# Patient Record
Sex: Male | Born: 1985 | Race: White | Hispanic: No | Marital: Single | Smoking: Former smoker
Health system: Southern US, Community
[De-identification: ages and names within clinical notes are randomized; demographics above are authoritative.]

## PROBLEM LIST (undated history)

## (undated) DIAGNOSIS — I609 Nontraumatic subarachnoid hemorrhage, unspecified: Secondary | ICD-10-CM

## (undated) DIAGNOSIS — S069X9A Unspecified intracranial injury with loss of consciousness of unspecified duration, initial encounter: Secondary | ICD-10-CM

## (undated) DIAGNOSIS — F32A Depression, unspecified: Secondary | ICD-10-CM

## (undated) DIAGNOSIS — D649 Anemia, unspecified: Secondary | ICD-10-CM

## (undated) DIAGNOSIS — R569 Unspecified convulsions: Secondary | ICD-10-CM

## (undated) DIAGNOSIS — M419 Scoliosis, unspecified: Secondary | ICD-10-CM

## (undated) DIAGNOSIS — B1920 Unspecified viral hepatitis C without hepatic coma: Secondary | ICD-10-CM

## (undated) DIAGNOSIS — F1910 Other psychoactive substance abuse, uncomplicated: Secondary | ICD-10-CM

## (undated) DIAGNOSIS — F419 Anxiety disorder, unspecified: Secondary | ICD-10-CM

## (undated) DIAGNOSIS — F329 Major depressive disorder, single episode, unspecified: Secondary | ICD-10-CM

## (undated) DIAGNOSIS — I2699 Other pulmonary embolism without acute cor pulmonale: Secondary | ICD-10-CM

## (undated) DIAGNOSIS — F29 Unspecified psychosis not due to a substance or known physiological condition: Secondary | ICD-10-CM

## (undated) DIAGNOSIS — A0472 Enterocolitis due to Clostridium difficile, not specified as recurrent: Secondary | ICD-10-CM

## (undated) HISTORY — DX: Depression, unspecified: F32.A

## (undated) HISTORY — PX: ANKLE SURGERY: SHX546

## (undated) HISTORY — PX: PEG TUBE PLACEMENT: SUR1034

## (undated) HISTORY — PX: ILEOSTOMY: SHX1783

## (undated) HISTORY — PX: IVC FILTER PLACEMENT (ARMC HX): HXRAD1551

## (undated) HISTORY — DX: Major depressive disorder, single episode, unspecified: F32.9

## (undated) HISTORY — PX: BRAIN SURGERY: SHX531

## (undated) HISTORY — PX: OTHER SURGICAL HISTORY: SHX169

## (undated) HISTORY — DX: Nontraumatic subarachnoid hemorrhage, unspecified: I60.9

## (undated) HISTORY — PX: TRACHEOSTOMY: SUR1362

## (undated) HISTORY — PX: FRACTURE SURGERY: SHX138

## (undated) HISTORY — DX: Other pulmonary embolism without acute cor pulmonale: I26.99

## (undated) HISTORY — DX: Unspecified intracranial injury with loss of consciousness of unspecified duration, initial encounter: S06.9X9A

---
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
## 2010-08-19 ENCOUNTER — Encounter: Payer: Self-pay | Admitting: Family Medicine

## 2011-05-23 ENCOUNTER — Emergency Department (HOSPITAL_BASED_OUTPATIENT_CLINIC_OR_DEPARTMENT_OTHER)
Admission: EM | Admit: 2011-05-23 | Discharge: 2011-05-24 | Disposition: A | Discharge status: Home / Self Care | Payer: Commercial Managed Care - PPO | Attending: Emergency Medicine | Admitting: Emergency Medicine

## 2011-05-23 ENCOUNTER — Encounter: Payer: Self-pay | Admitting: *Deleted

## 2011-05-23 ENCOUNTER — Emergency Department (INDEPENDENT_AMBULATORY_CARE_PROVIDER_SITE_OTHER): Payer: Commercial Managed Care - PPO

## 2011-05-23 DIAGNOSIS — Z79899 Other long term (current) drug therapy: Secondary | ICD-10-CM | POA: Insufficient documentation

## 2011-05-23 DIAGNOSIS — F17200 Nicotine dependence, unspecified, uncomplicated: Secondary | ICD-10-CM | POA: Insufficient documentation

## 2011-05-23 DIAGNOSIS — X58XXXA Exposure to other specified factors, initial encounter: Secondary | ICD-10-CM

## 2011-05-23 DIAGNOSIS — IMO0002 Reserved for concepts with insufficient information to code with codable children: Secondary | ICD-10-CM | POA: Insufficient documentation

## 2011-05-23 DIAGNOSIS — M25549 Pain in joints of unspecified hand: Secondary | ICD-10-CM

## 2011-05-23 HISTORY — DX: Scoliosis, unspecified: M41.9

## 2011-05-23 HISTORY — DX: Unspecified viral hepatitis C without hepatic coma: B19.20

## 2011-05-23 MED ORDER — LIDOCAINE HCL 2 % IJ SOLN
20.0000 mL | Freq: Once | INTRAMUSCULAR | Status: AC
  Administered 2011-05-23: 400 mg
  Filled 2011-05-23: qty 1

## 2011-05-23 NOTE — ED Provider Notes (Signed)
History     CSN: [id number] Arrival date & time: 05/23/2011 10:46 PM   First MD Initiated Contact with Patient 05/23/11 2258      Chief Complaint  Patient presents with  . Hand Pain    (Consider location/radiation/quality/duration/timing/severity/associated sxs/prior treatment) HPI Comments: Patient sustained a laceration to his right thumb 2 weeks ago and did not have it evaluated until last night at urgent care. He was given a shot of antibiotics and put on Tylenol and doxycycline. He states the pain got worse last night and is unable to tolerate it today. He denies any fevers, vomiting. His range of motion is limited secondary to pain. He denies any new injury.  The history is provided by the patient.    Past Medical History  Diagnosis Date  . Hepatitis C   . Scoliosis     History reviewed. No pertinent past surgical history.  No family history on file.  History  Substance Use Topics  . Smoking status: Current Everyday Smoker  . Smokeless tobacco: Not on file  . Alcohol Use: No      Review of Systems  Constitutional: Negative for activity change and appetite change.  HENT: Negative for congestion and rhinorrhea.   Respiratory: Negative for cough, chest tightness and shortness of breath.   Cardiovascular: Negative for chest pain.  Gastrointestinal: Negative for nausea, vomiting and abdominal pain.  Genitourinary: Negative for dysuria and hematuria.  Musculoskeletal: Positive for arthralgias. Negative for back pain.  Neurological: Negative for weakness and headaches.    Allergies  Review of patient's allergies indicates no known allergies.  Home Medications   Current Outpatient Rx  Name Route Sig Dispense Refill  . DOXYCYCLINE HYCLATE 100 MG PO TABS Oral Take 100 mg by mouth 2 (two) times daily.      Marland Kitchen GABAPENTIN 300 MG PO CAPS Oral Take 300 mg by mouth daily.      . IBUPROFEN 200 MG PO TABS Oral Take 600-800 mg by mouth 3 (three) times daily as needed. For  pain     . INTERFERON BETA-1A 30 MCG/0.5ML IM KIT Intramuscular Inject 30 mcg into the muscle every 7 (seven) days.      Marland Kitchen RIBAVIRIN 200 MG PO TABS Oral Take 600 mg by mouth 2 (two) times daily.      . TRAMADOL HCL 50 MG PO TABS Oral Take 50-100 mg by mouth 3 (three) times daily as needed. For pain. Maximum dose= 8 tablets per day     . OXYCODONE HCL 5 MG PO TABS Oral Take 1 tablet (5 mg total) by mouth every 4 (four) hours as needed for pain. 15 tablet 0    BP 131/74  Pulse 99  Temp(Src) 98.2 F (36.8 C) (Oral)  Resp 20  Ht 6' (1.829 m)  Wt 168 lb (76.204 kg)  BMI 22.78 kg/m2  SpO2 99%  Physical Exam  Constitutional: He is oriented to person, place, and time. He appears well-developed and well-nourished. No distress.  HENT:  Head: Normocephalic and atraumatic.  Mouth/Throat: No oropharyngeal exudate.  Eyes: Conjunctivae are normal. Pupils are equal, round, and reactive to light.  Neck: Normal range of motion.  Cardiovascular: Normal rate, regular rhythm and normal heart sounds.   Pulmonary/Chest: Effort normal and breath sounds normal. No respiratory distress.  Abdominal: Soft. There is no tenderness. There is no rebound and no guarding.  Musculoskeletal: Normal range of motion. He exhibits tenderness.       Pad of right thumb erythematous and tender  to palpation, to flex IP and MCP joint with pain. No appreciable fluctuance.   Neurological: He is alert and oriented to person, place, and time. No cranial nerve deficit.  Skin: Skin is warm.    ED Course  INCISION AND DRAINAGE Date/Time: 05/24/2011 12:27 AM Performed by: Glynn Octave Authorized by: Glynn Octave Consent: Verbal consent obtained. Risks and benefits: risks, benefits and alternatives were discussed Consent given by: patient Patient understanding: patient states understanding of the procedure being performed Site marked: the operative site was marked Patient identity confirmed: verbally with patient Time  out: Immediately prior to procedure a "time out" was called to verify the correct patient, procedure, equipment, support staff and site/side marked as required. Type: abscess Body area: upper extremity Location details: right thumb Anesthesia: digital block and local infiltration Local anesthetic: lidocaine 1% without epinephrine Anesthetic total: 10 ml Patient sedated: no Scalpel size: 11 Needle gauge: 22 Incision type: single straight Complexity: simple Drainage: purulent Drainage amount: moderate Wound treatment: wound left open Packing material: 1/4 in iodoform gauze Patient tolerance: Patient tolerated the procedure well with no immediate complications.   (including critical care time)  Labs Reviewed - No data to display Dg Hand Complete Right  05/23/2011  *RADIOLOGY REPORT*  Clinical Data: Trauma with pain.  RIGHT HAND - COMPLETE 3+ VIEW  Comparison: None.  Findings: No acute fracture or dislocation.  No significant soft tissue swelling.  IMPRESSION: Normal right hand.  Original Report Authenticated By: Consuello Bossier, M.D.     1. Felon       MDM  Right thumb pain and erythema with remote trauma. Concern for possible felon  Purulence expressed on I +D of felon.  Packing placed.  Instructed to continue antibiotics given at [facility].  Follow up with hand surgery prn.      Glynn Octave, MD 05/24/11 [phone]

## 2011-05-23 NOTE — ED Notes (Signed)
Pt reports he cut his right thumb 2 weeks ago - waited to see doctor but went last night and got a "shot" and was put on doxycycline and tramadol- c/o severe pain tonight

## 2011-05-24 MED ORDER — OXYCODONE HCL 5 MG PO TABS: 5.0000 mg | ORAL_TABLET | ORAL | Status: AC | PRN

## 2011-06-19 ENCOUNTER — Emergency Department (HOSPITAL_BASED_OUTPATIENT_CLINIC_OR_DEPARTMENT_OTHER)
Admission: EM | Admit: 2011-06-19 | Discharge: 2011-06-20 | Disposition: A | Discharge status: Home / Self Care | Payer: Commercial Managed Care - PPO | Attending: Emergency Medicine | Admitting: Emergency Medicine

## 2011-06-19 ENCOUNTER — Encounter (HOSPITAL_BASED_OUTPATIENT_CLINIC_OR_DEPARTMENT_OTHER): Payer: Self-pay | Admitting: *Deleted

## 2011-06-19 DIAGNOSIS — K047 Periapical abscess without sinus: Secondary | ICD-10-CM

## 2011-06-19 DIAGNOSIS — K044 Acute apical periodontitis of pulpal origin: Secondary | ICD-10-CM | POA: Insufficient documentation

## 2011-06-19 DIAGNOSIS — Z79899 Other long term (current) drug therapy: Secondary | ICD-10-CM | POA: Insufficient documentation

## 2011-06-19 DIAGNOSIS — IMO0002 Reserved for concepts with insufficient information to code with codable children: Secondary | ICD-10-CM | POA: Insufficient documentation

## 2011-06-19 DIAGNOSIS — L0291 Cutaneous abscess, unspecified: Secondary | ICD-10-CM

## 2011-06-19 NOTE — ED Notes (Signed)
Pt has an abscess under his left arm x2 days and is c/o dental pain.

## 2011-06-20 MED ORDER — OXYCODONE HCL 5 MG PO TABA: 5.0000 mg | ORAL_TABLET | Freq: Three times a day (TID) | ORAL | Status: DC | PRN

## 2011-06-20 MED ORDER — HYDROMORPHONE HCL PF 1 MG/ML IJ SOLN
1.0000 mg | Freq: Once | INTRAMUSCULAR | Status: DC
  Administered 2011-06-20 (×2): 1 mg via INTRAVENOUS
  Filled 2011-06-20: qty 1

## 2011-06-20 MED ORDER — OXYCODONE-ACETAMINOPHEN 5-325 MG PO TABS: 1.0000 | ORAL_TABLET | Freq: Four times a day (QID) | ORAL | Status: DC | PRN

## 2011-06-20 MED ORDER — HYDROMORPHONE HCL PF 2 MG/ML IJ SOLN: 2.0000 mg | Freq: Once | INTRAMUSCULAR | Status: DC

## 2011-06-20 MED ORDER — LIDOCAINE HCL (PF) 1 % IJ SOLN
INTRAMUSCULAR | Status: AC
  Administered 2011-06-20: 5 mL
  Filled 2011-06-20: qty 5

## 2011-06-20 MED ORDER — LIDOCAINE HCL (PF) 1 % IJ SOLN
5.0000 mL | Freq: Once | INTRAMUSCULAR | Status: AC
  Administered 2011-06-20: 5 mL

## 2011-06-20 MED ORDER — HYDROMORPHONE HCL PF 1 MG/ML IJ SOLN
INTRAMUSCULAR | Status: AC
  Administered 2011-06-20: 1 mg via INTRAVENOUS
  Filled 2011-06-20: qty 1

## 2011-06-20 NOTE — ED Notes (Addendum)
Original order was for 1mg  Dilaudid IM. While administering that order, the order was changed to 2mg  Dilaudid IM. So a second dose of 1mg  Dilaudid was given IM.

## 2011-06-20 NOTE — ED Notes (Signed)
MD at bedside. 

## 2011-06-20 NOTE — ED Provider Notes (Signed)
History     CSN: [id number] Arrival date & time: 06/19/2011 10:38 PM   First MD Initiated Contact with Patient 06/19/11 2332      Chief Complaint  Patient presents with  . Abscess  . Dental Pain    (Consider location/radiation/quality/duration/timing/severity/associated sxs/prior treatment) Patient is a 25 y.o. male presenting with abscess and tooth pain. The history is provided by the patient.  Abscess  This is a new problem. The current episode started less than one week ago. The onset was gradual. The problem occurs continuously. The problem has been gradually worsening. Affected Location: left axilla. The problem is moderate. The abscess is characterized by redness, painfulness and swelling. It is unknown what he was exposed to. The abscess first occurred at home. Pertinent negatives include no fever and no sore throat. There were no sick contacts. Recently, medical care has been given by the PCP. Services received include medications given (started on doxy yesterday).  Dental PainThe primary symptoms include mouth pain. Primary symptoms do not include oral bleeding, fever, shortness of breath or sore throat. The symptoms began 3 to 5 days ago. The symptoms are worsening. The symptoms are new. The symptoms occur constantly.  Additional symptoms include: dental sensitivity to temperature, gum swelling, gum tenderness and taste disturbance. Associated symptoms comments: Had tooth pulled and persistent swelling and pain since.    Past Medical History  Diagnosis Date  . Hepatitis C   . Scoliosis     History reviewed. No pertinent past surgical history.  No family history on file.  History  Substance Use Topics  . Smoking status: Current Everyday Smoker  . Smokeless tobacco: Not on file  . Alcohol Use: No      Review of Systems  Constitutional: Negative for fever.  HENT: Negative for sore throat.   Respiratory: Negative for shortness of breath.   All other systems  reviewed and are negative.    Allergies  Review of patient's allergies indicates no known allergies.  Home Medications   Current Outpatient Rx  Name Route Sig Dispense Refill  . CYANOCOBALAMIN 100 MCG PO TABS Oral Take 100 mcg by mouth daily.      Marland Kitchen DOXYCYCLINE HYCLATE 100 MG PO TABS Oral Take 100 mg by mouth 2 (two) times daily.      Marland Kitchen GABAPENTIN 300 MG PO CAPS Oral Take 300 mg by mouth daily.      . IBUPROFEN 200 MG PO TABS Oral Take 600-800 mg by mouth 3 (three) times daily as needed. For pain     . INTERFERON BETA-1A 30 MCG/0.5ML IM KIT Intramuscular Inject 30 mcg into the muscle every 7 (seven) days.      . OXYCODONE HCL (ABUSE DETER) 5 MG PO TABS Oral Take 5 mg by mouth 3 (three) times daily as needed (for pain). 20 tablet 0  . RIBAVIRIN 200 MG PO TABS Oral Take 600 mg by mouth 2 (two) times daily.      . TRAMADOL HCL 50 MG PO TABS Oral Take 50-100 mg by mouth 3 (three) times daily as needed. For pain. Maximum dose= 8 tablets per day       BP 132/74  Pulse 98  Temp(Src) 97.5 F (36.4 C) (Oral)  Resp 18  SpO2 100%  Physical Exam  Nursing note and vitals reviewed. Constitutional: He is oriented to person, place, and time. He appears well-developed and well-nourished. No distress.  HENT:  Head: Normocephalic and atraumatic.  Mouth/Throat: Oropharynx is clear and moist. Abnormal dentition.  No dental abscesses or uvula swelling.       Swelling of the right lower gums and jaw but no fluctuance or induration noted  Eyes: Conjunctivae and EOM are normal. Pupils are equal, round, and reactive to light.  Neck: Normal range of motion. Neck supple.  Cardiovascular: Normal rate, regular rhythm and intact distal pulses.   No murmur heard. Pulmonary/Chest: Effort normal and breath sounds normal. No respiratory distress. He has no wheezes. He has no rales.  Abdominal: Soft. He exhibits no distension. There is no tenderness. There is no rebound and no guarding.  Musculoskeletal: Normal  range of motion. He exhibits tenderness. He exhibits no edema.       Arms: Neurological: He is alert and oriented to person, place, and time.  Skin: Skin is warm and dry. No rash noted. No erythema.  Psychiatric: He has a normal mood and affect. His behavior is normal.    ED Course  Procedures (including critical care time)  Labs Reviewed - No data to display No results found.  INCISION AND DRAINAGE Performed by: Gwyneth Sprout Consent: Verbal consent obtained. Risks and benefits: risks, benefits and alternatives were discussed Type: abscess  Body area: left axilla  Anesthesia: local infiltration  Local anesthetic: lidocaine 1% without epinephrine  Anesthetic total: 4 ml  Complexity: complex Blunt dissection to break up loculations  Drainage: purulent  Drainage amount: 3mL  Packing material: 1/4 in iodoform gauze  Patient tolerance: Patient tolerated the procedure well with no immediate complications.     1. Abscess   2. Dental infection       MDM  Pt with 2 issues.  First is abscess in his axilla with surrounding erythema and drained as above.  Pt already on doxy which he started yesterday. Secondly had dental extraction 4 days ago with persistent pain and swelling of the jaw and gums and pain.  No signs of abscess and no drainage from site.  Will have continue doxy and f/u with dentist.        Gwyneth Sprout, MD 06/20/11 [phone]

## 2011-07-30 DIAGNOSIS — S069X9A Unspecified intracranial injury with loss of consciousness of unspecified duration, initial encounter: Secondary | ICD-10-CM

## 2011-07-30 DIAGNOSIS — S069XAA Unspecified intracranial injury with loss of consciousness status unknown, initial encounter: Secondary | ICD-10-CM

## 2011-07-30 HISTORY — DX: Unspecified intracranial injury with loss of consciousness status unknown, initial encounter: S06.9XAA

## 2011-07-30 HISTORY — DX: Unspecified intracranial injury with loss of consciousness of unspecified duration, initial encounter: S06.9X9A

## 2011-12-25 ENCOUNTER — Ambulatory Visit: Payer: BC Managed Care – PPO | Admitting: Speech Pathology

## 2011-12-25 ENCOUNTER — Ambulatory Visit: Payer: BC Managed Care – PPO | Attending: Physical Medicine and Rehabilitation | Admitting: Occupational Therapy

## 2011-12-25 ENCOUNTER — Ambulatory Visit: Payer: BC Managed Care – PPO | Admitting: Physical Therapy

## 2011-12-25 DIAGNOSIS — R41841 Cognitive communication deficit: Secondary | ICD-10-CM | POA: Insufficient documentation

## 2011-12-25 DIAGNOSIS — M629 Disorder of muscle, unspecified: Secondary | ICD-10-CM | POA: Insufficient documentation

## 2011-12-25 DIAGNOSIS — R490 Dysphonia: Secondary | ICD-10-CM | POA: Insufficient documentation

## 2011-12-25 DIAGNOSIS — R1312 Dysphagia, oropharyngeal phase: Secondary | ICD-10-CM | POA: Insufficient documentation

## 2011-12-25 DIAGNOSIS — M2560 Stiffness of unspecified joint, not elsewhere classified: Secondary | ICD-10-CM | POA: Insufficient documentation

## 2011-12-25 DIAGNOSIS — M2420 Disorder of ligament, unspecified site: Secondary | ICD-10-CM | POA: Insufficient documentation

## 2011-12-25 DIAGNOSIS — M2550 Pain in unspecified joint: Secondary | ICD-10-CM | POA: Insufficient documentation

## 2011-12-25 DIAGNOSIS — Z5189 Encounter for other specified aftercare: Secondary | ICD-10-CM | POA: Insufficient documentation

## 2011-12-30 ENCOUNTER — Encounter: Payer: Self-pay | Admitting: Physical Medicine & Rehabilitation

## 2012-01-01 ENCOUNTER — Encounter: Payer: BC Managed Care – PPO | Admitting: Occupational Therapy

## 2012-01-01 ENCOUNTER — Encounter: Payer: BC Managed Care – PPO | Admitting: Speech Pathology

## 2012-01-01 ENCOUNTER — Ambulatory Visit: Payer: BC Managed Care – PPO | Admitting: Physical Therapy

## 2012-01-03 ENCOUNTER — Ambulatory Visit: Payer: BC Managed Care – PPO | Admitting: Physical Therapy

## 2012-01-03 ENCOUNTER — Encounter: Payer: BC Managed Care – PPO | Admitting: Occupational Therapy

## 2012-01-08 ENCOUNTER — Ambulatory Visit: Payer: BC Managed Care – PPO | Admitting: Occupational Therapy

## 2012-01-08 ENCOUNTER — Encounter: Payer: BC Managed Care – PPO | Admitting: Occupational Therapy

## 2012-01-08 ENCOUNTER — Encounter: Payer: BC Managed Care – PPO | Admitting: Speech Pathology

## 2012-01-08 ENCOUNTER — Ambulatory Visit: Payer: BC Managed Care – PPO | Admitting: Physical Therapy

## 2012-01-08 ENCOUNTER — Ambulatory Visit: Payer: BC Managed Care – PPO | Attending: Physical Medicine and Rehabilitation | Admitting: Speech Pathology

## 2012-01-08 DIAGNOSIS — R1312 Dysphagia, oropharyngeal phase: Secondary | ICD-10-CM | POA: Insufficient documentation

## 2012-01-08 DIAGNOSIS — M2420 Disorder of ligament, unspecified site: Secondary | ICD-10-CM | POA: Insufficient documentation

## 2012-01-08 DIAGNOSIS — R41841 Cognitive communication deficit: Secondary | ICD-10-CM | POA: Insufficient documentation

## 2012-01-08 DIAGNOSIS — M2560 Stiffness of unspecified joint, not elsewhere classified: Secondary | ICD-10-CM | POA: Insufficient documentation

## 2012-01-08 DIAGNOSIS — M629 Disorder of muscle, unspecified: Secondary | ICD-10-CM | POA: Insufficient documentation

## 2012-01-08 DIAGNOSIS — R490 Dysphonia: Secondary | ICD-10-CM | POA: Insufficient documentation

## 2012-01-08 DIAGNOSIS — Z5189 Encounter for other specified aftercare: Secondary | ICD-10-CM | POA: Insufficient documentation

## 2012-01-08 DIAGNOSIS — M2550 Pain in unspecified joint: Secondary | ICD-10-CM | POA: Insufficient documentation

## 2012-01-10 ENCOUNTER — Ambulatory Visit: Payer: BC Managed Care – PPO

## 2012-01-10 ENCOUNTER — Ambulatory Visit: Payer: BC Managed Care – PPO | Admitting: Rehabilitative and Restorative Service Providers"

## 2012-01-10 ENCOUNTER — Encounter: Payer: BC Managed Care – PPO | Admitting: Occupational Therapy

## 2012-01-14 ENCOUNTER — Ambulatory Visit: Payer: BC Managed Care – PPO | Admitting: Physical Therapy

## 2012-01-14 ENCOUNTER — Ambulatory Visit: Payer: BC Managed Care – PPO | Admitting: Occupational Therapy

## 2012-01-14 ENCOUNTER — Ambulatory Visit: Payer: BC Managed Care – PPO

## 2012-01-16 ENCOUNTER — Ambulatory Visit: Payer: BC Managed Care – PPO | Admitting: Physical Therapy

## 2012-01-16 ENCOUNTER — Ambulatory Visit: Payer: BC Managed Care – PPO | Admitting: Occupational Therapy

## 2012-01-21 ENCOUNTER — Ambulatory Visit: Payer: BC Managed Care – PPO | Admitting: Occupational Therapy

## 2012-01-21 ENCOUNTER — Ambulatory Visit: Payer: BC Managed Care – PPO | Admitting: Speech Pathology

## 2012-01-21 ENCOUNTER — Ambulatory Visit: Payer: BC Managed Care – PPO | Admitting: Physical Therapy

## 2012-01-23 ENCOUNTER — Ambulatory Visit: Payer: BC Managed Care – PPO | Admitting: Physical Therapy

## 2012-01-23 ENCOUNTER — Ambulatory Visit: Payer: BC Managed Care – PPO | Admitting: Occupational Therapy

## 2012-01-23 ENCOUNTER — Ambulatory Visit: Payer: BC Managed Care – PPO | Admitting: Speech Pathology

## 2012-01-27 ENCOUNTER — Ambulatory Visit: Payer: BC Managed Care – PPO | Admitting: Speech Pathology

## 2012-01-27 ENCOUNTER — Ambulatory Visit: Payer: BC Managed Care – PPO | Attending: Physical Medicine and Rehabilitation | Admitting: Occupational Therapy

## 2012-01-27 ENCOUNTER — Ambulatory Visit: Payer: BC Managed Care – PPO | Admitting: Physical Therapy

## 2012-01-27 DIAGNOSIS — Z5189 Encounter for other specified aftercare: Secondary | ICD-10-CM | POA: Insufficient documentation

## 2012-01-27 DIAGNOSIS — R41841 Cognitive communication deficit: Secondary | ICD-10-CM | POA: Insufficient documentation

## 2012-01-27 DIAGNOSIS — R490 Dysphonia: Secondary | ICD-10-CM | POA: Insufficient documentation

## 2012-01-27 DIAGNOSIS — M2420 Disorder of ligament, unspecified site: Secondary | ICD-10-CM | POA: Insufficient documentation

## 2012-01-27 DIAGNOSIS — M2550 Pain in unspecified joint: Secondary | ICD-10-CM | POA: Insufficient documentation

## 2012-01-27 DIAGNOSIS — M2560 Stiffness of unspecified joint, not elsewhere classified: Secondary | ICD-10-CM | POA: Insufficient documentation

## 2012-01-27 DIAGNOSIS — M629 Disorder of muscle, unspecified: Secondary | ICD-10-CM | POA: Insufficient documentation

## 2012-01-27 DIAGNOSIS — R1312 Dysphagia, oropharyngeal phase: Secondary | ICD-10-CM | POA: Insufficient documentation

## 2012-01-31 ENCOUNTER — Ambulatory Visit: Payer: BC Managed Care – PPO | Admitting: Physical Therapy

## 2012-01-31 ENCOUNTER — Ambulatory Visit: Payer: BC Managed Care – PPO | Admitting: Occupational Therapy

## 2012-01-31 ENCOUNTER — Ambulatory Visit: Payer: BC Managed Care – PPO

## 2012-02-03 ENCOUNTER — Encounter: Payer: Self-pay | Admitting: Physical Medicine and Rehabilitation

## 2012-02-04 ENCOUNTER — Encounter: Payer: Self-pay | Admitting: Physical Medicine & Rehabilitation

## 2012-02-04 ENCOUNTER — Encounter
Payer: BC Managed Care – PPO | Attending: Physical Medicine & Rehabilitation | Admitting: Physical Medicine & Rehabilitation

## 2012-02-04 VITALS — BP 131/68 | HR 86 | Resp 16 | Ht 76.0 in | Wt 165.0 lb

## 2012-02-04 DIAGNOSIS — S069X9A Unspecified intracranial injury with loss of consciousness of unspecified duration, initial encounter: Secondary | ICD-10-CM

## 2012-02-04 DIAGNOSIS — G8250 Quadriplegia, unspecified: Secondary | ICD-10-CM | POA: Insufficient documentation

## 2012-02-04 DIAGNOSIS — M948X9 Other specified disorders of cartilage, unspecified sites: Secondary | ICD-10-CM

## 2012-02-04 DIAGNOSIS — S069XAA Unspecified intracranial injury with loss of consciousness status unknown, initial encounter: Secondary | ICD-10-CM | POA: Insufficient documentation

## 2012-02-04 DIAGNOSIS — Q068 Other specified congenital malformations of spinal cord: Secondary | ICD-10-CM | POA: Insufficient documentation

## 2012-02-04 DIAGNOSIS — M898X9 Other specified disorders of bone, unspecified site: Secondary | ICD-10-CM

## 2012-02-04 DIAGNOSIS — S069X0A Unspecified intracranial injury without loss of consciousness, initial encounter: Secondary | ICD-10-CM | POA: Insufficient documentation

## 2012-02-04 DIAGNOSIS — K592 Neurogenic bowel, not elsewhere classified: Secondary | ICD-10-CM | POA: Insufficient documentation

## 2012-02-04 DIAGNOSIS — X58XXXA Exposure to other specified factors, initial encounter: Secondary | ICD-10-CM | POA: Insufficient documentation

## 2012-02-04 DIAGNOSIS — N319 Neuromuscular dysfunction of bladder, unspecified: Secondary | ICD-10-CM | POA: Insufficient documentation

## 2012-02-04 MED ORDER — PREGABALIN 75 MG PO CAPS: 75.0000 mg | ORAL_CAPSULE | Freq: Three times a day (TID) | ORAL | Status: DC

## 2012-02-04 MED ORDER — NORTRIPTYLINE HCL 50 MG PO CAPS: 100.0000 mg | ORAL_CAPSULE | Freq: Every day | ORAL | Status: DC

## 2012-02-04 NOTE — Patient Instructions (Signed)
Continue with therapies as directed
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
## 2012-02-27 ENCOUNTER — Encounter: Payer: Self-pay | Admitting: Physical Medicine and Rehabilitation

## 2012-03-04 ENCOUNTER — Encounter: Payer: BC Managed Care – PPO | Admitting: Physical Medicine & Rehabilitation

## 2012-04-01 ENCOUNTER — Encounter: Payer: BC Managed Care – PPO | Admitting: Physical Medicine & Rehabilitation

## 2012-11-16 ENCOUNTER — Emergency Department: Payer: Self-pay | Admitting: Emergency Medicine

## 2012-11-20 ENCOUNTER — Emergency Department: Payer: Self-pay | Admitting: Emergency Medicine

## 2012-11-20 LAB — BASIC METABOLIC PANEL
Anion Gap: 5 — ABNORMAL LOW (ref 7–16)
BUN: 20 mg/dL — ABNORMAL HIGH (ref 7–18)
Chloride: 103 mmol/L (ref 98–107)
EGFR (African American): >60
EGFR (Non-African Amer.): >60
Potassium: 4 mmol/L (ref 3.5–5.1)
Sodium: 139 mmol/L (ref 136–145)

## 2012-11-20 LAB — URINALYSIS, COMPLETE
Glucose,UR: NEGATIVE mg/dL (ref 0–75)
Ketone: NEGATIVE
Nitrite: NEGATIVE
Ph: 6 (ref 4.5–8.0)
RBC,UR: 62 /HPF (ref 0–5)
Squamous Epithelial: <1
WBC UR: 5 /HPF (ref 0–5)

## 2012-11-20 LAB — CBC
HCT: 42.2 % (ref 40.0–52.0)
HGB: 13.6 g/dL (ref 13.0–18.0)
MCH: 27.8 pg (ref 26.0–34.0)
MCV: 86 fL (ref 80–100)
Platelet: 208 10*3/uL (ref 150–440)

## 2012-11-22 ENCOUNTER — Emergency Department: Payer: Self-pay | Admitting: Emergency Medicine

## 2012-11-29 ENCOUNTER — Emergency Department: Payer: Self-pay | Admitting: Internal Medicine

## 2012-11-29 LAB — URINALYSIS, COMPLETE
Bacteria: NONE SEEN
Glucose,UR: NEGATIVE mg/dL (ref 0–75)
Nitrite: NEGATIVE
Ph: 6 (ref 4.5–8.0)
Protein: NEGATIVE
RBC,UR: 26 /HPF (ref 0–5)
Specific Gravity: 1.012 (ref 1.003–1.030)
Squamous Epithelial: NONE SEEN

## 2012-11-29 LAB — BASIC METABOLIC PANEL
Anion Gap: 5 — ABNORMAL LOW (ref 7–16)
BUN: 21 mg/dL — ABNORMAL HIGH (ref 7–18)
Chloride: 103 mmol/L (ref 98–107)
Co2: 29 mmol/L (ref 21–32)
Creatinine: 0.62 mg/dL (ref 0.60–1.30)
EGFR (African American): >60
Glucose: 83 mg/dL (ref 65–99)
Sodium: 137 mmol/L (ref 136–145)

## 2012-11-29 LAB — CBC
HCT: 41.6 % (ref 40.0–52.0)
MCHC: 34 g/dL (ref 32.0–36.0)
MCV: 83 fL (ref 80–100)
Platelet: 294 10*3/uL (ref 150–440)

## 2012-11-30 ENCOUNTER — Emergency Department: Payer: Self-pay | Admitting: Emergency Medicine

## 2012-11-30 LAB — CBC
HCT: 44.5 % (ref 40.0–52.0)
MCH: 27.9 pg (ref 26.0–34.0)
MCHC: 33.3 g/dL (ref 32.0–36.0)
Platelet: 264 10*3/uL (ref 150–440)
RBC: 5.31 10*6/uL (ref 4.40–5.90)
RDW: 14.7 % — ABNORMAL HIGH (ref 11.5–14.5)

## 2012-11-30 LAB — CK: CK, Total: 165 U/L (ref 35–232)

## 2012-11-30 LAB — URINALYSIS, COMPLETE
Bilirubin,UR: NEGATIVE
Nitrite: POSITIVE
Ph: 6 (ref 4.5–8.0)
Protein: NEGATIVE
Specific Gravity: 1.008 (ref 1.003–1.030)
Squamous Epithelial: NONE SEEN

## 2012-11-30 LAB — COMPREHENSIVE METABOLIC PANEL
Albumin: 4.2 g/dL (ref 3.4–5.0)
Anion Gap: 5 — ABNORMAL LOW (ref 7–16)
BUN: 21 mg/dL — ABNORMAL HIGH (ref 7–18)
Bilirubin,Total: 0.6 mg/dL (ref 0.2–1.0)
Calcium, Total: 9.4 mg/dL (ref 8.5–10.1)
Co2: 29 mmol/L (ref 21–32)
Creatinine: 0.61 mg/dL (ref 0.60–1.30)
EGFR (African American): >60
EGFR (Non-African Amer.): >60
Glucose: 91 mg/dL (ref 65–99)
Potassium: 4.5 mmol/L (ref 3.5–5.1)
SGPT (ALT): 43 U/L (ref 12–78)
Sodium: 137 mmol/L (ref 136–145)
Total Protein: 8.5 g/dL — ABNORMAL HIGH (ref 6.4–8.2)

## 2012-12-02 LAB — URINE CULTURE

## 2012-12-06 LAB — CULTURE, BLOOD (SINGLE)

## 2012-12-20 ENCOUNTER — Emergency Department: Payer: Self-pay | Admitting: Emergency Medicine

## 2013-01-07 ENCOUNTER — Emergency Department: Payer: Self-pay | Admitting: Emergency Medicine

## 2013-02-25 ENCOUNTER — Other Ambulatory Visit: Payer: Self-pay | Admitting: Physical Medicine & Rehabilitation

## 2013-03-27 ENCOUNTER — Emergency Department: Payer: Self-pay | Admitting: Emergency Medicine

## 2013-03-28 LAB — COMPREHENSIVE METABOLIC PANEL
Albumin: 3.7 g/dL (ref 3.4–5.0)
Chloride: 105 mmol/L (ref 98–107)
Co2: 32 mmol/L (ref 21–32)
Glucose: 101 mg/dL — ABNORMAL HIGH (ref 65–99)
Osmolality: 280 (ref 275–301)
SGOT(AST): 16 U/L (ref 15–37)
SGPT (ALT): 13 U/L (ref 12–78)
Sodium: 139 mmol/L (ref 136–145)
Total Protein: 8 g/dL (ref 6.4–8.2)

## 2013-03-28 LAB — URINALYSIS, COMPLETE
Bacteria: NONE SEEN
Bilirubin,UR: NEGATIVE
Glucose,UR: NEGATIVE mg/dL (ref 0–75)
RBC,UR: 10 /HPF (ref 0–5)
Specific Gravity: 1.026 (ref 1.003–1.030)
Squamous Epithelial: NONE SEEN
WBC UR: 10 /HPF (ref 0–5)

## 2013-03-28 LAB — MAGNESIUM: Magnesium: 1.7 mg/dL — ABNORMAL LOW

## 2013-03-28 LAB — PROTIME-INR: INR: 1

## 2013-03-28 LAB — TROPONIN I: Troponin-I: <0.02 ng/mL

## 2013-03-28 LAB — CBC WITH DIFFERENTIAL/PLATELET
Basophil %: 0.7 %
Eosinophil #: 0.1 10*3/uL (ref 0.0–0.7)
Eosinophil %: 1.4 %
Lymphocyte %: 13.4 %
MCHC: 33.6 g/dL (ref 32.0–36.0)
Monocyte %: 4.8 %
Neutrophil #: 8.1 10*3/uL — ABNORMAL HIGH (ref 1.4–6.5)
RBC: 4.64 10*6/uL (ref 4.40–5.90)
WBC: 10.1 10*3/uL (ref 3.8–10.6)

## 2013-03-28 LAB — PHOSPHORUS: Phosphorus: 3.5 mg/dL (ref 2.5–4.9)

## 2013-03-28 LAB — TSH: Thyroid Stimulating Horm: 1.84 u[IU]/mL

## 2013-03-29 ENCOUNTER — Observation Stay: Payer: Self-pay | Admitting: Internal Medicine

## 2013-03-29 ENCOUNTER — Ambulatory Visit: Payer: Self-pay | Admitting: Internal Medicine

## 2013-04-02 LAB — CULTURE, BLOOD (SINGLE)

## 2013-04-03 LAB — CBC WITH DIFFERENTIAL/PLATELET
Basophil #: 0.1 10*3/uL (ref 0.0–0.1)
Eosinophil %: 4.2 %
HCT: 44.3 % (ref 40.0–52.0)
HGB: 14.5 g/dL (ref 13.0–18.0)
Lymphocyte #: 2.3 10*3/uL (ref 1.0–3.6)
Lymphocyte %: 35.6 %
MCH: 28 pg (ref 26.0–34.0)
MCV: 85 fL (ref 80–100)
Neutrophil %: 51.1 %
RBC: 5.2 10*6/uL (ref 4.40–5.90)
RDW: 15.3 % — ABNORMAL HIGH (ref 11.5–14.5)
WBC: 6.5 10*3/uL (ref 3.8–10.6)

## 2013-04-03 LAB — BASIC METABOLIC PANEL
Anion Gap: 4 — ABNORMAL LOW (ref 7–16)
Chloride: 104 mmol/L (ref 98–107)
Co2: 31 mmol/L (ref 21–32)
EGFR (African American): >60
Glucose: 89 mg/dL (ref 65–99)
Potassium: 4.1 mmol/L (ref 3.5–5.1)
Sodium: 139 mmol/L (ref 136–145)

## 2013-04-05 LAB — PROTIME-INR
INR: 0.9
Prothrombin Time: 12.1 s

## 2013-04-06 LAB — BASIC METABOLIC PANEL
BUN: 17 mg/dL (ref 7–18)
Calcium, Total: 8.7 mg/dL (ref 8.5–10.1)
Co2: 31 mmol/L (ref 21–32)
EGFR (Non-African Amer.): >60
Osmolality: 278 (ref 275–301)

## 2013-04-07 LAB — COMPREHENSIVE METABOLIC PANEL
Albumin: 3.1 g/dL — ABNORMAL LOW (ref 3.4–5.0)
Alkaline Phosphatase: 88 U/L (ref 50–136)
BUN: 16 mg/dL (ref 7–18)
Bilirubin,Total: 0.4 mg/dL (ref 0.2–1.0)
Calcium, Total: 8.8 mg/dL (ref 8.5–10.1)
Chloride: 107 mmol/L (ref 98–107)
Co2: 26 mmol/L (ref 21–32)
EGFR (Non-African Amer.): >60
Glucose: 87 mg/dL (ref 65–99)
Osmolality: 276 (ref 275–301)
SGOT(AST): 32 U/L (ref 15–37)
SGPT (ALT): 13 U/L (ref 12–78)
Sodium: 138 mmol/L (ref 136–145)
Total Protein: 7.5 g/dL (ref 6.4–8.2)

## 2013-04-07 LAB — CBC WITH DIFFERENTIAL/PLATELET
Basophil #: 0.1 10*3/uL (ref 0.0–0.1)
Eosinophil #: 0.1 10*3/uL (ref 0.0–0.7)
Eosinophil %: 1.6 %
HCT: 40.4 % (ref 40.0–52.0)
Lymphocyte #: 2.4 10*3/uL (ref 1.0–3.6)
Lymphocyte %: 32.8 %
MCHC: 32.8 g/dL (ref 32.0–36.0)
MCV: 85 fL (ref 80–100)
Monocyte #: 0.9 x10 3/mm (ref 0.2–1.0)
Neutrophil #: 3.8 10*3/uL (ref 1.4–6.5)
Neutrophil %: 52.6 %
Platelet: 165 10*3/uL (ref 150–440)
WBC: 7.3 10*3/uL (ref 3.8–10.6)

## 2013-04-21 LAB — PROTIME-INR
INR: 1.1
Prothrombin Time: 14.3 secs (ref 11.5–14.7)

## 2013-04-21 LAB — COMPREHENSIVE METABOLIC PANEL
Anion Gap: 4 — ABNORMAL LOW (ref 7–16)
BUN: 17 mg/dL (ref 7–18)
Bilirubin,Total: 0.3 mg/dL (ref 0.2–1.0)
Creatinine: 0.64 mg/dL (ref 0.60–1.30)
EGFR (Non-African Amer.): >60
Glucose: 87 mg/dL (ref 65–99)
SGPT (ALT): 10 U/L — ABNORMAL LOW (ref 12–78)
Sodium: 139 mmol/L (ref 136–145)
Total Protein: 7.7 g/dL (ref 6.4–8.2)

## 2013-04-21 LAB — URINALYSIS, COMPLETE
Bilirubin,UR: NEGATIVE
Blood: NEGATIVE
Glucose,UR: NEGATIVE mg/dL (ref 0–75)
Leukocyte Esterase: NEGATIVE
Nitrite: NEGATIVE
RBC,UR: 11 /HPF (ref 0–5)
Squamous Epithelial: 1

## 2013-04-21 LAB — CBC WITH DIFFERENTIAL/PLATELET
Basophil #: 0.1 10*3/uL (ref 0.0–0.1)
Eosinophil #: 0.2 10*3/uL (ref 0.0–0.7)
HCT: 35.1 % — ABNORMAL LOW (ref 40.0–52.0)
HGB: 11.6 g/dL — ABNORMAL LOW (ref 13.0–18.0)
Lymphocyte #: 2 10*3/uL (ref 1.0–3.6)
Lymphocyte %: 23.5 %
MCH: 27.9 pg (ref 26.0–34.0)
MCHC: 33 g/dL (ref 32.0–36.0)
MCV: 85 fL (ref 80–100)
Neutrophil #: 5.4 10*3/uL (ref 1.4–6.5)
Neutrophil %: 62.9 %
Platelet: 305 10*3/uL (ref 150–440)
RDW: 15.8 % — ABNORMAL HIGH (ref 11.5–14.5)
WBC: 8.6 10*3/uL (ref 3.8–10.6)

## 2013-04-22 ENCOUNTER — Inpatient Hospital Stay: Payer: Self-pay | Admitting: Student

## 2013-04-24 LAB — URINALYSIS, COMPLETE
Bilirubin,UR: NEGATIVE
Blood: NEGATIVE
Ketone: NEGATIVE
Leukocyte Esterase: NEGATIVE
Nitrite: NEGATIVE
RBC,UR: NONE SEEN /HPF (ref 0–5)
Specific Gravity: 1.009 (ref 1.003–1.030)
Squamous Epithelial: NONE SEEN
WBC UR: NONE SEEN /HPF (ref 0–5)

## 2013-04-24 LAB — WBC: WBC: 8.5 10*3/uL (ref 3.8–10.6)

## 2013-04-28 ENCOUNTER — Ambulatory Visit: Payer: Self-pay | Admitting: Internal Medicine

## 2013-05-13 ENCOUNTER — Other Ambulatory Visit: Payer: Self-pay | Admitting: Family Medicine

## 2013-05-13 LAB — URINALYSIS, COMPLETE
Glucose,UR: NEGATIVE mg/dL (ref 0–75)
Hyaline Cast: 3
Nitrite: POSITIVE
Protein: 30
RBC,UR: 383 /HPF (ref 0–5)
Squamous Epithelial: 1
WBC UR: 60 /HPF (ref 0–5)

## 2013-05-13 LAB — CBC WITH DIFFERENTIAL/PLATELET
Basophil #: 0.1 10*3/uL (ref 0.0–0.1)
Eosinophil %: 3.3 %
HGB: 13.8 g/dL (ref 13.0–18.0)
Lymphocyte #: 2 10*3/uL (ref 1.0–3.6)
MCH: 27.7 pg (ref 26.0–34.0)
MCV: 84 fL (ref 80–100)
Monocyte #: 0.6 x10 3/mm (ref 0.2–1.0)
Neutrophil #: 3.9 10*3/uL (ref 1.4–6.5)
Neutrophil %: 57.3 %
RBC: 5 10*6/uL (ref 4.40–5.90)
WBC: 6.8 10*3/uL (ref 3.8–10.6)

## 2013-05-14 LAB — URINE CULTURE

## 2013-06-06 ENCOUNTER — Emergency Department: Payer: Self-pay | Admitting: Emergency Medicine

## 2013-06-06 LAB — COMPREHENSIVE METABOLIC PANEL
Albumin: 3.9 g/dL (ref 3.4–5.0)
Alkaline Phosphatase: 95 U/L (ref 50–136)
Anion Gap: 5 — ABNORMAL LOW (ref 7–16)
BUN: 20 mg/dL — ABNORMAL HIGH (ref 7–18)
Calcium, Total: 9 mg/dL (ref 8.5–10.1)
Chloride: 103 mmol/L (ref 98–107)
EGFR (African American): >60
EGFR (Non-African Amer.): >60
Glucose: 123 mg/dL — ABNORMAL HIGH (ref 65–99)
Potassium: 4.1 mmol/L (ref 3.5–5.1)
SGOT(AST): 20 U/L (ref 15–37)
Total Protein: 7.9 g/dL (ref 6.4–8.2)

## 2013-06-06 LAB — URINALYSIS, COMPLETE
Leukocyte Esterase: NEGATIVE
RBC,UR: 6 /HPF (ref 0–5)
WBC UR: 3 /HPF (ref 0–5)

## 2013-06-06 LAB — CBC
MCV: 84 fL (ref 80–100)
Platelet: 264 10*3/uL (ref 150–440)
RBC: 5.04 10*6/uL (ref 4.40–5.90)
RDW: 16.7 % — ABNORMAL HIGH (ref 11.5–14.5)

## 2013-06-16 LAB — CBC WITH DIFFERENTIAL/PLATELET
Eosinophil #: 0.1 10*3/uL (ref 0.0–0.7)
HCT: 40.2 % (ref 40.0–52.0)
HGB: 13.1 g/dL (ref 13.0–18.0)
Lymphocyte #: 1.4 10*3/uL (ref 1.0–3.6)
Lymphocyte %: 20.7 %
MCH: 27.3 pg (ref 26.0–34.0)
Monocyte %: 8.9 %
Neutrophil #: 4.7 10*3/uL (ref 1.4–6.5)
Neutrophil %: 67.5 %
Platelet: 241 10*3/uL (ref 150–440)
RDW: 16.7 % — ABNORMAL HIGH (ref 11.5–14.5)
WBC: 6.9 10*3/uL (ref 3.8–10.6)

## 2013-06-16 LAB — COMPREHENSIVE METABOLIC PANEL
Alkaline Phosphatase: 104 U/L (ref 50–136)
Anion Gap: 7 (ref 7–16)
Bilirubin,Total: 0.2 mg/dL (ref 0.2–1.0)
Calcium, Total: 9.1 mg/dL (ref 8.5–10.1)
Chloride: 102 mmol/L (ref 98–107)
EGFR (Non-African Amer.): >60
Osmolality: 274 (ref 275–301)
Potassium: 4 mmol/L (ref 3.5–5.1)
SGOT(AST): 16 U/L (ref 15–37)
SGPT (ALT): 11 U/L — ABNORMAL LOW (ref 12–78)
Sodium: 136 mmol/L (ref 136–145)

## 2013-06-16 LAB — URINALYSIS, COMPLETE
Glucose,UR: NEGATIVE mg/dL (ref 0–75)
RBC,UR: 1032 /HPF (ref 0–5)

## 2013-06-16 LAB — PROTIME-INR
INR: 1.5
Prothrombin Time: 18.2 secs — ABNORMAL HIGH (ref 11.5–14.7)

## 2013-06-17 ENCOUNTER — Inpatient Hospital Stay: Payer: Self-pay | Admitting: Internal Medicine

## 2013-06-18 LAB — BASIC METABOLIC PANEL
Anion Gap: 5 — ABNORMAL LOW (ref 7–16)
Calcium, Total: 9.2 mg/dL (ref 8.5–10.1)
Chloride: 100 mmol/L (ref 98–107)
Creatinine: 0.69 mg/dL (ref 0.60–1.30)
EGFR (African American): >60
Glucose: 96 mg/dL (ref 65–99)
Osmolality: 274 (ref 275–301)
Potassium: 3.8 mmol/L (ref 3.5–5.1)
Sodium: 137 mmol/L (ref 136–145)

## 2013-06-20 LAB — CBC WITH DIFFERENTIAL/PLATELET
Basophil %: 0.9 %
Eosinophil #: 0.1 10*3/uL (ref 0.0–0.7)
Eosinophil %: 1.4 %
HCT: 40.6 % (ref 40.0–52.0)
Lymphocyte #: 2.2 10*3/uL (ref 1.0–3.6)
Lymphocyte %: 39.4 %
MCH: 27.4 pg (ref 26.0–34.0)
MCHC: 32.9 g/dL (ref 32.0–36.0)
MCV: 83 fL (ref 80–100)
Monocyte #: 0.7 x10 3/mm (ref 0.2–1.0)
Neutrophil %: 45.7 %
Platelet: 215 10*3/uL (ref 150–440)
WBC: 5.7 10*3/uL (ref 3.8–10.6)

## 2013-06-20 LAB — BASIC METABOLIC PANEL
Anion Gap: 5 — ABNORMAL LOW (ref 7–16)
BUN: 19 mg/dL — ABNORMAL HIGH (ref 7–18)
Calcium, Total: 9.1 mg/dL (ref 8.5–10.1)
Co2: 30 mmol/L (ref 21–32)
Creatinine: 0.82 mg/dL (ref 0.60–1.30)
EGFR (Non-African Amer.): >60
Glucose: 82 mg/dL (ref 65–99)
Osmolality: 270 (ref 275–301)
Sodium: 134 mmol/L — ABNORMAL LOW (ref 136–145)

## 2013-06-21 LAB — CULTURE, BLOOD (SINGLE)

## 2013-06-30 ENCOUNTER — Inpatient Hospital Stay: Payer: Self-pay | Admitting: Internal Medicine

## 2013-06-30 LAB — COMPREHENSIVE METABOLIC PANEL
Albumin: 4.2 g/dL (ref 3.4–5.0)
Alkaline Phosphatase: 104 U/L
Anion Gap: 8 (ref 7–16)
BUN: 15 mg/dL (ref 7–18)
Calcium, Total: 9.4 mg/dL (ref 8.5–10.1)
Chloride: 101 mmol/L (ref 98–107)
Co2: 30 mmol/L (ref 21–32)
Creatinine: 0.73 mg/dL (ref 0.60–1.30)
EGFR (African American): >60
EGFR (Non-African Amer.): >60
SGOT(AST): 14 U/L — ABNORMAL LOW (ref 15–37)
SGPT (ALT): 11 U/L — ABNORMAL LOW (ref 12–78)

## 2013-06-30 LAB — URINALYSIS, COMPLETE
Ph: 7 (ref 4.5–8.0)
Protein: NEGATIVE
RBC,UR: 185 /HPF (ref 0–5)
Specific Gravity: 1.013 (ref 1.003–1.030)
WBC UR: 22 /HPF (ref 0–5)

## 2013-06-30 LAB — CBC WITH DIFFERENTIAL/PLATELET
HCT: 44.5 % (ref 40.0–52.0)
HGB: 14.4 g/dL (ref 13.0–18.0)
Lymphocyte #: 1.8 10*3/uL (ref 1.0–3.6)
Lymphocyte %: 26.6 %
MCHC: 32.3 g/dL (ref 32.0–36.0)
MCV: 84 fL (ref 80–100)
Neutrophil #: 4.1 10*3/uL (ref 1.4–6.5)
Neutrophil %: 61.6 %
Platelet: 314 10*3/uL (ref 150–440)
RBC: 5.3 10*6/uL (ref 4.40–5.90)
RDW: 15.9 % — ABNORMAL HIGH (ref 11.5–14.5)
WBC: 6.7 10*3/uL (ref 3.8–10.6)

## 2013-07-01 LAB — BASIC METABOLIC PANEL
Anion Gap: 4 — ABNORMAL LOW (ref 7–16)
Co2: 32 mmol/L (ref 21–32)
Creatinine: 0.82 mg/dL (ref 0.60–1.30)
EGFR (African American): >60
EGFR (Non-African Amer.): >60
Glucose: 99 mg/dL (ref 65–99)
Potassium: 3.4 mmol/L — ABNORMAL LOW (ref 3.5–5.1)
Sodium: 138 mmol/L (ref 136–145)

## 2013-07-01 LAB — CBC WITH DIFFERENTIAL/PLATELET
Basophil #: 0 10*3/uL (ref 0.0–0.1)
Eosinophil %: 2.6 %
Lymphocyte #: 2 10*3/uL (ref 1.0–3.6)
MCH: 27.1 pg (ref 26.0–34.0)
MCHC: 32.3 g/dL (ref 32.0–36.0)
MCV: 84 fL (ref 80–100)
Monocyte %: 10.6 %
Neutrophil #: 3.1 10*3/uL (ref 1.4–6.5)
Neutrophil %: 52.6 %
RDW: 15.9 % — ABNORMAL HIGH (ref 11.5–14.5)
WBC: 5.9 10*3/uL (ref 3.8–10.6)

## 2013-07-04 LAB — URINALYSIS, COMPLETE
Bacteria: NONE SEEN
Bilirubin,UR: NEGATIVE
Blood: NEGATIVE
Glucose,UR: NEGATIVE mg/dL (ref 0–75)
Leukocyte Esterase: NEGATIVE
Nitrite: NEGATIVE
Ph: 7 (ref 4.5–8.0)
Protein: NEGATIVE
RBC,UR: 11 /HPF (ref 0–5)
Specific Gravity: 1.008 (ref 1.003–1.030)
Squamous Epithelial: 2

## 2013-07-05 LAB — CREATININE, SERUM
Creatinine: 0.58 mg/dL — ABNORMAL LOW (ref 0.60–1.30)
EGFR (African American): >60

## 2013-07-05 LAB — CULTURE, BLOOD (SINGLE)

## 2013-07-09 LAB — CULTURE, BLOOD (SINGLE)

## 2013-07-28 ENCOUNTER — Inpatient Hospital Stay: Payer: Self-pay | Admitting: Internal Medicine

## 2013-07-28 LAB — CBC
HCT: 42.5 % (ref 40.0–52.0)
MCH: 27.4 pg (ref 26.0–34.0)
MCHC: 33 g/dL (ref 32.0–36.0)
MCV: 83 fL (ref 80–100)
Platelet: 209 10*3/uL (ref 150–440)
RBC: 5.12 10*6/uL (ref 4.40–5.90)
RDW: 15.8 % — ABNORMAL HIGH (ref 11.5–14.5)
WBC: 5.6 10*3/uL (ref 3.8–10.6)

## 2013-07-28 LAB — URINALYSIS, COMPLETE
Hyaline Cast: 1
Ketone: NEGATIVE
Leukocyte Esterase: NEGATIVE
Nitrite: NEGATIVE
Ph: 6 (ref 4.5–8.0)
Protein: NEGATIVE
RBC,UR: 885 /HPF (ref 0–5)
Specific Gravity: 1.01 (ref 1.003–1.030)

## 2013-07-28 LAB — COMPREHENSIVE METABOLIC PANEL
Anion Gap: 2 — ABNORMAL LOW (ref 7–16)
Bilirubin,Total: 0.2 mg/dL (ref 0.2–1.0)
Chloride: 101 mmol/L (ref 98–107)
EGFR (Non-African Amer.): >60
Osmolality: 273 (ref 275–301)
SGOT(AST): 24 U/L (ref 15–37)
SGPT (ALT): 12 U/L (ref 12–78)
Sodium: 137 mmol/L (ref 136–145)
Total Protein: 7.8 g/dL (ref 6.4–8.2)

## 2013-07-28 LAB — PROTIME-INR
INR: 1.1
Prothrombin Time: 14.2 secs (ref 11.5–14.7)

## 2013-07-29 ENCOUNTER — Ambulatory Visit: Payer: Self-pay | Admitting: Urology

## 2013-07-29 LAB — BASIC METABOLIC PANEL
Anion Gap: 5 — ABNORMAL LOW (ref 7–16)
BUN: 12 mg/dL (ref 7–18)
Calcium, Total: 8.7 mg/dL (ref 8.5–10.1)
Chloride: 102 mmol/L (ref 98–107)
Co2: 31 mmol/L (ref 21–32)
Creatinine: 0.72 mg/dL (ref 0.60–1.30)
EGFR (African American): >60
EGFR (Non-African Amer.): >60
Glucose: 119 mg/dL — ABNORMAL HIGH (ref 65–99)
Osmolality: 277 (ref 275–301)
Potassium: 3.6 mmol/L (ref 3.5–5.1)
Sodium: 138 mmol/L (ref 136–145)

## 2013-07-29 LAB — APTT
Activated PTT: 52 secs — ABNORMAL HIGH (ref 23.6–35.9)
Activated PTT: 54.9 secs — ABNORMAL HIGH (ref 23.6–35.9)
Activated PTT: 53 secs — ABNORMAL HIGH (ref 23.6–35.9)

## 2013-07-29 LAB — CBC WITH DIFFERENTIAL/PLATELET
Basophil #: 0 10*3/uL (ref 0.0–0.1)
Basophil %: 0.8 %
Eosinophil #: 0.1 10*3/uL (ref 0.0–0.7)
Eosinophil %: 1.4 %
HCT: 39.6 % — ABNORMAL LOW (ref 40.0–52.0)
HGB: 13.1 g/dL (ref 13.0–18.0)
Lymphocyte #: 2.3 10*3/uL (ref 1.0–3.6)
Lymphocyte %: 40.4 %
MCH: 27.3 pg (ref 26.0–34.0)
MCHC: 33 g/dL (ref 32.0–36.0)
MCV: 83 fL (ref 80–100)
Monocyte #: 0.4 x10 3/mm (ref 0.2–1.0)
Monocyte %: 8 %
Neutrophil #: 2.8 10*3/uL (ref 1.4–6.5)
Neutrophil %: 49.4 %
Platelet: 195 10*3/uL (ref 150–440)
RBC: 4.79 10*6/uL (ref 4.40–5.90)
RDW: 15.2 % — ABNORMAL HIGH (ref 11.5–14.5)
WBC: 5.6 10*3/uL (ref 3.8–10.6)

## 2013-07-29 LAB — MAGNESIUM: Magnesium: 1.5 mg/dL — ABNORMAL LOW

## 2013-07-30 LAB — BASIC METABOLIC PANEL
Anion Gap: 4 — ABNORMAL LOW (ref 7–16)
BUN: 12 mg/dL (ref 7–18)
CALCIUM: 9.1 mg/dL (ref 8.5–10.1)
CHLORIDE: 99 mmol/L (ref 98–107)
Co2: 34 mmol/L — ABNORMAL HIGH (ref 21–32)
Creatinine: 0.77 mg/dL (ref 0.60–1.30)
EGFR (African American): >60
GLUCOSE: 106 mg/dL — AB (ref 65–99)
OSMOLALITY: 274 (ref 275–301)
Potassium: 3.9 mmol/L (ref 3.5–5.1)
Sodium: 137 mmol/L (ref 136–145)

## 2013-07-30 LAB — CBC WITH DIFFERENTIAL/PLATELET
Basophil #: 0 10*3/uL (ref 0.0–0.1)
Basophil %: 0.6 %
Eosinophil #: 0.1 10*3/uL (ref 0.0–0.7)
Eosinophil %: 1.3 %
HCT: 41 % (ref 40.0–52.0)
HGB: 13.4 g/dL (ref 13.0–18.0)
LYMPHS ABS: 2 10*3/uL (ref 1.0–3.6)
Lymphocyte %: 34.4 %
MCH: 27 pg (ref 26.0–34.0)
MCHC: 32.6 g/dL (ref 32.0–36.0)
MCV: 83 fL (ref 80–100)
Monocyte #: 0.6 x10 3/mm (ref 0.2–1.0)
Monocyte %: 9.5 %
Neutrophil #: 3.2 10*3/uL (ref 1.4–6.5)
Neutrophil %: 54.2 %
PLATELETS: 212 10*3/uL (ref 150–440)
RBC: 4.95 10*6/uL (ref 4.40–5.90)
RDW: 15.9 % — AB (ref 11.5–14.5)
WBC: 5.8 10*3/uL (ref 3.8–10.6)

## 2013-07-30 LAB — APTT: Activated PTT: 80.4 secs — ABNORMAL HIGH (ref 23.6–35.9)

## 2013-07-31 LAB — APTT
ACTIVATED PTT: 122.1 s — AB (ref 23.6–35.9)
ACTIVATED PTT: 73.7 s — AB (ref 23.6–35.9)

## 2013-08-01 LAB — URINALYSIS, COMPLETE
BILIRUBIN, UR: NEGATIVE
Bacteria: NONE SEEN
Glucose,UR: NEGATIVE mg/dL (ref 0–75)
KETONE: NEGATIVE
LEUKOCYTE ESTERASE: NEGATIVE
NITRITE: NEGATIVE
Ph: 8 (ref 4.5–8.0)
Protein: NEGATIVE
SQUAMOUS EPITHELIAL: NONE SEEN
Specific Gravity: 1.005 (ref 1.003–1.030)
WBC UR: <1 /HPF (ref 0–5)

## 2013-08-01 LAB — CBC WITH DIFFERENTIAL/PLATELET
Basophil #: 0 10*3/uL (ref 0.0–0.1)
Basophil %: 0.6 %
EOS ABS: 0.1 10*3/uL (ref 0.0–0.7)
EOS PCT: 1 %
HCT: 42.7 % (ref 40.0–52.0)
HGB: 14.1 g/dL (ref 13.0–18.0)
Lymphocyte #: 2.3 10*3/uL (ref 1.0–3.6)
Lymphocyte %: 35.2 %
MCH: 27.4 pg (ref 26.0–34.0)
MCHC: 32.9 g/dL (ref 32.0–36.0)
MCV: 83 fL (ref 80–100)
Monocyte #: 0.7 x10 3/mm (ref 0.2–1.0)
Monocyte %: 10.4 %
NEUTROS ABS: 3.5 10*3/uL (ref 1.4–6.5)
Neutrophil %: 52.8 %
Platelet: 223 10*3/uL (ref 150–440)
RBC: 5.14 10*6/uL (ref 4.40–5.90)
RDW: 15.6 % — AB (ref 11.5–14.5)
WBC: 6.6 10*3/uL (ref 3.8–10.6)

## 2013-08-01 LAB — COMPREHENSIVE METABOLIC PANEL
AST: 16 U/L (ref 15–37)
Albumin: 3.9 g/dL (ref 3.4–5.0)
Alkaline Phosphatase: 100 U/L
Anion Gap: 5 — ABNORMAL LOW (ref 7–16)
BILIRUBIN TOTAL: 0.3 mg/dL (ref 0.2–1.0)
BUN: 12 mg/dL (ref 7–18)
CALCIUM: 9.2 mg/dL (ref 8.5–10.1)
CO2: 31 mmol/L (ref 21–32)
CREATININE: 0.65 mg/dL (ref 0.60–1.30)
Chloride: 98 mmol/L (ref 98–107)
EGFR (Non-African Amer.): >60
Glucose: 91 mg/dL (ref 65–99)
Osmolality: 268 (ref 275–301)
POTASSIUM: 4.2 mmol/L (ref 3.5–5.1)
SGPT (ALT): 11 U/L — ABNORMAL LOW (ref 12–78)
Sodium: 134 mmol/L — ABNORMAL LOW (ref 136–145)
Total Protein: 7.4 g/dL (ref 6.4–8.2)

## 2013-08-01 LAB — APTT: ACTIVATED PTT: 83 s — AB (ref 23.6–35.9)

## 2013-10-21 ENCOUNTER — Emergency Department: Payer: Self-pay | Admitting: Internal Medicine

## 2013-10-21 LAB — COMPREHENSIVE METABOLIC PANEL
ALK PHOS: 93 U/L
Albumin: 3.9 g/dL (ref 3.4–5.0)
Anion Gap: 4 — ABNORMAL LOW (ref 7–16)
BUN: 19 mg/dL — AB (ref 7–18)
Bilirubin,Total: 0.2 mg/dL (ref 0.2–1.0)
CHLORIDE: 102 mmol/L (ref 98–107)
CREATININE: 0.97 mg/dL (ref 0.60–1.30)
Calcium, Total: 8.7 mg/dL (ref 8.5–10.1)
Co2: 31 mmol/L (ref 21–32)
EGFR (Non-African Amer.): >60
GLUCOSE: 114 mg/dL — AB (ref 65–99)
Osmolality: 277 (ref 275–301)
Potassium: 4.1 mmol/L (ref 3.5–5.1)
SGOT(AST): 7 U/L — ABNORMAL LOW (ref 15–37)
SGPT (ALT): 14 U/L (ref 12–78)
Sodium: 137 mmol/L (ref 136–145)
Total Protein: 8.2 g/dL (ref 6.4–8.2)

## 2013-10-21 LAB — TROPONIN I: Troponin-I: <0.02 ng/mL

## 2013-10-21 LAB — CBC
HCT: 43.4 % (ref 40.0–52.0)
HGB: 14.4 g/dL (ref 13.0–18.0)
MCH: 28.4 pg (ref 26.0–34.0)
MCHC: 33.1 g/dL (ref 32.0–36.0)
MCV: 86 fL (ref 80–100)
PLATELETS: 277 10*3/uL (ref 150–440)
RBC: 5.06 10*6/uL (ref 4.40–5.90)
RDW: 15.2 % — ABNORMAL HIGH (ref 11.5–14.5)
WBC: 6.8 10*3/uL (ref 3.8–10.6)

## 2014-04-05 ENCOUNTER — Ambulatory Visit: Payer: Self-pay | Admitting: Obstetrics and Gynecology

## 2014-04-24 ENCOUNTER — Inpatient Hospital Stay: Payer: Self-pay | Admitting: Internal Medicine

## 2014-04-24 LAB — BASIC METABOLIC PANEL
Anion Gap: 3 — ABNORMAL LOW (ref 7–16)
BUN: 24 mg/dL — ABNORMAL HIGH (ref 7–18)
CREATININE: 0.79 mg/dL (ref 0.60–1.30)
Calcium, Total: 8.8 mg/dL (ref 8.5–10.1)
Chloride: 107 mmol/L (ref 98–107)
Co2: 31 mmol/L (ref 21–32)
EGFR (African American): >60
Glucose: 82 mg/dL (ref 65–99)
Osmolality: 284 (ref 275–301)
Potassium: 4.3 mmol/L (ref 3.5–5.1)
Sodium: 141 mmol/L (ref 136–145)

## 2014-04-24 LAB — URINALYSIS, COMPLETE
BILIRUBIN, UR: NEGATIVE
GLUCOSE, UR: NEGATIVE mg/dL (ref 0–75)
Ketone: NEGATIVE
Nitrite: NEGATIVE
PH: 5 (ref 4.5–8.0)
RBC,UR: 44 /HPF (ref 0–5)
Specific Gravity: 1.028 (ref 1.003–1.030)
Squamous Epithelial: 1
WBC UR: 3274 /HPF (ref 0–5)

## 2014-04-24 LAB — CBC WITH DIFFERENTIAL/PLATELET
BASOS PCT: 0.8 %
Basophil #: 0.1 10*3/uL (ref 0.0–0.1)
EOS PCT: 1.9 %
Eosinophil #: 0.2 10*3/uL (ref 0.0–0.7)
HCT: 41.5 % (ref 40.0–52.0)
HGB: 12.9 g/dL — ABNORMAL LOW (ref 13.0–18.0)
Lymphocyte #: 2.2 10*3/uL (ref 1.0–3.6)
Lymphocyte %: 26.6 %
MCH: 27.2 pg (ref 26.0–34.0)
MCHC: 31.2 g/dL — AB (ref 32.0–36.0)
MCV: 87 fL (ref 80–100)
Monocyte #: 0.7 x10 3/mm (ref 0.2–1.0)
Monocyte %: 8 %
NEUTROS ABS: 5.2 10*3/uL (ref 1.4–6.5)
Neutrophil %: 62.7 %
Platelet: 207 10*3/uL (ref 150–440)
RBC: 4.74 10*6/uL (ref 4.40–5.90)
RDW: 14.1 % (ref 11.5–14.5)
WBC: 8.4 10*3/uL (ref 3.8–10.6)

## 2014-04-25 LAB — CBC WITH DIFFERENTIAL/PLATELET
BASOS ABS: 0.1 10*3/uL (ref 0.0–0.1)
BASOS PCT: 0.7 %
EOS ABS: 0.1 10*3/uL (ref 0.0–0.7)
Eosinophil %: 1.8 %
HCT: 39 % — AB (ref 40.0–52.0)
HGB: 12.6 g/dL — ABNORMAL LOW (ref 13.0–18.0)
LYMPHS ABS: 2.8 10*3/uL (ref 1.0–3.6)
Lymphocyte %: 35.2 %
MCH: 27.8 pg (ref 26.0–34.0)
MCHC: 32.4 g/dL (ref 32.0–36.0)
MCV: 86 fL (ref 80–100)
MONOS PCT: 10.4 %
Monocyte #: 0.8 x10 3/mm (ref 0.2–1.0)
NEUTROS ABS: 4.2 10*3/uL (ref 1.4–6.5)
Neutrophil %: 51.9 %
Platelet: 194 10*3/uL (ref 150–440)
RBC: 4.54 10*6/uL (ref 4.40–5.90)
RDW: 14.2 % (ref 11.5–14.5)
WBC: 8.1 10*3/uL (ref 3.8–10.6)

## 2014-04-25 LAB — BASIC METABOLIC PANEL
ANION GAP: 5 — AB (ref 7–16)
BUN: 22 mg/dL — ABNORMAL HIGH (ref 7–18)
CO2: 29 mmol/L (ref 21–32)
Calcium, Total: 8.2 mg/dL — ABNORMAL LOW (ref 8.5–10.1)
Chloride: 111 mmol/L — ABNORMAL HIGH (ref 98–107)
Creatinine: 0.92 mg/dL (ref 0.60–1.30)
EGFR (African American): >60
EGFR (Non-African Amer.): >60
Glucose: 106 mg/dL — ABNORMAL HIGH (ref 65–99)
OSMOLALITY: 292 (ref 275–301)
Potassium: 3.5 mmol/L (ref 3.5–5.1)
SODIUM: 145 mmol/L (ref 136–145)

## 2014-04-27 LAB — URINE CULTURE

## 2014-05-10 ENCOUNTER — Inpatient Hospital Stay: Payer: Self-pay | Admitting: Internal Medicine

## 2014-05-10 LAB — COMPREHENSIVE METABOLIC PANEL
ALBUMIN: 3.3 g/dL — AB (ref 3.4–5.0)
ANION GAP: 6 — AB (ref 7–16)
Alkaline Phosphatase: 72 U/L
BUN: 24 mg/dL — ABNORMAL HIGH (ref 7–18)
Bilirubin,Total: 0.3 mg/dL (ref 0.2–1.0)
CALCIUM: 8.6 mg/dL (ref 8.5–10.1)
CO2: 30 mmol/L (ref 21–32)
CREATININE: 0.8 mg/dL (ref 0.60–1.30)
Chloride: 106 mmol/L (ref 98–107)
EGFR (Non-African Amer.): >60
Glucose: 114 mg/dL — ABNORMAL HIGH (ref 65–99)
Osmolality: 288 (ref 275–301)
Potassium: 4.4 mmol/L (ref 3.5–5.1)
SGOT(AST): 43 U/L — ABNORMAL HIGH (ref 15–37)
SGPT (ALT): 30 U/L
Sodium: 142 mmol/L (ref 136–145)
TOTAL PROTEIN: 7.5 g/dL (ref 6.4–8.2)

## 2014-05-10 LAB — URINALYSIS, COMPLETE
BACTERIA: NONE SEEN
BLOOD: NEGATIVE
GLUCOSE, UR: NEGATIVE mg/dL (ref 0–75)
Ketone: NEGATIVE
Nitrite: NEGATIVE
PH: 5 (ref 4.5–8.0)
RBC,UR: 5 /HPF (ref 0–5)
Specific Gravity: 1.034 (ref 1.003–1.030)
Squamous Epithelial: 1

## 2014-05-10 LAB — CBC WITH DIFFERENTIAL/PLATELET
Basophil #: 0 10*3/uL (ref 0.0–0.1)
Basophil %: 0.3 %
EOS PCT: 1.5 %
Eosinophil #: 0.1 10*3/uL (ref 0.0–0.7)
HCT: 38.5 % — ABNORMAL LOW (ref 40.0–52.0)
HGB: 12.2 g/dL — ABNORMAL LOW (ref 13.0–18.0)
LYMPHS ABS: 1.6 10*3/uL (ref 1.0–3.6)
Lymphocyte %: 18.4 %
MCH: 27.1 pg (ref 26.0–34.0)
MCHC: 31.8 g/dL — ABNORMAL LOW (ref 32.0–36.0)
MCV: 85 fL (ref 80–100)
Monocyte #: 0.5 x10 3/mm (ref 0.2–1.0)
Monocyte %: 5.8 %
NEUTROS ABS: 6.5 10*3/uL (ref 1.4–6.5)
Neutrophil %: 74 %
Platelet: 259 10*3/uL (ref 150–440)
RBC: 4.52 10*6/uL (ref 4.40–5.90)
RDW: 13.9 % (ref 11.5–14.5)
WBC: 8.8 10*3/uL (ref 3.8–10.6)

## 2014-05-10 LAB — MAGNESIUM: MAGNESIUM: 2.3 mg/dL

## 2014-05-11 LAB — COMPREHENSIVE METABOLIC PANEL
ALT: 27 U/L
Albumin: 3 g/dL — ABNORMAL LOW (ref 3.4–5.0)
Alkaline Phosphatase: 63 U/L
Anion Gap: 5 — ABNORMAL LOW (ref 7–16)
BUN: 21 mg/dL — ABNORMAL HIGH (ref 7–18)
Bilirubin,Total: 0.3 mg/dL (ref 0.2–1.0)
CALCIUM: 8.3 mg/dL — AB (ref 8.5–10.1)
CHLORIDE: 106 mmol/L (ref 98–107)
CO2: 33 mmol/L — AB (ref 21–32)
Creatinine: 0.81 mg/dL (ref 0.60–1.30)
EGFR (African American): >60
EGFR (Non-African Amer.): >60
Glucose: 106 mg/dL — ABNORMAL HIGH (ref 65–99)
Osmolality: 290 (ref 275–301)
Potassium: 3.7 mmol/L (ref 3.5–5.1)
SGOT(AST): 28 U/L (ref 15–37)
SODIUM: 144 mmol/L (ref 136–145)
TOTAL PROTEIN: 6.8 g/dL (ref 6.4–8.2)

## 2014-05-11 LAB — CBC WITH DIFFERENTIAL/PLATELET
BASOS ABS: 0 10*3/uL (ref 0.0–0.1)
BASOS PCT: 0.7 %
EOS ABS: 0.1 10*3/uL (ref 0.0–0.7)
EOS PCT: 2.1 %
HCT: 36.9 % — ABNORMAL LOW (ref 40.0–52.0)
HGB: 11.7 g/dL — ABNORMAL LOW (ref 13.0–18.0)
LYMPHS ABS: 1.8 10*3/uL (ref 1.0–3.6)
Lymphocyte %: 27 %
MCH: 26.9 pg (ref 26.0–34.0)
MCHC: 31.6 g/dL — ABNORMAL LOW (ref 32.0–36.0)
MCV: 85 fL (ref 80–100)
Monocyte #: 0.5 x10 3/mm (ref 0.2–1.0)
Monocyte %: 7.7 %
NEUTROS ABS: 4.2 10*3/uL (ref 1.4–6.5)
Neutrophil %: 62.5 %
Platelet: 233 10*3/uL (ref 150–440)
RBC: 4.34 10*6/uL — AB (ref 4.40–5.90)
RDW: 13.8 % (ref 11.5–14.5)
WBC: 6.7 10*3/uL (ref 3.8–10.6)

## 2014-05-14 LAB — URINE CULTURE

## 2014-05-15 LAB — CULTURE, BLOOD (SINGLE)

## 2014-05-16 LAB — HEMOGLOBIN: HGB: 13.4 g/dL (ref 13.0–18.0)

## 2014-05-16 LAB — BASIC METABOLIC PANEL
Anion Gap: 9 (ref 7–16)
BUN: 23 mg/dL — ABNORMAL HIGH (ref 7–18)
Calcium, Total: 8.6 mg/dL (ref 8.5–10.1)
Chloride: 105 mmol/L (ref 98–107)
Co2: 25 mmol/L (ref 21–32)
Creatinine: 0.69 mg/dL (ref 0.60–1.30)
EGFR (African American): >60
EGFR (Non-African Amer.): >60
Glucose: 86 mg/dL (ref 65–99)
Osmolality: 281 (ref 275–301)
Potassium: 4.4 mmol/L (ref 3.5–5.1)
Sodium: 139 mmol/L (ref 136–145)

## 2014-05-16 LAB — URINALYSIS, COMPLETE
BACTERIA: NONE SEEN
BILIRUBIN, UR: NEGATIVE
Blood: NEGATIVE
Glucose,UR: NEGATIVE mg/dL (ref 0–75)
Ketone: NEGATIVE
Leukocyte Esterase: NEGATIVE
Nitrite: NEGATIVE
PH: 6 (ref 4.5–8.0)
SPECIFIC GRAVITY: 1.031 (ref 1.003–1.030)
Squamous Epithelial: NONE SEEN
WBC UR: 6 /HPF (ref 0–5)

## 2014-05-16 LAB — CBC
HCT: 42.1 % (ref 40.0–52.0)
HGB: 13.6 g/dL (ref 13.0–18.0)
MCH: 27.1 pg (ref 26.0–34.0)
MCHC: 32.3 g/dL (ref 32.0–36.0)
MCV: 84 fL (ref 80–100)
Platelet: 285 10*3/uL (ref 150–440)
RBC: 5.02 10*6/uL (ref 4.40–5.90)
RDW: 14.1 % (ref 11.5–14.5)
WBC: 8.3 10*3/uL (ref 3.8–10.6)

## 2014-05-16 LAB — CREATININE, SERUM
Creatinine: 0.8 mg/dL (ref 0.60–1.30)
EGFR (African American): >60
EGFR (Non-African Amer.): >60

## 2014-05-17 ENCOUNTER — Observation Stay: Payer: Self-pay | Admitting: Internal Medicine

## 2014-05-17 LAB — CBC WITH DIFFERENTIAL/PLATELET
Basophil #: 0.1 10*3/uL (ref 0.0–0.1)
Basophil %: 0.7 %
EOS PCT: 4.2 %
Eosinophil #: 0.3 10*3/uL (ref 0.0–0.7)
HCT: 40.2 % (ref 40.0–52.0)
HGB: 12.9 g/dL — ABNORMAL LOW (ref 13.0–18.0)
LYMPHS ABS: 1.8 10*3/uL (ref 1.0–3.6)
Lymphocyte %: 23.1 %
MCH: 26.8 pg (ref 26.0–34.0)
MCHC: 32.2 g/dL (ref 32.0–36.0)
MCV: 83 fL (ref 80–100)
MONO ABS: 0.7 x10 3/mm (ref 0.2–1.0)
Monocyte %: 8.6 %
NEUTROS ABS: 5 10*3/uL (ref 1.4–6.5)
Neutrophil %: 63.4 %
PLATELETS: 243 10*3/uL (ref 150–440)
RBC: 4.83 10*6/uL (ref 4.40–5.90)
RDW: 13.9 % (ref 11.5–14.5)
WBC: 7.9 10*3/uL (ref 3.8–10.6)

## 2014-05-17 LAB — BASIC METABOLIC PANEL
ANION GAP: 8 (ref 7–16)
BUN: 23 mg/dL — ABNORMAL HIGH (ref 7–18)
Calcium, Total: 8.5 mg/dL (ref 8.5–10.1)
Chloride: 106 mmol/L (ref 98–107)
Co2: 27 mmol/L (ref 21–32)
Creatinine: 0.61 mg/dL (ref 0.60–1.30)
EGFR (African American): >60
EGFR (Non-African Amer.): >60
GLUCOSE: 94 mg/dL (ref 65–99)
OSMOLALITY: 285 (ref 275–301)
Potassium: 3.6 mmol/L (ref 3.5–5.1)
Sodium: 141 mmol/L (ref 136–145)

## 2014-06-10 ENCOUNTER — Emergency Department: Payer: Self-pay | Admitting: Emergency Medicine

## 2014-06-13 ENCOUNTER — Inpatient Hospital Stay: Payer: Self-pay | Admitting: Internal Medicine

## 2014-06-13 LAB — URINALYSIS, COMPLETE
BACTERIA: NONE SEEN
Bilirubin,UR: NEGATIVE
Blood: NEGATIVE
GLUCOSE, UR: NEGATIVE mg/dL (ref 0–75)
LEUKOCYTE ESTERASE: NEGATIVE
Nitrite: NEGATIVE
PROTEIN: NEGATIVE
Ph: 6 (ref 4.5–8.0)
RBC,UR: 1 /HPF (ref 0–5)
Specific Gravity: 1.015 (ref 1.003–1.030)
Squamous Epithelial: NONE SEEN

## 2014-06-13 LAB — CBC WITH DIFFERENTIAL/PLATELET
BASOS ABS: 0 10*3/uL (ref 0.0–0.1)
BASOS PCT: 0.6 %
Eosinophil #: 0.1 10*3/uL (ref 0.0–0.7)
Eosinophil %: 2 %
HCT: 38.9 % — ABNORMAL LOW (ref 40.0–52.0)
HGB: 12.3 g/dL — ABNORMAL LOW (ref 13.0–18.0)
Lymphocyte #: 1.3 10*3/uL (ref 1.0–3.6)
Lymphocyte %: 23.8 %
MCH: 26.1 pg (ref 26.0–34.0)
MCHC: 31.7 g/dL — ABNORMAL LOW (ref 32.0–36.0)
MCV: 82 fL (ref 80–100)
Monocyte #: 0.5 x10 3/mm (ref 0.2–1.0)
Monocyte %: 8.9 %
Neutrophil #: 3.5 10*3/uL (ref 1.4–6.5)
Neutrophil %: 64.7 %
Platelet: 198 10*3/uL (ref 150–440)
RBC: 4.73 10*6/uL (ref 4.40–5.90)
RDW: 14.5 % (ref 11.5–14.5)
WBC: 5.4 10*3/uL (ref 3.8–10.6)

## 2014-06-13 LAB — BASIC METABOLIC PANEL
Anion Gap: 5 — ABNORMAL LOW (ref 7–16)
BUN: 17 mg/dL (ref 7–18)
CALCIUM: 8.3 mg/dL — AB (ref 8.5–10.1)
Chloride: 105 mmol/L (ref 98–107)
Co2: 31 mmol/L (ref 21–32)
Creatinine: 0.8 mg/dL (ref 0.60–1.30)
EGFR (Non-African Amer.): >60
Glucose: 78 mg/dL (ref 65–99)
Osmolality: 282 (ref 275–301)
Potassium: 4.3 mmol/L (ref 3.5–5.1)
SODIUM: 141 mmol/L (ref 136–145)

## 2014-06-13 LAB — TROPONIN I: Troponin-I: <0.02 ng/mL

## 2014-06-14 LAB — BASIC METABOLIC PANEL
ANION GAP: 5 — AB (ref 7–16)
BUN: 15 mg/dL (ref 7–18)
CHLORIDE: 103 mmol/L (ref 98–107)
CO2: 33 mmol/L — AB (ref 21–32)
CREATININE: 0.71 mg/dL (ref 0.60–1.30)
Calcium, Total: 8.1 mg/dL — ABNORMAL LOW (ref 8.5–10.1)
EGFR (African American): >60
EGFR (Non-African Amer.): >60
GLUCOSE: 76 mg/dL (ref 65–99)
OSMOLALITY: 281 (ref 275–301)
POTASSIUM: 3.8 mmol/L (ref 3.5–5.1)
SODIUM: 141 mmol/L (ref 136–145)

## 2014-06-14 LAB — CBC WITH DIFFERENTIAL/PLATELET
BASOS PCT: 0.5 %
Basophil #: 0 10*3/uL (ref 0.0–0.1)
EOS PCT: 1 %
Eosinophil #: 0.1 10*3/uL (ref 0.0–0.7)
HCT: 34.2 % — AB (ref 40.0–52.0)
HGB: 11 g/dL — ABNORMAL LOW (ref 13.0–18.0)
Lymphocyte #: 1.6 10*3/uL (ref 1.0–3.6)
Lymphocyte %: 23.9 %
MCH: 26.4 pg (ref 26.0–34.0)
MCHC: 32.1 g/dL (ref 32.0–36.0)
MCV: 82 fL (ref 80–100)
MONO ABS: 0.5 x10 3/mm (ref 0.2–1.0)
Monocyte %: 8.2 %
NEUTROS ABS: 4.4 10*3/uL (ref 1.4–6.5)
Neutrophil %: 66.4 %
Platelet: 184 10*3/uL (ref 150–440)
RBC: 4.17 10*6/uL — AB (ref 4.40–5.90)
RDW: 14.1 % (ref 11.5–14.5)
WBC: 6.6 10*3/uL (ref 3.8–10.6)

## 2014-06-14 LAB — URINE CULTURE

## 2014-06-16 LAB — CBC WITH DIFFERENTIAL/PLATELET
Basophil #: 0 10*3/uL (ref 0.0–0.1)
Basophil %: 0.8 %
EOS PCT: 2.8 %
Eosinophil #: 0.1 10*3/uL (ref 0.0–0.7)
HCT: 33.5 % — ABNORMAL LOW (ref 40.0–52.0)
HGB: 10.9 g/dL — AB (ref 13.0–18.0)
LYMPHS ABS: 1.9 10*3/uL (ref 1.0–3.6)
Lymphocyte %: 42.4 %
MCH: 26.4 pg (ref 26.0–34.0)
MCHC: 32.6 g/dL (ref 32.0–36.0)
MCV: 81 fL (ref 80–100)
Monocyte #: 0.7 x10 3/mm (ref 0.2–1.0)
Monocyte %: 15.5 %
NEUTROS PCT: 38.5 %
Neutrophil #: 1.8 10*3/uL (ref 1.4–6.5)
Platelet: 205 10*3/uL (ref 150–440)
RBC: 4.13 10*6/uL — ABNORMAL LOW (ref 4.40–5.90)
RDW: 14.7 % — ABNORMAL HIGH (ref 11.5–14.5)
WBC: 4.5 10*3/uL (ref 3.8–10.6)

## 2014-06-17 LAB — URINALYSIS, COMPLETE
BILIRUBIN, UR: NEGATIVE
BLOOD: NEGATIVE
GLUCOSE, UR: NEGATIVE mg/dL (ref 0–75)
Ketone: NEGATIVE
Leukocyte Esterase: NEGATIVE
NITRITE: NEGATIVE
PH: 8 (ref 4.5–8.0)
PROTEIN: NEGATIVE
SPECIFIC GRAVITY: 1.011 (ref 1.003–1.030)
Squamous Epithelial: NONE SEEN

## 2014-06-18 LAB — CBC WITH DIFFERENTIAL/PLATELET
Basophil #: 0.1 10*3/uL (ref 0.0–0.1)
Basophil %: 0.9 %
EOS PCT: 3.8 %
Eosinophil #: 0.2 10*3/uL (ref 0.0–0.7)
HCT: 38.9 % — AB (ref 40.0–52.0)
Lymphocyte #: 2.2 10*3/uL (ref 1.0–3.6)
Lymphocyte %: 35.3 %
MCH: 26.4 pg (ref 26.0–34.0)
MCHC: 32.3 g/dL (ref 32.0–36.0)
MCV: 82 fL (ref 80–100)
MONO ABS: 0.6 x10 3/mm (ref 0.2–1.0)
MONOS PCT: 10.4 %
NEUTROS PCT: 49.6 %
Neutrophil #: 3.1 10*3/uL (ref 1.4–6.5)
Platelet: 214 10*3/uL (ref 150–440)
RBC: 4.76 10*6/uL (ref 4.40–5.90)
RDW: 14.9 % — ABNORMAL HIGH (ref 11.5–14.5)
WBC: 6.2 10*3/uL (ref 3.8–10.6)

## 2014-06-18 LAB — COMPREHENSIVE METABOLIC PANEL
ALBUMIN: 3.1 g/dL — AB (ref 3.4–5.0)
ALK PHOS: 72 U/L
ANION GAP: 11 (ref 7–16)
BUN: 11 mg/dL (ref 7–18)
Bilirubin,Total: 0.2 mg/dL (ref 0.2–1.0)
CHLORIDE: 107 mmol/L (ref 98–107)
CO2: 27 mmol/L (ref 21–32)
Calcium, Total: 8.3 mg/dL — ABNORMAL LOW (ref 8.5–10.1)
Glucose: 93 mg/dL (ref 65–99)
OSMOLALITY: 288 (ref 275–301)
POTASSIUM: 4 mmol/L (ref 3.5–5.1)
SGOT(AST): 21 U/L (ref 15–37)
SGPT (ALT): 21 U/L
Sodium: 145 mmol/L (ref 136–145)
Total Protein: 6.7 g/dL (ref 6.4–8.2)

## 2014-06-18 LAB — CREATININE, SERUM: Creatinine: 0.65 mg/dL (ref 0.60–1.30)

## 2014-06-18 LAB — HEPARIN LEVEL (UNFRACTIONATED)
ANTI-XA(UNFRACTIONATED): 0.3 [IU]/mL (ref 0.30–0.70)
Anti-Xa(Unfractionated): >1.1 IU/mL (ref 0.30–0.70)

## 2014-06-18 LAB — CULTURE, BLOOD (SINGLE)

## 2014-06-18 LAB — HEMOGLOBIN: HGB: 12.6 g/dL — ABNORMAL LOW (ref 13.0–18.0)

## 2014-06-18 LAB — PROTIME-INR
INR: 1.1
Prothrombin Time: 13.8 secs (ref 11.5–14.7)

## 2014-06-18 LAB — APTT: Activated PTT: 103.9 secs — ABNORMAL HIGH (ref 23.6–35.9)

## 2014-06-19 LAB — CBC WITH DIFFERENTIAL/PLATELET
BASOS ABS: 0 10*3/uL (ref 0.0–0.1)
Basophil %: 0.7 %
EOS ABS: 0.2 10*3/uL (ref 0.0–0.7)
Eosinophil %: 2.3 %
HCT: 40.2 % (ref 40.0–52.0)
HGB: 12.9 g/dL — AB (ref 13.0–18.0)
LYMPHS PCT: 36.5 %
Lymphocyte #: 2.4 10*3/uL (ref 1.0–3.6)
MCH: 26.5 pg (ref 26.0–34.0)
MCHC: 32.2 g/dL (ref 32.0–36.0)
MCV: 82 fL (ref 80–100)
Monocyte #: 0.7 x10 3/mm (ref 0.2–1.0)
Monocyte %: 10.3 %
NEUTROS PCT: 50.2 %
Neutrophil #: 3.3 10*3/uL (ref 1.4–6.5)
Platelet: 216 10*3/uL (ref 150–440)
RBC: 4.88 10*6/uL (ref 4.40–5.90)
RDW: 15.3 % — ABNORMAL HIGH (ref 11.5–14.5)
WBC: 6.6 10*3/uL (ref 3.8–10.6)

## 2014-06-19 LAB — HEPARIN LEVEL (UNFRACTIONATED): ANTI-XA(UNFRACTIONATED): 0.57 [IU]/mL (ref 0.30–0.70)

## 2014-06-19 LAB — URINE CULTURE

## 2014-06-20 LAB — CBC WITH DIFFERENTIAL/PLATELET
BASOS ABS: 0 10*3/uL (ref 0.0–0.1)
Basophil %: 0.5 %
Eosinophil #: 0.1 10*3/uL (ref 0.0–0.7)
Eosinophil %: 1.6 %
HCT: 40.9 % (ref 40.0–52.0)
HGB: 13.1 g/dL (ref 13.0–18.0)
Lymphocyte #: 2.5 10*3/uL (ref 1.0–3.6)
Lymphocyte %: 31.5 %
MCH: 26.2 pg (ref 26.0–34.0)
MCHC: 32 g/dL (ref 32.0–36.0)
MCV: 82 fL (ref 80–100)
Monocyte #: 0.8 x10 3/mm (ref 0.2–1.0)
Monocyte %: 10.5 %
NEUTROS ABS: 4.5 10*3/uL (ref 1.4–6.5)
Neutrophil %: 55.9 %
PLATELETS: 246 10*3/uL (ref 150–440)
RBC: 4.99 10*6/uL (ref 4.40–5.90)
RDW: 15.3 % — ABNORMAL HIGH (ref 11.5–14.5)
WBC: 8.1 10*3/uL (ref 3.8–10.6)

## 2014-06-20 LAB — HEPARIN LEVEL (UNFRACTIONATED): ANTI-XA(UNFRACTIONATED): 0.45 [IU]/mL (ref 0.30–0.70)

## 2014-06-21 LAB — CBC WITH DIFFERENTIAL/PLATELET
Basophil #: 0.1 10*3/uL (ref 0.0–0.1)
Basophil %: 0.9 %
Eosinophil #: 0.2 10*3/uL (ref 0.0–0.7)
Eosinophil %: 2.2 %
HCT: 42.6 % (ref 40.0–52.0)
HGB: 13.7 g/dL (ref 13.0–18.0)
Lymphocyte #: 2.3 10*3/uL (ref 1.0–3.6)
Lymphocyte %: 29.2 %
MCH: 26.6 pg (ref 26.0–34.0)
MCHC: 32.2 g/dL (ref 32.0–36.0)
MCV: 83 fL (ref 80–100)
Monocyte #: 1 x10 3/mm (ref 0.2–1.0)
Monocyte %: 13.1 %
NEUTROS ABS: 4.2 10*3/uL (ref 1.4–6.5)
Neutrophil %: 54.6 %
PLATELETS: 237 10*3/uL (ref 150–440)
RBC: 5.15 10*6/uL (ref 4.40–5.90)
RDW: 15.1 % — ABNORMAL HIGH (ref 11.5–14.5)
WBC: 7.8 10*3/uL (ref 3.8–10.6)

## 2014-06-21 LAB — COMPREHENSIVE METABOLIC PANEL
ALBUMIN: 3.5 g/dL (ref 3.4–5.0)
ALT: 28 U/L
Alkaline Phosphatase: 88 U/L
Anion Gap: 6 — ABNORMAL LOW (ref 7–16)
BILIRUBIN TOTAL: 0.3 mg/dL (ref 0.2–1.0)
BUN: 15 mg/dL (ref 7–18)
Calcium, Total: 9 mg/dL (ref 8.5–10.1)
Chloride: 98 mmol/L (ref 98–107)
Co2: 30 mmol/L (ref 21–32)
Creatinine: 0.75 mg/dL (ref 0.60–1.30)
EGFR (African American): >60
EGFR (Non-African Amer.): >60
GLUCOSE: 97 mg/dL (ref 65–99)
Osmolality: 269 (ref 275–301)
Potassium: 4.1 mmol/L (ref 3.5–5.1)
SGOT(AST): 15 U/L (ref 15–37)
SODIUM: 134 mmol/L — AB (ref 136–145)
TOTAL PROTEIN: 7.9 g/dL (ref 6.4–8.2)

## 2014-06-21 LAB — HEPARIN LEVEL (UNFRACTIONATED): ANTI-XA(UNFRACTIONATED): 0.48 [IU]/mL (ref 0.30–0.70)

## 2014-06-26 LAB — APTT: ACTIVATED PTT: 36.2 s — AB (ref 23.6–35.9)

## 2014-06-26 LAB — COMPREHENSIVE METABOLIC PANEL
ALK PHOS: 71 U/L
ANION GAP: 6 — AB (ref 7–16)
AST: 11 U/L — AB (ref 15–37)
Albumin: 3.4 g/dL (ref 3.4–5.0)
BILIRUBIN TOTAL: 0.3 mg/dL (ref 0.2–1.0)
BUN: 29 mg/dL — AB (ref 7–18)
CHLORIDE: 104 mmol/L (ref 98–107)
Calcium, Total: 8.8 mg/dL (ref 8.5–10.1)
Co2: 31 mmol/L (ref 21–32)
Creatinine: 0.81 mg/dL (ref 0.60–1.30)
GLUCOSE: 105 mg/dL — AB (ref 65–99)
OSMOLALITY: 287 (ref 275–301)
Potassium: 4.7 mmol/L (ref 3.5–5.1)
SGPT (ALT): 25 U/L
Sodium: 141 mmol/L (ref 136–145)
Total Protein: 7.7 g/dL (ref 6.4–8.2)

## 2014-06-26 LAB — CBC
HCT: 39.6 % — ABNORMAL LOW (ref 40.0–52.0)
HGB: 12.8 g/dL — ABNORMAL LOW (ref 13.0–18.0)
MCH: 26.4 pg (ref 26.0–34.0)
MCHC: 32.2 g/dL (ref 32.0–36.0)
MCV: 82 fL (ref 80–100)
PLATELETS: 264 10*3/uL (ref 150–440)
RBC: 4.85 10*6/uL (ref 4.40–5.90)
RDW: 15.8 % — ABNORMAL HIGH (ref 11.5–14.5)
WBC: 4.9 10*3/uL (ref 3.8–10.6)

## 2014-06-26 LAB — TROPONIN I

## 2014-06-26 LAB — PROTIME-INR
INR: 1.3
Prothrombin Time: 15.6 secs — ABNORMAL HIGH (ref 11.5–14.7)

## 2014-06-27 ENCOUNTER — Inpatient Hospital Stay: Payer: Self-pay | Admitting: Internal Medicine

## 2014-06-27 LAB — URINALYSIS, COMPLETE
BLOOD: NEGATIVE
Bacteria: NONE SEEN
Glucose,UR: NEGATIVE mg/dL (ref 0–75)
Hyaline Cast: 5
LEUKOCYTE ESTERASE: NEGATIVE
NITRITE: NEGATIVE
PH: 5 (ref 4.5–8.0)
RBC,UR: 1 /HPF (ref 0–5)
SPECIFIC GRAVITY: 1.029 (ref 1.003–1.030)
Squamous Epithelial: NONE SEEN

## 2014-06-28 LAB — CBC WITH DIFFERENTIAL/PLATELET
Basophil #: 0.1 10*3/uL (ref 0.0–0.1)
Basophil %: 1 %
EOS PCT: 1 %
Eosinophil #: 0.1 10*3/uL (ref 0.0–0.7)
HCT: 36 % — ABNORMAL LOW (ref 40.0–52.0)
HGB: 11.4 g/dL — AB (ref 13.0–18.0)
LYMPHS ABS: 1.5 10*3/uL (ref 1.0–3.6)
LYMPHS PCT: 28.8 %
MCH: 26.2 pg (ref 26.0–34.0)
MCHC: 31.7 g/dL — ABNORMAL LOW (ref 32.0–36.0)
MCV: 83 fL (ref 80–100)
MONO ABS: 0.5 x10 3/mm (ref 0.2–1.0)
Monocyte %: 9.4 %
Neutrophil #: 3 10*3/uL (ref 1.4–6.5)
Neutrophil %: 59.8 %
PLATELETS: 221 10*3/uL (ref 150–440)
RBC: 4.35 10*6/uL — ABNORMAL LOW (ref 4.40–5.90)
RDW: 15.2 % — ABNORMAL HIGH (ref 11.5–14.5)
WBC: 5.1 10*3/uL (ref 3.8–10.6)

## 2014-06-28 LAB — BASIC METABOLIC PANEL
ANION GAP: 9 (ref 7–16)
BUN: 24 mg/dL — ABNORMAL HIGH (ref 7–18)
CALCIUM: 8.3 mg/dL — AB (ref 8.5–10.1)
CHLORIDE: 108 mmol/L — AB (ref 98–107)
Co2: 26 mmol/L (ref 21–32)
Creatinine: 0.59 mg/dL — ABNORMAL LOW (ref 0.60–1.30)
EGFR (African American): >60
EGFR (Non-African Amer.): >60
GLUCOSE: 78 mg/dL (ref 65–99)
Osmolality: 288 (ref 275–301)
Potassium: 3.6 mmol/L (ref 3.5–5.1)
SODIUM: 143 mmol/L (ref 136–145)

## 2014-06-28 LAB — URINE CULTURE

## 2014-07-01 LAB — CBC WITH DIFFERENTIAL/PLATELET
Basophil #: 0 10*3/uL (ref 0.0–0.1)
Basophil %: 0.7 %
Eosinophil #: 0.1 10*3/uL (ref 0.0–0.7)
Eosinophil %: 2.8 %
HCT: 32.5 % — ABNORMAL LOW (ref 40.0–52.0)
HGB: 10.3 g/dL — ABNORMAL LOW (ref 13.0–18.0)
Lymphocyte #: 1.7 10*3/uL (ref 1.0–3.6)
Lymphocyte %: 39.8 %
MCH: 26.2 pg (ref 26.0–34.0)
MCHC: 31.9 g/dL — ABNORMAL LOW (ref 32.0–36.0)
MCV: 82 fL (ref 80–100)
Monocyte #: 0.4 x10 3/mm (ref 0.2–1.0)
Monocyte %: 9.6 %
Neutrophil #: 2 10*3/uL (ref 1.4–6.5)
Neutrophil %: 47.1 %
Platelet: 191 10*3/uL (ref 150–440)
RBC: 3.95 10*6/uL — ABNORMAL LOW (ref 4.40–5.90)
RDW: 15.6 % — ABNORMAL HIGH (ref 11.5–14.5)
WBC: 4.2 10*3/uL (ref 3.8–10.6)

## 2014-07-01 LAB — BASIC METABOLIC PANEL
Anion Gap: 8 (ref 7–16)
BUN: 8 mg/dL (ref 7–18)
CALCIUM: 7.8 mg/dL — AB (ref 8.5–10.1)
Chloride: 111 mmol/L — ABNORMAL HIGH (ref 98–107)
Co2: 26 mmol/L (ref 21–32)
Creatinine: 0.62 mg/dL (ref 0.60–1.30)
EGFR (Non-African Amer.): >60
Glucose: 98 mg/dL (ref 65–99)
Osmolality: 287 (ref 275–301)
Potassium: 3.3 mmol/L — ABNORMAL LOW (ref 3.5–5.1)
SODIUM: 145 mmol/L (ref 136–145)

## 2014-07-01 LAB — CULTURE, BLOOD (SINGLE)

## 2014-07-02 DIAGNOSIS — R1310 Dysphagia, unspecified: Secondary | ICD-10-CM

## 2014-07-02 HISTORY — PX: OTHER SURGICAL HISTORY: SHX169

## 2014-07-04 ENCOUNTER — Encounter: Payer: Self-pay | Admitting: General Surgery

## 2014-09-17 ENCOUNTER — Observation Stay: Payer: Self-pay | Admitting: Internal Medicine

## 2014-11-09 ENCOUNTER — Encounter: Payer: Self-pay | Admitting: General Surgery

## 2014-11-09 ENCOUNTER — Ambulatory Visit (INDEPENDENT_AMBULATORY_CARE_PROVIDER_SITE_OTHER): Payer: BLUE CROSS/BLUE SHIELD | Admitting: General Surgery

## 2014-11-09 VITALS — BP 102/58 | HR 70 | Resp 10 | Ht 72.0 in | Wt 190.0 lb

## 2014-11-09 DIAGNOSIS — L989 Disorder of the skin and subcutaneous tissue, unspecified: Secondary | ICD-10-CM

## 2014-11-09 DIAGNOSIS — K9423 Gastrostomy malfunction: Secondary | ICD-10-CM

## 2014-11-09 DIAGNOSIS — R238 Other skin changes: Secondary | ICD-10-CM | POA: Insufficient documentation

## 2014-11-09 NOTE — Patient Instructions (Signed)
The patient is aware to call back for any questions or concerns.  

## 2014-11-09 NOTE — Progress Notes (Signed)
Patient ID: [name] [name], male   DOB: 01/24/1986, 29 y.o.   MRN: [id number]  Chief Complaint  Patient presents with  . Other    feeding tube leaking    HPI [name] [name] [name] is a 29 y.o. male here today for a evaluation of a feeding tube/with balloon that is leaking. His mother states it has leaked on and off but his weekend seemed to be worse.   He has a history of traumatic brain injury 2013. He is in his mobile chair. hje is here today with his mom and caregiver. He does get water boluses 4 times a day and the nutrition is based on his po intake.  HPI  Past Medical History  Diagnosis Date  . Hepatitis C   . Scoliosis   . 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 Subarachnoid hemorrhage   . Pulmonary emboli   . Hepatitis C   . Depression     Past Surgical History  Procedure Laterality Date  . Fracture surgery      left acetabulum  . Ileostomy    . Lung tube    . Ankle surgery Bilateral   . Tracheostomy    . Peg tube placement      No family history on file.  Social History History  Substance Use Topics  . Smoking status: Former Smoker    Quit date: 08/07/2011  . Smokeless tobacco: Never Used  . Alcohol Use: No    Allergies  Allergen Reactions  . Ambien [Zolpidem]        . Ativan [Lorazepam]   . Depakote Er [Divalproex Sodium Er]   . Dilaudid [Hydromorphone] Hives  . Keppra [Levetiracetam]     Current Outpatient Prescriptions  Medication Sig Dispense Refill  . acetaminophen (TYLENOL) 500 MG tablet Take 1,000 mg by mouth every 6 (six) hours as needed.    . APIXABAN PO Take by mouth.    . baclofen (LIORESAL) 20 MG tablet Take 20 mg by mouth 3 (three) times daily.    . cetirizine (ZYRTEC) 10 MG tablet Take 10 mg by mouth daily.    . chlorhexidine (PERIDEX) 0.12 % solution Use as directed 15 mLs in the mouth or throat 2 (two) times daily.    . Cranberry 1000 MG CAPS Take by mouth.    . docusate sodium (COLACE) 100 MG capsule Take 100 mg by mouth daily.    Marland Kitchen.  gabapentin (NEURONTIN) 300 MG capsule Take 900 mg by mouth 4 (four) times daily.     Marland Kitchen. ibuprofen (ADVIL,MOTRIN) 200 MG tablet Take 600-800 mg by mouth every 6 (six) hours as needed. For pain    . ipratropium (ATROVENT HFA) 17 MCG/ACT inhaler Inhale 2 puffs into the lungs every 6 (six) hours.    Marland Kitchen. lacosamide (VIMPAT) 200 MG TABS tablet Take 200 mg by mouth 2 (two) times daily.    Marland Kitchen. levETIRAcetam (KEPPRA) 500 MG tablet Take 500 mg by mouth 2 (two) times daily.    Marland Kitchen. loratadine (CLARITIN) 10 MG tablet Take 10 mg by mouth daily.    . Multiple Vitamins-Minerals (MULTIVITAMIN WITH MINERALS) tablet Take 1 tablet by mouth daily.    . ondansetron (ZOFRAN) 4 MG tablet Take 4 mg by mouth every 8 (eight) hours as needed for nausea or vomiting.    . pantoprazole (PROTONIX) 40 MG tablet Take 40 mg by mouth daily.    . polyethylene glycol (MIRALAX / GLYCOLAX) packet Take 17 g by mouth daily.    . risperiDONE (RISPERDAL)  1 MG tablet Take 1 mg by mouth 3 (three) times daily.    . tamsulosin (FLOMAX) 0.4 MG CAPS capsule Take 0.4 mg by mouth.    . traMADol (ULTRAM) 50 MG tablet Take 50-100 mg by mouth 3 (three) times daily as needed. For pain. Maximum dose= 8 tablets per day     . traZODone (DESYREL) 50 MG tablet Take 150 mg by mouth at bedtime.    . nortriptyline (PAMELOR) 50 MG capsule Take 2 capsules (100 mg total) by mouth at bedtime. 60 capsule 3  . pregabalin (LYRICA) 75 MG capsule Take 1 capsule (75 mg total) by mouth 3 (three) times daily. Take one at night for 4 days, then one twice a day for 4 days then one three x per day thereafter. 90 capsule 4   No current facility-administered medications for this visit.    Review of Systems Review of Systems  Constitutional: Negative.   Respiratory: Negative.   Cardiovascular: Negative.     Blood pressure 102/58, pulse 70, resp. rate 10, height 6' (1.829 m), weight 190 lb (86.183 kg).  Physical Exam Physical Exam  Constitutional: He appears well-nourished.   Abdominal:    Neurological: He is alert.  Skin: Skin is warm and dry.      Assessment    Mild local skin irritation secondary to gastric content leakage.    Plan    A Janeway gastrostomy had been placed as the patient had frequently removed previously placed percutaneous tubes. He had presented to the emergency department 1-2 months ago and was evaluated by Dr. Katrinka Blazing who placed a balloon tipped catheter through the site.  The patient uses the tube for hydration, taking most of his nutrition by mouth.  DuoDerm will be placed around the stoma site to direct the leakage to the overlying gauze rather than keeping on the skin. This should resolve the skin irritation. The DuoDERM should be changed when it is loose.  The patient is presently on a PPI, so the local irritation is less likely acid and more just gastric contents.  The plan/hope is that keeping the area dry will resolve the local skin irritation.  The patient was accompanied today by his primary caregiver, [name], as well as his mother.      Follow up as needed.   PCP:  Yves Dill 11/09/2014, 11:34 AM

## 2014-11-18 NOTE — Consult Note (Signed)
Brief Consult Note: Diagnosis: dislodged G tube.   Patient was seen by consultant.   Consult note dictated.   Recommend to proceed with surgery or procedure.   Orders entered.   Comments: G-tube pulled out 2 days ago.  Tract now closed.   Will require endoscopic placement of g-tube through a new tract.  Will try to get this on the schedule for tomorrow.   Please keep npo after midnight and do not give am lovenox or anti-coagulation.  Electronic Signatures for Addendum Section:  Dow Adolphein, Ortha Metts (MD) (Signed Addendum 05-Sep-14 15:22)  correction. Plan to do procedure on Monday.  Please keep npo after Mn on Sunday and hold am lovenox or heparin on Monday morning.   Electronic Signatures: Dow Adolphein, Massiah Minjares (MD)  (Signed 05-Sep-14 14:58)  Authored: Brief Consult Note   Last Updated: 05-Sep-14 15:22 by Dow Adolphein, Kaitelyn Jamison (MD)

## 2014-11-18 NOTE — Consult Note (Signed)
Plan is for EGD with PEG tomorrow. keep npo after mn.  Hold am anti-coagulation.    Electronic Signatures: Dow Adolphein, Danikah Budzik (MD)  (Signed on 07-Sep-14 20:32)  Authored  Last Updated: 07-Sep-14 20:32 by Dow Adolphein, Joncarlo Friberg (MD)

## 2014-11-18 NOTE — Discharge Summary (Signed)
PATIENT NAME:  [name] [name], [name] [name] MR#:  [id number] DATE OF BIRTH:  1985/12/24  DATE OF ADMISSION:  03/29/2013 DATE OF DISCHARGE:  04/16/2013  DISPOSITION: Discharged to [facility].   DISCHARGE DIAGNOSES: 1. Depression with suicidal ideation.  2. Aspiration with acute hypoxic respiratory failure.  3. Anxiety.  4. Traumatic brain injury.  5. Psychosis.  6. Dysphagia.  7. Chronic pain syndrome.  8. History of pulmonary embolism.  9. 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   CONSULTANTS:  1. Dr. Excell Seltzer with surgery.  2. Dr. Jennet Maduro of psychiatry.  3. Dr. Harvie Junior with palliative care.  4. Dr. Shelle Iron with GI.  5. Dr. Laban Emperor with pain clinic.   IMAGING STUDIES: Include a chest x-ray which showed some mild atelectasis.   KUB showed nonobstructive bowel gas pattern and gastric feeding tube in the right place.   CT scan of the chest showed atelectasis ,  no pulmonary embolism.   CT scan of the head without contrast showed no acute intracranial abnormality, showed atrophy.   Please refer to interim discharge summary dictated previously on 04/14/2013.   Over the past two days, the patient has done well awaiting his placement at [facility]. His medications were continued. His pain is well controlled.   The patient does have on and off units of confusion, tries to pull his PEG tube out,  which seems to be a chronic problem as per discussion with Dr. Westly Pam at [facility].   He does not have any further suicidal ideation. Saturating well on room air.   On the day of discharge the patient is alert and awake. Lung sounds are normal, and I have discussed with patient's father and he is being discharged to [facility] for further rehab and care.   DISCHARGE MEDICATIONS: Include:  1. Multivitamin 1 tablet oral once a day.  2. Protonix 40 mg oral 2 times a day.  3. Indomethacin 75 mg oral once a day.  4. Oxycodone 10 mg oral extended  release twice a day.  5. Oxycodone 5 mg 1 tablet oral every four hours as needed for pain.  6. Acetaminophen 650 mg oral every four hours as needed for temperature or fever.  7. Quetiapine 100 mg oral once a day at bedtime.  8. Diazepam 5 mg oral 3 times a day.  9. Carbidopa, levodopa 50/200 mg 1 tablet oral 4 times a day.  10. Sertraline 100 mg 2 tablets oral once a day.  11. Hydroxyzine 1 tablet oral every six hours as needed 10 mg.  12. Lunesta 1 tablet oral once a day at bedtime as needed, 2 mg. 13. Propranolol 10 mg oral 2 times a day.  14. DuoNeb 3 mL inhaled every four hours as needed for shortness of breath and wheezing.  15. Baclofen 20 mg oral every eight hours.  16. Melatonin 3 mg 2 tablets oral once a day at bedtime.   DISCHARGE INSTRUCTIONS: The patient will be on a regular, mechanical soft pureed meat diet, dysphagia three diet. Free water 200 and q.4h. All liquids through the PEG tube. Activity will be as tolerated with assistance. Follow up with Dr. Westly Pam of [facility] within a week. The patient is a high risk for pulling his PEG tube out which he has been in the past. We will need an abdominal binder and this area needs to be covered well  so that he does not have access to it.   The patient  does have on and off episodes of confusion, which seems to be a chronic problem.   TIME SPENT: On day of discharge in discharge activity was 40 minutes.  ____________________________ Molinda BailiffSrikar R. Kross Swallows, MD srs:sg D: 04/16/2013 10:11:17 ET T: 04/16/2013 10:34:18 ET JOB#: [id number]  cc: Wardell HeathSrikar R. Eurydice Calixto, MD, <Dictator> [facility]   Orie FishermanSRIKAR R Adali Pennings MD ELECTRONICALLY SIGNED 04/19/2013 10:43

## 2014-11-18 NOTE — Consult Note (Signed)
Brief Consult Note: Diagnosis: LLE DVT.   Comments: A 29 year old gentleman with history of traumatic brain injury and quadriplegia presenting with shortness of breath and cough.  1.  Acute hypoxemic respiratory failure secondary to bronchitis. 2.  Left leg edema- s/p LLE DVT diagnosed 1 month ago. Continue Xarelto as not clear that this is treament failure. Patient at risk for DVT due to immobility.  Electronic Signatures: Antony Hasteamiah, Lindalee Huizinga S (MD)  (Signed 21-Nov-14 15:51)  Authored: Brief Consult Note   Last Updated: 21-Nov-14 15:51 by Antony Hasteamiah, Kashton Mcartor S (MD)

## 2014-11-18 NOTE — Consult Note (Signed)
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   Patient was seen by consultant.   Recommend further assessment or treatment.   Comments: Restart Xarelto and continue life long no indication for a Suprarenal IVC filter at this time will need graduated compression for cotrol of the leg edema long [id number].  Electronic Signatures: Levora DredgeSchnier, Gregory (MD)  (Signed 25-Sep-14 22:14)  Authored: Brief Consult Note   Last Updated: 25-Sep-14 22:14 by Levora DredgeSchnier, Gregory (MD)

## 2014-11-18 NOTE — Consult Note (Signed)
Brief Consult Note: Diagnosis: two open wound abd wall, no sign of fistula.   Patient was seen by consultant.   Consult note dictated.   Comments: dressing care written for.  call with any concerns.  Electronic Signatures: Natale LayBird, Wade Asebedo (MD)  (Signed 07-Sep-14 13:15)  Authored: Brief Consult Note   Last Updated: 07-Sep-14 13:15 by Natale LayBird, Juel Bellerose (MD)

## 2014-11-18 NOTE — Consult Note (Signed)
   Comments   Came by to see patient. He was sleeping and we did not wake him. Will follow up tomorrow.   Electronic Signatures: Corean Yoshimura, Daryl EasternJoshua R (NP)  (Signed 11-Sep-14 16:45)  Authored: Palliative Care   Last Updated: 11-Sep-14 16:45 by Malachy MoanBorders, Luisana Lutzke R (NP)

## 2014-11-18 NOTE — Consult Note (Signed)
Details:   - PEG inserted.  Please do not give any anti-coagulation for 24 hours.  Ok to start liquids and meds in 12 hours and tube feeds in 24 hours.  External bumper should be no tighter than 4 to 4.5 cm. - Will obtain KUB to confirm placement.   Electronic Signatures: Dow Adolphein, Matthew (MD)  (Signed 08-Sep-14 13:31)  Authored: Details   Last Updated: 08-Sep-14 13:31 by Dow Adolphein, Matthew (MD)

## 2014-11-18 NOTE — Consult Note (Signed)
Intrathecal Pump refilled today without complications. Pateint will follow-up with his regular neurologist for pump management and next refill. you for the consult.  Electronic Signatures: Odette FractionNaveira, Casper Pagliuca Arturo (MD)  (Signed on 04-Sep-14 11:07)  Authored  Last Updated: 04-Sep-14 11:07 by Odette FractionNaveira, Abeni Finchum Arturo (MD)

## 2014-11-18 NOTE — Consult Note (Signed)
PATIENT NAME:  [name] [name], GLEED MR#:  [id number] DATE OF BIRTH:  09-29-1985  PSYCHIATRY CONSULTATION REPORT  DATE OF ADMISSION:  04/22/2013 DATE OF CONSULTATION: 04/22/2013   CONSULTING PHYSICIAN:  Darrelle Wiberg S. Garnetta Buddy, MD  REQUESTING PHYSICIAN:  Huey Bienenstock, MD  REASON FOR CONSULTATION:   Suicidal ideation.   HISTORY OF PRESENT ILLNESS: The patient is a 29 year old Caucasian male, history of  traumatic brain injury, subarachnoid hemorrhage and multiple PEs, who is actually quadriplegic, admitted for depression and suicidal ideation. He pulled out his PEG tube, as he was recently placed in the [facility] nursing home. The patient also had a Foley catheter inserted, as well as a PEG tube placed. The patient was complaining of lower extremity pain and was noticed to have significant edema in the lower extremity. The patient has Dopplers, which did show extensive occluded DVTs He was telling the staff at the [facility] that he wants to die and was asking for pills.   During the interview, his mother was also present at the bedside. She reports that the patient has been telling the staff for the past couple of days that he wants to kill himself. He was becoming progressively depressed and aggressive and was trying to pull his PEG tube out. However, the patient was mumbling that he was not suicidal and he does not want to die. He reported that he was just joking. He reported that he has some problems in his legs. His mother reported that the patient has a chronic condition in which there is bony outgrowth on his legs in the muscles and the patient was looking at them, and he feels that his whole body has bony outgrowths. He feels depressed related to the same.   The patient also reported that he has multiple dvt  throughout his body. However, the patient already had filters placed at [facility], which are preventing the development of PEs. The patient is also on anticoagulation, as he has  failed  warfarin therapy in the past. He was then changed to Xarelto, which he did well, and there is a recurrence of PEs, which was discontinued, and then he expressed suicidal thoughts.   The patient is currently denying any suicidal ideations. He reported that he does not like staying at the [facility]. However, his mother reported that he has to stay at least  90 days at the [facility] and then she will take him back home. The patient stated that he wants to take the Ativan, as he does not like taking Valium. He called Ativan "the happy pill." He stated that even makes him more relaxed and he feels better on the same medication. His mother reported that he will have the full psychiatric evaluation done at the [facility], and feels that he might need some other medications besides the Ativan.   PAST PSYCHIATRIC HISTORY: The patient has a history of depression and anxiety, but he is only taking the lorazepam at this time. He was prescribed Valium in the past for muscle relaxation.   MEDICAL HISTORY: The patient has a history of traumatic brain injury with subarachnoid hemorrhage; psychosis; dysphagia; chronic pain syndrome; history of PE, status post IVC filter, history of tracheostomy, reversed; history of ileostomy, reversed; PEG tube placement.   MEDICATIONS:  Multivitamin 1 tablet through PEG 2 daily, Protonix 40 mg b.i.d., indomethacin 75 mg daily, oxycodone 10 twice daily, oxybutynin 5 mg 4 hours as needed for pain, Tylenol 650 mg as  needed, Seroquel 100 mg at bedtime, diazepam 5 mg 3 times a day, carbidopa/levodopa 50/200 mg 4 times a day, sertraline 100 mg 2 times daily, hydroxyzine 1 tablet every 6 hours as needed, Lunesta 1 tablet as needed, propranolol 10 mg 3 times a day, DuoNeb as needed, baclofen 20 mg every 8 hours, melatonin 3 mg at bedtime, baclofen pump.   ALLERGIES: AMBIEN AND DILAUDID.   SOCIAL HISTORY: [facility]. He reported that he has  2 sisters. He has a good relationship with his mother, and she is very supportive.   FAMILY HISTORY: Significant for coronary artery disease, and breast cancer in the grandmother.   ANCILLARY DATA:  VITAL SIGNS: Temperature 98.2, pulse 65, respirations 18, blood pressure 113/62.   LABORATORY STUDIES: Glucose 87, BUN 17, creatinine 0.64, sodium 139, potassium 3.7, chloride 106, bicarbonate 29, anion gap 4, calcium 8.9, protein 7.7, albumin 3.4, bilirubin 0.3, AST 15, ALT 10, WBC 8.6, RBC 4.15, hemoglobin 11.6, hematocrit 35.1, MCV 85, RDW 15.8, INR 1.1.   MENTAL STATUS EXAMINATION: The patient is a moderately-built male. He has a history of a traumatic brain injury and was lying in the bed. He was unable to communicate well and was mumbling most of the time. His speech was difficult to understand. His head was tilted to one side. He was unable to move his body. His eye contact was poor. His mood was depressed. Affect was blunted. Thought process tangential. Thought content was nondelusional. He currently denied having any suicidal or homicidal ideations or plans. He denied having any perceptual disturbances. Demonstrated poor insight and judgment.   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  AXIS II: None.  AXIS III: Please review the medical history.   TREATMENT PLAN: 1.  The patient will be started on lorazepam 1 mg p.o. q. 6 hours on a regular basis to help with his anxiety.  2.  Will continue him on Seroquel 100 mg at bedtime.  3.  Will start him on sertraline 100 mg in the morning.  4.  Will give him trazodone 100 mg at bedtime to help him with his sleep.   Thank you for allowing me to participate in the care of this patient.    ____________________________ Ardeen FillersUzma S. Garnetta BuddyFaheem, MD usf:dm D: 04/22/2013 13:40:00 ET T: 04/22/2013 14:44:39 ET JOB#: [id number]  cc: Ardeen FillersUzma S. Garnetta BuddyFaheem, MD, <Dictator>  Rhunette CroftUZMA S Tyarra Nolton MD ELECTRONICALLY SIGNED 05/06/2013 14:51

## 2014-11-18 NOTE — Consult Note (Signed)
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 The patient is hospitalized here and not likely to come out before he runs out of medicine. Pump interrogated today. Alarm date is 03/31/2013 (today). He has 2.5 mls left at 2000 mcg/ml (total=5000 mcg). He is program to receive 1210.5 mcg/day, therefore he has enough for roughly 4 days. We have ordered the medication to a compunding pharmacy. ETA is tomorrow at 0830 hrs. As soon as we receive it, we will refill the pump. you for the consult. Erlinda HongA. Damontay Alred, MD  Electronic Signatures: Odette FractionNaveira, Taytum Scheck Arturo (MD)  (Signed on 03-Sep-14 16:04)  Authored  Last Updated: 03-Sep-14 16:04 by Odette FractionNaveira, Seila Liston Arturo (MD)

## 2014-11-18 NOTE — Consult Note (Signed)
PATIENT NAME:  [name] [name], [name] [name] MR#:  [id number] DATE OF BIRTH:  [birth year]  DATE OF CONSULTATION:  04/04/2013  RFayrene HelperASON FOR CONSULTATION: Question of intracutaneous fistula.   HISTORY: A 29 year old male with a subarachnoid hemorrhage, traumatic brain injury, functional quadriplegia who [facility] in the last several months, admitted to the hospitalist service not feeling well. Consult was for a possible fistula.   MEDICATIONS, ALLERGIES, PAST MEDICAL AND SURGICAL HISTORIES: Well-defined in the chart.   PHYSICAL EXAMINATION: GENERAL: The patient is communicative. He is functionally quadriplegic.   Left upper quadrant demonstrates a healed PEG tube site with some granulation tissue. Right lower quadrant demonstrates a granulating wound with no evidence of fistula.   IMPRESSION: No evidence of fistula; open wounds as described above.   RECOMMENDATIONS: Dressing changes were written for. Please call with any questions.     ____________________________ Redge GainerMark A. Egbert GaribaldiBird, MD mab:dm D: 04/04/2013 13:17:23 ET T: 04/04/2013 13:37:15 ET JOB#: [id number]  cc: Loraine LericheMark A. Egbert GaribaldiBird, MD, <Dictator> Maki Sweetser A Brandy Zuba MD ELECTRONICALLY SIGNED 04/06/2013 10:06

## 2014-11-18 NOTE — Consult Note (Signed)
Brief Consult Note: Diagnosis: Depression.   Recommend further assessment or treatment.   Orders entered.   Comments: Could not interview tha patient. Spoke with the nurse superviser about discontinuation of sitter. The patient is no longer suicidal and does not need 1:1 for suicide.  There were some worries, in the past, about him pulling his peg tube There are no behavioral problems currently. The patient has been cool and collected. It would be preferable to have camera in the room,if the patient agrees, once sitter is discontinued.   THE PATIENT IS PSYCHIATRICALLY STABLE FOR DISCHARGE TO SNF.  PLAN: 1. Please continue current regimen. No new issues.  2. Care manager working on placement.   3. Psychiatry will follow along.  Electronic Signatures: Kristine LineaPucilowska, Hjalmar Ballengee (MD)  (Signed 11-Sep-14 17:15)  Authored: Brief Consult Note   Last Updated: 11-Sep-14 17:15 by Kristine LineaPucilowska, Cecilio Ohlrich (MD)
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
## 2014-11-18 NOTE — H&P (Signed)
PATIENT NAME:  [name] [name], [name] [name] MR#:  [id number] DATE OF BIRTH:  1986/06/02  DATE OF ADMISSION:  06/17/2013  REFERRING PHYSICIAN: Dr. Teola Bradley.  PRIMARY CARE PHYSICIAN:  None local.   CHIEF COMPLAINT: Shortness of breath.  HISTORY OF PRESENT ILLNESS:  This is a 29 year old Caucasian gentleman with past medical history of traumatic brain injury with spastic quadriplegia, history of aspiration, extensive DVTs as well as PE. He has a history of IVC filter placed,  is presenting with cough. He was noted to be desaturating at his nursing facility at [facility]. He has had a 2 to 3 day duration of cough that which has been productive of yellowish-brown sputum with associated shortness of breath, however, denies any fevers or chills. He was noted to be hypoxemic today with saturation in the low 80s on room air. With this new finding, he presents to [facility] for further workup and evaluation. Once again, denies any fevers, chills, unknown recent sick contacts, but does live in a nursing facility. Denies any chest pain. Of note, his mother he states that she has noticed lower extremity swelling of the left leg new which is new onset of one day duration which is worse than it chronically is. He has no sensation, so he is unable to describe if there is any pain associated with this. Currently, he is without complaints, though in the Emergency Department he was found to be hypoxemic, requiring supplemental O2.   REVIEW OF SYSTEMS:  CONSTITUTIONAL: Denies fever, fatigue, weakness.  EYES: Denies blurred vision, double vision, eye pain.  ENT: Denies tenderness, ear pain, hearing loss.  RESPIRATORY: Positive for cough as described above. Positive for cough and shortness of breath. Denies any wheeze.  CARDIOVASCULAR: Denies chest pain, palpitations.  GASTROINTESTINAL: Denies nausea, vomiting, diarrhea. GENITOURINARY:  Denies dysuria, hematuria.  ENDOCRINE: Denies nocturia or thyroid problems.  HEMATOLOGY:  Denies easy bruising or bleeding.   SKIN: Denies rash or lesions.  MUSCULOSKELETAL: Denies current pain in neck, back, shoulder, knees or hips, any arthritic symptoms.  NEUROLOGIC: Positive for paralysis, which is chronic.  PSYCHIATRIC: Denies any anxiety or depressive symptoms currently.  Otherwise, full review of systems performed by me is negative.   PAST MEDICAL HISTORY: Of anxiety, depression, traumatic brain injury with history of subarachnoid hemorrhage, dysphagia, chronic pain syndrome, history of PE now status post IVC filter, history of extensive DVTs.   FAMILY HISTORY: Positive for coronary artery disease.   SOCIAL HISTORY: Denies tobacco, alcohol or drug usage. Currently resides at [facility] nursing facility. Has dysphagia diet, mechanical soft and honey thick liquids.   ALLERGIES: AMBIEN AND DILAUDID.   HOME MEDICATIONS: Acetaminophen 325 2 tabs by mouth q.4 hours as needed for pain or fever, DuoNeb treatments q.4 hours as needed for shortness of breath, Ativan 1 mg p.o. daily for anxiety, baclofen 20 mg p.o. q.8 hours, Lasix 20 mg p.o. daily, melatonin 3 mg 2 tabs p.o. at bedtime, multivitamin 1 tablet p.o. daily, oxycodone 5 mg by mouth q.4 hours as needed for pain, OxyContin 10 mg p.o. b.i.d., pantoprazole 40 mg p.o. b.i.d., propranolol 10 mg p.o. b.i.d. quetiapine 150 mg p.o. at bedtime, sertraline 100 mg 2 tabs p.o. daily, Sinemet 25/100 mg 2 tabs p.o. q.6 hours, temazepam 30 mg p.o. at bedtime, Trileptal 600 mg by mouth b.i.d., Xarelto 20 mg p.o. daily.   PHYSICAL EXAMINATION: VITAL SIGNS: Temperature 98.4, heart rate 78, respirations 18, blood pressure 94/53, saturating 94% on supplemental O2.  GENERAL: Well-nourished, well-developed Caucasian gentleman who  is currently in no acute distress.  HEAD: Normocephalic, atraumatic.  EYES: Pupils equal, round, reactive to light. Extraocular muscles intact. No scleral icterus.  MOUTH: Moist mucous membranes. Dentition intact. No  abscess noted.  EARS, NOSE AND THROAT:  Throat clear without exudates. No external lesions.  NECK: Supple. No thyromegaly. No nodules. No JVD.  PULMONARY: Scant rhonchi bilaterally. No use of accessory muscles. Good respiratory effort.   CHEST: Nontender to palpation.  CARDIOVASCULAR: S1, S2, regular rate and rhythm. No murmurs, rubs or gallops. Pedal pulses 2+ bilaterally, edema of 2+ right lower extremity, 3+ left lower extremity throughout.   GASTROINTESTINAL: Soft, nontender, nondistended. No masses. Positive bowel sounds. No hepatosplenomegaly.  MUSCULOSKELETAL: No swelling or clubbing. Positive for edema as described above; 3+ left lower extremity, 2+ right lower extremity. The left lower extremity also appears to have erythema from foot to knee. Range of motion:  The patient is contracted, unable to fully assess secondary to medical condition.  NEUROLOGIC: Cranial nerves II through XII intact. He has poor sensation secondary to  quadriplegia, unable to fully assess reflexes.  SKIN: No ulcerations, lesions, rashes or cyanosis. Skin warm, dry, turgor is intact.  PSYCHIATRIC: Mood and affect within normal limits. The patient is sleeping on arrival, but easily arousable, oriented x 3. Insight and judgment intact.   LABORATORY DATA: Sodium 136, potassium 4, chloride 102, bicarb 27 BUN 16, creatinine 0.68, glucose 114.  LFTs within normal limits. WBC 6.9, hemoglobin 13.1, platelets 241, INR 1.5.   URINALYSIS: 31, WBC is 1,032, RBCs trace leukocyte esterase, positive nitrate, less than 1 epithelial cell.   CT of the chest performed revealing thickened airways with mosaic lung pattern concerning for bronchitis, however, no PE.   ASSESSMENT AND PLAN: A 29 year old gentleman with history of traumatic brain injury and quadriplegia presenting with shortness of breath and cough.  1.  Acute hypoxemic respiratory failure secondary to bronchitis. Provide supplemental O2 to keep oxygen saturation greater  than 92%. DuoNeb therapy q.4 hours, flutter valve as well as chest PT.  2.  Left leg edema, concerning for repeat deep vein thrombosis despite Xarelto  therapy. We will check a Doppler. If this is a new onset, we will need adjustment of therapy. If chronic,  we will need symptomatic treatment only.  3.  Urinary tract infection. Treat him with ceftriaxone which will cover for bronchitis as well as urinary tract infection. 4.  Anxiety and depression. Continue with quetiapine and Zoloft.  5.  Deep venous thrombosis, continue with Xarelto.  6.  The patient is Full Code as discussed with mother at bedside.  TIME SPENT: 45 minutes    ____________________________ Cletis Athensavid K. Hower, MD dkh:NTS D: 06/17/2013 00:25:09 ET T: 06/17/2013 00:44:42 ET JOB#: [id number]  cc: Cletis Athensavid K. Hower, MD, <Dictator> DAVID Synetta ShadowK HOWER MD ELECTRONICALLY SIGNED 06/17/2013 3:06

## 2014-11-18 NOTE — Consult Note (Signed)
Brief Consult Note: Diagnosis: Depression.   Patient was seen by consultant.   Recommend further assessment or treatment.   Orders entered.   Comments: The patient reports feeling better today. He is not suicidal or homicidal. He feels "happy, happy, happy". He complains of pain in his feet and jaw. He hopes Ketamine would help. He slept well last night. No behavioral problems.   THE PATIENT IS PSYCHIATRICALLY STABLE FOR DISCHARGE TO SNF.  PLAN: 1. Please continue current regimen. No new issues.  2. Care manager working on placement.   3. Psychiatry will follow along..  Electronic Signatures: Kristine LineaPucilowska, Jolanta (MD)  (Signed 11-Sep-14 12:14)  Authored: Brief Consult Note   Last Updated: 11-Sep-14 12:14 by Kristine LineaPucilowska, Jolanta (MD)

## 2014-11-18 NOTE — Consult Note (Signed)
Brief Consult Note: Diagnosis: Depression.   Comments: I was unable to see the patient today. Will return tomorrow am.  Electronic Signatures: Kristine LineaPucilowska, Marya Lowden (MD)  (Signed 01-Sep-14 17:59)  Authored: Brief Consult Note   Last Updated: 01-Sep-14 17:59 by Kristine LineaPucilowska, Mackinzee Roszak (MD)

## 2014-11-18 NOTE — Consult Note (Signed)
Chief Complaint:  Subjective/Chief Complaint Family meeting with myself, patient, mother & Dr Servando SnareWohl.   VITAL SIGNS/ANCILLARY NOTES: **Vital Signs.:   26-Sep-14 12:15  Vital Signs Type Routine  Temperature Temperature (F) 97.8  Celsius 36.5  Pulse Pulse 88  Respirations Respirations 18  Systolic BP Systolic BP 124  Diastolic BP (mmHg) Diastolic BP (mmHg) 78  Mean BP 93  Pulse Ox % Pulse Ox % 96  Pulse Ox Activity Level  At rest  Oxygen Delivery Room Air/ 21 %   Assessment/Plan:  Assessment/Plan:  Assessment Displaced PEG:  Foley in place.  Dr Servando SnareWohl discussed at great length various options for nutrition.  Discussed concerns about PEG being removed again by pt.  He has had a binder and did remove it before as he has good right arm strength.  Last speech path study 2 mo with frequent aspiration per mom.  On honeythick liquids.  Dr Servando SnareWohl also encouraged mother to discuss with her son his desires in this situation for continued PEG feeds since he continues to remove tube.  After discussion, decision has been made by mother to consider replacement low profile PEG.  Dr Servando SnareWohl will need a mature fistula tract in order to do this.  He would like foley to remain in place to develop tract for at least 6 weeks.  At that point, if pt & family desire low-profile PEG placement we will arrange through our office.  Dr Servando SnareWohl also discussed w/ attending Dr Allena KatzPatel.   Plan Family to call in 5-6 weeks to consider low profile peg placement in mature tract Leave foley in tract in interim Will sign off, call if any questions.   Electronic Signatures: Joselyn ArrowJones, Nekisha Mcdiarmid L (NP)  (Signed 26-Sep-14 15:41)  Authored: Chief Complaint, VITAL SIGNS/ANCILLARY NOTES, Assessment/Plan   Last Updated: 26-Sep-14 15:41 by Joselyn ArrowJones, Tamika Shropshire L (NP)

## 2014-11-18 NOTE — Op Note (Signed)
PATIENT NAME:  [name] [name], Eleazar MR#:  [id number] DATE OF BIRTH:  05/22/1986  DATE OF PROCEDURE:  01/08/2013  PREOPERATIVE DIAGNOSIS: Gastrostomy tube removal.   POSTOPERATIVE DIAGNOSIS: Gastrostomy tube removal.   OPERATION: Replacement of gastrostomy tube.   ANESTHESIA: None.   SURGEON: Carmie Endalph L. Ely III, M.D.    OPERATIVE PROCEDURE: The patient had inadvertently removed his 14-French gastrostomy tube, and it could not be replaced by the ED physician. He requested assistance. The tract appeared to be open and available for the tube placement. I could not identify in the operating theater any gastrostomy tube dilator, so I took some urethral dilators and dilated the incision to 18-French. A 14-French catheter was then placed without difficulty, the balloon inflated and the wound taped. The patient was then discharged per the ED doctor.   ____________________________ Carmie Endalph L. Ely III, MD rle:gb D: 01/08/2013 01:29:11 ET T: 01/08/2013 01:51:58 ET JOB#: [id number]  cc: Quentin Orealph L. Ely III, MD, <Dictator> Quentin OreALPH L ELY MD ELECTRONICALLY SIGNED 01/08/2013 6:23

## 2014-11-18 NOTE — Consult Note (Signed)
Details:   - G-tube is 20 Fr and is removable without endoscopy ( internal mushroom bolster).  The external bolster should be no tighter than 4.5 cm to avoid buried bumper and skin breakdown.   GI will sign off.   Please call with questions.   Electronic Signatures: Dow Adolphein, Matthew (MD)  (Signed 10-Sep-14 16:52)  Authored: Details   Last Updated: 10-Sep-14 16:52 by Dow Adolphein, Matthew (MD)

## 2014-11-18 NOTE — Discharge Summary (Signed)
PATIENT NAME:  [name] [name], Lathan MR#:  [id number] DATE OF BIRTH:  [birth year]  DATE OF ADMISSION:  06/17/2013 DATE OF DISCHARGE:  06/23/2013  DISCHARGE DIAGNOSES:  1.  Left lower lobe pneumonia, possible aspiration.  2.  Quadriplegia.  3.  Traumatic brain injury with subarachnoid hemorrhage.  4.  Chronic dysphagia.  5.  Chronic pain syndrome.  6.  Deep venous thrombosis left lower extremity with inferior vena cava filter.  7.  Anxiety/depression.  8.  Heterotopic calcification of the hips.   DISCHARGE MEDICATIONS: Multivitamin 1 daily, Protonix 40 mg b.i.d., Tylenol 650 mg q.4 hours p.r.n. pain, propranolol 10 mg q.12 hours, baclofen 20 mg q.8 hours, oxycodone 5 mg q.4 hours p.r.n. pain, Lasix 20 mg daily, Xarelto 20 mg daily, Trileptal 600 mg b.i.d., Seroquel 150 mg at bedtime, sertraline 200 mg q.a.m., Sinemet 25/100, 2 tabs q.6 hours, oxycodone ER 15 mg b.i.d., Ativan 1 mg t.i.d., temazepam 30 mg at bedtime, Duo-Neb SVN q.i.d., cefuroxime 250 mg b.i.d. x 1 week, Levaquin 500 mg daily x 7 days, melatonin 10 mg at bedtime and Mucinex 600 mg b.i.d.   REASON FOR ADMISSION: A 29 year old male, who presents with cough, congestion, wheezing and mild hypoxia. Please see H and P for HPI, past medical history and physical exam.   HOSPITAL COURSE: The patient was admitted, found to have left lower lobe pneumonia. Swallow evaluation showed that he did tolerate honey thick liquids fairly well. He came off oxygen, his white count normalized and he became afebrile. He will be maintained on Levaquin and Ceftin for another week Will need the Duo-Neb long-term as he has trouble clearing his airway. His left lower extremity edema with his DVT did improve with elevation and Lasix. He maintains Xarelto. There was really no other further treatment of that DVT. Chest CT showed no pulmonary embolus. His pain was controlled with the addition of guiafenesin plus increasing his oxycodone ER to 15 mg twice a day. Overall  prognosis is guarded. He sleeps well with the Seroquel, melatonin and temazepam.  ____________________________ Danella PentonMark F. Lonie Newsham, MD mfm:aw D: 06/23/2013 08:04:06 ET T: 06/23/2013 08:55:29 ET JOB#: [id number]  cc: Danella PentonMark F. Joclynn Lumb, MD, <Dictator> Dresden Lozito Sherlene ShamsF Meliza Kage MD ELECTRONICALLY SIGNED 06/23/2013 14:35

## 2014-11-18 NOTE — Consult Note (Signed)
Details:   - No abd pain today.  Only reporting some leg pain.   Exam:  PEG site appears c/d/i.  No abd tenderness or distension  Plan:  I loosened the external bumber to 4.5 cm to prevent skin ulcer or buried bumper - ok to advance diet to full now and start fluids or TF through g-tube - the external bumper should not be made any tighter than 4.5 cm.   Electronic Signatures: Dow Adolphein, Cooper Moroney (MD)  (Signed 09-Sep-14 12:45)  Authored: Details   Last Updated: 09-Sep-14 12:45 by Dow Adolphein, Jalissa Heinzelman (MD)

## 2014-11-18 NOTE — Discharge Summary (Signed)
PATIENT NAME:  [name] [name], [name] [name] MR#:  [id number] DATE OF BIRTH:  01-05-1986  DATE OF ADMISSION:  04/22/2013 DATE OF DISCHARGE:  04/24/2013   CONSULTANTS: 1. Uzma S. Garnetta Buddy, MD, from psychiatry. 2. Speech therapy. 3. Renford Dills, MD, from vascular surgery.  CHIEF COMPLAINT: Displaced PEG tube.  DISCHARGE DIAGNOSES: 1. Bilateral extensive occlusive deep venous thrombosis extending from bilateral common femoral veins to the popliteal veins. 2. 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 3. Displaced percutaneous endoscopic gastrostomy tube.  4. History of depression. 5. Anxiety. 6. Traumatic brain injury with subarachnoid hemorrhage. 7. Psychosis. 8. History of dysphagia. 9. History of pulmonary embolism status post inferior vena cava filter. 10. History of tracheostomy, which was reversed. 11. History of ileostomy, which was reversed. 12. Percutaneous endoscopic gastrostomy tube. 13. Suicidal ideation. 14. Insomnia. 15. History of quadriplegia. 16. Chronic pain syndrome.  DISCHARGE MEDICATIONS: 1. Multivitamin 1 tab daily. 2. Pantoprazole 40 mg daily. 3. Indomethacin 75 mg daily. 4. Acetaminophen 325 two tabs every 4 hours as needed. 5. Quetiapine 100 mg daily. 6. Carbidopa/levodopa 50/200 one tab 4 times a day. 7. Sertraline 100 mg 2 tabs once a day. 8. Hydroxyzine 10 mg every 6 hours as needed for itching. 9. Propranolol 10 mg every 12 hours. 10. Nebulizers inhaled p.r.n.  11. Baclofen 20 mg every 8 hours. 12. Melatonin 3 mg 2 tabs once a day at bedtime. 13. Oxycodone 10 mg extended release 1 tab every 12 hours. 14. Oxycodone 5 mg every 4 hours as needed for pain. 15. Xarelto 15 mg 2 times a day with meals for 19 days more, then change to 20 mg dose daily indefinitely. 16. Diazepam 5 mg 3 times a day. 17. Lunesta 3 mg at bedtime as needed for insomnia.  DIET: Regular. Consistency is mechanical soft, honey-thick liquids. Strict aspiration precautions to include  sitting fully upright. Medications in puree. Tray setup and any assistance at meals as necessary. Add gravy to meat to moisten and flavor. Magic Cup ice cream as indicated per MD for supplement.   FOLLOWUP:  1. Please follow with PCP within 1 to 2 weeks.  2. Please follow with Dr. Servando Snare, GI physician, within 1 to 2 weeks.  DISPOSITION: Back to rehab.  HISTORY OF PRESENT ILLNESS AND HOSPITAL COURSE: For full details of H and P, please see the dictation on September 25 by Dr. Randol Kern, but briefly, this is an unfortunate 29 year old male with history of traumatic brain injury, subarachnoid hemorrhage, multiple PEs and IVC filter placement, quadriplegic, with PEG, who was recently discharged from [facility] for hypoxic respiratory failure, depression and suicidal ideation to [facility]. He apparently pulled out his PEG tube, and the patient had a Foley catheter inserted in the PEG tube site in the ED.   He had some lower extremity pain and some edema and underwent a Doppler showing DVTs. He was admitted to the hospitalist service. He has history of PE in the past, where he had IVC filter inserted then at that time. He was on anticoagulation. He had failed warfarin therapy, and he was changed to Xarelto, but now he is off. He was admitted to the hospitalist service, initiated on heparin drip, and vascular surgery was consulted. He was transitioned to Xarelto.   In regards to his displaced PEG, he has had a binder in the past and did remove it before. He was seen by speech specialist as well. He is on honey-thick liquids. He was seen by psychiatry as well. He, per  psychiatry, does not have any active suicidal ideation or intent. He did have a sitter, but that was discontinued. Dr. Servando SnareWohl needs mature fistula tract in order to put in a replacement low-profile PEG, and would need to follow as an outpatient. We are to leave the Foley currently as is. Family is to call in 5 to 6 weeks per GI to consider  low-profile PEG placement in the mature tract.   In regards to the DVT, he should be on anticoagulation lifelong per vascular. He does benefit from abdominal binders to avoid pulling the PEG off. His Lunesta was increased for his insomnia per family request, and at this time, he will be discharged back to facility.  PHYSICAL EXAMINATION: VITAL SIGNS: On day of discharge, his temperature was 99.1, pulse rate was 92, respiratory rate 20, blood pressure 96/64, O2 sat 92% on room air.  GENERAL: The patient is a well-developed Caucasian male lying in bed, not obviously distressed.  HEENT: Shows pink conjunctivae. Ears intact. Oropharynx is clear. NECK: Supple.  LUNGS: Normal respiratory effort. No use of accessory muscles. Lungs are clear anteriorly on examination of his lungs. CARDIAC-WISE: There is no murmur. Regular rate and rhythm. ABDOMEN: Soft, nontender. PEG site is clean. Dressing intact.  EXTREMITIES: He has some lower extremity edema. GENITOURINARY: Has a condom catheter.  NEUROLOGIC: He does have bilateral paraplegia. He has moderate upper extremity weakness and has decreased power in the lower extremities.  At this time, he will be discharged back to rehab facility.  TOTAL TIME SPENT: 40 minutes.  CODE STATUS: Full code.   ____________________________ [name] EatonShayiq Criston Chancellor, MD sa:OSi D: 04/24/2013 11:04:00 ET T: 04/24/2013 11:22:18 ET JOB#: [id number]  cc: [name] EatonShayiq Matyas Baisley, MD, <Dictator> [name] EatonSHAYIQ Michel Hendon MD ELECTRONICALLY SIGNED 05/13/2013 14:09

## 2014-11-18 NOTE — Consult Note (Signed)
Brief Consult Note: Diagnosis: Depression.   Patient was seen by consultant.   Recommend further assessment or treatment.   Orders entered.   Comments: The patient feels better today but became very agitated and irrate when not allowed to have a pizza that his mother ordered for him. There are worries that he will pull his tubes. Mood is improving. Sleep is better.  PLAN: 1. Will continue sitter.  2. Care manager working on placement.   3. Will continue Abilify for depression and Seroquel for mood stabilization and sleep in additon to zoloft.  4. will follow along.  Electronic Signatures: Kristine LineaPucilowska, Mallarie Voorhies (MD)  (Signed 08-Sep-14 16:37)  Authored: Brief Consult Note   Last Updated: 08-Sep-14 16:37 by Kristine LineaPucilowska, Louvina Cleary (MD)

## 2014-11-18 NOTE — H&P (Signed)
PATIENT NAME:  [name] [name], Homero MR#:  [id number] DATE OF BIRTH:  1985/12/04  DATE OF ADMISSION:  06/30/2013  CHIEF COMPLAINT: Shortness of breath.   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 Recently admitted, 11/26, with complaints of pain, continued evidence of DVT, evidence of aspiration pneumonitis. He has been sent out on a dysphagia 3 with honey-thick liquid diet. He was reported to have episodes of desaturation and increased shortness of breath and cough and was brought in from the facility where he resides. He admits noncompliance with his diet, including use of ice cream and other thin liquids. He was observed drinking out of a straw in the Emergency Room with his sats falling while he was doing this, but improving once he stopped drinking out of the straw. Chest x-ray reveals increased markings in the left lower lobe, which is where we have seen an infiltration for the patient in the past with aspiration. He is on multiple medications for chronic pain syndrome, apparently recently underwent bone scanning at an outside facility; those results are not available. His course in the past has been complicated by psychiatric issues including suicidal ideation, and he has been followed by psychiatry with multiple medication adjustments for this.   PAST MEDICAL HISTORY: 1. 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  2.  Chronic pain syndrome with heterotopic ossification.  3.  Anxiety/depression with history of psychiatric admission.  4.  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   ALLERGIES: AMBIEN AND  DILAUDID.   MEDICATIONS:  1.  DuoNeb SVNs 4 times a day.  2.  Baclofen 20 mg p.o. t.i.d. 3.  Guaifenesin 600 mg p.o. b.i.d.  4.  Lasix 20 mg p.o. daily.  5.  Lorazepam 1 mg p.o. t.i.d.  6.  Melatonin 10 mg p.o. at bedtime.  7.  Multivitamin 1 p.o. daily.  8.  Oxycodone 5 mg p.o. q.4 hours as needed for pain.  9.  OxyContin 15 mg p.o. b.i.d.  10.  Pantoprazole 40 mg p.o. b.i.d.  11.  Propranolol 10 mg p.o. b.i.d.  12.  Seroquel 150 mg p.o. at bedtime.  13.  Sertraline 200 mg p.o. daily.  14.  Sinemet 25/100, 2 tablets p.o. q.6 hours.  15.  Temazepam 30 mg p.o. at bedtime.  16.  Trileptal 600 mg p.o. b.i.d.  17.  Xarelto 20 mg p.o. daily.   SOCIAL HISTORY: No alcohol or tobacco is reported.   FAMILY HISTORY: Coronary artery disease.   REVIEW OF SYSTEMS:  Please see HPI. No fevers, chills. Some cough. Family members present, confirmed the above. Challenging to keep the patient on task regarding current symptoms, as he tends to gravitate towards his pain and desire for pain medications and sleep medications.   PHYSICAL EXAMINATION: VITAL SIGNS: Temperature 100, pulse 86, blood pressure 143/87, saturation 95% on 2 liters nasal cannula, 85% on room air on arrival.  GENERAL: Young male, no acute distress, some baseline dysarthria, talking in complete sentences.  EYES: Disconjugate gaze, baseline.  EARS, NOSE, THROAT: External examination unremarkable.  Oropharynx is moist without lesions.  NECK:  Post trach changes. CARDIOVASCULAR: Regular rate, rhythm without murmurs, gallops, rubs.  LUNGS:  A few crackles were in the bases, particularly on the left more than the right without wheeze or retraction.  ABDOMEN: Soft, nontender, positive bowel sounds. No guard, rebound.  SKIN: No significant rashes or nodules.  LYMPH NODES: No cervical or supraclavicular nodes.  MUSCULOSKELETAL: Increased spasticity is noted bilateral lower extremities and the left arm. Edema is noted throughout the left  lower extremity with hyperemia on that side compared to the right side.  NEUROLOGIC: Disconjugate gaze and dysarthria as noted above. Movement in the right upper extremity without movement noted in the left upper extremity or the bilateral lower extremities with increased spasticity as noted above.   DATA: Chest x-ray reveals streaky atelectasis versus infiltrate in left lower lobe without evidence of edema. Glucose 96, BUN 15, creatinine 0.73. Total protein 8.4 with liver enzymes not elevated. White count 6.7 with hemoglobin 14.4 and platelets 314.   IMPRESSION AND PLAN: 1.  Acute respiratory failure/recurrent aspiration pneumonitis. The patient is on Xarelto already, has IVC filter in place, and has had a CT scan within the last 2 weeks; negative for PE. He had similar symptoms when this was performed, and so seems less likely pulmonary embolism evident, though must keep this in mind. Does have increased markings in the left lower lobe and had visualized hypoxia with drinking, which would be consistent with aspiration. He also has confirmed noncompliance with his liquids particularly. We will resume dysphagia 3 with honey-thick liquids, and the importance to stay on this diet is emphasized.  Empiric steroids, continue DuoNeb SVNs, place on Zosyn for now with consideration for change back over to Ceftin pending how he does with the above.  2.  Chronic pain syndrome. Continue his previous pain regimen and regimen for spasticity. Again, bone scan was recently performed at an outside facility, and hopefully those results will be available to further guide therapy, but this will be left to the discretion of his primary care physician.  3.  Anxiety/depression. Maintain his home medications at previous dose. He had been tolerating with Ativan and temazepam, and so we will continue these for now.     ____________________________ Lynnea Ferrier, MD bjk:dmm D: 06/30/2013 20:17:24 ET T: 06/30/2013 20:38:14  ET JOB#: [id number]  cc: Lynnea Ferrier, MD, <Dictator> Daniel Nones MD ELECTRONICALLY SIGNED 07/12/2013 22:16

## 2014-11-18 NOTE — Consult Note (Signed)
PATIENT NAME:  [name] [name], [name] [name] MR#:  [id number] DATE OF BIRTH:  [birth year]  DATE OF CONSULTATION:  04/22/2013  REFERRING PHYSICIAN:  Phillips Climes, MD CONSULTING PHYSICIAN: Lucilla Lame, MD / Andria Meuse, NP  PRIMARY CARE PHYSICIAN: Nonlocal.   REASON FOR CONSULTATION: Pulled out PEG tube.   HISTORY OF PRESENT ILLNESS: [name] is a 29 year old Caucasian male with a history of traumatic brain injury, subarachnoid hemorrhage, multiple pulmonary emboli status post IVC, paraplegia and admitted this time for extensive DVTs. He pulled out his PEG tube around 6:30 p.m. yesterday. His mom states he says he was trying to lose weight. He has had some intermittent vomiting over the past couple weeks, but denies any abdominal pain, heartburn, indigestion or change in bowel habits. His last PEG was placed 04/05/2013 by Dr. Rayann Heman. He had 1 ulcer at the greater curvature of the old PEG site. A 20-French MIC G-tube was placed at that time due to him pulling out his PEG. Previous PEG was placed by Dr. Pat Patrick in June 2014 as he pulled his PEG out at that time as well. His mom's concern is wondering what can be done to keep from having problems with PEG on an ongoing basis.   PAST MEDICAL AND SURGICAL HISTORY: Traumatic brain injury, subarachnoid hemorrhage, multiple PEs status post IVC filter, DVT, paraplegia.   MEDICATIONS PRIOR TO ADMISSION:  1.  Acetaminophen 325 mg 2 tablets q. 4 hours p.r.n. 2.  Albuterol 2.5/0.5 mg/3 mL inhaled p.r.n.  3.  Baclofen 20 mg q. 8 hours. 4.  Carbidopa/levodopa 50/200 mg q.i.d. 5.  Diazepam 5 mg t.i.d.  6.  Eszopiclone 2 mg 1 tablet at bedtime. 7.  Hydroxyzine 10 mg q. 6 hours p.r.n. itching. 8.  Indomethacin 75 mg at bedtime. 9.  Melatonin 3 mg 2 tablets at bedtime. 10.  Multivitamin daily. 11.  Oxycodone 10 mg extended release q. 12 hours. 12.  Oxycodone 5 mg q. 4 hours p.r.n.  13.  Pantoprazole 40 mg b.i.d. 14.  Propranolol 10 mg q. 12 hours. 15.  Quetiapine 100 mg at  bedtime. 16.  Sertraline 100 mg 2 tablets daily in the morning   ALLERGIES: AMBIEN CAUSES ALTERED MENTAL STATUS AND DILAUDID CAUSES HIVES.   FAMILY HISTORY: Noncontributory.   SOCIAL HISTORY: He resides at [facility]. No tobacco, alcohol or illicit drug use.   REVIEW OF SYSTEMS:  See HPI.  He has otherwise negative 10 point review of systems.   PHYSICAL EXAMINATION: VITAL SIGNS: Temperature 97.6, pulse 64, respirations 13, blood pressure 96/48.  GENERAL: He is alert, pleasant and cooperative. His mother is at the bedside.  HEENT: Sclerae clear, anicteric. Conjunctivae pink. Oropharynx pink and moist.  NECK: Supple without any mass or thyromegaly. He has head tilt to the right.  LUNGS: Clear to auscultation bilaterally.  ABDOMEN: He has clear fluid. Foley is intact and PEG site is clear without discharge or exudate. No erythema. Abdomen has positive bowel sounds, is soft, nontender and nondistended without palpable mass or hepatosplenomegaly. No rebound, tenderness or guarding.  EXTREMITIES: He has contractures lower extremities, paraplegic.  SKIN: Pink, warm and dry.  PSYCHIATRIC: He is alert, cooperative, oriented to self.  NEUROLOGICAL: He appears to be at his baseline neurologic status.   LABORATORY STUDIES: ALT 10, otherwise normal LFTs. MET-7 is normal. Hemoglobin 11.6 and hematocrit 35, otherwise normal CBC. INR 1.1. PTT 60.4.   IMPRESSION: [name] [name] is a pleasant 29 year old Caucasian male with traumatic brain injury who has pulled out his  PEG tube last night. He was admitted with extensive deep venous thromboses, on heparin. He has a Foley catheter in place at the tube site. This is a new PEG tube site placed by Dr. Rayann Heman just 04/05/2013 as he pulled out previously on more than one occasion. Mother is concerned that he will continue to remove the PEG tube himself and would like to discuss further options. I have discussed this case with Dr. Lucilla Lame and our recommendations are  below.   PLAN: 1.  PEG tube will be replaced by Dr. Allen Norris tomorrow.  2.  He should have an abdominal binder in place at all times to prevent removal of the tube.  We would like to thank you for allowing [place] to participate in the care of Mr. Horgan.  This services provided by Andria Meuse, NP under collaborative agreement with Dr. Lucilla Lame.  ____________________________ Andria Meuse, NP klj:sb D: 04/22/2013 13:10:55 ET T: 04/22/2013 13:22:01 ET JOB#: [id number]  cc: Andria Meuse, NP, <Dictator> Andria Meuse FNP ELECTRONICALLY SIGNED 04/27/2013 14:32

## 2014-11-18 NOTE — Consult Note (Signed)
PATIENT NAME:  [name] [name], [name] [name] MR#:  [id number] DATE OF BIRTH:  [birth year]  DATE OF CONSULTATION:  04/01/2013  REFERRING PHYSICIAN: Dr. Milagros LollSrikar Sudini.    CONSULTING PHYSICIAN:  Dow AdolphMatthew Rein, MD  REASON FOR THE CONSULT: I pulled out my PEG tube.   HISTORY OF THE PRESENT ILLNESS: [name] [name] is a 29 year old male with a history of traumatic brain injury, subarachnoid hemorrhage, multiple PEs, paraplegia, who GI is currently consulted on for replacement of a PEG tube. [name] [name] reports that he pulled his PEG tube out  approximately 1-1/2 days ago because he was suicidal at that time. He reports no longer being suicidal. Per the chart record I cannot exactly determine when the PEG tube was was pulled out. This consult is focused strictly on the issues surrounding the PEG tube.   PAST MEDICAL HISTORY.  1. Traumatic brain injury.  2. Subarachnoid hemorrhage.  3. Multiple PE status post IVC filter.  4. History of PEG tube.  5. Paraplegia.   ADMISSION MEDICATIONS:  1. Acetaminophen/hydrocodone 325/5 q. 6 hours p.r.n.  2. Baclofen 20 mg at bedtime.  3. Bromocriptine 2.5 mg 2 times b.i.d.  4. Calcium plus vitamin D 600  1 tab p.o. daily.  5. Carbidopa, levodopa 50/200 p.o. 4 times daily.  6. Diazepam 5 mg t.i.d.  7. Indomethacin 75 mg at bedtime.  8. Lidocaine topical patches q.12 hours.  9. Melatonin 5 mg at bedtime.  10. Multivitamin 1 tab daily.  11.  Pantoprazole 40 mg b.i.d.  12.  Propranolol 20 mg 3 times a day.  13. Sertraline 100 mg 2 tabs daily.  14. Temazepam 30 mg at bedtime.  15.  Tramadol 50 mg t.i.d.   FAMILY HISTORY: Is breast cancer in his grandmother.   SOCIAL HISTORY: No alcohol, tobacco or recreational drugs.   ALLERGIES: AMBIEN AND DILAUDID.    PHYSICAL EXAMINATION:  VITAL SIGNS: Are stable. He is afebrile.  GENERAL: He appears to be in mild distress. He is bedbound. He is alert and oriented and able to communicate  CARDIOVASCULAR: Is regular. No murmurs, rubs, or  gallops.  LUNGS: Are clear to auscultation. No wheezes or crackles.  ABDOMEN: Is soft, nontender, nondistended. His PEG tube site is closed. There is some granulation tissue but there is no opening seen.  EXTREMITIES: He has no swelling.   LABORATORY DATA: Sodium 139, potassium 4.4, chloride 105, bicarbonate 32, BUN 19, creatinine 0.69. Liver enzymes are normal. His white count is 10.1, hemoglobin 13, hematocrit 39, platelets are 277 and INR is 1.0.   ASSESSMENT AND PLAN: Displaced PEG tube: The patient reports for pulling his PEG tube 1-1/2 days ago. Unfortunately, the tract is now closed. Therefore, will require placement of a fresh PEG tube in an adjacent site. We will plan to do this on Monday. Please keep the patient n.p.o. Sunday night and also do not give any anticoagulation in the morning on Monday, such as Lovenox or heparin.  ____________________________ Dow AdolphMatthew Rein, MD mr:sg D: 04/02/2013 15:30:39 ET T: 04/02/2013 15:49:46 ET JOB#: [id number]  cc: Dow AdolphMatthew Rein, MD, <Dictator> Kathalene FramesMATTHEW G REIN MD ELECTRONICALLY SIGNED 04/07/2013 19:16

## 2014-11-18 NOTE — Consult Note (Signed)
Brief Consult Note: Diagnosis: PEG removed per pt.   Patient was seen by consultant.   Consult note dictated.   Comments: [name] is a pleasant 29 y/o caucasian male with TBI who has pulled out his PEG tube last night.  He was admitted with extensive DVTs & was started on heparin.  He has a foley cath in place of tube at site.  This was a new peg tube & site placed by Dr Shelle Ironein just 04/05/13 as he has pulled it previously on more than 1 occasion.  Mother is concerned that he will continue to remove himself & would like to discuss other options.  I will discuss further options with Dr Servando SnareWohl & make recommendations.  Thanks for consult.  Please see full dictated note. #[id number].  Electronic Signatures: Joselyn ArrowJones, Shron Ozer L (NP)  (Signed 25-Sep-14 13:12)  Authored: Brief Consult Note   Last Updated: 25-Sep-14 13:12 by Joselyn ArrowJones, Ecko Beasley L (NP)

## 2014-11-18 NOTE — H&P (Signed)
PATIENT NAME:  [name] [name], [name] [name] MR#:  [id number] DATE OF BIRTH:  1985/11/16  DATE OF ADMISSION:  03/29/2013  PRIMARY CARE PHYSICIAN: Nonlocal.   REFERRING PHYSICIAN: Dr. 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 He was having increased agitation. They noticed some purple discoloration of his lips as well as eyelids as well as shortness of breath. He however denies any cough, fevers or chills. He recently has been changed from honey-thick liquids to nectar thick liquids, although the family denied any witnessed coughing fits after eating. In the Emergency Department, he was found to be hypoxemic saturating into the mid 80s on room air. He was placed on nasal cannula 2 liters with improvement up to the low 90s. In the Emergency Department, he was given vanc, Zosyn and Solu-Medrol. Chest x-ray and CT of the chest were performed with CT chest revealing air trapping, no infiltrate or no PE.   REVIEW OF SYSTEMS: CONSTITUTIONAL: Denies any fevers, fatigue or weight changes. EYES: Denies any vision changes or loss of visual acuity. ENT AND MOUTH: Denies any oral lesions or difficulty swallowing. RESPIRATORY: Denies any cough, wheeze or shortness of breath, although noted by family. CARDIOVASCULAR: Denies any chest pain, palpitations. GASTROINTESTINAL: Denies any nausea, vomiting, diarrhea, abdominal pain. GENITOURINARY: Denies any dysuria. ENDOCRINE: Denies any nocturia or thyroid problems. HEME AND LYMPH: Denies any anemia, easy bruising or bleeding. SKIN: Denies any lesions or rashes. MUSCULOSKELETAL: Complains of chronic lower extremity  pain. NEUROLOGIC: Denies any numbness, however, he is paraplegic. PSYCH: The patient states he is anxious, which is chronic. The patient is a poor historian.   FAMILY HISTORY: Significant for cardiovascular disease as well as breast cancer in grandmother.   SOCIAL HISTORY: No alcohol, tobacco or drug usage. He is wheelchair bound. Resides with his parents.   ALLERGIES: AMBIEN AND DILAUDID WITH AMBIEN CAUSING NAUSEA AND DILAUDID CAUSING A RASH.   HOME MEDICATIONS: Include: 1.  Acetaminophen/hydrocodone 325/5 mg q. 6 hours p.r.n. for severe pain. 2.  Baclofen 20 mg p.o. at bedtime. 3.  Bromocriptine 2.5 mg 2 tabs b.i.d. 4.  Calcium plus D 600/800 one tab p.o. daily. 5.  Carbidopa/levodopa 50/200 p.o. 4 times daily. 6.  Cranberry oral 1 tablet p.o. daily. 7.  Diazepam 5 mg t.i.d. 8.  Indomethacin 75 mg p.o. at bedtime. 9.  Lidocaine topical patches 2 patches to affected area 12 hours on and 12 hours off. 10.  Melatonin 5 mg p.o. at bedtime. 11.  Multivitamin 1 tab p.o. daily. 12.  Pantoprazole 40 mg p.o. b.i.d. 13.  Peridex 0.12% solution 15 mL to mucous membranes once daily. 14.  Propranolol 20 mg p.o. t.i.d. 15.  Sertraline 100 mg p.o. 2 tabs daily. 16.  Temazepam 30 mg p.o. at bedtime. 17.  Tramadol 50 mg p.o. t.i.d.   PHYSICAL EXAMINATION: VITAL SIGNS: Temperature 97.6, heart rate 88, respirations 18, blood pressure 123/74 and saturating 95% on 2 liters nasal cannula. GENERAL: In no acute distress. He is awake and alert, able to respond to simple yes and no answers. HEENT: Normocephalic, atraumatic. Well healed scar from craniotomy. No oral lesions noted. HEART: S1, S2 regular rate and rhythm. No murmurs,  rubs or gallops. LUNGS: Clear to auscultation bilaterally without wheezes, rubs or rhonchi. ABDOMEN: Soft, nontender and nondistended. Positive bowel sounds. PEG tube in place without surrounding erythema, ileostomy site which has been recently reversed. No surrounding erythema or  drainage. EXTREMITIES: No cyanosis, edema or clubbing. He is paralyzed in the lower extremities. NEUROLOGIC: Cranial nerves II through XII intact. Paralysis as above.   LABORATORY AND DIAGNOSTICS: Sodium 139, potassium 4.4, chloride 105, bicarb 32, BUN 19, creatinine 0.69, glucose 101. Magnesium 1.7. Total protein 8, albumin 3.7, bili 0.3, alk phos 94, AST 16, ALT 13. Troponin I 0.02. TSH 1.84. WBC 10, hemoglobin 13.3 and platelets 277. Urinalysis: Trace leukocyte esterase, nitrite negative.   EKG: Normal sinus rhythm, heart rate 80. No ST-T abnormalities.   CT of the chest performed with contrast: No evidence of PE. Scattered areas of subsegmental atelectasis as well as air trapping.   ASSESSMENT AND PLAN: A 29 year old gentleman with past medical history of traumatic brain injury, subarachnoid hemorrhage, history of pulmonary emboli and now with IVC filter in place, as well as a history of having an ileostomy. Ileostomy was put down yesterday. Presenting with 1 day duration of not feeling well. According to his family, some increased agitation, discoloration of lips and eyelids, as well as shortness of breath. He was found to be hypoxemic saturating in the mid 80s on room air in the Emergency Department. He was given vanc, Zosyn and Solu-Medrol. CT revealed air-trapping with no definitive infiltrate and no pulmonary embolus. 1.  Hypoxemic respiratory insufficiency/dyspnea of unclear etiology. No evidence of pulmonary embolus at this time or infiltrate. I suspect he may have had some component of aspiration without any evidence of pneumonia. He will be provided supplemental oxygen to keep O2 saturations round 90%, and DuoNeb treatments and incentive spirometry. Given lack of fever, cough, infiltrate or leukocytosis feel there is no need for antibiotics at this time. 2.  Dysphagia. Place on honey-thick liquid diet. 3.  Chronic pain. Continue his Baclofen and tramadol, his home medications. 4.  Anxiety  and depression, not otherwise specified. Continue with Valium and Zoloft, his home medications. 5.  Traumatic brain injury. Continue his Sinemet. 6.  Gastroesophageal reflux disease. Continue his pantoprazole. 7.  Deep venous thrombosis prophylaxis with heparin sub-Q.   The patient is FULL CODE.   TOTAL TIME SPENT: 55 minutes. ____________________________ Aaron Mose. Melissa Pulido, MD dkh:sb D: 03/28/2013 23:19:09 ET T: 03/29/2013 08:52:19 ET JOB#: [id number]  cc: Aaron Mose. Lliam Hoh, MD, <Dictator> Ashlin Hidalgo Woodfin Ganja MD ELECTRONICALLY SIGNED 03/29/2013 23:52

## 2014-11-18 NOTE — Consult Note (Signed)
Brief Consult Note: Diagnosis: Depression.   Patient was seen by consultant.   Recommend further assessment or treatment.   Orders entered.   Comments: The patient feels better today. I met with his mother and sister. The family wants placement but not all agree. He does admit to feeling suicidal. I do not believe that he has the means to do it. In the past he tried to strangle himself with his hands.   PLAN: 1. Will continue sitter.  2. Care manager working on placement.   3. Will continue Abilify in additon to zoloft.  4. will follow along.  Electronic Signatures: Orson Slick (MD)  (Signed 04-Sep-14 17:45)  Authored: Brief Consult Note   Last Updated: 04-Sep-14 17:45 by Orson Slick (MD)

## 2014-11-18 NOTE — H&P (Signed)
PATIENT NAME:  [name] [name], BENNIS MR#:  [id number] DATE OF BIRTH:  Dec 30, 1985  DATE OF ADMISSION:  04/22/2013  REFERRING PHYSICIAN:  Dr. Mariea Clonts.  PRIMARY CARE PHYSICIAN:  Nonlocal.  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, mother at bedside and she gives most of the history, she reports the patient is known to have history of PE in the past at [facility], where he had IVC filter inserted then, as well he was on anticoagulation where he failed warfarin therapy as she says and he was then changed to Xarelto which he did well with and there was recurrence of his PE so it was discontinued, as well upon presentation the patient expressed to ED staff that he wanted to harm himself, that is why he pulled his PEG tube, but later on he denied any suicidal ideation to ED staff, the patient denies any chest pain, any shortness of breath, complaining of lower extremity pain.   REVIEW OF SYSTEMS:   CONSTITUTIONAL:  The patient denies fever, chills.  EYES:  Denies blurry vision, double vision. RESPIRATORY:  Denies cough, wheezing, shortness of  breath. GASTROINTESTINAL:  Denies nausea, vomiting.  Complains of diarrhea. GENITOURINARY:  Denies any dysuria.  ENDOCRINE:  Denies polyuria or polydipsia.  HEMATOLOGY:  Denies easy bruising.  SKIN:  Denies any skin lesions or rash. MUSCULOSKELETAL:  Complains of lower extremity pain.  NEUROLOGIC:  Denies any numbness. PSYCHIATRIC:  The patient is anxious.  Expressed wishes to hurt himself to ED staff upon presentation.  FAMILY HISTORY:  Significant for coronary artery disease and breast cancer in a grandmother.  SOCIAL HISTORY:  No alcohol, no tobacco, no drug use.   ALLERGIES:  As per history from previous medical records, [facility].  PAST MEDICAL HISTORY:   1.  Depression.  2.  Anxiety. 3.  Traumatic brain injury with subarachnoid hemorrhage.  4.  Psychosis. 5.  Dysphagia.  6.  Chronic pain syndrome.  7.  History of PE status post IVC filter. 8.  History of tracheostomy, reversed.  9.  History of ileostomy, reversed.  10.  PEG tube.  HOME MEDICATIONS:  1.  Multivitamin one tablet through PEG daily. 2.  Protonix 40 mg two times a day. 3.  Indomethacin 75 mg daily. 4.  Oxycodone 10 mg, 10 mL twice a day. 5.  Oxycodone 5 mg every 4 hours as needed for pain. 6.  Tylenol 650 as needed. 7.  Quetiapine 100 mg at bedtime. 8.  Diazepam 5 mg three times a day. 9.  Carbidopa/levodopa 50/200 one tablet four times a day. 10.  Sertraline 100 mg two times a day. 11.  Hydroxyzine 1 tablet oral every 6 hours as needed. 12.  Lunesta one tablet at bedtime as needed 2 mg. 13.  Propranolol 10 mg oral three times a day. 14.  DuoNebs as needed. 15.  Baclofen 20 mg every eight hours.  16.  Melatonin 3 mg two tablets at bedtime.  17.  The patient is on baclofen pump which was last time filled on September 3rd by Dr. Dossie Arbour, usually followed at [facility]:   VITAL SIGNS:  Temperature 98.2, pulse 65, respiratory rate 18, blood pressure 113/62,  saturating 96% on room air.  GENERAL:  Young male lies comfortable in bed in no apparent distress.  HEENT:  Head atraumatic, normocephalic, has a scar from craniotomy, well-healed.  No oral lesions.   NECK:  Supple.  No thyromegaly.  Had old tracheostomy scar in site.  CARDIOVASCULAR:  S1, S2 heard.  No rubs, murmur.  Rhythm regular.  LUNGS:  Had good air entry bilaterally.  No wheezing, rales, rhonchi.   ABDOMEN:  Soft, nontender, nondistended.  Bowel sounds present.  Had Ace wrap around his abdomen, had the site of his previous PEG tube so he cannot pull on it.  Currently he has Foley inserted inside.  No discharge.  No bleed from site.  Has a small site of his previous ileostomy which is covered with bandage with very minimal serosanguineous material inside, but no bleed or no purulent discharge.   EXTREMITIES:  Has +3 edema in the left, +1 to 2 in the right.  NEUROLOGIC:  Cranial nerves grossly intact.  No motor strength at the right lower extremity.  Left upper extremity contracted and 1 to 2 out of 5 motor strength.  Right upper extremity 3 to 4 strength in the right upper. LYMPHATIC:  No cervical or supraclavicular lymphadenopathy.   PERTINENT LABORATORY DATA:  Glucose 87, BUN 17, creatinine 0.64, sodium 139, potassium 3.7, chloride 106, CO2 29, ALT 10, AST 15, alk phos 106.  White blood cell 8.6, hemoglobin 11.6, hematocrit 35.1, platelets 305.  Urinalysis negative for leukocyte esterase and nitrite.    IMAGING STUDIES:  Bilateral lower extremity duplex showing extensive occlusive DVT extending from bilateral common femoral veins through the popliteal vein and occlusive thrombus of the inferior vena cava.    ASSESSMENT AND PLAN:   1.  Deep vein thrombosis, the patient appears to be having extensive lower extremity deep vein thrombosis with occlusive thrombus in the inferior vena cava, he already has an IVC filter, the patient will be started on a heparin drip, as well we will consult vascular  surgery to see if there is any need for any intervention at this point, at one point when the patient is more stable he can be switched to Xarelto as the patient's mother reports he has been tried on warfarin for a long period of time without reaching therapeutic INR level, report it was very hard to control with warfarin and he was doing good on Xarelto.   2.  Displaced PEG tube.  We will consult GI service to replace it in a.m.  Meanwhile, he will be kept nothing by mouth.  3.  Suicidal thoughts and ideations, the patient will be kept on suicide precaution, will have a sitter and we will consult psychiatry service.  4.  History of depression and anxiety.  We will resume medications when we are able to do this through PEG.  5.  Dysphagia.  He will be kept nothing by mouth until his PEG is inserted.   6.  Chronic pain syndrome.  The  patient is having a fentanyl drip which was refilled recently, we will keep him on as needed morphine until he is able to take by mouth.  7.  DVT prophylaxis.  The patient is on full dose anticoagulation. 8.  GI prophylaxis.  We will keep him on IV Protonix until able to resume his Protonix through PEG.  9.  CODE STATUS:  Discussed with the mother.  She reports he is a FULL CODE.    Total time spent on admission and patient care 55 minutes.   ____________________________ Albertine Patricia, MD dse:ea D: 04/22/2013 02:11:03 ET T: 04/22/2013 02:36:58 ET JOB#: [id number]  cc: Albertine Patricia, MD, <Dictator> DAWOOD Graciela Husbands MD ELECTRONICALLY SIGNED 04/24/2013 5:56

## 2014-11-18 NOTE — Consult Note (Signed)
Brief Consult Note: Diagnosis: Depression.   Patient was seen by consultant.   Recommend further assessment or treatment.   Orders entered.   Comments: The patient difficult to interview. He is somnolent today, more so that yesterday per sitter report. he does admit to feeling suicidal.  PLAN: 1. Will continue sitter.  2. Will contact family.  3. Will start Abilify in additon to zoloft.  4. will follow along.  Electronic Signatures: Kristine LineaPucilowska, Isael Stille (MD)  (Signed 02-Sep-14 17:25)  Authored: Brief Consult Note   Last Updated: 02-Sep-14 17:25 by Kristine LineaPucilowska, Bryann Gentz (MD)

## 2014-11-18 NOTE — Consult Note (Signed)
Brief Consult Note: Comments: Pt interviewed today in front of his mother. He admantly denied that he is NOT suicidal and was joking and has no plans to harm slef and other. Mother also agreed with the plans. We discussed at length about his meds and that he needed full psychitaric evaluation, [facility]. He also requested change in Meds to Lorazepam as his mother reported that he thnks "its a happy pill".   Pt sitter was discontinued. As I was finishing my dictation, his mother came back upset and stated that, how can I decide that he is not suicidal. However, pt is not suicidal and will d/c sitter and case discussed with staff and will continue to moniotr.  Electronic Signatures: Rhunette CroftFaheem, Audris Speaker S (MD)  (Signed 25-Sep-14 13:56)  Authored: Brief Consult Note   Last Updated: 25-Sep-14 13:56 by Rhunette CroftFaheem, Carrigan Delafuente S (MD)

## 2014-11-18 NOTE — Discharge Summary (Signed)
PATIENT NAME:  [name] [name], Cathan MR#:  [id number] DATE OF BIRTH:  1986/01/03  DATE OF ADMISSION:  06/30/2013 DATE OF DISCHARGE:  07/05/2013  DISCHARGE DIAGNOSES: 1.  Recurrent aspiration pneumonia with hypoxia. 2.  Traumatic brain injury with functional quadriplegia.  3.  Chronic pain syndrome with heterotopic ossification.  4.  Anxiety/depression.  5.  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   DISCHARGE MEDICATIONS: 1.  DuoNeb SVN q.i.d.  2.  Baclofen 20 mg t.i.d.  3.  Guaifenesin 600 mg b.i.d.  4.  Lasix 20 mg daily. 5.  Lorazepam 1 mg t.i.d.  6.  Melatonin 10 mg at bedtime. 7.  Multivitamin daily.  8.  Oxycodone 5 mg q. 4 hours p.r.n. pain. 9.  OxyContin 15 mg b.i.d.  10.  Pantoprazole 40 mg b.i.d.  11.  Propranolol 10 mg b.i.d.  12.  Seroquel 150 mg at bedtime.  13.  Sertraline 200 mg daily. 14.  Sinemet 25/100 mg 2 tabs q. 6. 15.  Temazepam 30 mg at bedtime.  16.  Trileptal 600 mg b.i.d.  17.  Pradaxa 150 mg b.i.d.   REASON FOR ADMISSION: A 29 year old male who presents with hypoxia and recurrent aspiration. Please see H and P for HPI, past medical history, and physical exam.   HOSPITAL COURSE: The patient was admitted. There were plans to do a chest CT to rule out PE, but the patient refused. The pharmacy said that the Trileptal was interacting with the Xarelto and he was switched to Pradaxa. He was on IV Zosyn with normalization of his oxygenation. He does not adhere to his diet and eats his own food and drinks non-honey thick liquids. Thus he is instructed to not use any of his own food and to be sitting up with aspiration precautions with honey thick liquids. His fever normalized. His oxygenation came back to baseline. Overall prognosis is poor. ____________________________ Danella PentonMark F. Unknown Schleyer, MD mfm:sb D: 07/05/2013 07:59:13 ET T: 07/05/2013 08:41:42 ET JOB#: [id number]  cc: Danella PentonMark F. Zauria Dombek, MD, <Dictator> Ndrew Creason  Sherlene ShamsF Ameya Kutz MD ELECTRONICALLY SIGNED 07/05/2013 17:40

## 2014-11-18 NOTE — Consult Note (Signed)
General Aspect cool discolored left leg   Present Illness Called to ER to see patient regarding a cool left leg associated with discoloration.  The patient has also been dealing with hematuria for the last several days.  He is on Pradaxa which has not been stopped.  He has extensive history of DVT in the left leg which is the indication for his anticoagulation and infact has propagated clot on several anticoagulants including Heparin, Coumadin and Xarelto.  At this time the patient denies pain in the left leg.  He was recently started on Lasix for treatment of increased swelling.  This has not had any + impact per the mother.  The patient is nonambulatory and has severe brain injury.  PAST MEDICAL HISTORY: 1. 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  2.  Chronic pain syndrome with heterotopic ossification.  3.  Anxiety/depression with history of psychiatric admission.  4.  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   Home Medications: Medication Instructions Status  pantoprazole 40 mg oral delayed release tablet 1 tab(s) orally 2 times a day Active  propranolol 10 mg oral tablet 1 tab(s) orally every 12 hours Active  acetaminophen 325 mg oral tablet 2 tab(s) (650 mg) orally every 4 hours, As Needed - for Pain or fever Active  multivitamin 1 tab(s) orally once a day Active  LORazepam 1 mg oral tablet 1 tab(s) orally 3 times a day Active  temazepam 30 mg oral capsule 1 cap(s) orally once a day (at bedtime) Active  baclofen 20 mg oral tablet 1 tab(s) orally every 8 hours Active  oxyCODONE 5 mg oral tablet 1 tab(s) orally every 4 hours, As Needed - for Pain Active  Lasix 20 mg oral tablet 1 tab(s) orally once a day Active  Trileptal 600 mg oral tablet 1 tab(s) orally 2 times a day Active  QUEtiapine 100 mg oral tablet 1 tab(s) orally once a day (at bedtime) along  with quetiapine 50 mg Active  QUEtiapine 50 mg oral tablet 1 tab(s) orally once a day (at bedtime) along with quetiapine 100 mg Active  sertraline 100 mg oral tablet 2 tab(s) (200 mg) orally once a day Active  Sinemet 25 mg-100 mg oral tablet 2 tab(s) (50/200 mg) orally every 6 hours Active  guaiFENesin 600 mg oral tablet, extended release 1 tab(s) orally every 12 hours Active  Melatonin 5 mg oral tablet 2 tab(s) (10 mg) orally once a day (at bedtime) Active  albuterol-ipratropium 2.5 mg-0.5 mg/3 mL inhalation solution 3 milliliter(s) (1 vial) inhaled 4 times a day Active  OxyCONTIN 15 mg oral tablet, extended release 1 tab(s) orally every 12 hours Active  Actonel 35 mg oral tablet 1 tab(s) orally every 7 days for heterophobic occification Active  MS Contin 15 mg/12 hr oral tablet, extended release 1 tab(s) orally 2 times a day for pain Active  indomethacin 75 mg oral capsule, extended release 1 cap(s) orally once a day for osteoarthritis Active  Pradaxa 150 mg oral capsule 1 cap(s) orally 2 times a day Active    Ambien: Alt Ment Status  Dilaudid: Hives  Case History:  Family History Non-Contributory   Review of Systems:  Fever/Chills No   Cough No   Sputum No   Abdominal Pain No   Diarrhea No   Constipation No   Nausea/Vomiting No   SOB/DOE No   Chest Pain No  Telemetry Reviewed NSR   Physical Exam:  GEN well developed, ill and debilitated appearing   HEENT hearing intact to voice, moist oral mucosa   NECK supple  trachea midline  well healed trach scar   RESP normal resp effort  postive use of accessory muscles   CARD regular rate  no JVD   ABD denies tenderness  nondistended   EXTR positive cyanosis/clubbing, positive edema, right leg normal with good pulses; left leg with edema and blue discoloration pedal pulses by doppler   SKIN tight to palpation   NEURO positive rigidity, R side weakness, L side weakness, aphasic   PSYCH alert, agitated    Hepatic:  31-Dec-14 18:26   Bilirubin, Total 0.2  Alkaline Phosphatase 96 (45-117 NOTE: New Reference Range 06/18/13)  SGPT (ALT) 12  Total Protein, Serum 7.8  Albumin, Serum 3.8  Routine Chem:  31-Dec-14 18:26   Glucose, Serum 91  BUN 12  Creatinine (comp) 0.76  Sodium, Serum 137  Chloride, Serum 101  CO2, Serum  34  Calcium (Total), Serum 8.9  Osmolality (calc) 273  eGFR (African American) >60  eGFR (Non-African American) >60 (eGFR values <76mL/min/1.73 m2 may be an indication of chronic kidney disease (CKD). Calculated eGFR is useful in patients with stable renal function. The eGFR calculation will not be reliable in acutely ill patients when serum creatinine is changing rapidly. It is not useful in  patients on dialysis. The eGFR calculation may not be applicable to patients at the low and high extremes of body sizes, pregnant women, and vegetarians.)  Anion Gap  2  Routine UA:  31-Dec-14 16:59   Color (UA) Yellow  Clarity (UA) Clear  Glucose (UA) Negative  Bilirubin (UA) Negative  Ketones (UA) Negative  Specific Gravity (UA) 1.010  Blood (UA) 3+  pH (UA) 6.0  Protein (UA) Negative  Nitrite (UA) Negative  Leukocyte Esterase (UA) Negative (Result(s) reported on 28 Jul 2013 at 05:24PM.)  RBC (UA) 885 /HPF  WBC (UA) 2 /HPF  Bacteria (UA) NONE SEEN  Epithelial Cells (UA) NONE SEEN  Hyaline Cast (UA) 1 /LPF (Result(s) reported on 28 Jul 2013 at 05:24PM.)  Routine Coag:  31-Dec-14 18:26   Activated PTT (APTT)  39.6 (A HCT value >55% may artifactually increase the APTT. In one study, the increase was an average of 19%. Reference: "Effect on Routine and Special Coagulation Testing Values of Citrate Anticoagulant Adjustment in Patients with High HCT Values." American Journal of Clinical Pathology 2006;126:400-405.)  Prothrombin 14.2  INR 1.1 (INR reference interval applies to patients on anticoagulant therapy. A single INR therapeutic range for coumarins is not  optimal for all indications; however, the suggested range for most indications is 2.0 - 3.0. Exceptions to the INR Reference Range may include: Prosthetic heart valves, acute myocardial infarction, prevention of myocardial infarction, and combinations of aspirin and anticoagulant. The need for a higher or lower target INR must be assessed individually. Reference: The Pharmacology and Management of the Vitamin K  antagonists: the seventh ACCP Conference on Antithrombotic and Thrombolytic Therapy. ZOXWR.6045 Sept:126 (3suppl): [id number]. A HCT value >55% may artifactually increase the PT.  In one study,  the increase was an average of 25%. Reference:  "Effect on Routine and Special Coagulation Testing Values of Citrate Anticoagulant Adjustment in Patients with High HCT Values." American Journal of Clinical Pathology 2006;126:400-405.)  Routine Hem:  31-Dec-14 18:26   WBC (CBC) 5.6  RBC (CBC) 5.12  Hemoglobin (CBC) 14.0  Hematocrit (CBC) 42.5  Platelet Count (CBC)  209 (Result(s) reported on 28 Jul 2013 at 06:43PM.)  MCV 83  MCH 27.4  MCHC 33.0  RDW  15.8    Impression 1.  Phlegmasia of the left leg, I do not believe that this a pure arterial process.  The patient denies pain in this limb and it is enlarged and rutty in color (not pale).  Given that he has hematuria and now appears to have propagated thrombus while on Pradaxa.  Recommend Heparin gtt no bolus and follow the nomogram.  At DC I would suggest Lovenox as he has failed other oral anticoagulants.  Elevate the foot of the bed.  I have discussed in great detail wth the patient's mother that given the extensive nature of the phlebitis his leg may not be salvagable and that he would not be an interventional candidate nor a surgical candidate.  He has not had any recent desaturations so I do not feel tha ta CTA will help.  His right leg is essentially normal suggesting the IVC filter is patent. 2.  Chronic pain syndrome. Continue his  previous pain regimen and regimen for spasticity. Again, bone scan was recently performed at an outside facility, and hopefully those results will be available to further guide therapy, but this will be left to the discretion of his primary care physician.  3.  Anxiety/depression. Maintain his home medications at previous dose. He had been tolerating with Ativan and temazepam, and so we will continue these for now.   4.  chronic respiratory failure/recurrent aspiration pneumonitis. continue aerosol treatments, monitor O2 sats 5.  hematuria plan per urology monitor H/H   Plan level 4 consult   Electronic Signatures: Hortencia Pilar (MD)  (Signed 31-Dec-14 19:39)  Authored: General Aspect/Present Illness, Home Medications, Allergies, History and Physical Exam, Labs, Impression/Plan   Last Updated: 31-Dec-14 19:39 by Hortencia Pilar (MD)

## 2014-11-19 NOTE — Discharge Summary (Signed)
PATIENT NAME:  [name] [name], [name] [name] MR#:  [id number] DATE OF BIRTH:  1986-01-14  DATE OF ADMISSION:  07/28/2013 DATE OF DISCHARGE:  08/02/2013  DISCHARGE DIAGNOSES: 1.  Right ureteral calculus with hematuria, resolved.  2.  Possible left lower extremity arterial thromboembolism.  3.  Functional quadriplegia.  4.  Traumatic brain injury with subarachnoid hemorrhage.  5.  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  6.  Anxiety/depression.  7.  History of pulmonary embolus with IVC filter in place.   DISCHARGE MEDICATIONS: 1.  Multivitamin 1 daily. 2.  Pantoprazole 40 mg b.i.d.  3.  Tylenol 650 mg q. 4 p.r.n.  4.  Propranolol 10 mg b.i.d.  5.  Baclofen 20 mg q. 8. 6.  Oxycodone 5 mg q. 4 p.r.n.  7.  Lasix 20 mg daily. 8.  Trileptal 600 mg b.i.d. 9.  Seroquel 150 mg at bedtime.  10.  Zoloft 200 mg q. a.m. 11.  Sinemet 25/100 mg 2 tabs q. 6. 12.  Lorazepam 1 mg t.i.d.  13.  Temazepam 30 mg at bedtime.  14.  Mucinex 600 mg b.i.d.  15.  Melatonin 10 mg at bedtime.  16.  DuoNeb SVN 3 mL q.i.d.  17.  OxyContin 15 mg b.i.d.  18.  Actonel 35 mg weekly. 19.  Indomethacin 75 mg daily. 20.  Pradaxa 150 mg b.i.d.  21.  Flomax 0.4 mg daily.  22.  Colace 100 mg b.i.d.   REASON FOR ADMISSION: A 29 year old male who presents with significant hematuria and duskiness of the left leg. Please see H and P for HPI, past medical history, and physical exam.   HOSPITAL COURSE: The patient was admitted. Abdominal CT showed right ureteral calculus. Chest CT showed no PE. The hematuria did clear with fluids and time. Over concern for left lower extremity arterial disease, IV heparin was started. Dr. Gilda CreaseSchnier of vascular thought that was likely not the issue. At first there was thought about switching him over to Lovenox, but the likelihood of him partially tolerating that and accepting that was quite low and without a definitive diagnosis of arterial clot he was switched back to the Pradaxa, his oral medication.  Overall prognosis is guarded.  ____________________________ Danella PentonMark F. Jyair Kiraly, MD mfm:sb D: 08/02/2013 06:58:13 ET T: 08/02/2013 09:17:30 ET JOB#: [id number]  cc: Danella PentonMark F. Jasey Cortez, MD, <Dictator> Danella PentonMARK F Cacie Gaskins MD ELECTRONICALLY SIGNED 08/02/2013 17:32

## 2014-11-19 NOTE — Op Note (Signed)
PATIENT NAME:  [name] [name], [name] [name] MR#:  [id number] DATE OF BIRTH:  1985/11/07  DATE OF PROCEDURE:  07/02/2014  PREOPERATIVE DIAGNOSIS: Need for gastrostomy, failed percutaneous endoscopic gastrostomy tube.   POSTOPERATIVE DIAGNOSIS: Need for gastrostomy, failed percutaneous endoscopic gastrostomy tube.   OPERATIVE PROCEDURE: Janeway gastrostomy.   OPERATING SURGEON: Earline MayotteJeffrey W. Mirabelle Cyphers, M.D.   ASSISTANMarland Kitchen: Kathreen CosierS. G. Sankar, M.D.    ANESTHESIA: General endotracheal under Dr. Darleene CleaverVan Staveren.   ESTIMATED BLOOD LOSS: Less than 25 mL.   CLINICAL NOTE: This 29 year old male with traumatic brain injury has had multiple PEG tube placements, all of which have been pulled out by the patient. He has failed a trial to go without a PEG with an inability to maintain his nutrition and medications. Attempted PEG placement yesterday was unsuccessful, as the gastric body could not be brought up against the anterior abdominal wall. He is brought to the operating room, at this time, for planned Jan[place]ay gastrostomy.   The patient received Kefzol prior to the procedure.   OPERATIVE NOTE: After the induction of general anesthesia, the abdomen was prepped with ChloraPrep and draped. An upper midline incision was made and carried down through the skin and subcutaneous tissue with hemostasis achieved by electrocautery. The abdomen was entered. There were no adhesions of the stomach to the anterior abdominal wall. The stomach was grasped and a tube created making use of a GIA 75 stapler from the midportion of the gastric body. This was based on the greater curvature. This was then brought up through a small incision through the rectus with a dime-sized area of skin removed. The gastric tube was sewn to the fascia with 3-0 Vicryl and the stomach sewn to the undersurface of the peritoneum with similar suture. The staple line was opened and an 2818 JamaicaFrench tube passed without difficulty. The gastric tube was then matured as a Brooke  ileostomy-type closure.   The fascia was closed with a single layer of 0 Prolene figure-of-eight sutures. The adipose layer was closed with 2-0 Vicryl, and the skin closed with staples. The gastrostomy tube was placed to closed drainage. The balloon for the gastrostomy was cut to prevent inflation and possible rupture of the stomach should the catheter be pulled out by the patient. A binder was placed to protect the surgery site and the patient taken to the recovery room in stable condition.    ____________________________ Earline MayotteJeffrey W. Detrice Cales, MD jwb:JT D: 07/04/2014 08:50:03 ET T: 07/04/2014 09:22:06 ET JOB#: [id number]  cc: Earline MayotteJeffrey W. Lorette Peterkin, MD, <Dictator> Danella PentonMark F. Miller, MD Junius Faucett Brion AlimentW Rithwik Schmieg MD ELECTRONICALLY SIGNED 07/04/2014 10:06

## 2014-11-19 NOTE — Consult Note (Signed)
Details:   - GI Note:  To do MIC-KEY low profile PEG, need to coordinate with surgery.   PEG will likely be done on Thursday.   Ok to keep to take PO tonight and tomorrow.   Electronic Signatures: Dow Adolphein, Matthew (MD)  (Signed 01-Dec-15 17:23)  Authored: Details   Last Updated: 01-Dec-15 17:23 by Dow Adolphein, Matthew (MD)

## 2014-11-19 NOTE — Discharge Summary (Signed)
PATIENT NAME:  [name] [name], [name] [name] MR#:  [id number] DATE OF BIRTH:  1985-11-27  DATE OF ADMISSION:  04/24/2014 DATE OF DISCHARGE:  04/27/2014    DISCHARGE DIAGNOSES:  1.  Multiresistant Klebsiella urinary tract infection.  2.  Aspiration pneumonia.  3.  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  4.  Quadriplegia.   DISCHARGE MEDICATIONS: Pantoprazole 40 mg b.i.d., sertraline 100 mg 2 tablets every morning, melatonin 10 mg at bedtime, indomethacin 75 mg every morning, Flomax 0.4 mg daily, Colace 100 mg b.i.d., trazodone 50 mg b.i.d., morphine 15 mg extended release tablets b.i.d., baclofen 20 mg t.i.d. Xarelto 20 mg every evening, propranolol XR 60 mg every evening, gabapentin 600 mg t.i.d., oxycodone 5 mg q. 4 hours p.r.n., Macrobid 100 mg b.i.d. x 10 days.   REASON FOR ADMISSION: A 29 year old male presents with fever and a multi resistant urinary tract infection. Please see H and P for HPI, past medical history, and physical exam.   HOSPITAL COURSE: The patient was admitted. He had known multi resistant Klebsiella that was sensitive to only Macrodantin and meropenem. He received 3 days of IV meropenem. He was scheduled to receive more than that, but he lost his IV access and adamantly refused PICC line. He will be going home with family for 10 days of Macrobid and then 4-6 weeks of suppressive nitrofurantoin 100 mg daily. The family understands this is not a really a long term plan. He was found to have a right lower lobe pneumonia, very small, likely aspiration in nature. Speech therapy recommended honey-thick liquids with regular diet, and the family understands.  Overall prognosis is guarded.    ____________________________ Danella PentonMark F. Citlaly Camplin, MD mfm:MT D: 04/27/2014 08:12:46 ET T: 04/27/2014 08:59:56 ET JOB#: [id number]  cc: Danella PentonMark F. Abigail Marsiglia, MD, <Dictator> Norena Bratton Sherlene ShamsF Fedrick Cefalu MD ELECTRONICALLY SIGNED 04/28/2014 8:17
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
## 2014-11-19 NOTE — Consult Note (Signed)
Pump readout taken yesterday and medication ordered to compounding pharmacy. As soon as it becomes available, we will refill the pump.  Electronic Signatures: Odette FractionNaveira, Alistair Senft Arturo (MD)  (Signed on 17-Nov-15 10:34)  Authored  Last Updated: 17-Nov-15 10:34 by Odette FractionNaveira, Channa Hazelett Arturo (MD)

## 2014-11-19 NOTE — Op Note (Signed)
PATIENT NAME:  [name] [name], [name] [name] MR#:  [id number] DATE OF BIRTH:  02-09-1986  DATE OF PROCEDURE:  07/01/2014  PREOPERATIVE DIAGNOSIS: Need for gastrostomy tube placement.   POSTOPERATIVE DIAGNOSIS: Need for gastrostomy tube placement.  OPERATIVE PROCEDURE: Attempted PEG gastrostomy tube placement.   OPERATING SURGEON: Donnalee CurryJeffrey Rosario Kushner, MD   ASSISTANT: Dow AdolphMatthew Rein, MD  ANESTHESIA: Monitored anesthesia care, Xylocaine 1% plain 10 mL local infiltration.   CLINICAL NOTE: This 29 year old male had traumatic brain injury and has pulled out multiple gastrostomy tubes. Attempts to go without this for nutrition and medication use has failed. He is felt to be a candidate for attempted reinsertion.   The patient received Kefzol prior to the procedure. Upper endoscopy was completed by Dr. Dow AdolphMatthew Rein and will be dictated separately.   There was evidence of scarring on the anterior abdominal wall from his previous tube placements. This area was prepped with ChloraPrep after brief visualization of the endoscopic light. There was faint indentation on the anterior wall of the stomach at this area. After local anesthesia was infiltrated, the guidewire needle was passed and this appeared to come in on the side of the stomach. Where the guidewire could be passed easily, attempts to pass the stay suture were unsuccessful. The guidewire was removed with the idea of moving it more anteriorly and at this point it was not possible to really visualize the light anymore and the impulse from the index finger on the anterior abdominal wall was unimpressive. It was elected at this time to terminate the procedure and plan for open gastrostomy tube placement tomorrow.  ____________________________ Earline MayotteJeffrey W. Allan Minotti, MD jwb:sb D: 07/01/2014 13:09:40 ET T: 07/01/2014 13:56:33 ET JOB#: [id number]  cc: Earline MayotteJeffrey W. Phi Avans, MD, <Dictator> Dow AdolphMatthew Rein, MD Adalin Vanderploeg Brion AlimentW Rhylen Shaheen MD ELECTRONICALLY SIGNED 07/04/2014 10:06

## 2014-11-19 NOTE — Consult Note (Signed)
Details:   - GI Note:  Low Profile MIC-KEY PEG to be done tomorrow at noon with Dr Lemar LivingsByrnett.    Rip Harbour- Ok to give lovenox this evening, hold am lovenox.  - npo after mn.   Electronic Signatures: Dow Adolphein, Danese Dorsainvil (MD)  (Signed 02-Dec-15 11:51)  Authored: Details   Last Updated: 02-Dec-15 11:51 by Dow Adolphein, Kymora Sciara (MD)
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
## 2014-11-19 NOTE — Op Note (Signed)
PATIENT NAME:  [name] [name], [name] [name] MR#:  [id number] DATE OF BIRTH:  28-May-1986  DATE OF PROCEDURE:  06/17/2014  PREOPERATIVE DIAGNOSES:  1. Pulmonary emboli.  2. History of deep vein thrombosis.   POSTOPERATIVE DIAGNOSES: 1. Pulmonary emboli.  2. History of deep vein thrombosis.  PROCEDURE PERFORMED: Inferior venacavogram.   SURGEON: Kassidie Hendriks g Matteson Blue, M.D.   SEDATION: Versed 1 mg plus fentanyl 50 mcg administered IV. Continuous ECG, pulse oximetry, and cardiopulmonary monitoring were performed throughout the entire procedure by the interventional radiology nurse. Total sedation time was 40 minutes.   ACCESS: A 5 French sheath, right and internal jugular vein.   CONTRAST USED: Isovue 55 mL.   FLUOROSCOPY TIME: 2.8 minutes.   INDICATIONS: [name] is a 29 year old gentleman with multiple medical problems who has had multiple DVTs as well as PEs in the past. Recently, he was noted to have significant desaturations and was brought to the Emergency Room. CT scan demonstrated new pulmonary findings consistent with acute pulmonary emboli when compared to his CT scan from just 2 months ago. Risks and benefits for venography to determine whether the clot came from the IVC and/or the filter is no longer adequate was recommended. Mother agreed for us to proceed.   DESCRIPTION OF PROCEDURE: The patient is taken to special procedures and placed in the supine position. After adequate sedation is achieved, his right neck is prepped and draped in a sterile fashion. Ultrasound is placed in a sterile sleeve. Jugular vein is identified. It is echolucent and compressible indicating patency. Image is recorded for the permanent record. Under real-time visualization Seldinger needle is inserted. J-wire is advanced, subsequently wire and long sheath are negotiated into the inferior vena cava and then past the filter into the right common iliac vein. Contrast is then injected demonstrating the iliac vein as well as some  reflux into the left common iliac vein and the inferior vena cava are widely patent. No evidence of thrombus noted. Catheter was then backed up and a magnified image at the level of the sheath is obtained. Again, the filter is free of thrombus. The sheath is then backed into the suprarenal vena cava, which is then evaluated demonstrating that it is of normal size, approximately 26 mm in diameter and, again, free of any evidence of thrombus. The sheath is then removed, pressure is held, and there are no immediate complications.   INTERPRETATION: Inferior vena cava demonstrated it is free of thrombus from its confluence with the iliac veins to the atrium. It is of normal caliber throughout. The filter is positioned in the infrarenal location in an upright well-oriented position and it is free of any evidence of thrombotic material within it or around it.    SUMMARY: No evidence of thrombus from within the inferior vena cava raising the likelihood that the pulmonary findings were secondary to his period off Xarelto.    ____________________________ Renford DillsGregory G. Jomar Denz, MD ggs:bm D: 06/17/2014 20:45:39 ET T: 06/18/2014 00:05:22 ET JOB#: [id number]  cc: Renford DillsGregory G. Rohail Klees, MD, <Dictator> Dr. Marella BileMiller Traxton Kolenda G Ruthie Berch MD ELECTRONICALLY SIGNED 06/28/2014 13:01

## 2014-11-19 NOTE — H&P (Signed)
PATIENT NAME:  [name] [name], HEBERLE MR#:  [id number] DATE OF BIRTH:  [birth year]  DATE OF ADMISSION:  05/10/2014  PRIMARY CARE PHYSICIAN:  Dr. Bethann Punches.    HISTORY OF PRESENT ILLNESS:  The patient is a 29 year old Caucasian male with history of traumatic brain injury who was hospitalized for urinary tract infection with Klebsiella pneumoniae and discharged on 04/27/2014 on oral antibiotics. He comes back today to the hospital with complaints of worsening mental status, screaming, being uncomfortable, also complains of burning sensation with urination which apparently never improved since his discharge from the hospital on 04/27/2014. According to the caregiver there were no recorded fevers. The patient has no cough or recent choking sensation, but was noted to have some wheezing. In the Emergency Room his O2 saturations were as low as 86% on room air, however during my evaluation it is 89% on room air.  He is not on any oxygen and not able to provide any history.   PAST MEDICAL HISTORY: Significant for history of admission for recurrent multiresistant Klebsiella urinary tract infection September 2015, discharged on 04/27/2014, history of traumatic brain injury, also history of quadriplegia for which the patient is being treated with baclofen pump placed in his left lower abdomen flank, history of DVT, PE, depression and anxiety not otherwise specified.   SOCIAL HISTORY: No alcohol, tobacco, or drug abuse. He used to be in skilled nursing facility, however now he is at home apparently being taking care by caregiver.   FAMILY HISTORY: Coronary artery disease.   ALLERGIES: AMBIEN, DILAUDID, ALSO ATIVAN.    MEDICATIONS: Baclofen 20 mg p.o. 3 times daily, Charlotte's Web Hemp Extract 20 drops 4 times daily, cranberry 400 mg once daily, docusate sodium 100 mg twice daily, Flomax 0.4 mg p.o. daily. Gabapentin 600 mg p.o. 3 times daily, indomethacin 75 mg p.o. once daily, melatonin 10 mg p.o. at bedtime, morphine  extended release 15 mg in 12 hours oral tablet twice daily, multivitamins once daily, oxycodone 5 mg p.o. twice daily, pantoprazole 40 mg p.o. twice daily, propanolol extended release 60 mg p.o. daily, risedronate 35 mg p.o. weekly on Saturday, sertraline 100 mg p.o. twice daily, temazepam 15 mg p.o. at bedtime, trazodone 50 mg p.o. at bedtime, and Xarelto 20 mg p.o. daily.    REVIEW OF SYSTEMS: Not available as the patient is very confused.    PHYSICAL EXAMINATION:   VITAL SIGNS:  On arrival to the hospital the patient's temperature was 98, pulse was 79, respiration was 18, blood pressure 126/59, saturation was 86% on room air.  GENERAL: This is a well-developed, well-nourished, mildly obese Caucasian male in moderate distress, diaphoretic, laying on the stretcher.  HEENT: His head is tilted to the right shoulder. His pupils are equal and reactive to light. Extraocular movements intact. No icterus or conjunctivitis.  Has normal hearing. No pharyngeal erythema. Oral mucosa is dry, some caked discharge around the mouth was noted.   NECK:  Supple, nontender. Thyroid is not enlarged. No adenopathy. No JVD or carotid bruits bilaterally. Full range of motion.  LUNGS: Clear to auscultation. A few rhonchi were heard and there are somewhat diminished breath sounds on the right side because of poor inspiratory effort and his arm being compressed to his chest. No significant wheezing was noted. The patient does have some limited inspiration, especially whenever he talks and increased effort whenever he talks, however he is not in acute respiratory distress.  CARDIOVASCULAR: S1, S2 appreciated. Rhythm is regular. PMI not lateralized. Chest is nontender to  palpation.  EXTREMITIES: 1 + pedal pulses. Trace lower extremity edema.  No clubbing or cyanosis were noted. The patient does have significant muscle tension and rigidity, has extensor tone increased in his lower extremities and flexor in his upper extremities. He  does have some contraction especially in his left hand.  ABDOMEN: Soft, nontender. Bowel sounds are present. No hepatosplenomegaly or masses were noted.  The patient does have well-healed scar in his left lower quadrant of abdomen which holds the baclofen pump, but no significant tenderness or discharge or any other abnormalities are noted around the baclofen pump placement in the skin.  RECTAL: Deferred.  MUSCLE STRENGTH: Not able to assess, he is not able to move much except the right upper extremity which he is able to swing. He has increased extensor tone in his lower extremities and flexor in the upper extremities. He has also contractures of both bilateral hands although more pronounced in the left upper extremity.   SKIN: Did not reveal any rashes, lesions, erythema, nodularity, or induration. It was warm and dry to palpation. LYMPHATIC: No adenopathy in the cervical region. NEUROLOGICAL: Cranial nerves grossly intact although the patient is very difficult to discuss with, the patient has dysarthria and Babinski bilaterally. He is alert, disoriented, and not cooperative, confused, and agitated during my interaction with him.   LABORATORY DATA: BMP today on 05/27/2014 and showed mild elevation of BUN of 24, glucose 114, otherwise BMP was unremarkable. The patient's albumin level was 3.3 and AST was elevated to 43, otherwise liver enzymes were normal. The patient's CBC, white blood cell count was normal at 8.8, hemoglobin was 12.2, platelet count 259,000, absolute neutrophil count is normal at 6.5. Urinalysis revealed hazy, yellow urine, negative for glucose, 1 + bilirubin, negative for ketones, specific gravity was 1.034, pH was 5.0, negative for blood, 30 mg/dL protein, negative for nitrites, 1 + leukocyte esterase, 5 red blood cells, and 36 white blood cells, no bacteria were seen, 1 epithelial cell, and mucus was present.   RADIOLOGIC STUDIES: Chest x-ray portable single view 05/10/2014, showed no  convincing pulmonary edema, streaky atelectasis or infiltrate in the lingula was noted.   ASSESSMENT AND PLAN:  1.  Complicated urinary tract infection.  Admit the patient to the medical floor. Get sputum cultures. Start the patient on cefoxitin IV and follow culture results. Also we will get ID involved for further recommendations.  2.  Altered mental status, likely metabolic encephalopathy due to infection well as hypoxia. We will follow with therapy. Mental status will be checked and neurologic checks will be checked while he is in the hospital.  3.  Mild dehydration clinically as well as laboratory-wise. We will continue the patient on IV fluids.  4.  Acute respiratory failure. We will get contrast enhanced CT scan of the chest. The patient's  chest x-ray however is concerning for possible recurrent aspiration pneumonia. We will continue on Xarelto for known history of PE and DVT.    TIME SPENT:  1 hour.     ____________________________ Katharina Caperima Mekiah Wahler, MD rv:bu D: 05/10/2014 20:51:51 ET T: 05/10/2014 21:19:28 ET JOB#: [id number]  cc: Katharina Caperima Rashad Auld, MD, <Dictator> Danella PentonMark F. Miller, MD Rusty Villella MD ELECTRONICALLY SIGNED 05/27/2014 11:42

## 2014-11-19 NOTE — Consult Note (Signed)
Intrathecal Pump Implant refilled today with Baclofen. No changes in rate or programming except to update new volume. No further need for our involvement at this point. Pump is to be refilled and followed by his Neurologist.  Electronic Signatures: Odette FractionNaveira, Thomasine Klutts Arturo (MD)  (Signed on 18-Nov-15 16:51)  Authored  Last Updated: [id number]: 18-Nov-15 16:51 by Odette FractionNaveira, Lilyonna Steidle Arturo (MD)

## 2014-11-19 NOTE — Discharge Summary (Signed)
PATIENT NAME:  [name] [name], Boruch MR#:  [id number] DATE OF BIRTH:  1985-12-25  DATE OF ADMISSION:  05/17/2014 DATE OF DISCHARGE:  05/18/2014  DISCHARGE DIAGNOSES:  1. Meropenem-induced seizures.  2. Quadriplegia.   3. Recurrent urinary tract infections.   4. History of deep venous thrombosis/pulmonary embolism.  5. Anxiety/depression.   DISCHARGE MEDICATIONS:  Pantoprazole 40 mg b.i.d., indomethacin 75 mg daily, morphine ER 15 mg b.i.d., baclofen 20 mg t.i.d., Xarelto 20 mg daily, propranolol ER 60 mg daily, gabapentin 600 mg t.i.d., docusate 100 mg b.i.d., melatonin 10 mg at bedtime, Flovent 0.4 mg daily, trazodone 50 mg at bedtime, multivitamin daily, temazepam 50 mg at bedtime, oxycodone 10 mg b.i.d., sertraline 100 mg b.i.d.   REASON FOR ADMISSION: A 29 year old male who presented with seizure activity. Please see H and P for HPI, past medical history, and physical exam.   HOSPITAL COURSE: The patient was admitted.  His seizure activity was within a few hours his IV meropenem dose and that was the most likely explanation. He has had no past history seizures, but certainly has a lower seizure threshold because of his medications and his closed head injury history. Brain CT showed no hemorrhage. His postictal state lasted a few hours and then he was back to baseline. In the future if he needs meropenem he will need to have a lower dose or choose a different agent. EEG is currently pending.    ____________________________ Danella PentonMark F. Tanasia Budzinski, MD mfm:bu D: 05/18/2014 07:40:07 ET T: 05/18/2014 13:37:10 ET JOB#: [id number]  cc: Danella PentonMark F. Ameenah Prosser, MD, <Dictator> Yachet Mattson Sherlene ShamsF Rocio Wolak MD ELECTRONICALLY SIGNED 05/19/2014 8:37

## 2014-11-19 NOTE — Discharge Summary (Signed)
PATIENT NAME:  [name] [name], [name] [name] MR#:  [id number] DATE OF BIRTH:  Dec 24, 1985  DATE OF ADMISSION:  05/10/2014 DATE OF DISCHARGE:  05/16/2014   DISCHARGE DIAGNOSES:   1.  Extended-spectrum beta-lactamases, Klebsiella, Proteus, Acinetobacter urinary tract infection.  2.  Encephalopathy, secondary to infection.  3.  Left lower lobe pneumonia, likely aspiration.  4.  Traumatic brain injury with quadriplegia.  5.  History of deep vein thrombosis/pulmonary embolus.  6.  Hyperostosis.   DISCHARGE MEDICATIONS: Baclofen 20 mg t.i.d., cranberry tablet 400 mg daily, Colace 100 mg b.i.d., Flomax 0.4 mg daily, gabapentin 600 mg t.i.d., indomethacin 75 mg daily, melatonin 10 mg daily, morphine ER 15 mg b.i.d., oxycodone 5 mg b.i.d., pantoprazole 40 mg daily, Protonix 40 mg b.i.d., propranolol ER 60 mg daily, risedronate 35 mg weekly, sertraline 100 mg b.i.d., temazepam 15 mg at bedtime, trazodone 50 mg at bedtime, Xarelto 20 mg daily.   REASON FOR ADMISSION: The patient is a 29 year old male who presents with ESBL urinary tract infection and pneumonia. Please see H and P for HPI, past medical history, and physical exam.   HOSPITAL COURSE: The patient was admitted, placed on meropenem and treated for almost a full week. His mental status came back to baseline. His cough improved. His laboratory counts were stable. Urology was consulted to look for an obstructive process; however, none was found. He had no significant post void residuals and no stones, as well as no renal stones to explain any urinary stasis problems. He does not appear to have a neurogenic bladder. He will be discharged to home. Overall prognosis is poor with his multiple medical problems.    ____________________________ Danella PentonMark F. Miller, MD mfm:MT D: 05/16/2014 08:16:13 ET T: 05/16/2014 08:32:04 ET JOB#: [id number]  cc: Danella PentonMark F. Miller, MD, <Dictator> Danella PentonMARK F MILLER MD ELECTRONICALLY SIGNED 05/17/2014 8:28

## 2014-11-19 NOTE — Consult Note (Signed)
PATIENT NAME:  [name] [name], [name] [name] MR#:  [id number] DATE OF BIRTH:  Jul 13, 1986  DATE OF CONSULTATION:  05/11/2014  REQUESTING PHYSICIAN: [name], MD (ID)  CONSULTING PHYSICIAN:  Claris Gladden, MD  PRIMARY CARE PHYSICIAN: [name], MD.  REASON FOR CONSULTATION: Recurrent urinary tract infections, ESBL Klebsiella.   HISTORY OF PRESENT ILLNESS: This is a 29 year old male with a history of traumatic brain injury who has recently been readmitted for treatment of an ESBL Klebsiella urinary tract infection. He failed management with oral antibiotics and he was readmitted for worsening mental status and severe pain with urination. No history of fevers or significant leukocytosis. He does have a history of kidney stones; however, it is unclear if he has ever required previous intervention for these (health care proxy unavailible at time of consult, multiple attempts to contact).   His bladder is managed with a condom catheter. The patient is unable to provide additional history; however, his CMA at the bedside reports that approximately 1 month ago, he did see a urologist (unsure name of the provider) who recommended initiation of clean intermittent catheterization 4 times a day. There were able to perform this without difficulty, initially with higher volumes up to 900 mL in the morning; however, as volumes decreased, they also decreased the frequency of catheterizations to 2 times a day and ultimately more recently stopped. Unknown history of previous urology workup including unknown if patient has ever previously had urodynamics or cystoscopy.   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  SOCIAL HISTORY: The patient lives at home with home care. His Healthcare Power of  Gerrit Friends is his sister Aundra Millet. Her telephone number is [phone]. Per records, no history of alcohol, tobacco or drug abuse.   FAMILY HISTORY:  Noncontributory.   ALLERGIES: AMBIEN, DILAUDID AND ATIVAN.  MEDICATIONS: Please see admission H and P.  REVIEW OF SYSTEMS: Unable to assess from patient given current mental status.  PHYSICAL EXAMINATION:  VITAL SIGNS: T-max or 99.4, T-current is 98.3, pulse 67, blood pressure 123/82, respirations 18, 91% on room air. Urine output not adequately assessed as he is voiding into a diaper. No post void residuals measured.  GENERAL: No acute distress, alert, but unable to effectively communicate. Lying in bed. No increased work of breathing. No respiratory distress.  NECK: Tracheostomy scar noted.  HEENT: Abnormal head posture.  ABDOMEN: Soft, nontender, nondistended. Baclofen pump and G tube scar noted.  GENITOURINARY: No CVA tenderness bilaterally. Bladder is nonpalpable and nondistended. Phallus is circumcised, orthotopic urethral meatus. No discharge or lesions. The scrotum: No swelling or scrotal lesions noted. No edema. Bilateral testicles palpable without masses or lesions. No testicular tenderness. Bilateral epididymal present without tenderness or significant enlargement.  RECTAL: Exam is deferred.  SKIN: No rashes noted. EXTREMITIES: Left lower extremity pitting edema noted.   LABORATORY DATA: WBC 6.7, hemoglobin 11.7, hematocrit 36.9, platelets 233,000.   Chemistry: Sodium 144, potassium 3.7, chloride 106, bicarbonate 33, creatinine 0.81, BUN 21, glucose 106.   LFTs within normal limits. Albumin 3.0.   UA from 05/10/2014 is yellow, hazy urine. Negative glucose, 1+ bilirubin, negative ketones. Specific gravity is 1.034, negative blood, pH 5.0, protein 30 mg/dL, negative protein, 1+ leukocyte esterase, 5 red blood cells per high-powered field, 36 white cells per high-powered field. No bacteria identified, 1 epithelial cell present, mucus  present.  Review of urine culture data reveals no growth from the urine collected on 10/13 x 2. A third sample collected via in-and-out catheterization is being held for possible pathogen. Previous culture data from 04/24/2014 shows Klebsiella pneumoniae greater than 100,000 highly resistant as well as Proteus mirabilis.   DIAGNOSTIC DATA: Previous imaging reviewed including A CT scan from January of 2015, which does show a 3 mm right proximal ureteral stone with right hydronephrosis. No other upper tract calculi noted. No bladder stones or GU anomalies.   Renal ultrasound from 04/05/2014 shows no evidence of hydronephrosis. Normal renal size. No parenchymal scarring or defects. No kidney stones.   ASSESSMENT AND PLAN: This is a 29 year old male with a history of traumatic brain injury and recurrent extended-spectrum beta-lactamases Klebsiella urinary tract infections. Review of previous upper tract imaging reveals no significant stone burden and no stones on ultrasound as recent as September of 2015. His bladder is currently managed with a condom catheter, but it was previously managed by clean intermittent catheterization. Assessment of bladder function with urodynamics is unknown.   1. Recommend checking post void residuals with each avoid to insure adequate bladder emptying. If residuals are elevated, would recommend reinitiation of clean intermittent catheterization and outpatient urodynamics to further assess bladder function.  2. Agree with intravenous antibiotics for extended-spectrum beta-lactamases Klebsiella. Deferred to infectious disease for this.  3. No evidence of upper tract anomalies or stones to serve as nidus for these infections.  4. Please call urology with questions or concerns as needed. We will be available to accommodate the patient and arrange for outpatient follow up as needed.  5. If symptoms fail to improve, consider prostate imaging to rule out abscess via CT pelvis or  transrectal ultrasound.  Thank you for allowing me to participate in the care of this patient.     ____________________________ Claris GladdenAshley J. Zafirah Vanzee, MD ajb:TT D: 05/11/2014 18:32:53 ET T: 05/11/2014 19:10:14 ET JOB#: [id number]  cc: Claris GladdenAshley J. Rhonda Vangieson, MD, <Dictator> Claris GladdenASHLEY J Janella Rogala MD ELECTRONICALLY SIGNED 05/12/2014 9:26

## 2014-11-19 NOTE — Consult Note (Signed)
Details:   - GI Note:  PEG is cancelled due per anesthesia due to medications being given with applesauce this monrning.   Will plan to perform PEG tomorrow at 1 pm.   NPO after midnight, no oral meds.   Electronic Signatures: Dow Adolphein, Kainat Pizana (MD)  (Signed 03-Dec-15 12:16)  Authored: Details   Last Updated: 03-Dec-15 12:16 by Dow Adolphein, Dimitrious Micciche (MD)

## 2014-11-19 NOTE — H&P (Signed)
PATIENT NAME:  [name] HelperRBY, [name] [name] DATE OF BIRTH:  08-23-1985  DATE OF ADMISSION:  04/24/2014  REFERRING PHYSICIAN: forbach  PRIMARY CARE PHYSICIAN: Bethann PunchesMark Miller[facility].   CHIEF COMPLAINT: Recurrent UTI.  HISTORY OF PRESENT ILLNESS: A 29 year old Caucasian gentleman with past medical history of traumatic brain injury with partial quadriplegia, history of recurrent DVT as well as PE who [facility] present to the hospital for treatment of UTI.  The patient is unable to provide any meaningful information given baseline mental status. History obtained from family who was sitting at bedside. Apparently, [facility] and has been on a course of Macrobid, Bactrim as well as Cipro. However, sensitivities returned that this was Klebsiella and he is still having active symptoms. Denies any fevers, chills. He uses a condom catheter at baseline.    REVIEW OF SYSTEMS: Unable to obtain given the patient's baseline mental status.   PAST MEDICAL HISTORY: Traumatic brain injury, quadriplegia, DVT, PE, depression and anxiety, not otherwise specified.   SOCIAL HISTORY: No alcohol, tobacco or drug use.   FAMILY HISTORY: Positive for coronary artery disease.   ALLERGIES: AMBIEN AS WELL AS DILAUDID.   HOME MEDICATIONS: Include Proscar 5 mg p.o. daily, indomethacin  75 mg p.o. daily, morphine 50 mg extended release b.i.d., oxycodone 5 mg p.o. q. 4 hours as needed for breakthrough pain, Flomax 0.4 mg p.o. daily, propranolol 10 mg p.o. b.i.d., Ativan 1 mg p.o. 3 times daily as needed for anxiety, Trileptal 600 mg p.o. b.i.d., sertraline 100 mg 2 tablets p.o. daily, trazodone 50 mg p.o. b.i.d., Sinemet 25/100 mg p.o. 4 times daily, trihexyphenidyl 5 mg 2 tablets p.o. daily, Seroquel 200 mg p.o. at bedtime, temazepam 50 mg p.o. at bedtime, Actonel 35 mg p.o. weekly, Lasix 20 mg p.o. daily, Colace 1 mg p.o. b.i.d., baclofen 20 mg  p.o. 3 times daily pantoprazole 40 mg p.o. b.i.d.    PHYSICAL EXAMINATION:  VITAL SIGNS: Temperature 98.1, heart rate 63, respirations 18, blood pressure 140/75, saturating 76% on room air. Weight 94.8 kilograms, BMI 28.4.  GENERAL: Well-nourished Caucasian gentleman, currently in no acute distress.  HEAD: Normocephalic, atraumatic.  EYES: Pupils equal, reactive to light. Extraocular muscles intact. No scleral icterus. MOUTH: Dry mucosal membranes. Dentition intact. No abscess noted.  EARS, NOSE, AND THROAT: Clear without exudates. No external lesions. NECK: Supple. No thyromegaly or nodules. No JVD. PULMONARY: Clear to auscultation bilaterally without wheezes, rales or rhonchi. No use of accessory muscles. Good respiratory effort. CHEST: Nontender to palpation.  CARDIOVASCULAR: S1, S2. Regular rate and rhythm. No murmurs, rubs, or gallops. No edema. Pedal pulses 2+ bilaterally.  GASTROINTESTINAL: Soft, nontender, nondistended. No masses. Positive bowel sounds. No hepatosplenomegaly.  MUSCULOSKELETAL: No swelling, clubbing or edema. Lower and upper extremities reveal signs of atrophy. Upper extremity with soft contractures, but he still has some range of motion in upper extremities. NEUROLOGIC: Cranial nerves II-XII intact, however, remainder of neurological examination somewhat difficult as the patient is uncooperative with examination and he is fixated on the immobility of his legs which is chronic. SKIN: Does have some distal ulceration at the tip of the right great toe, however, there are no further lesions, rashes or cyanosis. Skin warm and dry, turgor intact. PSYCHIATRIC: Mood and affect flat. He is awake, alert, oriented to person, however, he is having difficulty answering questions, mainly secondary to fixation of his leg immobility. Insight and judgment are  noted to be poor at this time.  LABORATORY DATA: Sodium 141, potassium 4.3, chloride 107, bicarbonate 31, BUN 24, creatinine 0.79,  glucose 82. WBC 8.4, hemoglobin 12.9, platelets 207,000. Urinalysis: WBC 3274, RBCs 44, leukocyte esterase 3+, epithelials at 1. He had a urinalysis performed on 09/17 which cultured positive for Klebsiella; however, sensitivities are pending at this time.   ASSESSMENT AND PLAN: A 29 year old Caucasian gentleman with history of traumatic brain injury, quadriplegia, recurrent deep venous thrombosis, pulmonary embolism who presented with failure of outpatient treatment for urinary tract infection. 1.  Urinary tract infection, failed outpatient treatment. It appears to be Klebsiella based off culture data; however, no sensitivity given at this time from [facility] system. Question if this is possible extended spectrum beta-lactamase. We will dose with meropenem and follow culture data.  2.  Deep venous thrombosis/pulmonary embolism. Continue his Xarelto. 3.  Depression. Continue Zoloft. 4.  Chronic pain. Continue morphine at his dose.  5.  Venous thromboembolism prophylaxis. On Xarelto.  CODE STATUS: The patient a full code.  TIME SPENT: Forty-five minutes.   ____________________________ Cletis Athens. Hersey Maclellan, MD dkh:TT D: 04/24/2014 21:01:10 ET T: 04/24/2014 21:16:35 ET JOB#: [id number]  cc: Cletis Athens. Viriginia Amendola, MD, <Dictator> Artelia Game Synetta Shadow MD ELECTRONICALLY SIGNED 04/24/2014 23:39

## 2014-11-19 NOTE — Discharge Summary (Signed)
ADDENDUM  PATIENT NAME:  [name] [name], [name] [name] MR#:  [id number] DATE OF BIRTH:  [birth year]  DATE OF ADMISSION:  07/28/2013 DATE OF DISCHARGE:  08/02/2013  On discharge medications, knock off  Pradaxa and put Lovenox 60 mg subcutaneous b.i.d. After talking with the patient's family, they request a more aggressive approach with subcutaneous Lovenox in the place of Pradaxa, which I think is reasonable and thus will pursue that course off Pradaxa, on Lovenox.  ____________________________ [name] PentonMark F. Dayanna Pryce, MD mfm:aw D: 08/02/2013 10:26:45 ET T: 08/02/2013 11:06:30 ET JOB#: [id number]  cc: [name] PentonMark F. Chealsey Miyamoto, MD, <Dictator> [name] PentonMARK F Kenijah Benningfield MD ELECTRONICALLY SIGNED 08/02/2013 17:32
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
## 2014-11-19 NOTE — Discharge Summary (Signed)
PATIENT NAME:  [name] [name], Jacier MR#:  [id number] DATE OF BIRTH:  04-28-1986  DATE OF ADMISSION:  06/13/2014 DATE OF DISCHARGE:  06/22/2014  DISCHARGE DIAGNOSES:  1.  Bilateral pulmonary emboli.  2.  Right brachial deep vein thrombosis.  3.  Central sleep apnea.  4.  History meropenem-induced seizures.  5.  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  6.  Recurrent urinary tract infections with extended-spectrum beta-lactamase Klebsiella/Proteus.  7.  History of aspiration pneumonia.   DISCHARGE MEDICATIONS: Protonix 40 mg b.i.d.,  indomethacin 75 mg daily, morphine ER 15 mg b.i.d., baclofen 20 mg t.i.d., gabapentin 600 mg t.i.d., docusate b.i.d., Flomax 0.4 mg daily, oxycodone 10 mg q.i.d., propranolol LA 80 mg daily, alendronate 35 mg weekly, Depakote 250 mg b.i.d., Risperdal 1 mg b.i.d., doxepin 10 mg at bedtime, albuterol 2 puffs q.4, Eliquis 10 mg b.i.d.   REASON FOR ADMISSION: This is a 29 year old male who presents with respiratory failure. Please see H and P for HPI, past medical history and physical exam.   HOSPITAL COURSE: The patient was admitted and found to have bilateral pulmonary emboli by CT. He underwent evaluation of his IVC filter which apparently was in place with good function. He subsequently had a right brachial DVT thought related to a PICC line but possibly there prior. The PICC was pulled. With signs of recurrent PEs even in the face of low-dose Eliquis, he was placed on IV heparin for 5 days. In light of his failure on 20 mg Xarelto, he will be placed per hematology recommendation on Eliquis 10 mg b.i.d. with [facility]. He certainly will need factor X levels checked. Due to his progressive apnea with his traumatic brain injury, it is clear that he was failing CPAP and BiPAP, and, to reduce readmissions and adequately treat his hypoxia, a Trilogy machine was incorporated for bedtime treatment for his apnea. Overall prognosis is guarded.     ____________________________ Danella PentonMark F. Shadiyah Wernli, MD mfm:JT D: 06/22/2014 07:59:17 ET T: 06/22/2014 10:41:27 ET JOB#: [id number]  cc: Danella PentonMark F. Dontel Harshberger, MD, <Dictator> Sumayah Bearse Sherlene ShamsF Terra Aveni MD ELECTRONICALLY SIGNED 06/24/2014 9:33

## 2014-11-19 NOTE — Discharge Summary (Signed)
PATIENT NAME:  [name] [name], [name] [name] MR#:  [id number] DATE OF BIRTH:  Aug 06, 1985  DATE OF ADMISSION:  06/27/2014 DATE OF DISCHARGE:  07/04/2014   DISCHARGE DIAGNOSES:  1.  Acute encephalopathy due to over medication.  2.  Malnutrition with dysphagia.  3.  Recurrent multidrug resistant Klebsiella urinary tract infection.  4.  Traumatic brain injury with functional quadriplegia.  5.  Deep vein thrombosis/pulmonary embolus, post inferior vena cava filter placement, on chronic Eliquis.  6.  Depression/anxiety.  7.  History of seizure disorder.   DISCHARGE MEDICATIONS: Pantoprazole 40 mg b.i.d., morphine ER 15 mg b.i.d., docusate 100 mg b.i.d., Flomax 0.4 mg daily, risedronate 35 mg weekly, albuterol HFA 2 puffs every 4 hours p.r.n., Eliquis 10 mg b.i.d., gabapentin 300 mg t.i.d., Risperdal 1 mg t.i.d., Keppra 500 mg b.i.d., Baclofen 20 mg t.i.d., free water 30 mL q. 4 hours to keep tube open.    REASON FOR ADMISSION: A 29 year old male who presents with obtundation. Please see H and P for HPI, past medical history, and physical exam.   HOSPITAL COURSE: The patient was admitted. His medications were held, as he was unable to swallow. His mentation came essentially back to baseline, but he really has not been swallowing well since his last seizure. He can tolerate an aspiration-type diet, but because of insufficient p.o. intake, a PEG tube was placed that required surgical intervention in light of scar tissue. The tube is well functioning and will be used for backup for medications and feeding. Overall prognosis is poor with his multiple medical problems.   His medications were discussed with [facility], and the oxycodone discontinued, Depakote discontinued, and the Neurontin dropped to 300 mg 3 times a day. Keppra was added in the place of Depakote for seizure control.    ____________________________ Danella PentonMark F. Lyn Deemer, MD mfm:MT D: 07/04/2014 07:29:01 ET T: 07/04/2014 08:12:52  ET JOB#: [id number]  cc: Danella PentonMark F. Tilley Faeth, MD, <Dictator> Darnise Montag Sherlene ShamsF Kadarious Dikes MD ELECTRONICALLY SIGNED 07/04/2014 22:44

## 2014-11-19 NOTE — Consult Note (Signed)
PATIENT NAME:  [name] [name], [name] [name] MR#:  [id number] DATE OF BIRTH:  07/15/1986  DATE OF CONSULTATION:  05/11/2014  REFERRING PHYSICIAN:  [name], MD CONSULTING PHYSICIAN:  Stann Mainland. Sampson Goon, MD  REASON FOR CONSULTATION:  ESBL Escherichia coli and recurrent urinary tract infections.   HISTORY OF PRESENT ILLNESS:  This is a 29 year old gentleman with history of prior traumatic brain injury as well as the patient is seen with his mother and grandparents as well as his home health aide in the room today. History is difficult to obtain. Apparently, he has had recurrent urinary tract infections. His mother does not know if he has ever had any urological procedures or assessment for this. She says that in the past they have done straight catheterization on him, but she cannot give any further details. He is currently using a condom catheter. He was admitted October 13 with increasing confusion and painful urination. Apparently, when he gets ill, he develops worsening mental status and irritability. He also has a history of hypoxia when he was seen in the ED. He has a long history of dysphagia and requires honey-thick liquids. He has been seen by speech and swallow in the past. On admission, he had a CT scan, which showed what appeared to be probable recurrent aspiration pneumonitis. He recently was discharged on oral Macrobid for a urinary tract infection with an ESBL producing Klebsiella pneumoniae and a Proteus infection. He was treated with dose of carbapenem; however, it was difficult to have a PICC line placed in him, and he was discharged on oral nitrofurantoin.   PAST MEDICAL HISTORY: 1.  Traumatic brain injury.  2.  Recurrent urinary infection.  3.  Prior nephrolithiasis.  4.  Quadriplegia managed with a baclofen pump.  5.  Prior DVT and PE.  6.  Depression and anxiety.   SOCIAL HISTORY:  He lives with family member and has a caregiver. He does not smoke, drink, or use drugs.   FAMILY HISTORY:   Noncontributory.   ALLERGIES:  AMBIEN, DILAUDID, AND ATIVAN.   ANTIBIOTICS SINCE ADMISSION:  Include meropenem.   REVIEW OF SYSTEMS:  Unable to be obtained.   PHYSICAL EXAMINATION: VITAL SIGNS:  Temperature 98.3, pulse 67, blood pressure 122/82, respirations 18, saturation 91% on room air.  GENERAL:  He is largely bed bound. His neck is turned to the right. He gets very agitated and starts cursing when you get near him at times, but his nurse's aide is able to calm him down.  HEENT:  Pupils are reactive. Oropharynx is clear.  NECK:  Turned to the side, slight torticollis.  HEART:  Regular.  LUNGS:  Clear.  ABDOMEN:  Obese, soft, nontender.  EXTREMITIES:  No clubbing, cyanosis, or edema.   LABORATORY DATA:  Microbiology cultures of the urine on September 27 grew greater than 100,000 Klebsiella pneumoniae and Proteus mirabilis. The Klebsiella pneumoniae was an ESBL positive and sensitive to imipenem and nitrofurantoin. The Proteus was a more sensitive organism, but was resistant to nitrofurantoin. It was sensitive to Keflex, ampicillin, ceftriaxone, gentamicin, imipenem, Bactrim, cefoxitin, and ertapenem. Blood cultures on October 13 were no growth to date. Urine culture on October 13 is being held for possible pathogen. Urinalysis done on September 27 showed 3274 white cells. Repeat urinalysis showed only 36 white cells on October 13. White blood count on admission was 8.8; during his last admission, it was only 8.4. Hemoglobin currently is 11.7, platelets 233. Renal function is normal with creatinine of 0.81. LFTs are normal  except albumin low at 3.0.   IMAGING:  Chest x-ray from October 13 showed streaky atelectasis in the lingula. CT of the chest on October 13 showed negative for PE. 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 There is a micronodular pattern. There is a background of patchy lung attenuation.   IMPRESSION:  A  29 year old gentleman with traumatic brain injury and quadriplegia, who has a condom catheter for urinary incontinence and an unclear history of possible neurogenic bladder. He also apparently has recurrent aspiration risk. He was admitted with altered mental status. He recently had an extended-spectrum beta-lactamase with Klebsiella pneumoniae and Proteus mirabilis urine infection with several-thousand white cells on his urinalysis. He initially was treated with intravenous ertapenem, but then refused a peripherally inserted central catheter line and was discharged on nitrofurantoin. He is readmitted. He does not have an impressive amount of pyuria at this time, his white blood count is normal, and he has no fevers. He does also appear to have chronic recurrent aspiration pneumonitis.   I suspect his infection in his urine is relatively well treated at this point, but cultures are pending. At this point, I would recommend continuing meropenem. He can be discharged hopefully on an oral alternative or potentially on ertapenem 1 gram daily intramuscularly. He would be a risk to put a peripherally inserted central catheter line in given his agitated state.   Consult urology for evaluation of the need for intermittent straight catheterization. He likely has some urinary retention and would probably benefit from better bladder emptying to prevent stagnant urination and recurrent infections.   Continue aspiration precautions.   Thank you for the consult. I will be glad to follow with you.    ____________________________ Stann Mainlandavid P. Sampson GoonFitzgerald, MD dpf:nb D: 05/11/2014 23:03:40 ET T: 05/11/2014 23:52:30 ET JOB#: [id number]  cc: Stann Mainlandavid P. Sampson GoonFitzgerald, MD, <Dictator> DAVID Sampson GoonFITZGERALD MD ELECTRONICALLY SIGNED 05/24/2014 9:54
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
## 2014-11-27 NOTE — Op Note (Signed)
PATIENT NAME:  [name] [name], Martel MR#:  [id number] DATE OF BIRTH:  February 23, 1986  DATE OF PROCEDURE:  09/17/2014  PREOPERATIVE DIAGNOSIS: Dysphagia.   POSTOPERATIVE DIAGNOSIS:  Dysphagia.   PROCEDURE PERFORMED: Insertion of a new gastrostomy tube.   Wearing gown and gloves as a barrier against Acinetobacter, the gastrostomy site was examined and some gastric contents which has leaked out were removed with gauze dressings. The old gastrostomy tube was removed. A new 18-gauge gastrostomy tube was inserted and the balloon was inflated with 15 mL of water. Traction was applied to pull the balloon up to the abdominal wall. There were some additional gastric contents, which were removed with gauze. Subsequently, the wound appeared clean and gauze dressings were applied. The disk was advanced down to pull the balloon up snuggly adjacent to the abdominal wall and tape was used to attach the gauze to the skin, and also the tape was used to help keep the disk in place.   The patient tolerated this satisfactorily.      ____________________________ Shela CommonsJ. Renda RollsWilton Da Authement, MD jws:at D: 09/17/2014 19:51:00 ET T: 09/17/2014 20:10:18 ET JOB#: [id number]  cc: Adella HareJ. Wilton Yesenia Locurto, MD, <Dictator> Adella HareWILTON J Keeton Kassebaum MD ELECTRONICALLY SIGNED 09/21/2014 18:11
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
## 2015-01-29 ENCOUNTER — Emergency Department: Payer: BLUE CROSS/BLUE SHIELD

## 2015-01-29 ENCOUNTER — Inpatient Hospital Stay: Payer: BLUE CROSS/BLUE SHIELD

## 2015-01-29 ENCOUNTER — Encounter: Payer: Self-pay | Admitting: Emergency Medicine

## 2015-01-29 ENCOUNTER — Inpatient Hospital Stay
Admission: EM | Admit: 2015-01-29 | Discharge: 2015-02-14 | DRG: 177 | Disposition: A | Discharge status: Home / Self Care | Payer: BLUE CROSS/BLUE SHIELD | Attending: Internal Medicine | Admitting: Internal Medicine

## 2015-01-29 DIAGNOSIS — G9341 Metabolic encephalopathy: Secondary | ICD-10-CM | POA: Diagnosis not present

## 2015-01-29 DIAGNOSIS — Z8701 Personal history of pneumonia (recurrent): Secondary | ICD-10-CM

## 2015-01-29 DIAGNOSIS — M419 Scoliosis, unspecified: Secondary | ICD-10-CM | POA: Diagnosis present

## 2015-01-29 DIAGNOSIS — Z888 Allergy status to other drugs, medicaments and biological substances status: Secondary | ICD-10-CM | POA: Diagnosis not present

## 2015-01-29 DIAGNOSIS — Z87820 Personal history of traumatic brain injury: Secondary | ICD-10-CM

## 2015-01-29 DIAGNOSIS — Z8249 Family history of ischemic heart disease and other diseases of the circulatory system: Secondary | ICD-10-CM | POA: Diagnosis not present

## 2015-01-29 DIAGNOSIS — Z86711 Personal history of pulmonary embolism: Secondary | ICD-10-CM | POA: Diagnosis not present

## 2015-01-29 DIAGNOSIS — Z7902 Long term (current) use of antithrombotics/antiplatelets: Secondary | ICD-10-CM | POA: Diagnosis not present

## 2015-01-29 DIAGNOSIS — K219 Gastro-esophageal reflux disease without esophagitis: Secondary | ICD-10-CM | POA: Diagnosis not present

## 2015-01-29 DIAGNOSIS — I959 Hypotension, unspecified: Secondary | ICD-10-CM | POA: Diagnosis present

## 2015-01-29 DIAGNOSIS — Z931 Gastrostomy status: Secondary | ICD-10-CM | POA: Diagnosis not present

## 2015-01-29 DIAGNOSIS — F419 Anxiety disorder, unspecified: Secondary | ICD-10-CM | POA: Diagnosis not present

## 2015-01-29 DIAGNOSIS — Z7951 Long term (current) use of inhaled steroids: Secondary | ICD-10-CM

## 2015-01-29 DIAGNOSIS — R532 Functional quadriplegia: Secondary | ICD-10-CM | POA: Diagnosis not present

## 2015-01-29 DIAGNOSIS — Z431 Encounter for attention to gastrostomy: Secondary | ICD-10-CM

## 2015-01-29 DIAGNOSIS — R443 Hallucinations, unspecified: Secondary | ICD-10-CM | POA: Diagnosis not present

## 2015-01-29 DIAGNOSIS — Z885 Allergy status to narcotic agent status: Secondary | ICD-10-CM | POA: Diagnosis not present

## 2015-01-29 DIAGNOSIS — J690 Pneumonitis due to inhalation of food and vomit: Secondary | ICD-10-CM | POA: Diagnosis present

## 2015-01-29 DIAGNOSIS — R569 Unspecified convulsions: Secondary | ICD-10-CM

## 2015-01-29 DIAGNOSIS — Z87891 Personal history of nicotine dependence: Secondary | ICD-10-CM

## 2015-01-29 DIAGNOSIS — Y95 Nosocomial condition: Secondary | ICD-10-CM | POA: Diagnosis present

## 2015-01-29 DIAGNOSIS — G40909 Epilepsy, unspecified, not intractable, without status epilepticus: Secondary | ICD-10-CM | POA: Diagnosis present

## 2015-01-29 DIAGNOSIS — I10 Essential (primary) hypertension: Secondary | ICD-10-CM | POA: Diagnosis present

## 2015-01-29 DIAGNOSIS — Z7401 Bed confinement status: Secondary | ICD-10-CM | POA: Diagnosis not present

## 2015-01-29 DIAGNOSIS — F329 Major depressive disorder, single episode, unspecified: Secondary | ICD-10-CM | POA: Diagnosis present

## 2015-01-29 DIAGNOSIS — Z79899 Other long term (current) drug therapy: Secondary | ICD-10-CM | POA: Diagnosis not present

## 2015-01-29 DIAGNOSIS — J9600 Acute respiratory failure, unspecified whether with hypoxia or hypercapnia: Secondary | ICD-10-CM | POA: Diagnosis present

## 2015-01-29 DIAGNOSIS — R5383 Other fatigue: Secondary | ICD-10-CM

## 2015-01-29 DIAGNOSIS — E440 Moderate protein-calorie malnutrition: Secondary | ICD-10-CM | POA: Diagnosis present

## 2015-01-29 DIAGNOSIS — Z9889 Other specified postprocedural states: Secondary | ICD-10-CM

## 2015-01-29 DIAGNOSIS — J189 Pneumonia, unspecified organism: Secondary | ICD-10-CM

## 2015-01-29 DIAGNOSIS — G8250 Quadriplegia, unspecified: Secondary | ICD-10-CM | POA: Diagnosis present

## 2015-01-29 LAB — CBC WITH DIFFERENTIAL/PLATELET
BASOS ABS: 0 10*3/uL (ref 0–0.1)
BASOS PCT: 0 %
EOS PCT: 1 %
Eosinophils Absolute: 0.1 10*3/uL (ref 0–0.7)
HEMATOCRIT: 38.4 % — AB (ref 40.0–52.0)
Hemoglobin: 12.3 g/dL — ABNORMAL LOW (ref 13.0–18.0)
Lymphocytes Relative: 16 %
Lymphs Abs: 1.4 10*3/uL (ref 1.0–3.6)
MCH: 26.4 pg (ref 26.0–34.0)
MCHC: 32.1 g/dL (ref 32.0–36.0)
MCV: 82.3 fL (ref 80.0–100.0)
MONO ABS: 0.7 10*3/uL (ref 0.2–1.0)
MONOS PCT: 8 %
NEUTROS ABS: 6.3 10*3/uL (ref 1.4–6.5)
Neutrophils Relative %: 75 %
Platelets: 260 10*3/uL (ref 150–440)
RBC: 4.67 MIL/uL (ref 4.40–5.90)
RDW: 16.9 % — AB (ref 11.5–14.5)
WBC: 8.5 10*3/uL (ref 3.8–10.6)

## 2015-01-29 LAB — BLOOD GAS, ARTERIAL
Acid-Base Excess: 4.7 mmol/L — ABNORMAL HIGH (ref 0.0–3.0)
Allens test (pass/fail): POSITIVE — AB
Bicarbonate: 30.9 mEq/L — ABNORMAL HIGH (ref 21.0–28.0)
DELIVERY SYSTEMS: POSITIVE
Drawn by: 187461
Expiratory PAP: 6
FIO2: 0.5 %
Inspiratory PAP: 12
O2 SAT: 86 %
PATIENT TEMPERATURE: 37
PCO2 ART: 51 mmHg — AB (ref 32.0–48.0)
PH ART: 7.39 (ref 7.350–7.450)
RATE: 10 resp/min
pO2, Arterial: 52 mmHg — ABNORMAL LOW (ref 83.0–108.0)

## 2015-01-29 LAB — COMPREHENSIVE METABOLIC PANEL
ALK PHOS: 69 U/L (ref 38–126)
ALT: 14 U/L — ABNORMAL LOW (ref 17–63)
AST: 15 U/L (ref 15–41)
Albumin: 3.5 g/dL (ref 3.5–5.0)
Anion gap: 6 (ref 5–15)
BUN: 17 mg/dL (ref 6–20)
CO2: 31 mmol/L (ref 22–32)
Calcium: 8.9 mg/dL (ref 8.9–10.3)
Chloride: 108 mmol/L (ref 101–111)
Creatinine, Ser: 0.68 mg/dL (ref 0.61–1.24)
GFR calc Af Amer: >60 mL/min (ref >60)
GFR calc non Af Amer: >60 mL/min (ref >60)
GLUCOSE: 92 mg/dL (ref 65–99)
POTASSIUM: 3.7 mmol/L (ref 3.5–5.1)
Sodium: 145 mmol/L (ref 135–145)
TOTAL PROTEIN: 6.9 g/dL (ref 6.5–8.1)
Total Bilirubin: 0.4 mg/dL (ref 0.3–1.2)

## 2015-01-29 LAB — GLUCOSE, CAPILLARY: Glucose-Capillary: 76 mg/dL (ref 65–99)

## 2015-01-29 LAB — MRSA PCR SCREENING: MRSA by PCR: POSITIVE — AB

## 2015-01-29 LAB — TROPONIN I: Troponin I: <0.03 ng/mL (ref <0.031)

## 2015-01-29 MED ORDER — ACETAMINOPHEN 325 MG PO TABS: 650.0000 mg | ORAL_TABLET | Freq: Four times a day (QID) | ORAL | Status: DC | PRN

## 2015-01-29 MED ORDER — INDOMETHACIN ER 75 MG PO CPCR
75.0000 mg | ORAL_CAPSULE | Freq: Every day | ORAL | Status: DC
  Administered 2015-01-31 – 2015-02-05 (×6): 75 mg via ORAL
  Filled 2015-01-29 (×11): qty 1

## 2015-01-29 MED ORDER — SODIUM CHLORIDE 0.9 % IV SOLN
INTRAVENOUS | Status: DC
  Administered 2015-01-29 – 2015-01-31 (×4): via INTRAVENOUS

## 2015-01-29 MED ORDER — LACOSAMIDE 200 MG PO TABS
200.0000 mg | ORAL_TABLET | Freq: Two times a day (BID) | ORAL | Status: DC
  Administered 2015-01-29 – 2015-02-14 (×28): 200 mg via ORAL
  Filled 2015-01-29 (×31): qty 1

## 2015-01-29 MED ORDER — VANCOMYCIN HCL IN DEXTROSE 1-5 GM/200ML-% IV SOLN
1000.0000 mg | INTRAVENOUS | Status: AC
  Administered 2015-01-29: 1000 mg via INTRAVENOUS

## 2015-01-29 MED ORDER — CHLORHEXIDINE GLUCONATE 0.12 % MT SOLN
15.0000 mL | Freq: Two times a day (BID) | OROMUCOSAL | Status: DC
  Administered 2015-01-30 – 2015-02-14 (×22): 15 mL via OROMUCOSAL
  Filled 2015-01-29: qty 15

## 2015-01-29 MED ORDER — ONDANSETRON HCL 4 MG/2ML IJ SOLN
4.0000 mg | Freq: Four times a day (QID) | INTRAMUSCULAR | Status: DC | PRN
  Filled 2015-01-29: qty 2

## 2015-01-29 MED ORDER — VANCOMYCIN HCL IN DEXTROSE 1-5 GM/200ML-% IV SOLN
INTRAVENOUS | Status: AC
  Administered 2015-01-29: 1000 mg via INTRAVENOUS
  Filled 2015-01-29: qty 200

## 2015-01-29 MED ORDER — BACLOFEN 10 MG PO TABS
20.0000 mg | ORAL_TABLET | Freq: Three times a day (TID) | ORAL | Status: DC
  Administered 2015-01-29 – 2015-02-14 (×40): 20 mg via ORAL
  Filled 2015-01-29 (×7): qty 2
  Filled 2015-01-29: qty 1
  Filled 2015-01-29 (×10): qty 2
  Filled 2015-01-29: qty 1
  Filled 2015-01-29 (×21): qty 2
  Filled 2015-01-29 (×3): qty 1
  Filled 2015-01-29 (×3): qty 2
  Filled 2015-01-29: qty 1
  Filled 2015-01-29: qty 2

## 2015-01-29 MED ORDER — SODIUM CHLORIDE 0.9 % IV SOLN
INTRAVENOUS | Status: AC
  Administered 2015-01-29: 3 g via INTRAVENOUS
  Filled 2015-01-29: qty 3

## 2015-01-29 MED ORDER — PANTOPRAZOLE SODIUM 40 MG PO TBEC: 40.0000 mg | DELAYED_RELEASE_TABLET | Freq: Two times a day (BID) | ORAL | Status: DC

## 2015-01-29 MED ORDER — PROPRANOLOL HCL ER 60 MG PO CP24
60.0000 mg | ORAL_CAPSULE | Freq: Every day | ORAL | Status: DC
  Filled 2015-01-29 (×2): qty 1

## 2015-01-29 MED ORDER — CRANBERRY 1000 MG PO CAPS: 1000.0000 mg | ORAL_CAPSULE | Freq: Every day | ORAL | Status: DC

## 2015-01-29 MED ORDER — FREE WATER
200.0000 mL | Freq: Three times a day (TID) | Status: DC
  Administered 2015-01-29 – 2015-01-31 (×5): 200 mL

## 2015-01-29 MED ORDER — ALBUTEROL SULFATE (2.5 MG/3ML) 0.083% IN NEBU
3.0000 mL | INHALATION_SOLUTION | RESPIRATORY_TRACT | Status: DC | PRN
  Administered 2015-02-06: 3 mL via RESPIRATORY_TRACT
  Filled 2015-01-29: qty 3

## 2015-01-29 MED ORDER — NORTRIPTYLINE HCL 25 MG PO CAPS
100.0000 mg | ORAL_CAPSULE | Freq: Every day | ORAL | Status: DC
Start: 2015-01-29 — End: 2015-02-06
  Administered 2015-01-29 – 2015-02-04 (×7): 100 mg via ORAL
  Filled 2015-01-29 (×7): qty 4

## 2015-01-29 MED ORDER — ACETAMINOPHEN 650 MG RE SUPP: 650.0000 mg | Freq: Four times a day (QID) | RECTAL | Status: DC | PRN

## 2015-01-29 MED ORDER — ALBUTEROL SULFATE (2.5 MG/3ML) 0.083% IN NEBU
5.0000 mg | INHALATION_SOLUTION | Freq: Once | RESPIRATORY_TRACT | Status: AC
  Administered 2015-01-29: 5 mg via RESPIRATORY_TRACT

## 2015-01-29 MED ORDER — SODIUM CHLORIDE 0.9 % IV SOLN
1250.0000 mg | Freq: Three times a day (TID) | INTRAVENOUS | Status: DC
  Administered 2015-01-29 – 2015-01-30 (×4): 1250 mg via INTRAVENOUS
  Filled 2015-01-29 (×7): qty 1250

## 2015-01-29 MED ORDER — IOHEXOL 350 MG/ML SOLN
100.0000 mL | Freq: Once | INTRAVENOUS | Status: AC | PRN
  Administered 2015-01-29: 100 mL via INTRAVENOUS

## 2015-01-29 MED ORDER — SODIUM CHLORIDE 0.9 % IV SOLN
3.0000 g | Freq: Three times a day (TID) | INTRAVENOUS | Status: DC
  Administered 2015-01-29 – 2015-02-01 (×9): 3 g via INTRAVENOUS
  Filled 2015-01-29 (×9): qty 3

## 2015-01-29 MED ORDER — RISEDRONATE SODIUM 5 MG PO TABS: 35.0000 mg | ORAL_TABLET | ORAL | Status: DC

## 2015-01-29 MED ORDER — PANTOPRAZOLE SODIUM 40 MG IV SOLR
40.0000 mg | Freq: Two times a day (BID) | INTRAVENOUS | Status: DC
  Administered 2015-01-29 – 2015-02-04 (×12): 40 mg via INTRAVENOUS
  Filled 2015-01-29 (×12): qty 40

## 2015-01-29 MED ORDER — ADULT MULTIVITAMIN W/MINERALS CH
1.0000 | ORAL_TABLET | Freq: Every day | ORAL | Status: DC
  Administered 2015-01-30 – 2015-02-14 (×13): 1 via ORAL
  Filled 2015-01-29 (×17): qty 1

## 2015-01-29 MED ORDER — APIXABAN 5 MG PO TABS
5.0000 mg | ORAL_TABLET | Freq: Two times a day (BID) | ORAL | Status: DC
Start: 2015-01-29 — End: 2015-02-14
  Administered 2015-01-29 – 2015-02-14 (×28): 5 mg via ORAL
  Filled 2015-01-29 (×31): qty 1

## 2015-01-29 MED ORDER — PREGABALIN 75 MG PO CAPS
75.0000 mg | ORAL_CAPSULE | Freq: Three times a day (TID) | ORAL | Status: DC
  Administered 2015-01-29 – 2015-02-04 (×18): 75 mg via ORAL
  Filled 2015-01-29 (×20): qty 1

## 2015-01-29 MED ORDER — RISPERIDONE 0.5 MG PO TABS
1.0000 mg | ORAL_TABLET | Freq: Two times a day (BID) | ORAL | Status: DC
  Administered 2015-01-29 – 2015-02-04 (×12): 1 mg via ORAL
  Filled 2015-01-29 (×12): qty 2

## 2015-01-29 MED ORDER — ALBUTEROL SULFATE (2.5 MG/3ML) 0.083% IN NEBU
INHALATION_SOLUTION | RESPIRATORY_TRACT | Status: AC
  Administered 2015-01-29: 5 mg via RESPIRATORY_TRACT
  Filled 2015-01-29: qty 3

## 2015-01-29 MED ORDER — CETYLPYRIDINIUM CHLORIDE 0.05 % MT LIQD
7.0000 mL | Freq: Two times a day (BID) | OROMUCOSAL | Status: DC
  Administered 2015-01-30 – 2015-02-13 (×20): 7 mL via OROMUCOSAL

## 2015-01-29 MED ORDER — PIPERACILLIN-TAZOBACTAM 4.5 G IVPB
4.5000 g | Freq: Once | INTRAVENOUS | Status: AC
  Administered 2015-01-29: 4.5 g via INTRAVENOUS
  Filled 2015-01-29: qty 100

## 2015-01-29 MED ORDER — OLANZAPINE 10 MG PO TABS
15.0000 mg | ORAL_TABLET | Freq: Every day | ORAL | Status: DC
  Administered 2015-01-29 – 2015-02-13 (×15): 15 mg via ORAL
  Filled 2015-01-29: qty 2
  Filled 2015-01-29: qty 1
  Filled 2015-01-29 (×6): qty 2
  Filled 2015-01-29: qty 1
  Filled 2015-01-29 (×4): qty 2
  Filled 2015-01-29: qty 1
  Filled 2015-01-29 (×2): qty 2
  Filled 2015-01-29: qty 1
  Filled 2015-01-29: qty 2

## 2015-01-29 MED ORDER — DOCUSATE SODIUM 50 MG/5ML PO LIQD: 100.0000 mg | Freq: Two times a day (BID) | ORAL | Status: DC

## 2015-01-29 MED ORDER — PROPRANOLOL HCL 20 MG/5ML PO SOLN
20.0000 mg | Freq: Three times a day (TID) | ORAL | Status: DC
  Administered 2015-01-29 – 2015-02-06 (×23): 20 mg
  Filled 2015-01-29 (×28): qty 5

## 2015-01-29 MED ORDER — DOCUSATE SODIUM 100 MG PO CAPS
100.0000 mg | ORAL_CAPSULE | Freq: Two times a day (BID) | ORAL | Status: DC
  Filled 2015-01-29: qty 1

## 2015-01-29 MED ORDER — GABAPENTIN 300 MG PO CAPS
600.0000 mg | ORAL_CAPSULE | Freq: Three times a day (TID) | ORAL | Status: DC
  Administered 2015-01-29 – 2015-02-05 (×21): 600 mg via ORAL
  Filled 2015-01-29 (×23): qty 2

## 2015-01-29 MED ORDER — ONDANSETRON HCL 4 MG PO TABS: 4.0000 mg | ORAL_TABLET | Freq: Four times a day (QID) | ORAL | Status: DC | PRN

## 2015-01-29 MED ORDER — DOCUSATE SODIUM 100 MG PO CAPS
100.0000 mg | ORAL_CAPSULE | Freq: Two times a day (BID) | ORAL | Status: DC
  Administered 2015-01-29 – 2015-02-14 (×25): 100 mg via ORAL
  Filled 2015-01-29 (×29): qty 1

## 2015-01-29 NOTE — Progress Notes (Signed)
Spoke with Dr Dema SeverinMungal regarding patient's respiratory status. Plan is to trial off bipap and reapply later for bedtime use. Vitals stable on bipap. Weaned off to 3 liter nasal cannula. Saturation noted at 97% would not tolerate venturi mask. Patient is very verbal per his baseline. Requesting something to drink. Verified with RN will reapply therapy as needed and at bedtime. Will titrate epap to 8 or 10 if o2 requirements increase while on bipap per Dr Dema SeverinMungal. It o2 saturation is good while on current settings 12/6 50%, will wean fio2 as tolerated. BBS are diminished and clear. No wheezing or rhonci noted.
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
## 2015-01-29 NOTE — ED Notes (Signed)
Family at bedside. 

## 2015-01-29 NOTE — Progress Notes (Signed)
During nursing assessment of PEG assessed and he has a large adhesive foam dressing surrounding tube. When removed the tube dislodged. I reinserted.I spoke with patient's mother regarding PEG and she reports the tube came out about a week ago and they inserted a foley but did not take him to the ER or call MD. She said the tube should be in place as it goes directly in his stomach. I told her since the tube dislodged it is best practice to obtain a x-ray to verify placement prior to use.   I called Dr. Allena KatzPatel and informed him and requested KUB. Order received for KUB.

## 2015-01-29 NOTE — ED Notes (Addendum)
Pt arrived from home via EMS after low oxygen saturation of 80% at room air. Pt lives at home with 24 hour care and wears CPAP at night.  Would not tolerate CPAP with EMS.  Upon arrival oxygen on room air was 85% immediately after taking non rebreather off. Per family patient has not had any urine output since 10pm last night and does have a foley catheter. PEG tube to left upper quadrant.
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
## 2015-01-29 NOTE — ED Provider Notes (Addendum)
[facility] Emergency Department Provider Note  ____________________________________________  Time seen: Seen upon arrival  I have reviewed the triage vital signs and the nursing notes.   HISTORY  Chief Complaint Respiratory Distress    HPI [name] [name] is a 29 y.o. male with a history of traumatic brain injury who presents today with hypoxia from home. EMS is that the patient was 80% on room air and placed him on a nonrebreather. They tried to place him on CPAP but kept ripping the mask off his face. At baseline the patient is able to speak but unclear of what his a slide level of cognition is. Has paralysis of the left upper extremity as well as the bilateral lower extremities.     Past Medical History  Diagnosis Date  . Hepatitis C   . Scoliosis   . 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 Subarachnoid hemorrhage   . Pulmonary emboli   . Hepatitis C   . Depression     Patient Active Problem List   Diagnosis Date Noted  . 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 Skin irritation 11/09/2014  . TBI (traumatic brain injury) 02/04/2012  . 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 Fracture surgery      left acetabulum  . Ileostomy    . Lung tube    . Ankle surgery Bilateral   . Tracheostomy    . Peg tube placement      Current Outpatient Rx  Name  Route  Sig  Dispense  Refill  . acetaminophen (TYLENOL) 500 MG tablet   Oral   Take 1,000 mg by mouth every 6 (six) hours as needed.         . APIXABAN PO   Oral   Take by mouth.         . baclofen (LIORESAL) 20 MG tablet   Oral   Take 20 mg by mouth 3 (three) times daily.         . cetirizine (ZYRTEC) 10 MG tablet   Oral   Take 10 mg by mouth daily.         . chlorhexidine (PERIDEX) 0.12 % solution   Mouth/Throat   Use as directed 15 mLs in the mouth or throat 2 (two) times daily.         .  Cranberry 1000 MG CAPS   Oral   Take by mouth.         . docusate sodium (COLACE) 100 MG capsule   Oral   Take 100 mg by mouth daily.         Marland Kitchen gabapentin (NEURONTIN) 300 MG capsule   Oral   Take 900 mg by mouth 4 (four) times daily.          Marland Kitchen ibuprofen (ADVIL,MOTRIN) 200 MG tablet   Oral   Take 600-800 mg by mouth every 6 (six) hours as needed. For pain         . ipratropium (ATROVENT HFA) 17 MCG/ACT inhaler   Inhalation   Inhale 2 puffs into the lungs every 6 (six) hours.         Marland Kitchen lacosamide (VIMPAT) 200 MG TABS tablet   Oral   Take 200 mg by mouth 2 (two) times daily.         Marland Kitchen levETIRAcetam (KEPPRA) 500 MG tablet   Oral   Take 500 mg by mouth 2 (two) times daily.         Marland Kitchen  loratadine (CLARITIN) 10 MG tablet   Oral   Take 10 mg by mouth daily.         . Multiple Vitamins-Minerals (MULTIVITAMIN WITH MINERALS) tablet   Oral   Take 1 tablet by mouth daily.         Marland Kitchen EXPIRED: nortriptyline (PAMELOR) 50 MG capsule   Oral   Take 2 capsules (100 mg total) by mouth at bedtime.   60 capsule   3   . ondansetron (ZOFRAN) 4 MG tablet   Oral   Take 4 mg by mouth every 8 (eight) hours as needed for nausea or vomiting.         . pantoprazole (PROTONIX) 40 MG tablet   Oral   Take 40 mg by mouth daily.         . polyethylene glycol (MIRALAX / GLYCOLAX) packet   Oral   Take 17 g by mouth daily.         Marland Kitchen EXPIRED: pregabalin (LYRICA) 75 MG capsule   Oral   Take 1 capsule (75 mg total) by mouth 3 (three) times daily. Take one at night for 4 days, then one twice a day for 4 days then one three x per day thereafter.   90 capsule   4   . risperiDONE (RISPERDAL) 1 MG tablet   Oral   Take 1 mg by mouth 3 (three) times daily.         . tamsulosin (FLOMAX) 0.4 MG CAPS capsule   Oral   Take 0.4 mg by mouth.         . traMADol (ULTRAM) 50 MG tablet   Oral   Take 50-100 mg by mouth 3 (three) times daily as needed. For pain. Maximum dose= 8  tablets per day          . traZODone (DESYREL) 50 MG tablet   Oral   Take 150 mg by mouth at bedtime.           Allergies Ambien; Ativan; Depakote er; Dilaudid; and Keppra  History reviewed. No pertinent family history.  Social History History  Substance Use Topics  . Smoking status: Former Smoker    Quit date: 08/07/2011  . Smokeless tobacco: Never Used  . Alcohol Use: No    Review of Systems Caveat due to patient's traumatic brain injury.    ____________________________________________   PHYSICAL EXAM:  VITAL SIGNS: Vital signs reviewed. ED Triage Vitals  Enc Vitals Group     BP --      Pulse --      Resp --      Temp --      Temp src --      SpO2 01/29/15 1224 96 %     Weight --      Height --      Head Cir --      Peak Flow --      Pain Score --      Pain Loc --      Pain Edu? --      Excl. in GC? --     Constitutional: Alert and not in any acute distress. Eyes: Conjunctivae are normal. Head: Atraumatic. Nose: No congestion/rhinnorhea. Mouth/Throat: Mucous membranes are moist.  Wearing nonrebreather.  Neck: No stridor.   Cardiovascular: Normal rate, regular rhythm. Grossly normal heart sounds.  Good peripheral circulation. Respiratory: Normal respiratory effort.  No retractions. Lungs CTAB. Gastrointestinal: Soft and nontender. No distention. Gastric tube in place with the surrounding of  the stoma being CDI. Musculoskeletal: No lower extremity tenderness nor edema.  No joint effusions. Neurologic:  Left upper extremity as well as bilateral lower extremities paralysis.  Skin:  Skin is warm, dry and intact. No rash noted.  Patient quickly desatted to 85% on room air.  ____________________________________________   LABS (all labs ordered are listed, but only abnormal results are displayed)  Labs Reviewed  CULTURE, BLOOD (ROUTINE X 2)  CULTURE, BLOOD (ROUTINE X 2)  CBC WITH DIFFERENTIAL/PLATELET  COMPREHENSIVE METABOLIC PANEL  TROPONIN I    ____________________________________________  EKG  ED ECG REPORT I, Arelia LongestSchaevitz,  Zienna Ahlin M, the attending physician, personally viewed and interpreted this ECG.   Date: 01/29/2015  EKG Time: 1245  Rate: 81  Rhythm: normal sinus rhythm  Axis: normal axis  Intervals:none  ST&T Change: no abnormal t wave inversion. No st elevations or depressions.    ____________________________________________  RADIOLOGY  Progressive left middle and lower lung disease consistent with pneumonia. I personally reviewed these images. ____________________________________________   PROCEDURES CRITICAL CARE Performed by: Arelia LongestSchaevitz,  Jlee Harkless M   Total critical care time: 40 minutes  Critical care time was exclusive of separately billable procedures and treating other patients.  Critical care was necessary to treat or prevent imminent or life-threatening deterioration.  Critical care was time spent personally by me on the following activities: development of treatment plan with patient and/or surrogate as well as nursing, discussions with consultants, evaluation of patient's response to treatment, examination of patient, obtaining history from patient or surrogate, ordering and performing treatments and interventions, ordering and review of laboratory studies, ordering and review of radiographic studies, pulse oximetry and re-evaluation of patient's condition.  Started patient on BiPAP.  Bedside ultrasound guided line insertion to the right basilic vein. Long 18-gauge catheter used with good blood return of maroon blood and flushing easily. ____________________________________________   INITIAL IMPRESSION / ASSESSMENT AND PLAN / ED COURSE  Pertinent labs & imaging results that were available during my care of the patient were reviewed by me and considered in my medical decision making (see chart for details).  ----------------------------------------- 1:25 PM on  01/29/2015 -----------------------------------------  Patient tolerating the BiPAP well. Oxygen saturations at 95%. Will admit to the hospital for hospital-acquired pneumonia. I note that Dr. Allena KatzPatel. Labs still pending. Patient was a difficult blood stick. ____________________________________________   FINAL CLINICAL IMPRESSION(S) / ED DIAGNOSES  Acute pneumonia with hypoxia. Initial visit.    Myrna Blazeravid Matthew Bayyinah Dukeman, MD 01/29/15 1327  Decided to treat for age Because of multiple comorbidities. Has multiple past hospital admissions.    Myrna Blazeravid Matthew Manila Rommel, MD 01/29/15 [phone]

## 2015-01-29 NOTE — ED Notes (Signed)
Bipap applied

## 2015-01-29 NOTE — Progress Notes (Signed)
Lab called regarding patient's MRSA nasal swab is positive. Patient placed on isolation.

## 2015-01-29 NOTE — Progress Notes (Signed)
bipap on standby. Patients at patient's bedside. Patient responding well at this time. Vitals good. Saturation 97% on nasal o2. Mom states patient just had meds so to wait about an hour to put on bipap.

## 2015-01-30 DIAGNOSIS — F419 Anxiety disorder, unspecified: Secondary | ICD-10-CM | POA: Diagnosis present

## 2015-01-30 DIAGNOSIS — F329 Major depressive disorder, single episode, unspecified: Secondary | ICD-10-CM | POA: Diagnosis present

## 2015-01-30 DIAGNOSIS — G40A19 Absence epileptic syndrome, intractable, without status epilepticus: Secondary | ICD-10-CM | POA: Diagnosis not present

## 2015-01-30 DIAGNOSIS — Z7902 Long term (current) use of antithrombotics/antiplatelets: Secondary | ICD-10-CM | POA: Diagnosis not present

## 2015-01-30 DIAGNOSIS — Z87891 Personal history of nicotine dependence: Secondary | ICD-10-CM | POA: Diagnosis not present

## 2015-01-30 DIAGNOSIS — R400 Somnolence: Secondary | ICD-10-CM | POA: Diagnosis not present

## 2015-01-30 DIAGNOSIS — J690 Pneumonitis due to inhalation of food and vomit: Secondary | ICD-10-CM | POA: Diagnosis not present

## 2015-01-30 DIAGNOSIS — Z7951 Long term (current) use of inhaled steroids: Secondary | ICD-10-CM | POA: Diagnosis not present

## 2015-01-30 DIAGNOSIS — Z87820 Personal history of traumatic brain injury: Secondary | ICD-10-CM | POA: Diagnosis not present

## 2015-01-30 DIAGNOSIS — Z885 Allergy status to narcotic agent status: Secondary | ICD-10-CM | POA: Diagnosis not present

## 2015-01-30 DIAGNOSIS — I959 Hypotension, unspecified: Secondary | ICD-10-CM

## 2015-01-30 DIAGNOSIS — J189 Pneumonia, unspecified organism: Secondary | ICD-10-CM | POA: Diagnosis not present

## 2015-01-30 DIAGNOSIS — J9601 Acute respiratory failure with hypoxia: Secondary | ICD-10-CM

## 2015-01-30 DIAGNOSIS — G9341 Metabolic encephalopathy: Secondary | ICD-10-CM | POA: Diagnosis not present

## 2015-01-30 DIAGNOSIS — J9600 Acute respiratory failure, unspecified whether with hypoxia or hypercapnia: Secondary | ICD-10-CM | POA: Diagnosis present

## 2015-01-30 DIAGNOSIS — M419 Scoliosis, unspecified: Secondary | ICD-10-CM | POA: Diagnosis present

## 2015-01-30 DIAGNOSIS — G40909 Epilepsy, unspecified, not intractable, without status epilepticus: Secondary | ICD-10-CM | POA: Diagnosis present

## 2015-01-30 DIAGNOSIS — Z7401 Bed confinement status: Secondary | ICD-10-CM | POA: Diagnosis not present

## 2015-01-30 DIAGNOSIS — Z9889 Other specified postprocedural states: Secondary | ICD-10-CM | POA: Diagnosis not present

## 2015-01-30 DIAGNOSIS — Y95 Nosocomial condition: Secondary | ICD-10-CM | POA: Diagnosis present

## 2015-01-30 DIAGNOSIS — Z8249 Family history of ischemic heart disease and other diseases of the circulatory system: Secondary | ICD-10-CM | POA: Diagnosis not present

## 2015-01-30 DIAGNOSIS — R0600 Dyspnea, unspecified: Secondary | ICD-10-CM | POA: Diagnosis not present

## 2015-01-30 DIAGNOSIS — R443 Hallucinations, unspecified: Secondary | ICD-10-CM | POA: Diagnosis present

## 2015-01-30 DIAGNOSIS — R561 Post traumatic seizures: Secondary | ICD-10-CM | POA: Diagnosis not present

## 2015-01-30 DIAGNOSIS — K219 Gastro-esophageal reflux disease without esophagitis: Secondary | ICD-10-CM | POA: Diagnosis present

## 2015-01-30 DIAGNOSIS — Z79899 Other long term (current) drug therapy: Secondary | ICD-10-CM | POA: Diagnosis not present

## 2015-01-30 DIAGNOSIS — Z86711 Personal history of pulmonary embolism: Secondary | ICD-10-CM | POA: Diagnosis not present

## 2015-01-30 DIAGNOSIS — Z888 Allergy status to other drugs, medicaments and biological substances status: Secondary | ICD-10-CM | POA: Diagnosis not present

## 2015-01-30 DIAGNOSIS — R532 Functional quadriplegia: Secondary | ICD-10-CM | POA: Diagnosis present

## 2015-01-30 DIAGNOSIS — Z8701 Personal history of pneumonia (recurrent): Secondary | ICD-10-CM | POA: Diagnosis not present

## 2015-01-30 DIAGNOSIS — E440 Moderate protein-calorie malnutrition: Secondary | ICD-10-CM | POA: Diagnosis present

## 2015-01-30 DIAGNOSIS — Z931 Gastrostomy status: Secondary | ICD-10-CM | POA: Diagnosis not present

## 2015-01-30 DIAGNOSIS — I10 Essential (primary) hypertension: Secondary | ICD-10-CM | POA: Diagnosis present

## 2015-01-30 LAB — BASIC METABOLIC PANEL
Anion gap: 6 (ref 5–15)
BUN: 19 mg/dL (ref 6–20)
CALCIUM: 7.7 mg/dL — AB (ref 8.9–10.3)
CHLORIDE: 112 mmol/L — AB (ref 101–111)
CO2: 26 mmol/L (ref 22–32)
CREATININE: 0.51 mg/dL — AB (ref 0.61–1.24)
GFR calc Af Amer: >60 mL/min (ref >60)
GFR calc non Af Amer: >60 mL/min (ref >60)
Glucose, Bld: 75 mg/dL (ref 65–99)
Potassium: 3.8 mmol/L (ref 3.5–5.1)
Sodium: 144 mmol/L (ref 135–145)

## 2015-01-30 LAB — CBC
HEMATOCRIT: 32.2 % — AB (ref 40.0–52.0)
HEMOGLOBIN: 10.3 g/dL — AB (ref 13.0–18.0)
MCH: 26.3 pg (ref 26.0–34.0)
MCHC: 32.1 g/dL (ref 32.0–36.0)
MCV: 81.7 fL (ref 80.0–100.0)
Platelets: 212 10*3/uL (ref 150–440)
RBC: 3.94 MIL/uL — AB (ref 4.40–5.90)
RDW: 16.8 % — ABNORMAL HIGH (ref 11.5–14.5)
WBC: 8 10*3/uL (ref 3.8–10.6)

## 2015-01-30 LAB — GLUCOSE, CAPILLARY: Glucose-Capillary: 68 mg/dL (ref 65–99)

## 2015-01-30 MED ORDER — JEVITY 1.2 CAL PO LIQD
1000.0000 mL | ORAL | Status: DC
  Administered 2015-01-30: 1000 mL
  Filled 2015-01-30: qty 1000

## 2015-01-30 MED ORDER — SODIUM CHLORIDE 0.9 % IV BOLUS (SEPSIS)
500.0000 mL | Freq: Once | INTRAVENOUS | Status: AC
  Administered 2015-01-30: 500 mL via INTRAVENOUS

## 2015-01-30 MED ORDER — JEVITY 1.2 CAL PO LIQD
1000.0000 mL | ORAL | Status: DC
  Filled 2015-01-30: qty 1000

## 2015-01-30 MED ORDER — JEVITY 1.2 CAL PO LIQD: 1000.0000 mL | ORAL | Status: DC

## 2015-01-30 NOTE — Progress Notes (Signed)
Spoke to Dr. Sampson GoonFitzgerald- Stated that patient hadn't voided since 1 am- I bladder scanned patient and pt had 290ml of urine in bladder.  Pt receiving NS at 1500ml/hr.  Can pt have speech evaluation-Pt has PEG tube-.  MD ordered speech evaluation and to just monitor urine output.

## 2015-01-30 NOTE — Progress Notes (Signed)
Initial Nutrition Assessment     INTERVENTION:   EN: noted order placed for Jevity 1.2; while NPO recommend rate of 70 ml/hr providing 2016 kcals, 94 g of protein. Will make further recommendations post Barium Swallow with SLP.   NUTRITION DIAGNOSIS:  Inadequate oral intake related to inability to eat as evidenced by NPO status.  GOAL:  Patient will meet greater than or equal to 90% of their needs  EN: tolerance  MONITOR:   (Energy Intake, EN, Digestive System, Electrolyte/Renal Profile, Glucose Profile)  REASON FOR ASSESSMENT:  Consult Assessment of nutrition requirement/status  ASSESSMENT:  Pt admitted with acute respiratory failure likely due to aspiration pneumonia  PMHx:  Past Medical History  Diagnosis Date  . Hepatitis C   . Scoliosis   . 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 Subarachnoid hemorrhage   . Pulmonary emboli   . Hepatitis C   . Depression     Diet Order: NPO  Current Nutrition: NPO, pt stating he is hungry  Food/Nutrition-Related History: Per RN, mom stating pt eating po diet at home and supplementing with 1 can of TwoCal HN when he doesn't eat a good meal (up to 3 cans per day   Medications: NS at 100 ml/hr  Electrolyte/Renal Profile and Glucose Profile:   Recent Labs Lab 01/29/15 1326 01/30/15 0630  NA 145 144  K 3.7 3.8  CL 108 112*  CO2 31 26  BUN 17 19  CREATININE 0.68 0.51*  CALCIUM 8.9 7.7*  GLUCOSE 92 75   Protein Profile:  Recent Labs Lab 01/29/15 1326  ALBUMIN 3.5    Gastrointestinal Profile: PEG tube in place, per RN tube has falledn out at home and inhospital and replaced, abdomnial xray with tip of tube in pylorus, noted surgical consult pending Last BM: 7/2   Nutrition-Focused Physical Exam Findings: unable to assess   Anthropometrics: Height:  Ht Readings from Last 1 Encounters:  01/29/15 6' (1.829 m)    Weight:  Wt Readings from Last 1 Encounters:  01/30/15 202 lb 6.1 oz (91.8 kg)   Noted  weight trend  Wt Readings from Last 10 Encounters:  01/30/15 202 lb 6.1 oz (91.8 kg)  11/09/14 190 lb (86.183 kg)  02/04/12 165 lb (74.844 kg)  05/23/11 168 lb (76.204 kg)    BMI:  Body mass index is 27.44 kg/(m^2).  Estimated Nutritional Needs:  Kcal:   1853-2317 (BEE 1931, 1.2 AF, 0.8-1.0IF)  Protein:   92-110 g (1.0-1.2 g/kg)   Fluid:   2300-2850 mL (25-30 ml/kg)   Skin:  Reviewed, no issues  Diet Order:  Diet NPO time specified     Intake/Output Summary (Last 24 hours) at 01/30/15 1556 Last data filed at 01/30/15 1500  Gross per 24 hour  Intake 3611.67 ml  Output   1800 ml  Net 1811.67 ml    HIGH Care Level  Romelle Starcherate Catlyn Shipton MS, RD, LDN [phone] Pager

## 2015-01-30 NOTE — Progress Notes (Signed)
[facility]  PROGRESS NOTE Date of Admission:  01/29/2015     ID: [name] [name] is a 29 y.o. male with TBI, recurrent aspiration events admitted with resp distress after new caregiver fed him.  Active Problems:   Acute respiratory failure  Subjective: Resp status improving, seen by pulm - now on RA.  ROS  Eleven systems are reviewed and negative except per hpi  Medications:  Antibiotics Given (last 72 hours)    Date/Time Action Medication Dose Rate   01/29/15 1330 Given  [given in ER]   piperacillin-tazobactam (ZOSYN) IVPB 4.5 g 4.5 g 200 mL/hr   01/29/15 1936 Given   vancomycin (VANCOCIN) 1,250 mg in sodium chloride 0.9 % 250 mL IVPB 1,250 mg 166.7 mL/hr   01/30/15 0400 Given   vancomycin (VANCOCIN) 1,250 mg in sodium chloride 0.9 % 250 mL IVPB 1,250 mg 166.7 mL/hr   01/30/15 1208 Given   vancomycin (VANCOCIN) 1,250 mg in sodium chloride 0.9 % 250 mL IVPB 1,250 mg 166.7 mL/hr     . ampicillin-sulbactam (UNASYN) IV  3 g Intravenous Q8H  . antiseptic oral rinse  7 mL Mouth Rinse q12n4p  . apixaban  5 mg Oral BID  . baclofen  20 mg Oral TID  . chlorhexidine  15 mL Mouth Rinse BID  . docusate sodium  100 mg Oral BID  . free water  200 mL Per Tube 3 times per day  . gabapentin  600 mg Oral TID  . indomethacin  75 mg Oral Daily  . lacosamide  200 mg Oral BID  . multivitamin with minerals  1 tablet Oral Daily  . nortriptyline  100 mg Oral QHS  . OLANZapine  15 mg Oral QHS  . pantoprazole (PROTONIX) IV  40 mg Intravenous Q12H  . pregabalin  75 mg Oral TID  . propranolol  20 mg Per Tube TID  . risperiDONE  1 mg Oral BID  . vancomycin  1,250 mg Intravenous Q8H    Objective: Vital signs in last 24 hours: Temp:  [97.8 F (36.6 C)-98.3 F (36.8 C)] 98.3 F (36.8 C) (07/04 1200) Pulse Rate:  [69-95] 69 (07/04 1000) Resp:  [9-21] 11 (07/04 1000) BP: (82-128)/(47-91) 98/69 mmHg (07/04 1000) SpO2:  [93 %-100 %] 100 % (07/04 1000) FiO2 (%):  [30 %] 30 % (07/04 0500) Weight:   [91.8 kg (202 lb 6.1 oz)-92.1 kg (203 lb 0.7 oz)] 91.8 kg (202 lb 6.1 oz) (07/04 0500)  GENERAL: 29 y.o.-year-old patient chroniocallyu ill-appearing EYES: Pupils equal, round, reactive to light and accommodation. No scleral icterus. Extraocular muscles intact.  HEENT: Head atraumatic, normocephalic. Oropharynx and nasopharynx clear.  NECK: Supple, no jugular venous distention. No thyroid enlargement, no tenderness.  LUNGS: Left lung with rhonchi,  CARDIOVASCULAR: S1, S2 normal. .  ABDOMEN: Soft, nontender, nondistended. Bowel sounds present. No organomegaly or mass. PEG tube in place - wnl.  EXTREMITIES: No pedal edema, cyanosis, or clubbing.  NEUROLOGIC: Limited due to patient not able to follow commands  PSYCHIATRIC: The patient is alert and oriented x 3.  SKIN: No obvious rash, lesion, or ulcer.   Lab Results  Recent Labs  01/29/15 1326 01/30/15 0630  WBC 8.5 8.0  HGB 12.3* 10.3*  HCT 38.4* 32.2*  NA 145 144  K 3.7 3.8  CL 108 112*  CO2 31 26  BUN 17 19  CREATININE 0.68 0.51*    Microbiology: Results for orders placed or performed during the hospital encounter of 01/29/15  Blood culture (routine x  2)     Status: None (Preliminary result)   Collection Time: 01/29/15  1:10 PM  Result Value Ref Range Status   Specimen Description BLOOD  Final   Special Requests NONE  Final   Culture NO GROWTH < 24 HOURS  Final   Report Status PENDING  Incomplete  Blood culture (routine x 2)     Status: None (Preliminary result)   Collection Time: 01/29/15  1:26 PM  Result Value Ref Range Status   Specimen Description BLOOD  Final   Special Requests NONE  Final   Culture NO GROWTH < 24 HOURS  Final   Report Status PENDING  Incomplete  MRSA PCR Screening     Status: Abnormal   Collection Time: 01/29/15  4:02 PM  Result Value Ref Range Status   MRSA by PCR POSITIVE (A) NEGATIVE Final    Comment:        The GeneXpert MRSA Assay (FDA approved for NASAL specimens only), is one  component of a comprehensive MRSA colonization surveillance program. It is not intended to diagnose MRSA infection nor to guide or monitor treatment for MRSA infections. CRITICAL RESULT CALLED TO, READ BACK BY AND VERIFIED WITH: CHERYL SMITH ON 01/29/15 AT 1750 BY JEF     Studies/Results: Dg Chest 1 View  01/29/2015   CLINICAL DATA:  Hypoxic  EXAM: CHEST  1 VIEW  COMPARISON:  09/18/2014  FINDINGS: Stable thoracic levoscoliosis. Patchy airspace opacities in the mid and lower left lung, increased from previous exam. Right lung clear. Relatively low lung volumes. Heart size upper limits normal for technique. No pneumothorax. No effusion.  IMPRESSION: 1. Progressive left mid and lower lung airspace opacities suggesting pneumonia.   Electronically Signed   By: Corlis Leak  Hassell M.D.   On: 01/29/2015 13:02   Dg Abd 1 View  01/29/2015   CLINICAL DATA:  Bedside gastrostomy tube replacement. Confirmation of tube positioning.  EXAM: Portable ABDOMEN - 1 VIEW  COMPARISON:  Visualized upper abdomen on CTA chest yesterday.  FINDINGS: 20 ml CC Omnipaque 300 were administered via the gastrostomy tube. The tip of the tube is in the distal portion of the stomach at the pylorus. Normal-appearing duodenum and jejunum is opacified. No evidence of contrast extravasation. Visualized bowel gas pattern unremarkable.  IMPRESSION: Appropriate positioning of the gastrostomy tube with its tip at the gastric pylorus.   Electronically Signed   By: Hulan Saashomas  Lawrence M.D.   On: 01/29/2015 17:46   Ct Angio Chest Pe W/cm &/or Wo Cm  01/29/2015   CLINICAL DATA:  Low oxygen saturation, where CPAP at night, hypoxemia, no urine output since last night, history attic traumatic brain injury, spastic to complete GI, hepatitis-C, former smoker  EXAM: CT ANGIOGRAPHY CHEST WITH CONTRAST  TECHNIQUE: Multidetector CT imaging of the chest was performed using the standard protocol during bolus administration of intravenous contrast. Multiplanar CT image  reconstructions and MIPs were obtained to evaluate the vascular anatomy.  CONTRAST:  100mL OMNIPAQUE IOHEXOL 350 MG/ML SOLN IV  COMPARISON:  09/17/2014  FINDINGS: Gastrostomy tube at gastric fundus.  Remaining visualized portion of upper abdomen normal appearance.  No thoracic adenopathy.  Aorta normal caliber without aneurysm or dissection.  Pulmonary arteries adequately opacified and patent.  No evidence of pulmonary embolism.  Central peribronchial thickening.  Patchy airspace infiltrates identified in LEFT upper and LEFT lower lobes consistent with pneumonia.  Dependent atelectasis in RIGHT lower lobe.  No pleural effusion or pneumothorax.  Chronic height loss of a mid to lower  thoracic vertebra unchanged.  Review of the MIP images confirms the above findings.  IMPRESSION: No evidence of pulmonary embolism.  Patchy infiltrates in LEFT upper and LEFT lower lobes consistent with pneumonia.  Dependent atelectasis RIGHT lower lobe.   Electronically Signed   By: Ulyses Southward M.D.   On: 01/29/2015 15:47    Assessment/Plan: Patient is a 29 year old bedbound male with history of traumatic brain injury presents with acute respiratory failure 1. Acute respiratory failure due to left lower lobe pneumonia likely as result of aspiration pneumonia.  Continue  Unasyn - vancomycin (MRSA screen +) Weaned off BiPAP therapy,  - hold feeds until SS does barium swallow in AM PEG tube  - mother asks re changing to ballon peg - consult surg- had been seen by Dr Lemar Livings as otpt but put in by Dr Katrinka Blazing   History of pulmonary embolism; continue Eliquis as taking at home    Traumatic brain injury; continue baclofen, and gabapentin, Lyrica, nortriptyline,   GERD continue Protonix  Samar Dass   01/30/2015, 1:23 PM

## 2015-01-30 NOTE — Progress Notes (Signed)
bipap placed on patient at 2330. Patient has now broken mask by pulling it off. Mask replaced and attempted to put back on patient however he pulls at tubing trying to take it off. Gives me the thumbs down indicator with his hand. Mask not applied at this time. Reported to rn. Patient in no distress
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
## 2015-01-30 NOTE — Progress Notes (Signed)
Dr.Fitzgerald informed of drop in B/P 82/48 (59) at 1:02.  Pt repositioned and arousable.  Order for 500cc bolus placed and given.  Will follow up with MD if no improvement in B/P.

## 2015-01-30 NOTE — Progress Notes (Signed)
Speech Therapy Note: Received consult order; reviewed chart notes. Pt is known to SLP services and has been evaluated during previous admissions. Due to his Dysphagia, pt has had a PEG tube for nutrition and has been on an oral diet of puree-solid foods w/ HONEY consistency liquids. In the past, Mother has requested the HONEY consistency liquids for pt d/t his dysphagia.  Currently, pt was admitted for concern of aspiration w/ po's; recently came off BiPAP this AM and is tolerating it well per NSG.  Discussed w/ MD the option of performing a MBSS prior to initiating any po's to objectively assess swallowing d/t pt's higher risk for aspiration/hisory of aspiration. Also rec'd no po's until the MBSS can be performed. MD agreed. Discussed w/ NSG. ST will f/u in AM w/ MBSS. Rec. Oral care frequently during the day.

## 2015-01-30 NOTE — Care Management Note (Signed)
Case Management Note  Patient Details  Name: [name] [name] MRN: [id number] Date of Birth: 08/22/1985  Subjective/Objective:    Spoke with patients mom. [name]. She would like patient [facility]. Clinic closed today. Following for discharge planning.                 Action/Plan:   Expected Discharge Date:                  Expected Discharge Plan:     In-House Referral:     Discharge planning Services  CM Consult  Post Acute Care Choice:    Choice offered to:     DME Arranged:    DME Agency:     HH Arranged:    HH Agency:     Status of Service:  In process, will continue to follow  Medicare Important Message Given:    Date Medicare IM Given:    Medicare IM give by:    Date Additional Medicare IM Given:    Additional Medicare Important Message give by:     If discussed at Long Length of Stay Meetings, dates discussed:    Additional Comments:  [name], RN 01/30/2015, 12:20 PM

## 2015-01-30 NOTE — Progress Notes (Signed)
Spoke to Dr. Jessie FootFitgerald- Pt stating that he is hungry- unable to reach [place] with dietary- Dr. Sampson GoonFitzgerald stated that he would place order for patient to have tube feeding.

## 2015-01-31 ENCOUNTER — Inpatient Hospital Stay: Payer: BLUE CROSS/BLUE SHIELD

## 2015-01-31 LAB — BASIC METABOLIC PANEL
ANION GAP: 7 (ref 5–15)
BUN: 15 mg/dL (ref 6–20)
CO2: 24 mmol/L (ref 22–32)
Calcium: 8.1 mg/dL — ABNORMAL LOW (ref 8.9–10.3)
Chloride: 113 mmol/L — ABNORMAL HIGH (ref 101–111)
Creatinine, Ser: 0.58 mg/dL — ABNORMAL LOW (ref 0.61–1.24)
GFR calc Af Amer: >60 mL/min (ref >60)
GFR calc non Af Amer: >60 mL/min (ref >60)
Glucose, Bld: 104 mg/dL — ABNORMAL HIGH (ref 65–99)
SODIUM: 144 mmol/L (ref 135–145)

## 2015-01-31 LAB — GLUCOSE, CAPILLARY
GLUCOSE-CAPILLARY: 103 mg/dL — AB (ref 65–99)
GLUCOSE-CAPILLARY: 103 mg/dL — AB (ref 65–99)
GLUCOSE-CAPILLARY: 96 mg/dL (ref 65–99)
Glucose-Capillary: 116 mg/dL — ABNORMAL HIGH (ref 65–99)
Glucose-Capillary: 118 mg/dL — ABNORMAL HIGH (ref 65–99)
Glucose-Capillary: 117 mg/dL — ABNORMAL HIGH (ref 65–99)

## 2015-01-31 LAB — VANCOMYCIN, TROUGH: VANCOMYCIN TR: 32 ug/mL — AB (ref 10–20)

## 2015-01-31 MED ORDER — JEVITY 1.5 CAL/FIBER PO LIQD
237.0000 mL | Freq: Once | ORAL | Status: AC
  Administered 2015-02-01: 237 mL

## 2015-01-31 MED ORDER — BISACODYL 10 MG RE SUPP
10.0000 mg | Freq: Every day | RECTAL | Status: DC
  Administered 2015-01-31 – 2015-02-13 (×13): 10 mg via RECTAL
  Filled 2015-01-31 (×12): qty 1

## 2015-01-31 MED ORDER — MUPIROCIN 2 % EX OINT
1.0000 "application " | TOPICAL_OINTMENT | Freq: Two times a day (BID) | CUTANEOUS | Status: AC
  Administered 2015-01-31 – 2015-02-04 (×9): 1 via NASAL
  Filled 2015-01-31 (×2): qty 22

## 2015-01-31 MED ORDER — CHLORHEXIDINE GLUCONATE CLOTH 2 % EX PADS
6.0000 | MEDICATED_PAD | Freq: Every day | CUTANEOUS | Status: AC
  Administered 2015-01-31 – 2015-02-04 (×5): 6 via TOPICAL

## 2015-01-31 MED ORDER — JEVITY 1.5 CAL/FIBER PO LIQD
237.0000 mL | Freq: Three times a day (TID) | ORAL | Status: DC
  Administered 2015-01-31 – 2015-02-04 (×10): 237 mL

## 2015-01-31 MED ORDER — FREE WATER
300.0000 mL | Freq: Four times a day (QID) | Status: DC
  Administered 2015-01-31 – 2015-02-03 (×9): 300 mL
  Administered 2015-02-03: 30 mL
  Administered 2015-02-03 – 2015-02-14 (×40): 300 mL

## 2015-01-31 NOTE — Progress Notes (Signed)
Pt refused bipap for this shift. O2 sats wdl on ra. 95%

## 2015-01-31 NOTE — Clinical Social Work Note (Signed)
Physician [facility]. RN CM will make that referral and physician is aware. York SpanielMonica Stephene Alegria MSW,LCSWA [phone]

## 2015-01-31 NOTE — Progress Notes (Signed)
Tube functioning well. Minimal irritation inferiorly where the tube rests on the skin. 47F foley in place. No new issues.
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
## 2015-01-31 NOTE — Progress Notes (Signed)
ANTIBIOTIC CONSULT NOTE - FOLLOW UP  Pharmacy Consult for Vancomycin Indication: pneumonia  Allergies  Allergen Reactions  . Ambien [Zolpidem] Other (See Comments)    Patient start hallucinating.   . Ativan [Lorazepam] Itching and Other (See Comments)    Patient gets hot and sweaty. Hallucinations   . Depakote Er [Divalproex Sodium Er] Itching and Other (See Comments)    Patient gets hot and sweaty.  . Dilaudid [Hydromorphone] Hives and Other (See Comments)    Patient gets hot and sweaty.  Marland Kitchen. Keppra [Levetiracetam] Itching and Other (See Comments)    Patient gets hot and sweaty.    Patient Measurements: Height: 6' (182.9 cm) Weight: 202 lb 6.1 oz (91.8 kg) IBW/kg (Calculated) : 77.6   Vital Signs: Temp: 97.7 F (36.5 C) (07/04 2345) Temp Source: Axillary (07/04 2345) BP: 117/80 mmHg (07/04 2345) Pulse Rate: 80 (07/04 2345) Intake/Output from previous day: 07/04 0701 - 07/05 0700 In: 2433.7 [I.V.:1387.7; NG/GT:696; IV Piggyback:350] Out: 825 [Urine:825] Intake/Output from this shift: Total I/O In: 1333.7 [I.V.:887.7; NG/GT:446] Out: 425 [Urine:425]  Labs:  Recent Labs  01/29/15 1326 01/30/15 0630  WBC 8.5 8.0  HGB 12.3* 10.3*  PLT 260 212  CREATININE 0.68 0.51*   Estimated Creatinine Clearance: 153.6 mL/min (by C-G formula based on Cr of 0.51).  Recent Labs  01/31/15 0349  VANCOTROUGH 32*     Microbiology: Recent Results (from the past 720 hour(s))  Blood culture (routine x 2)     Status: None (Preliminary result)   Collection Time: 01/29/15  1:10 PM  Result Value Ref Range Status   Specimen Description BLOOD  Final   Special Requests NONE  Final   Culture NO GROWTH < 24 HOURS  Final   Report Status PENDING  Incomplete  Blood culture (routine x 2)     Status: None (Preliminary result)   Collection Time: 01/29/15  1:26 PM  Result Value Ref Range Status   Specimen Description BLOOD  Final   Special Requests NONE  Final   Culture NO GROWTH < 24  HOURS  Final   Report Status PENDING  Incomplete  MRSA PCR Screening     Status: Abnormal   Collection Time: 01/29/15  4:02 PM  Result Value Ref Range Status   MRSA by PCR POSITIVE (A) NEGATIVE Final    Comment:        The GeneXpert MRSA Assay (FDA approved for NASAL specimens only), is one component of a comprehensive MRSA colonization surveillance program. It is not intended to diagnose MRSA infection nor to guide or monitor treatment for MRSA infections. CRITICAL RESULT CALLED TO, READ BACK BY AND VERIFIED WITH: CHERYL SMITH ON 01/29/15 AT 1750 BY JEF     Anti-infectives    Start     Dose/Rate Route Frequency Ordered Stop   01/29/15 1445  vancomycin (VANCOCIN) 1,250 mg in sodium chloride 0.9 % 250 mL IVPB  Status:  Discontinued     1,250 mg 166.7 mL/hr over 90 Minutes Intravenous Every 8 hours 01/29/15 1431 01/31/15 0427   01/29/15 1345  vancomycin (VANCOCIN) IVPB 1000 mg/200 mL premix     1,000 mg 200 mL/hr over 60 Minutes Intravenous STAT 01/29/15 1331 01/29/15 1900   01/29/15 1345  Ampicillin-Sulbactam (UNASYN) 3 g in sodium chloride 0.9 % 100 mL IVPB     3 g 100 mL/hr over 60 Minutes Intravenous Every 8 hours 01/29/15 1333     01/29/15 1330  piperacillin-tazobactam (ZOSYN) IVPB 4.5 g     4.5  g 200 mL/hr over 30 Minutes Intravenous  Once 01/29/15 1324 01/29/15 1400      Assessment: Patient with a h/o TBI admitted with respiratory failure due to LLL PNA on vancomycin and Unasyn with elevated vancomycin trough.   Goal of Therapy:  Vancomycin trough level 15-20 mcg/ml  Plan:  Will stop vancomycin for now and check a random level at 1130 in order to dose based on patient-specific kinetics.   Luisa Hart D 01/31/2015,4:31 AM

## 2015-01-31 NOTE — Progress Notes (Signed)
Dr. Sampson GoonFitzgerald and Dr. Essie HartBryrnette were notified of the leaking stomach contents around tube feeding observed post- dinner. Dr. Birdie SonsByrnette ordered to give the third bolus for the day at bedtime.

## 2015-01-31 NOTE — Progress Notes (Signed)
[facility]  PROGRESS NOTE Date of Admission:  01/29/2015     ID: [name] [name] is a 29 y.o. male with TBI, recurrent aspiration events admitted with resp distress after new caregiver fed him.  Active Problems:   Acute respiratory failure  Subjective: Out of unit, started tube feeds.  No agitation overnight  ROS  Eleven systems are reviewed and negative except per hpi  Medications:  Antibiotics Given (last 72 hours)    Date/Time Action Medication Dose Rate   01/29/15 1330 Given  [given in ER]   piperacillin-tazobactam (ZOSYN) IVPB 4.5 g 4.5 g 200 mL/hr   01/29/15 1936 Given   vancomycin (VANCOCIN) 1,250 mg in sodium chloride 0.9 % 250 mL IVPB 1,250 mg 166.7 mL/hr   01/30/15 0400 Given   vancomycin (VANCOCIN) 1,250 mg in sodium chloride 0.9 % 250 mL IVPB 1,250 mg 166.7 mL/hr   01/30/15 1208 Given   vancomycin (VANCOCIN) 1,250 mg in sodium chloride 0.9 % 250 mL IVPB 1,250 mg 166.7 mL/hr   01/30/15 2055 Given   vancomycin (VANCOCIN) 1,250 mg in sodium chloride 0.9 % 250 mL IVPB 1,250 mg 166.7 mL/hr     . ampicillin-sulbactam (UNASYN) IV  3 g Intravenous Q8H  . antiseptic oral rinse  7 mL Mouth Rinse q12n4p  . apixaban  5 mg Oral BID  . baclofen  20 mg Oral TID  . chlorhexidine  15 mL Mouth Rinse BID  . docusate sodium  100 mg Oral BID  . free water  200 mL Per Tube 3 times per day  . gabapentin  600 mg Oral TID  . indomethacin  75 mg Oral Daily  . lacosamide  200 mg Oral BID  . multivitamin with minerals  1 tablet Oral Daily  . nortriptyline  100 mg Oral QHS  . OLANZapine  15 mg Oral QHS  . pantoprazole (PROTONIX) IV  40 mg Intravenous Q12H  . pregabalin  75 mg Oral TID  . propranolol  20 mg Per Tube TID  . risperiDONE  1 mg Oral BID    Objective: Vital signs in last 24 hours: Temp:  [97.7 F (36.5 C)-98.3 F (36.8 C)] 98 F (36.7 C) (07/05 0732) Pulse Rate:  [66-81] 66 (07/05 0732) Resp:  [3-17] 16 (07/05 0732) BP: (98-139)/(66-95) 139/95 mmHg (07/05  0732) SpO2:  [93 %-100 %] 94 % (07/05 0732) Weight:  [98.068 kg (216 lb 3.2 oz)] 98.068 kg (216 lb 3.2 oz) (07/05 0500)  GENERAL: 29 y.o.-year-old patient chroniocallyu ill-appearing EYES: Pupils equal, round, reactive to light and accommodation. No scleral icterus. Extraocular muscles intact.  HEENT: Head atraumatic, normocephalic. Oropharynx and nasopharynx clear.  NECK: Supple, no jugular venous distention. No thyroid enlargement, no tenderness.  LUNGS: Left lung with rhonchi,  CARDIOVASCULAR: S1, S2 normal. .  ABDOMEN: Soft, nontender, nondistended. Bowel sounds present. No organomegaly or mass. PEG tube in place - wnl.  EXTREMITIES: No pedal edema, cyanosis, or clubbing.  NEUROLOGIC: Limited due to patient not able to follow commands  PSYCHIATRIC: The patient is alert and oriented x 3.  SKIN: No obvious rash, lesion, or ulcer.   Lab Results  Recent Labs  01/29/15 1326 01/30/15 0630 01/31/15 0349  WBC 8.5 8.0  --   HGB 12.3* 10.3*  --   HCT 38.4* 32.2*  --   NA 145 144 144  K 3.7 3.8 NOT VALID  CL 108 112* 113*  CO2 31 26 24   BUN 17 19 15   CREATININE 0.68 0.51* 0.58*  Microbiology: Results for orders placed or performed during the hospital encounter of 01/29/15  Blood culture (routine x 2)     Status: None (Preliminary result)   Collection Time: 01/29/15  1:10 PM  Result Value Ref Range Status   Specimen Description BLOOD  Final   Special Requests NONE  Final   Culture NO GROWTH < 24 HOURS  Final   Report Status PENDING  Incomplete  Blood culture (routine x 2)     Status: None (Preliminary result)   Collection Time: 01/29/15  1:26 PM  Result Value Ref Range Status   Specimen Description BLOOD  Final   Special Requests NONE  Final   Culture NO GROWTH < 24 HOURS  Final   Report Status PENDING  Incomplete  MRSA PCR Screening     Status: Abnormal   Collection Time: 01/29/15  4:02 PM  Result Value Ref Range Status   MRSA by PCR POSITIVE (A) NEGATIVE Final     Comment:        The GeneXpert MRSA Assay (FDA approved for NASAL specimens only), is one component of a comprehensive MRSA colonization surveillance program. It is not intended to diagnose MRSA infection nor to guide or monitor treatment for MRSA infections. CRITICAL RESULT CALLED TO, READ BACK BY AND VERIFIED WITH: CHERYL SMITH ON 01/29/15 AT 1750 BY JEF     Studies/Results: Dg Chest 1 View  01/29/2015   CLINICAL DATA:  Hypoxic  EXAM: CHEST  1 VIEW  COMPARISON:  09/18/2014  FINDINGS: Stable thoracic levoscoliosis. Patchy airspace opacities in the mid and lower left lung, increased from previous exam. Right lung clear. Relatively low lung volumes. Heart size upper limits normal for technique. No pneumothorax. No effusion.  IMPRESSION: 1. Progressive left mid and lower lung airspace opacities suggesting pneumonia.   Electronically Signed   By: Corlis Leak  Hassell M.D.   On: 01/29/2015 13:02   Dg Abd 1 View  01/29/2015   CLINICAL DATA:  Bedside gastrostomy tube replacement. Confirmation of tube positioning.  EXAM: Portable ABDOMEN - 1 VIEW  COMPARISON:  Visualized upper abdomen on CTA chest yesterday.  FINDINGS: 20 ml CC Omnipaque 300 were administered via the gastrostomy tube. The tip of the tube is in the distal portion of the stomach at the pylorus. Normal-appearing duodenum and jejunum is opacified. No evidence of contrast extravasation. Visualized bowel gas pattern unremarkable.  IMPRESSION: Appropriate positioning of the gastrostomy tube with its tip at the gastric pylorus.   Electronically Signed   By: Hulan Saashomas  Lawrence M.D.   On: 01/29/2015 17:46   Ct Angio Chest Pe W/cm &/or Wo Cm  01/29/2015   CLINICAL DATA:  Low oxygen saturation, where CPAP at night, hypoxemia, no urine output since last night, history attic traumatic brain injury, spastic to complete GI, hepatitis-C, former smoker  EXAM: CT ANGIOGRAPHY CHEST WITH CONTRAST  TECHNIQUE: Multidetector CT imaging of the chest was performed using  the standard protocol during bolus administration of intravenous contrast. Multiplanar CT image reconstructions and MIPs were obtained to evaluate the vascular anatomy.  CONTRAST:  100mL OMNIPAQUE IOHEXOL 350 MG/ML SOLN IV  COMPARISON:  09/17/2014  FINDINGS: Gastrostomy tube at gastric fundus.  Remaining visualized portion of upper abdomen normal appearance.  No thoracic adenopathy.  Aorta normal caliber without aneurysm or dissection.  Pulmonary arteries adequately opacified and patent.  No evidence of pulmonary embolism.  Central peribronchial thickening.  Patchy airspace infiltrates identified in LEFT upper and LEFT lower lobes consistent with pneumonia.  Dependent atelectasis in  RIGHT lower lobe.  No pleural effusion or pneumothorax.  Chronic height loss of a mid to lower thoracic vertebra unchanged.  Review of the MIP images confirms the above findings.  IMPRESSION: No evidence of pulmonary embolism.  Patchy infiltrates in LEFT upper and LEFT lower lobes consistent with pneumonia.  Dependent atelectasis RIGHT lower lobe.   Electronically Signed   By: Ulyses Southward M.D.   On: 01/29/2015 15:47    Assessment/Plan: Patient is a 29 year old bedbound male with history of traumatic brain injury presents with acute respiratory failure 1. Acute respiratory failure due to left lower lobe pneumonia likely as result of aspiration pneumonia.  Continue  Unasyn - dced vancomycin (MRSA screen +) Weaned off BiPAP therapy,  - hold feeds until SS does barium swallow todau   PEG tube  - mother asks re changing to ballon peg - consult surg- had been seen by Dr Lemar Livings as otpt but put in by Dr Katrinka Blazing   History of pulmonary embolism; continue Eliquis as taking at home    Traumatic brain injury; continue baclofen, and gabapentin, Lyrica, nortriptyline,   GERD continue Protonix  FITZGERALD, DAVID   01/31/2015, 8:17 AM

## 2015-01-31 NOTE — Care Management (Signed)
Patient transferred to [facility].  [facility] [phone]. Left information, awaiting call back from admissions nurse.

## 2015-01-31 NOTE — Progress Notes (Addendum)
Nutrition Follow-up   INTERVENTION:  Medical Food Supplement Therapy: pt likes Magic Cup, per mother and patient request will send Magic cup TID with meals, each supplement provides 290 kcal and 9 grams of protein. [facility] order [facility] from [name] as [facility], will discuss with Mother Coordination of Care: discussed nutritional poc with Annabella RN and pt's mother. Plan to start calorie count to better assess average caloric intake; per mother, intake can vary widely from day to day. Pt currently uses TwoCal at home; do not have on hospital formulary at present. Mother ok with using Jevity 1.5 formula. Recommend discontinuing continuous feeding of Jevity 1.2 as diet advanced and start bolus feedings at present, plan to start with 1 can of Jevity 1.5 TID between meals. Each can provides 355 kcals, 15 g of protein and 180 mL free water (additional 100 mL free water before and after feeding). Mother reports pt always takes at least 300 mL of free water QID, per mother request will continue these as standard free water flush. As outpatient, Best plan may be to recommend administration of bolus feeding if pt does eats very little (bites) or nothing at a given meal period.   If pt eats nothing in a 24 hour period, can recommend number of cans to administer to meet estimated nutritional needs. Continue to assess    NUTRITION DIAGNOSIS:  Inadequate oral intake related to inability to eat as evidenced by NPO status. Being addressed as diet advanced, adding supplement, bolus feedings between meals.    GOAL:  Patient will meet greater than or equal to 90% of their needs   MONITOR:   (Energy Intake, EN, Digestive System, Electrolyte/Renal Profile, Glucose Profile)  ASSESSMENT:  Pt s/p MBSS today, diet advanced  Diet Order: Dysphagia II, Honey Thick  Energy Intake: no po intake of meals yet  Previous nutrition: mother reports that pt intake can vary widely from day to day. Does  supplement with a can of TwoCal here and there when he does not eat; mother not very specific with regards to intake.   EN: tolerating Jevity 1.2 TF at rate of 70 ml/hr  Digestive System: +BM 7/3, minimal residuals   Height:  Ht Readings from Last 1 Encounters:  01/29/15 6' (1.829 m)    Weight:  Wt Readings from Last 1 Encounters:  01/31/15 216 lb 3.2 oz (98.068 kg)    Ideal Body Weight:     Wt Readings from Last 10 Encounters:  01/31/15 216 lb 3.2 oz (98.068 kg)  11/09/14 190 lb (86.183 kg)  02/04/12 165 lb (74.844 kg)  05/23/11 168 lb (76.204 kg)    BMI:  Body mass index is 29.32 kg/(m^2).  Estimated Nutritional Needs:  Kcal:  1853-2317 (BEE 1931, 1.2 AF, 0.8-1.0IF)  Protein:  92-110 g (1.0-1.2 g/kg)   Fluid:  2300-2850 mL (25-30 ml/kg)   Skin:  Reviewed, no issues  Diet Order:  DIET DYS 2 Room service appropriate?: Yes; Fluid consistency:: Honey Thick    Intake/Output Summary (Last 24 hours) at 01/31/15 1359 Last data filed at 01/31/15 1139  Gross per 24 hour  Intake 2739.67 ml  Output   1560 ml  Net 1179.67 ml    HIGH Care Level  Romelle Starcherate Kristyanna Barcelo MS, RD, LDN [phone] Pager

## 2015-02-01 LAB — GLUCOSE, CAPILLARY
GLUCOSE-CAPILLARY: 102 mg/dL — AB (ref 65–99)
Glucose-Capillary: 91 mg/dL (ref 65–99)
Glucose-Capillary: 101 mg/dL — ABNORMAL HIGH (ref 65–99)
Glucose-Capillary: 101 mg/dL — ABNORMAL HIGH (ref 65–99)
Glucose-Capillary: 87 mg/dL (ref 65–99)
Glucose-Capillary: 108 mg/dL — ABNORMAL HIGH (ref 65–99)

## 2015-02-01 LAB — VANCOMYCIN, TROUGH: VANCOMYCIN TR: 5 ug/mL — AB (ref 10–20)

## 2015-02-01 MED ORDER — PIPERACILLIN-TAZOBACTAM 3.375 G IVPB 30 MIN: 3.3750 g | Freq: Three times a day (TID) | INTRAVENOUS | Status: DC

## 2015-02-01 MED ORDER — PIPERACILLIN-TAZOBACTAM 3.375 G IVPB
3.3750 g | Freq: Three times a day (TID) | INTRAVENOUS | Status: DC
  Administered 2015-02-01 – 2015-02-08 (×22): 3.375 g via INTRAVENOUS
  Filled 2015-02-01 (×27): qty 50

## 2015-02-01 MED ORDER — VANCOMYCIN HCL IN DEXTROSE 1-5 GM/200ML-% IV SOLN
1000.0000 mg | Freq: Two times a day (BID) | INTRAVENOUS | Status: DC
  Administered 2015-02-01 – 2015-02-07 (×14): 1000 mg via INTRAVENOUS
  Filled 2015-02-01 (×15): qty 200

## 2015-02-01 MED ORDER — VANCOMYCIN HCL IN DEXTROSE 1-5 GM/200ML-% IV SOLN: 1000.0000 mg | Freq: Two times a day (BID) | INTRAVENOUS | Status: DC

## 2015-02-01 NOTE — Plan of Care (Signed)
Problem: SLP Dysphagia Goals Goal: Misc Dysphagia Goal Pt will safely tolerate po diet of least restrictive consistency w/ no overt s/s of aspiration noted by Staff/pt/family x3 sessions.    
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
## 2015-02-01 NOTE — Care Management (Signed)
Spoke with the patients mother Jeronimo NormaJeanie to discuss inpatient care at [facility].  She stated that should patient not be approved for admission that she would be able to take patient back to home. Patient has all  Supportive equipment and services at home.Continue to follow.

## 2015-02-01 NOTE — Progress Notes (Signed)
Brief Nutrition Follow-up:  Spoke with Emily RN; pt [name] Burtontolerated bolus feeding this AM. Noted concern with regards to leakage of stomah contents around tube, MD was notified by nursing. Per [name] BurtonEmily RN, no leaking noted this AM. Calorie count in progress. Mother not in room on visit today. Per documentation, pt ate fairly well at dinner last night, 50% of pasta with meat sauce, 100% of carrots and fruit. Calorie count continues. Recommend continuing current TF regimen, nursing may adjust times of bolus feedings if needed.

## 2015-02-01 NOTE — Care Management (Signed)
Spoke with Admission nurse  Britta [facility]ccreedyBarbara at [facility]. Patient was previously an inpatient there and released. Will not accept unless significant change in condition.  Packet sent to [facility] for [facility] by physician.  Admission coordinator is Mrs Neil CrouchCrane [phone]. Awaiting review. Discussed this with Dr Cleatis PolkaMilller. OK to continue to pursue.
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
## 2015-02-01 NOTE — Progress Notes (Addendum)
Changed dressing at peg tube site. Skin looks very redden, raised and irritated. Cleansed area with sterile H2O and applied drain sponged and 4x4 gauze at site. Small amount of leakage of a serous yellowish/brown fluid noted. Will f/u with wound care nurse and M.D. To intervene for treatment. Caretaker "Olegario MessierKathy" at the bedside to witness.

## 2015-02-01 NOTE — Progress Notes (Signed)
Date: 02/01/2015,   MRN# [id number] [name] [name] 05/24/1986 Code Status:     Code Status Orders       Hosp day:@LENGTHOFSTAYDAYS @ Referring MD: @         HPI: In bed, care giver present, here with recurrent aspiration pneumonias.  PMHX:   Past Medical History  Diagnosis Date  . Hepatitis C   . Scoliosis   . 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 Subarachnoid hemorrhage   . Pulmonary emboli   . Hepatitis C   . Depression    Surgical Hx:  Past Surgical History  Procedure Laterality Date  . Fracture surgery      left acetabulum  . Ileostomy    . Lung tube    . Ankle surgery Bilateral   . Tracheostomy    . Peg tube placement     Family Hx:  Family History  Problem Relation Age of Onset  . Hypertension     Social Hx:   History  Substance Use Topics  . Smoking status: Former Smoker    Quit date: 08/07/2011  . Smokeless tobacco: Never Used  . Alcohol Use: No   Medication:    Home Medication:  No current outpatient prescriptions on file.  Current Medication: @   Allergies:  Ambien; Ativan; Depakote er; Dilaudid; and Keppra  Review of Systems: Unable to get  Physical Examination:   VS: BP 111/77 mmHg  Pulse 75  Temp(Src) 98.2 F (36.8 C) (Oral)  Resp 18  Ht 6' (1.829 m)  Wt 214 lb 8 oz (97.297 kg)  BMI 29.09 kg/m2  SpO2 94%  General Appearance: No distress, head tilted to the right   Neuro/psych:  Alert, Seem to follow simple commands  HEENT: PERRLA, EOM intact, no ptosis, no other lesions noticed, ols trach site present Pulmonary:.No wheezing, No rales  Somewhat coarse breath sounds   Cardiovascular:  Normal S1,S2.  No m/r/g.  Abdominal aorta pulsation normal.    Abdomen:Benign, Soft, non-tender, No masses, hepatosplenomegaly, No lymphadenopathy Endoc: No evident thyromegaly, no signs of acromegaly or Cushing features Skin:   warm, no rashes, no ecchymosis  Extremities: normal, no cyanosis, clubbing, no edema, warm with normal  capillary refill.   Labs results:   Recent Labs     01/30/15  0630  01/31/15  0349  HGB  10.3*   --   HCT  32.2*   --   MCV  81.7   --   WBC  8.0   --   BUN  19  15  CREATININE  0.51*  0.58*  GLUCOSE  75  104*  CALCIUM  7.7*  8.1*  ,       Assessment and Plan: Acute respiratory failure, due to left lower lobe and upper lobe pneumonia likely as result of aspiration pneumonia and possible failed outpatient therapy. Marland KitchenHistory of recurrent pneumonias   -Continue with antibiotics regimen as stated above -Continue to maintain saturations above 88%, BiPAP at night, intermittent BiPAP during the day, incentive spirometry. - Diet recommendations: Dysphagia 2 (fine chop);Honey-thick liquid Liquids provided via: Teaspoon;Cup;Straw Medication Administration: Crushed with puree Supervision: Full supervision/cueing for compensatory strategies;Trained caregiver to feed patient;Staff to assist with self feeding Compensations: Slow rate;Small sips/bites;Follow solids with liquid Postural Changes and/or Swallow Maneuvers: Seated upright 90 degrees  History of pulmonary embolism; continue Eliquis as taking at home   Traumatic brain injury; continue baclofen, and gabapentin, Lyrica, nortriptyline,   GERD continue Protonix  Hypertension  -Secondary to underlying infection, continue antibiotics,  maintain systolics in the 100s, gentle IV fluids if needed.      I have personally obtained a history, examined the patient, evaluated laboratory and imaging results, formulated the assessment and plan and placed orders.  The Patient requires high complexity decision making for assessment and support, frequent evaluation and titration of therapies, application of advanced monitoring technologies and extensive interpretation of multiple databases.   Dani Wallner,M.D. Pulmonary & Critical care Medicine [facility]

## 2015-02-01 NOTE — Progress Notes (Signed)
Patient refused bipap.

## 2015-02-02 ENCOUNTER — Inpatient Hospital Stay: Payer: BLUE CROSS/BLUE SHIELD

## 2015-02-02 LAB — GLUCOSE, CAPILLARY
Glucose-Capillary: 100 mg/dL — ABNORMAL HIGH (ref 65–99)
Glucose-Capillary: 100 mg/dL — ABNORMAL HIGH (ref 65–99)
Glucose-Capillary: 99 mg/dL (ref 65–99)

## 2015-02-02 LAB — BASIC METABOLIC PANEL WITH GFR
Anion gap: 6 (ref 5–15)
BUN: 12 mg/dL (ref 6–20)
CO2: 29 mmol/L (ref 22–32)
Calcium: 8.6 mg/dL — ABNORMAL LOW (ref 8.9–10.3)
Chloride: 109 mmol/L (ref 101–111)
Creatinine, Ser: 0.64 mg/dL (ref 0.61–1.24)
GFR calc Af Amer: >60 mL/min
GFR calc non Af Amer: >60 mL/min
Glucose, Bld: 99 mg/dL (ref 65–99)
Potassium: 3.6 mmol/L (ref 3.5–5.1)
Sodium: 144 mmol/L (ref 135–145)

## 2015-02-02 LAB — CBC WITH DIFFERENTIAL/PLATELET
Basophils Absolute: 0 K/uL (ref 0–0.1)
Basophils Relative: 1 %
Eosinophils Absolute: 0.1 K/uL (ref 0–0.7)
Eosinophils Relative: 1 %
HCT: 34.6 % — ABNORMAL LOW (ref 40.0–52.0)
Hemoglobin: 11.1 g/dL — ABNORMAL LOW (ref 13.0–18.0)
Lymphocytes Relative: 33 %
Lymphs Abs: 1.7 K/uL (ref 1.0–3.6)
MCH: 26.2 pg (ref 26.0–34.0)
MCHC: 32.1 g/dL (ref 32.0–36.0)
MCV: 81.6 fL (ref 80.0–100.0)
Monocytes Absolute: 0.5 K/uL (ref 0.2–1.0)
Monocytes Relative: 10 %
Neutro Abs: 2.9 K/uL (ref 1.4–6.5)
Neutrophils Relative %: 55 %
Platelets: 263 K/uL (ref 150–440)
RBC: 4.25 MIL/uL — ABNORMAL LOW (ref 4.40–5.90)
RDW: 16.7 % — ABNORMAL HIGH (ref 11.5–14.5)
WBC: 5.2 K/uL (ref 3.8–10.6)

## 2015-02-02 LAB — VANCOMYCIN, TROUGH: Vancomycin Tr: 15 ug/mL (ref 10–20)

## 2015-02-02 NOTE — Progress Notes (Signed)
Date: 02/02/2015,   MRN# [id number] Fayrene [name] Choate 04/29/1986 Code Status:     Code Status Orders        Start     Ordered   01/29/15 1558  Full code   Continuous     01/29/15 1557    Advance Directive Documentation        Most Recent Value   Type of Advance Directive  Healthcare Power of Attorney   Pre-existing out of facility DNR order (yellow form or pink MOST form)     "MOST" Form in Place?        HPI: no new problems, no worsening sob, no fever, per nurse no concerns.   PMHX:   Past Medical History  Diagnosis Date  . Hepatitis C   . Scoliosis   . 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 Subarachnoid hemorrhage   . Pulmonary emboli   . Hepatitis C   . Depression    Surgical Hx:  Past Surgical History  Procedure Laterality Date  . Fracture surgery      left acetabulum  . Ileostomy    . Lung tube    . Ankle surgery Bilateral   . Tracheostomy    . Peg tube placement     Family Hx:  Family History  Problem Relation Age of Onset  . Hypertension     Social Hx:   History  Substance Use Topics  . Smoking status: Former Smoker    Quit date: 08/07/2011  . Smokeless tobacco: Never Used  . Alcohol Use: No   Medication:    Home Medication:  No current outpatient prescriptions on file.  Current Medication: @CURMEDTAB @   Allergies:  Ambien; Ativan; Depakote er; Dilaudid; and Keppra  Review of Systems: Unable to obtain  Physical Examination:   VS: BP 137/80 mmHg  Pulse 78  Temp(Src) 98.7 F (37.1 C) (Oral)  Resp 19  Ht 6' (1.829 m)  Wt 214 lb 6.4 oz (97.251 kg)  BMI 29.07 kg/m2  SpO2 91%  General Appearance: No distress friend laying bed with him Neuro: no change  HEENT: PERRLA, EOM intact, no ptosis, no other lesions noticed,  Pulmonary:.No wheezing, No rales  Decrease at right base   Cardiovascular:  Normal S1,S2.  No m/r/g.  Abdominal aorta pulsation normal.    Abdomen:Benign, Soft, non-tender, No masses, hepatosplenomegaly, No  lymphadenopathy Endoc: No evident thyromegaly, no signs of acromegaly or Cushing features Skin:   warm, no rashes, no ecchymosis  Extremities: normal, no cyanosis, clubbing, no edema, warm with normal capillary refill. Other findings:   Labs results:   Recent Labs     01/31/15  0349  02/02/15  0652  HGB   --   11.1*  HCT   --   34.6*  MCV   --   81.6  WBC   --   5.2  BUN  15  12  CREATININE  0.58*  0.64  GLUCOSE  104*  99  CALCIUM  8.1*  8.6*  ,    No results for input(s): PH in the last 72 hours.  Invalid input(s): PCO2, PO2, BASEEXCESS, BASEDEFICITE, TFT  Culture results:     Rad results:   Dg Chest Port 1 View  02/02/2015   CLINICAL DATA:  Evaluate for pneumonia  EXAM: PORTABLE CHEST - 1 VIEW  COMPARISON:  01/29/2015  FINDINGS: Heart size is normal. Lung volumes are low. There is a persistent opacity in the left lung base. Right lung appears clear.  IMPRESSION:  1. Persistent left lung opacity compatible with pneumonia.   Electronically Signed   By: Signa Kell M.D.   On: 02/02/2015 09:10    Assessment and Plan: Acute respiratory failure, due to left lower lobe and upper lobe pneumonia likely as result of aspiration pneumonia and possible failed outpatient therapy. Marland KitchenHistory of recurrent pneumonias, no new complaints.   -Continue with antibiotics (vanc/zosyn)regimen as stated above -Continue to maintain saturations above 88%, BiPAP at night, intermittent BiPAP during the day, incentive spirometry. - Diet recommendations: Dysphagia 2 (fine chop);Honey-thick liquid Liquids provided via: Teaspoon;Cup;Straw Medication Administration: Crushed with puree Supervision: Full supervision/cueing for compensatory strategies;Trained caregiver to feed patient;Staff to assist with self feeding Compensations: Slow rate;Small sips/bites;Follow solids with liquid Postural Changes and/or Swallow Maneuvers: Seated upright 90 degrees  History of pulmonary embolism; continue Eliquis as  taking at home   GERD continue Protonix  Hypertension  -Secondary to underlying infection, continue antibiotics, maintain systolics in the 100s, gentle IV fluids if    I have personally obtained a history, examined the patient, evaluated laboratory and imaging results, formulated the assessment and plan and placed orders.  The Patient requires high complexity decision making for assessment and support, frequent evaluation and titration of therapies, application of advanced monitoring technologies and extensive interpretation of multiple databases.   Erice Ahles,M.D. Pulmonary & Critical care Medicine [facility]

## 2015-02-02 NOTE — Progress Notes (Signed)
Speech Therapy Note: reviewed chart notes; went by pt's room and spoke w/ caregiver and NSG who were in the room; NSG giving meds via PEG tube. Pt is currently sleepy and lethargic. Caregiver stated she was not feeding him this morning d/t this. Briefly discussed general aspiration precautions w/ caregiver and NSG, especially noting when pt is too lethargic to be safe w/ any po's.  NSG briefly discussed assessing for residuals; this may be important to do w/ pt as family and caregiver have been concerned about giving pt too much TFs via PEG and have reported that pt's stomach becomes distended at times w/ feedings. This GI presentation can increase pt's risk for aspiration of refluxed TF material. Rec. Continued discussion w/ Dietician, GI re: feeding poc. ST will f/u tomorrow.

## 2015-02-02 NOTE — Progress Notes (Signed)
[name] [name] is a 29 y.o. male   SUBJECTIVE:  Patient admitted with pneumonia, cough, congestion, fever improving.  ______________________________________________________________________  ROS: Review of systems is unremarkable for any active cardiac,respiratory, GI, GU, hematologic, neurologic or psychiatric systems, 10 systems reviewed.  Marland Kitchen. antiseptic oral rinse  7 mL Mouth Rinse q12n4p  . apixaban  5 mg Oral BID  . baclofen  20 mg Oral TID  . bisacodyl  10 mg Rectal QHS  . chlorhexidine  15 mL Mouth Rinse BID  . Chlorhexidine Gluconate Cloth  6 each Topical Q0600  . docusate sodium  100 mg Oral BID  . feeding supplement (JEVITY 1.5 CAL/FIBER)  237 mL Per Tube TID BM  . free water  300 mL Per Tube 4 times per day  . gabapentin  600 mg Oral TID  . indomethacin  75 mg Oral Daily  . lacosamide  200 mg Oral BID  . multivitamin with minerals  1 tablet Oral Daily  . mupirocin ointment  1 application Nasal BID  . nortriptyline  100 mg Oral QHS  . OLANZapine  15 mg Oral QHS  . pantoprazole (PROTONIX) IV  40 mg Intravenous Q12H  . piperacillin-tazobactam (ZOSYN)  IV  3.375 g Intravenous 3 times per day  . pregabalin  75 mg Oral TID  . propranolol  20 mg Per Tube TID  . risperiDONE  1 mg Oral BID  . vancomycin  1,000 mg Intravenous Q12H   acetaminophen **OR** acetaminophen, albuterol, ondansetron **OR** ondansetron (ZOFRAN) IV   Past Medical History  Diagnosis Date  . Hepatitis C   . Scoliosis   . 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 Subarachnoid hemorrhage   . Pulmonary emboli   . Hepatitis C   . Depression     Past Surgical History  Procedure Laterality Date  . Fracture surgery      left acetabulum  . Ileostomy    . Lung tube    . Ankle surgery Bilateral   . Tracheostomy    . Peg tube placement      PHYSICAL EXAM:  BP 134/83 mmHg  Pulse 76  Temp(Src) 99.2 F (37.3 C) (Oral)  Resp 18  Ht 6' (1.829 m)  Wt 97.251 kg (214 lb 6.4 oz)  BMI 29.07 kg/m2  SpO2  92%  Wt Readings from Last 3 Encounters:  02/02/15 97.251 kg (214 lb 6.4 oz)  11/09/14 86.183 kg (190 lb)  02/04/12 74.844 kg (165 lb)           BP Readings from Last 3 Encounters:  02/01/15 134/83  11/09/14 102/58  02/04/12 131/68    Constitutional: NAD Neck: supple, no thyromegaly Respiratory: CTA, no rales or wheezes Cardiovascular: RRR, no murmur, no gallop Abdomen: soft, good BS, nontender Extremities: no edema Neuro: alert and oriented, no focal motor or sensory deficits  ASSESSMENT/PLAN:  Labs and imaging studies were reviewed  Aspiration pneumonia-fever and symptoms improving since switched to vancomycin/Zosyn Closed head injury-stable Malnutrition-using G-tube Skilled nursing placement versus home

## 2015-02-02 NOTE — Clinical Documentation Improvement (Signed)
Per Progress Note July 7: "Malnutrition -- using G-tube"  Can this patient's degree of malnutrition be further specified, for example:  --Mild (first degree) --Moderate (second degree) --Severe (third degree) --Unable to suspect or determine --Other, please specify:   Thank You, Alesia RichardsAnne Eloyse Causey, RN CDS [facility] Health Information Management [email or web address] [phone]

## 2015-02-02 NOTE — Clinical Documentation Improvement (Signed)
Per ED Provider Note 01/29/15: "Has paralysis of the left upper extremity as well as the bilateral lower extremities."  Per Clinical Note 01/30/15: "29 year old male past medical history of traumatic brain injury, left-sided paralysis"  Can this patient's functional status possibly be further specified, for example: (please specify in your progress note)  Quadraplegia Fuctional quadraplegia Paraplegia Normal functional status Unable to suspect or determine Other, please specify:    Thank you,  Alesia RichardsAnne Naevia Unterreiner, RN CDS [facility] Health Information Management [email or web address] [phone]
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
## 2015-02-02 NOTE — Progress Notes (Signed)
Pt alert. Educated pt's visitor on checking residual for peg-tube, pt's visitor verbalized understanding.

## 2015-02-03 LAB — CBC WITH DIFFERENTIAL/PLATELET
Basophils Absolute: 0 10*3/uL (ref 0–0.1)
Basophils Relative: 1 %
EOS PCT: 1 %
Eosinophils Absolute: 0.1 10*3/uL (ref 0–0.7)
HCT: 35.4 % — ABNORMAL LOW (ref 40.0–52.0)
HEMOGLOBIN: 11.4 g/dL — AB (ref 13.0–18.0)
LYMPHS ABS: 1.5 10*3/uL (ref 1.0–3.6)
LYMPHS PCT: 28 %
MCH: 26.6 pg (ref 26.0–34.0)
MCHC: 32.3 g/dL (ref 32.0–36.0)
MCV: 82.1 fL (ref 80.0–100.0)
MONO ABS: 0.7 10*3/uL (ref 0.2–1.0)
Monocytes Relative: 13 %
NEUTROS PCT: 57 %
Neutro Abs: 3.1 10*3/uL (ref 1.4–6.5)
Platelets: 263 10*3/uL (ref 150–440)
RBC: 4.31 MIL/uL — AB (ref 4.40–5.90)
RDW: 16.5 % — ABNORMAL HIGH (ref 11.5–14.5)
WBC: 5.4 10*3/uL (ref 3.8–10.6)

## 2015-02-03 LAB — GLUCOSE, CAPILLARY
GLUCOSE-CAPILLARY: 112 mg/dL — AB (ref 65–99)
GLUCOSE-CAPILLARY: 86 mg/dL (ref 65–99)
Glucose-Capillary: 107 mg/dL — ABNORMAL HIGH (ref 65–99)
Glucose-Capillary: 112 mg/dL — ABNORMAL HIGH (ref 65–99)
Glucose-Capillary: 119 mg/dL — ABNORMAL HIGH (ref 65–99)
Glucose-Capillary: 164 mg/dL — ABNORMAL HIGH (ref 65–99)

## 2015-02-03 LAB — CULTURE, BLOOD (ROUTINE X 2)
CULTURE: NO GROWTH
Culture: NO GROWTH

## 2015-02-03 LAB — BASIC METABOLIC PANEL
Anion gap: 7 (ref 5–15)
BUN: 10 mg/dL (ref 6–20)
CALCIUM: 8.6 mg/dL — AB (ref 8.9–10.3)
CO2: 28 mmol/L (ref 22–32)
Chloride: 112 mmol/L — ABNORMAL HIGH (ref 101–111)
Creatinine, Ser: 0.7 mg/dL (ref 0.61–1.24)
GFR calc non Af Amer: >60 mL/min (ref >60)
GLUCOSE: 103 mg/dL — AB (ref 65–99)
Potassium: 3.9 mmol/L (ref 3.5–5.1)
SODIUM: 147 mmol/L — AB (ref 135–145)

## 2015-02-03 LAB — VANCOMYCIN, TROUGH: VANCOMYCIN TR: 15 ug/mL (ref 10–20)

## 2015-02-03 MED ORDER — FUROSEMIDE 10 MG/ML IJ SOLN
20.0000 mg | Freq: Once | INTRAMUSCULAR | Status: AC
  Administered 2015-02-03: 20 mg via INTRAVENOUS
  Filled 2015-02-03: qty 2

## 2015-02-03 NOTE — Plan of Care (Signed)
Problem: Phase I Progression Outcomes Goal: OOB as tolerated unless otherwise ordered Outcome: Not Applicable Date Met:  33/58/25 Pt is a quadriplegic

## 2015-02-03 NOTE — Progress Notes (Signed)
Patient ID: [name] [name], male   DOB: 01/03/1986, 29 y.o.   MRN: [id number] [name] [name] is a 29 y.o. male   SUBJECTIVE:  Patient admitted with pneumonia, cough, congestion, fever improving. Continues to be mildly hypoxic, requiring O2.  ______________________________________________________________________  ROS: Review of systems is unremarkable for any active cardiac,respiratory, GI, GU, hematologic, neurologic or psychiatric systems, 10 systems reviewed.  Marland Kitchen. antiseptic oral rinse  7 mL Mouth Rinse q12n4p  . apixaban  5 mg Oral BID  . baclofen  20 mg Oral TID  . bisacodyl  10 mg Rectal QHS  . chlorhexidine  15 mL Mouth Rinse BID  . Chlorhexidine Gluconate Cloth  6 each Topical Q0600  . docusate sodium  100 mg Oral BID  . feeding supplement (JEVITY 1.5 CAL/FIBER)  237 mL Per Tube TID BM  . free water  300 mL Per Tube 4 times per day  . furosemide  20 mg Intravenous Once  . gabapentin  600 mg Oral TID  . indomethacin  75 mg Oral Daily  . lacosamide  200 mg Oral BID  . multivitamin with minerals  1 tablet Oral Daily  . mupirocin ointment  1 application Nasal BID  . nortriptyline  100 mg Oral QHS  . OLANZapine  15 mg Oral QHS  . pantoprazole (PROTONIX) IV  40 mg Intravenous Q12H  . piperacillin-tazobactam (ZOSYN)  IV  3.375 g Intravenous 3 times per day  . pregabalin  75 mg Oral TID  . propranolol  20 mg Per Tube TID  . risperiDONE  1 mg Oral BID  . vancomycin  1,000 mg Intravenous Q12H   acetaminophen **OR** acetaminophen, albuterol, ondansetron **OR** ondansetron (ZOFRAN) IV   Past Medical History  Diagnosis Date  . Hepatitis C   . Scoliosis   . 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 Subarachnoid hemorrhage   . Pulmonary emboli   . Hepatitis C   . Depression     Past Surgical History  Procedure Laterality Date  . Fracture surgery      left acetabulum  . Ileostomy    . Lung tube    . Ankle surgery Bilateral   . Tracheostomy    . Peg tube placement       PHYSICAL EXAM:  BP 115/68 mmHg  Pulse 70  Temp(Src) 98.5 F (36.9 C) (Oral)  Resp 16  Ht 6' (1.829 m)  Wt 95.618 kg (210 lb 12.8 oz)  BMI 28.58 kg/m2  SpO2 89%  Wt Readings from Last 3 Encounters:  02/03/15 95.618 kg (210 lb 12.8 oz)  11/09/14 86.183 kg (190 lb)  02/04/12 74.844 kg (165 lb)           BP Readings from Last 3 Encounters:  02/03/15 115/68  11/09/14 102/58  02/04/12 131/68    Constitutional: NAD Neck: supple, no thyromegaly Respiratory: CTA, no rales or wheezes Cardiovascular: RRR, no murmur, no gallop Abdomen: soft, good BS, nontender Extremities: no edema Neuro: alert and oriented, no focal motor or sensory deficits  ASSESSMENT/PLAN:  Labs and imaging studies were reviewed  Aspiration pneumonia/hypoxia-fever and symptoms improving since switched to vancomycin/Zosyn, chest x-ray shows persistent left lower lobe infiltrate  Closed head injury/functional quadriplegia-stable  moderate Malnutrition-using G-tube, working with feeding boluses  Skilled nursing placement versus home, discharge Monday

## 2015-02-03 NOTE — Care Management (Signed)
Awaiting approval/denial form [facility] inpatient rehab.

## 2015-02-03 NOTE — Progress Notes (Signed)
Nutrition Follow-up     INTERVENTION:   EN: continue with bolus feedings as scheduled between meals. Continue with free water flushes as well. Bolus feedings should not be held unless residual >500 mL and/or pt with abdominal pain, distention, vomitting or suspected aspiration. Noted hypernatremia, encourage importance of daily free water flushes to meet hydration needs.  At discharge, recommend that pt continue TwoCal boluses as needed. Recommend that it pt does not eat a meal or if pt only eats bites at a give meal period, recommend administering 1 to 1.5 cans of TwoCal. If pt unable to eat for entire 24 hour period, may give total of 4-5 cans of TwoCal per day (not to exceed 5 cans).  Medical Food Supplement: continue Magic Cup TID with meals, can be ordered at home via [facility] Coordination of Care:discussed constipation with [name] RN; pt on colace, dulcolax suppository. Pt would likely benefit from further bowel regimen at this time  NUTRITION DIAGNOSIS:  Inadequate oral intake related to inability to eat as evidenced by NPO status.   GOAL:  Patient will meet greater than or equal to 90% of their needs    MONITOR:   (Energy Intake, EN, Digestive System, Electrolyte/Renal Profile, Glucose Profile)  REASON FOR ASSESSMENT:  Consult Assessment of nutrition requirement/status  ASSESSMENT:  Pt sleeping on visit today, no family at bedside. Awaiting discharge poc  Diet Order: Dysphagia II, Nectar Thick  Current Nutrition: Per calorie count, sporadic intake, although not all meals documented in calorie count. Per documentation, pt ate 50% of chicken and zucchini, 75% of mac n cheese and 50% of magic cup at dinner last night. No documentation of lunch or breakfast. On 7/7 and 7/6, pt did not eat breakfast, too sleepy. Ate 25% of lunch on 7/6. No other meals recorded. Per Evangeline Gula, pt only took bites at breakfast and lunch today.   EN: spoke with Evangeline Gula; pt tolerated AM  and afternoon bolus feedings of Jevity 1.5; also received free water flushes as well. Noted per MAR, pt did not receive bolus feeding or water flush x 2 last night due to residuals but no documentation of residuals in chart. Noted on 7/6, pt/family refused 2 bolus feedings and 1 free water bolus.    Gastrointestinal Profile: no N/V, no signs of TF intolerance, residuals no documented Last BM: no BM documented since 7/3   Medications: colace, dulcolax suppository  Electrolyte/Renal Profile and Glucose Profile:   Recent Labs Lab 01/31/15 0349 02/02/15 0652 02/03/15 0514  NA 144 144 147*  K NOT VALID 3.6 3.9  CL 113* 109 112*  CO2 BUN CREATININE 0.58* 0.64 0.70  CALCIUM 8.1* 8.6* 8.6*  GLUCOSE 104* 99 103*   Protein Profile:  Recent Labs Lab 01/29/15 1326  ALBUMIN 3.5     Weight Trend since Admission: Filed Weights   02/01/15 0500 02/02/15 0100 02/03/15 0500  Weight: 214 lb 8 oz (97.297 kg) 214 lb 6.4 oz (97.251 kg) 210 lb 12.8 oz (95.618 kg)    Height:  Ht Readings from Last 1 Encounters:  01/29/15 6' (1.829 m)    Weight:  Wt Readings from Last 1 Encounters:  02/03/15 210 lb 12.8 oz (95.618 kg)    Ideal Body Weight:     Wt Readings from Last 10 Encounters:  02/03/15 210 lb 12.8 oz (95.618 kg)  11/09/14 190 lb (86.183 kg)  02/04/12 165 lb (74.844 kg)  05/23/11 168 lb (76.204 kg)  BMI:  Body mass index is 28.58 kg/(m^2).  Skin:  Reviewed, no issues  Diet Order:  DIET DYS 2 Room service appropriate?: Yes; Fluid consistency:: Honey Thick  MODERATE Care Level  Copley HospitalCate Amanee Iacovelli MS, RD, LDN [phone] Pager
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
## 2015-02-04 LAB — BASIC METABOLIC PANEL
ANION GAP: 6 (ref 5–15)
BUN: 13 mg/dL (ref 6–20)
CALCIUM: 8.4 mg/dL — AB (ref 8.9–10.3)
CO2: 31 mmol/L (ref 22–32)
CREATININE: 0.61 mg/dL (ref 0.61–1.24)
Chloride: 106 mmol/L (ref 101–111)
GFR calc Af Amer: >60 mL/min (ref >60)
Glucose, Bld: 96 mg/dL (ref 65–99)
Potassium: 3.6 mmol/L (ref 3.5–5.1)
SODIUM: 143 mmol/L (ref 135–145)

## 2015-02-04 LAB — GLUCOSE, CAPILLARY
GLUCOSE-CAPILLARY: 98 mg/dL (ref 65–99)
Glucose-Capillary: 100 mg/dL — ABNORMAL HIGH (ref 65–99)
Glucose-Capillary: 94 mg/dL (ref 65–99)
Glucose-Capillary: 104 mg/dL — ABNORMAL HIGH (ref 65–99)

## 2015-02-04 MED ORDER — PANTOPRAZOLE SODIUM 40 MG PO PACK
40.0000 mg | PACK | Freq: Two times a day (BID) | ORAL | Status: DC
  Administered 2015-02-04 – 2015-02-14 (×19): 40 mg
  Filled 2015-02-04 (×26): qty 20

## 2015-02-04 MED ORDER — JEVITY 1.5 CAL/FIBER PO LIQD
237.0000 mL | Freq: Three times a day (TID) | ORAL | Status: DC
  Administered 2015-02-04 – 2015-02-07 (×8): 237 mL

## 2015-02-04 NOTE — Progress Notes (Signed)
rn placed patient on bipap around 1am. Home unit

## 2015-02-04 NOTE — Progress Notes (Signed)
Attempts were made to place BIPAP on pt. as he fell asleep but he would awaken and pull it off face. BIPAP was finally placed after Pt. was sleeping soundly and it stayed on for approx. 2 hrs. On assessment  around 03:15 am Pt. had pulled the mask off from the tubing attaching the machine.  Was not able to replace machine.

## 2015-02-04 NOTE — Progress Notes (Signed)
[name] [name] is a 29 y.o. male patient. 1. HCAP (healthcare-associated pneumonia)   2. Acute respiratory failure   3. PEG (percutaneous endoscopic gastrostomy) adjustment/replacement/removal   4. Pneumonia    Past Medical History  Diagnosis Date  . Hepatitis C   . Scoliosis   . 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 Subarachnoid hemorrhage   . Pulmonary emboli   . Hepatitis C   . Depression    Current Facility-Administered Medications  Medication Dose Route Frequency Provider Last Rate Last Dose  . acetaminophen (TYLENOL) tablet 650 mg  650 mg Oral Q6H PRN [name], MD       Or  . acetaminophen (TYLENOL) suppository 650 mg  650 mg Rectal Q6H PRN [name], MD      . albuterol (PROVENTIL) (2.5 MG/3ML) 0.083% nebulizer solution 3 mL  3 mL Inhalation Q4H PRN [name], MD      . antiseptic oral rinse (CPC / CETYLPYRIDINIUM CHLORIDE 0.05%) solution 7 mL  7 mL Mouth Rinse q12n4p [name], MD   7 mL at 02/03/15 1600  . apixaban (ELIQUIS) tablet 5 mg  5 mg Oral BID [name], MD   5 mg at 02/04/15 0958  . baclofen (LIORESAL) tablet 20 mg  20 mg Oral TID [name], MD   20 mg at 02/04/15 0958  . bisacodyl (DULCOLAX) suppository 10 mg  10 mg Rectal QHS [name], MD   10 mg at 02/03/15 2210  . chlorhexidine (PERIDEX) 0.12 % solution 15 mL  15 mL Mouth Rinse BID [name], MD   15 mL at 02/03/15 2229  . docusate sodium (COLACE) capsule 100 mg  100 mg Oral BID [name], MD   100 mg at 02/04/15 0959  . feeding supplement (JEVITY 1.5 CAL/FIBER) liquid 237 mL  237 mL Per Tube TID BM [name], MD      . free water 300 mL  300 mL Per Tube 4 times per day [name], MD   300 mL at 02/04/15 0643  . gabapentin (NEURONTIN) capsule 600 mg  600 mg Oral TID [name], MD   600 mg at 02/04/15 0958  . indomethacin (INDOCIN SR) capsule 75 mg  75 mg Oral Daily [name], MD   75 mg at 02/04/15 0959  . lacosamide (VIMPAT) tablet 200 mg   200 mg Oral BID [name], MD   200 mg at 02/04/15 0958  . multivitamin with minerals tablet 1 tablet  1 tablet Oral Daily [name], MD   1 tablet at 02/04/15 0958  . mupirocin ointment (BACTROBAN) 2 % 1 application  1 application Nasal BID [name], MD   1 application at 02/04/15 [phone]  . nortriptyline (PAMELOR) capsule 100 mg  100 mg Oral QHS [name], MD   100 mg at 02/03/15 2239  . OLANZapine (ZYPREXA) tablet 15 mg  15 mg Oral QHS [name], MD   15 mg at 02/03/15 2228  . ondansetron (ZOFRAN) tablet 4 mg  4 mg Oral Q6H PRN [name], MD       Or  . ondansetron (ZOFRAN) injection 4 mg  4 mg Intravenous Q6H PRN [name], MD      . pantoprazole (PROTONIX) injection 40 mg  40 mg Intravenous Q12H [name], MD   40 mg at 02/04/15 0959  . piperacillin-tazobactam (ZOSYN) IVPB 3.375 g  3.375 g Intravenous 3 times per day [name], MD   3.375 g at 02/04/15 0640  .  pregabalin (LYRICA) capsule 75 mg  75 mg Oral TID Auburn BilberryShreyang Patel, MD   75 mg at 02/04/15 0958  . propranolol (INDERAL) 20 MG/5ML solution 20 mg  20 mg Per Tube TID Danella PentonMark F Miller, MD   20 mg at 02/04/15 0957  . risperiDONE (RISPERDAL) tablet 1 mg  1 mg Oral BID Auburn BilberryShreyang Patel, MD   1 mg at 02/04/15 0958  . vancomycin (VANCOCIN) IVPB 1000 mg/200 mL premix  1,000 mg Intravenous Q12H Danella PentonMark F Miller, MD   1,000 mg at 02/04/15 0121   Allergies  Allergen Reactions  . Ambien [Zolpidem] Other (See Comments)    Patient start hallucinating.   . Ativan [Lorazepam] Itching and Other (See Comments)    Patient gets hot and sweaty. Hallucinations   . Depakote Er [Divalproex Sodium Er] Itching and Other (See Comments)    Patient gets hot and sweaty.  . Dilaudid [Hydromorphone] Hives and Other (See Comments)    Patient gets hot and sweaty.  Marland Kitchen. Keppra [Levetiracetam] Itching and Other (See Comments)    Patient gets hot and sweaty.   Active Problems:   Acute respiratory failure  Blood pressure 110/70,  pulse 69, temperature 98.3 F (36.8 C), temperature source Axillary, resp. rate 17, height 6' (1.829 m), weight 94.575 kg (208 lb 8 oz), SpO2 92 %.  Subjective  Patient admitted with pneumonia, cough, congestion, fever improving. Continues to be borderline hypoxic, requiring O2. Hx from family with him, no real changes noted ROS; Could not do as not verbal Objective  Nad, non verbal Chest; clear  Heart; RRR no mrg Abd; nontender, nl bs Ext; No cce Assessment & Plan  Aspiration pneumonia/hypoxia-fever and symptoms improving since switched to vancomycin/Zosyn  Closed head injury/functional quadriplegia-stable moderate Malnutrition-using G-tube, working with feeding boluses  Skilled nursing placement versus home, discharge Monday   Signed [name],[name] 02/04/2015

## 2015-02-04 NOTE — Progress Notes (Signed)
Key Points: Use following P&T approved IV to PO antibiotic change policy.   CONCERNING: IV to Oral Route Change Policy  RECOMMENDATION: This patient is receiving pantoprazole by the intravenous route.  Based on criteria approved by the Pharmacy and Therapeutics Committee, the intravenous medication(s) is/are being converted to the equivalent oral dose form(s).   DESCRIPTION: These criteria include:  The patient is eating (either orally or via tube) and/or has been taking other orally administered medications for a least 24 hours  The patient has no evidence of active gastrointestinal bleeding or impaired GI absorption (gastrectomy, short bowel, patient on TNA or NPO).  If you have questions about this conversion, please contact the Pharmacy Department  []   [phone]( [phone] )  [facility] [x]   [phone]( [phone] )  [facility] []   [phone]( [phone] )  [facility] []   [phone]( [phone] )  [facility] []   [phone]( [phone] )  [facility]   HuntScarpena,Rilan Eiland G, [facility] CampusRPH 02/04/2015 12:27 PM

## 2015-02-04 NOTE — Progress Notes (Addendum)
Spoke with Dr. Dareen PianoAnderson per Mom's request - patient seems more sedated recently and has had recent medication changes (over the last two weeks).  Mom is requesting that the risperidone be d/c'd to see if this is causing oversedation.  Per Dr. Dareen PianoAnderson risperidone discontinued.

## 2015-02-05 LAB — GLUCOSE, CAPILLARY
GLUCOSE-CAPILLARY: 91 mg/dL (ref 65–99)
GLUCOSE-CAPILLARY: 94 mg/dL (ref 65–99)
Glucose-Capillary: 106 mg/dL — ABNORMAL HIGH (ref 65–99)
Glucose-Capillary: 112 mg/dL — ABNORMAL HIGH (ref 65–99)
Glucose-Capillary: 118 mg/dL — ABNORMAL HIGH (ref 65–99)

## 2015-02-05 MED ORDER — NORTRIPTYLINE HCL 50 MG PO CAPS: 100.0000 mg | ORAL_CAPSULE | Freq: Every day | ORAL | Status: DC

## 2015-02-05 MED ORDER — JEVITY 1.5 CAL/FIBER PO LIQD: 237.0000 mL | Freq: Three times a day (TID) | ORAL | Status: DC

## 2015-02-05 MED ORDER — FREE WATER: 300.0000 mL | Freq: Four times a day (QID) | Status: DC

## 2015-02-05 NOTE — Progress Notes (Signed)
[name] [name] is a 29 y.o. male patient. 1. HCAP (healthcare-associated pneumonia)   2. Acute respiratory failure   3. PEG (percutaneous endoscopic gastrostomy) adjustment/replacement/removal   4. Pneumonia    Past Medical History  Diagnosis Date  . Hepatitis C   . Scoliosis   . 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 Subarachnoid hemorrhage   . Pulmonary emboli   . Hepatitis C   . Depression    Current Facility-Administered Medications  Medication Dose Route Frequency Provider Last Rate Last Dose  . acetaminophen (TYLENOL) tablet 650 mg  650 mg Oral Q6H PRN [name], MD       Or  . acetaminophen (TYLENOL) suppository 650 mg  650 mg Rectal Q6H PRN [name], MD      . albuterol (PROVENTIL) (2.5 MG/3ML) 0.083% nebulizer solution 3 mL  3 mL Inhalation Q4H PRN [name], MD      . antiseptic oral rinse (CPC / CETYLPYRIDINIUM CHLORIDE 0.05%) solution 7 mL  7 mL Mouth Rinse q12n4p [name], MD   7 mL at 02/04/15 1600  . apixaban (ELIQUIS) tablet 5 mg  5 mg Oral BID [name], MD   5 mg at 02/04/15 2058  . baclofen (LIORESAL) tablet 20 mg  20 mg Oral TID [name], MD   20 mg at 02/04/15 2058  . bisacodyl (DULCOLAX) suppository 10 mg  10 mg Rectal QHS [name], MD   10 mg at 02/04/15 2058  . chlorhexidine (PERIDEX) 0.12 % solution 15 mL  15 mL Mouth Rinse BID [name], MD   15 mL at 02/05/15 0053  . docusate sodium (COLACE) capsule 100 mg  100 mg Oral BID [name], MD   100 mg at 02/04/15 2058  . feeding supplement (JEVITY 1.5 CAL/FIBER) liquid 237 mL  237 mL Per Tube TID BM [name], MD   237 mL at 02/05/15 1039  . free water 300 mL  300 mL Per Tube 4 times per day [name], MD   300 mL at 02/05/15 0610  . gabapentin (NEURONTIN) capsule 600 mg  600 mg Oral TID [name], MD   600 mg at 02/04/15 2058  . indomethacin (INDOCIN SR) capsule 75 mg  75 mg Oral Daily [name], MD   75 mg at 02/04/15 0959  . lacosamide  (VIMPAT) tablet 200 mg  200 mg Oral BID [name], MD   200 mg at 02/04/15 2057  . multivitamin with minerals tablet 1 tablet  1 tablet Oral Daily [name], MD   1 tablet at 02/04/15 0958  . nortriptyline (PAMELOR) capsule 100 mg  100 mg Oral QHS [name], MD   100 mg at 02/04/15 2057  . OLANZapine (ZYPREXA) tablet 15 mg  15 mg Oral QHS [name], MD   15 mg at 02/04/15 2056  . ondansetron (ZOFRAN) tablet 4 mg  4 mg Oral Q6H PRN [name], MD       Or  . ondansetron (ZOFRAN) injection 4 mg  4 mg Intravenous Q6H PRN [name], MD      . pantoprazole sodium (PROTONIX) 40 mg/20 mL oral suspension 40 mg  40 mg Per Tube BID [name], MD   40 mg at 02/05/15 1039  . piperacillin-tazobactam (ZOSYN) IVPB 3.375 g  3.375 g Intravenous 3 times per day [name], MD   3.375 g at 02/05/15 0853  . propranolol (INDERAL) 20 MG/5ML solution 20 mg  20 mg Per Tube TID Loraine Leriche  Sherlene ShamsF Miller, MD   20 mg at 02/05/15 1039  . vancomycin (VANCOCIN) IVPB 1000 mg/200 mL premix  1,000 mg Intravenous Q12H Danella PentonMark F Miller, MD   1,000 mg at 02/05/15 0138   Allergies  Allergen Reactions  . Ambien [Zolpidem] Other (See Comments)    Patient start hallucinating.   . Ativan [Lorazepam] Itching and Other (See Comments)    Patient gets hot and sweaty. Hallucinations   . Depakote Er [Divalproex Sodium Er] Itching and Other (See Comments)    Patient gets hot and sweaty.  . Dilaudid [Hydromorphone] Hives and Other (See Comments)    Patient gets hot and sweaty.  Marland Kitchen. Keppra [Levetiracetam] Itching and Other (See Comments)    Patient gets hot and sweaty.   Active Problems:   Acute respiratory failure  Blood pressure 107/56, pulse 66, temperature 98.2 F (36.8 C), temperature source Axillary, resp. rate 18, height 6' (1.829 m), weight 93.7 kg (206 lb 9.1 oz), SpO2 91 %.  Subjective  Patient admitted with pneumonia, cough, congestion, fever improving. Continues to be borderline hypoxic, requiring O2.  Hx from family with him, no real changes noted. On lyrica here and usually doesn't take, mom wished him to be off risperdal as sedate and on zyprexa, stopped that yesterday via word from nursing that this was a mistake too but he is ok off it for now so holding still ROS; Could not do as not verbal Objective  Nad, non verbal Chest; clear  Heart; RRR no mrg Abd; nontender, nl bs Ext; No cce Assessment & Plan Aspiration pneumonia/hypoxia-fever and symptoms improving since switched to vancomycin/Zosyn  Closed head injury/functional quadriplegia-stable, off rispderal and lyrica and needs monitering of cns status with these changes Skilled nursing placement versus home, discharge Monday  Signed [name],Lucciana Head W. 02/05/2015
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
## 2015-02-06 ENCOUNTER — Inpatient Hospital Stay: Payer: BLUE CROSS/BLUE SHIELD

## 2015-02-06 LAB — BLOOD GAS, ARTERIAL
ALLENS TEST (PASS/FAIL): POSITIVE — AB
Acid-Base Excess: 6.7 mmol/L — ABNORMAL HIGH (ref 0.0–3.0)
Bicarbonate: 33.3 mEq/L — ABNORMAL HIGH (ref 21.0–28.0)
FIO2: 0.21 %
O2 Saturation: 89.9 %
PATIENT TEMPERATURE: 37
pCO2 arterial: 55 mmHg — ABNORMAL HIGH (ref 32.0–48.0)
pH, Arterial: 7.39 (ref 7.350–7.450)
pO2, Arterial: 59 mmHg — ABNORMAL LOW (ref 83.0–108.0)

## 2015-02-06 LAB — CBC WITH DIFFERENTIAL/PLATELET
BASOS PCT: 1 %
Basophils Absolute: 0 10*3/uL (ref 0–0.1)
Eosinophils Absolute: 0.1 10*3/uL (ref 0–0.7)
Eosinophils Relative: 1 %
HEMATOCRIT: 34.5 % — AB (ref 40.0–52.0)
Hemoglobin: 11.1 g/dL — ABNORMAL LOW (ref 13.0–18.0)
Lymphocytes Relative: 33 %
Lymphs Abs: 2.1 10*3/uL (ref 1.0–3.6)
MCH: 26.2 pg (ref 26.0–34.0)
MCHC: 32.3 g/dL (ref 32.0–36.0)
MCV: 81.3 fL (ref 80.0–100.0)
MONO ABS: 0.7 10*3/uL (ref 0.2–1.0)
MONOS PCT: 11 %
Neutro Abs: 3.5 10*3/uL (ref 1.4–6.5)
Neutrophils Relative %: 54 %
Platelets: 246 10*3/uL (ref 150–440)
RBC: 4.24 MIL/uL — AB (ref 4.40–5.90)
RDW: 16.4 % — AB (ref 11.5–14.5)
WBC: 6.4 10*3/uL (ref 3.8–10.6)

## 2015-02-06 LAB — BASIC METABOLIC PANEL
ANION GAP: 6 (ref 5–15)
BUN: 16 mg/dL (ref 6–20)
CO2: 30 mmol/L (ref 22–32)
Calcium: 8.6 mg/dL — ABNORMAL LOW (ref 8.9–10.3)
Chloride: 106 mmol/L (ref 101–111)
Creatinine, Ser: 0.69 mg/dL (ref 0.61–1.24)
Glucose, Bld: 112 mg/dL — ABNORMAL HIGH (ref 65–99)
POTASSIUM: 3.9 mmol/L (ref 3.5–5.1)
Sodium: 142 mmol/L (ref 135–145)

## 2015-02-06 LAB — GLUCOSE, CAPILLARY
GLUCOSE-CAPILLARY: 121 mg/dL — AB (ref 65–99)
Glucose-Capillary: 110 mg/dL — ABNORMAL HIGH (ref 65–99)
Glucose-Capillary: 87 mg/dL (ref 65–99)

## 2015-02-06 MED ORDER — FUROSEMIDE 10 MG/ML IJ SOLN
20.0000 mg | Freq: Once | INTRAMUSCULAR | Status: AC
  Administered 2015-02-06: 20 mg via INTRAVENOUS
  Filled 2015-02-06: qty 2

## 2015-02-06 MED ORDER — SODIUM CHLORIDE 0.45 % IV SOLN
INTRAVENOUS | Status: DC
  Administered 2015-02-06: 13:00:00 via INTRAVENOUS

## 2015-02-06 NOTE — Progress Notes (Signed)
Patient ID: [name] [name], male   DOB: 08/24/1985, 29 y.o.   MRN: [id number] Patient ID: [name] [name], male   DOB: 10/25/1985, 29 y.o.   MRN: [id number] [name] [name] is a 29 y.o. male   SUBJECTIVE:  Patient admitted with pneumonia, cough, congestion, fever resolved. Continues to be mildly hypoxic, requiring O2. Planned for discharge but remains lethargic this am.  ______________________________________________________________________  ROS: Review of systems is unremarkable for any active cardiac,respiratory, GI, GU, hematologic, neurologic or psychiatric systems, 10 systems reviewed.  Marland Kitchen. antiseptic oral rinse  7 mL Mouth Rinse q12n4p  . apixaban  5 mg Oral BID  . baclofen  20 mg Oral TID  . bisacodyl  10 mg Rectal QHS  . chlorhexidine  15 mL Mouth Rinse BID  . docusate sodium  100 mg Oral BID  . feeding supplement (JEVITY 1.5 CAL/FIBER)  237 mL Per Tube TID BM  . free water  300 mL Per Tube 4 times per day  . indomethacin  75 mg Oral Daily  . lacosamide  200 mg Oral BID  . multivitamin with minerals  1 tablet Oral Daily  . OLANZapine  15 mg Oral QHS  . pantoprazole sodium  40 mg Per Tube BID  . piperacillin-tazobactam (ZOSYN)  IV  3.375 g Intravenous 3 times per day  . propranolol  20 mg Per Tube TID  . vancomycin  1,000 mg Intravenous Q12H   acetaminophen **OR** acetaminophen, albuterol, ondansetron **OR** ondansetron (ZOFRAN) IV   Past Medical History  Diagnosis Date  . Hepatitis C   . Scoliosis   . 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 Subarachnoid hemorrhage   . Pulmonary emboli   . Hepatitis C   . Depression     Past Surgical History  Procedure Laterality Date  . Fracture surgery      left acetabulum  . Ileostomy    . Lung tube    . Ankle surgery Bilateral   . Tracheostomy    . Peg tube placement      PHYSICAL EXAM:  BP 107/66 mmHg  Pulse 77  Temp(Src) 97.7 F (36.5 C) (Axillary)  Resp 16  Ht 6' (1.829 m)  Wt 93.35 kg (205 lb 12.8 oz)  BMI 27.91 kg/m2   SpO2 94%  Wt Readings from Last 3 Encounters:  02/06/15 93.35 kg (205 lb 12.8 oz)  11/09/14 86.183 kg (190 lb)  02/04/12 74.844 kg (165 lb)           BP Readings from Last 3 Encounters:  02/06/15 107/66  11/09/14 102/58  02/04/12 131/68    Constitutional: NAD Neck: supple, no thyromegaly Respiratory: CTA, no rales or wheezes Cardiovascular: RRR, no murmur, no gallop Abdomen: soft, good BS, nontender Extremities: no edema Neuro: somnolent  ASSESSMENT/PLAN:  Labs and imaging studies were reviewed  Aspiration pneumonia/hypoxia-fever and symptoms improving since switched to vancomycin/Zosyn, reck chest x-ray today, stop abx in am Somnolence- off neurontin and nortryptiline, ABG shows hypoventilation, no hypercarbia, IVF Closed head injury/functional quadriplegia-stable  moderate Malnutrition-using G-tube, working with feeding boluses  Hold discharge

## 2015-02-06 NOTE — Progress Notes (Addendum)
PT Cancellation Note  Patient Details Name: Fayrene [name] Codd MRN: [id number] DOB: 11/13/1985   Cancelled Treatment:    Reason Eval/Treat Not Completed: Patient declined, no reason specified.  Lethargic and recommend CIR evaluation as pt has clearly declined and lethargy is making him more likely to be a skin breakdown risk.  See flowsheet for details as did not flow onto this note.  FLOWSHEET INFORMATION Pt is lethargic and unable to determine his abiltiy to assist with bed mob and needs more care than home care for present.  He needs to be inpt for therapy to recover his alertness and make sure he can assist with bed mob and to get him to PLOF.  If CIR is refused would recommend SNF to increase active participation in mobility before home. He is at risk of skin change which could be medically dangerous for him with his co-morbidities.    Ivar DrapeStout, Eluterio Seymour E 02/06/2015, 10:53 AM   Samul Dadauth Jahi Roza, PT MS Acute Rehab Dept. Number: [facility] [id number] and [facility] [phone]

## 2015-02-06 NOTE — Progress Notes (Signed)
Pt lethargic throughout morning. Prior nursing staff stated that pt was hard to arouse in AM over the weekend at times. Vital signs stable; Dr. Hyacinth MeekerMiller notified of pt being lethargic. Orders received for a STAT ABG. Primary nurse to continue to monitor closely.
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
## 2015-02-06 NOTE — Progress Notes (Signed)
Dr. Hyacinth MeekerMiller notified of pt's ABG results. Dr. Hyacinth MeekerMiller to suspend discharge for today.Dr. Hyacinth MeekerMiller to reassess medications. Primary nurse to continue to monitor.

## 2015-02-06 NOTE — Care Management (Signed)
Patient is still lethargic and unable to stay awake.  Suspect medications were discontinued but patient still very sleepy. Spoke with mother Jeronimo NormaJeanie who had concerns about patient discharge. Insurance approval still pending for inpatient rehab at [facility]. Spoke with Dr Hyacinth MeekerMiller who will suspend discharge for today pending further tests.

## 2015-02-06 NOTE — Progress Notes (Signed)
Pt lungs sounding coarse throughout day. Pt still lethargic unable to cough. Pt sats 88-95% room air. Orders received for 20 mg IV lasix once; 2 liters O2

## 2015-02-06 NOTE — Discharge Summary (Addendum)
Fayrene [name] Guzzetta, is a 29 y.o. male  DOB 10/22/1985  MRN [id number].  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 Hepatitis C   . Scoliosis   . 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 Subarachnoid hemorrhage   . Pulmonary emboli   . Hepatitis C   . Depression     Past Surgical History  Procedure Laterality Date  . Fracture surgery      left acetabulum  . Ileostomy    . Lung tube    . Ankle surgery Bilateral   . Tracheostomy    . Peg tube placement         History of present illness and  Hospital Course:     Kindly see H&P for history of present illness and admission details, please review complete Labs, Consult reports and Test reports for all details in brief  HPI  from the history and physical done on the day of admission    Hospital Course   Patient was admitted and thought to have aspiration pneumonia. He was initially treated with IV Unasyn however he continued to be febrile with persistent infiltrate in his antibiotics were changed to IV Zosyn/vancomycin with significant improvement and resolution of the symptoms. Due to oversedation Risperdal was discontinued as well as nortriptyline. He had significant sedation and confusion. His family thought he was having seizure problems and neurology was consulted. 2 different antiepileptics were tried with no success. His mental confusion and  sedation really resolved with discontinuation of the Risperdal and nortriptyline. No clear seizure activity was witnessed by the hospital staff however the family thought his staring spells were consistent with his seizures. EEG showed no seizure activity but did show epileptiform discharges from the left parietal area. Family requested skilled nursing placement however the patient did not qualify for skilled placement nor transfer to [facility]. [facility] declined transfer as they were not sure patient was having seizures and he was clinically stable. The patient is currently alert, back to baseline, normal mental status for the last 48 hours. He will go to [facility] to get his baclofen pump filled and his mom will pursue further the issue of seizures and possible chronic aspiration. Follow-up chest x-ray showed resolution of pneumonia and patient was afebrile with normal oxygenation.   Discharge Condition: Stable   Follow UP  Dr. Hyacinth MeekerMiller 1 week   Discharge Instructions  and  Discharge Medications   Albuterol SVN 3 cc twice a day when necessary Baclofen 20 g 3 times a day Cranberry 1000 mg tabs daily Colace 100 mg twice a day Eliquis 5 mg  twice a day Jevity feeding supplement 237 cc into G-tube 3 times a day between meals Free water solution, 300 cc every 6 hours Gabapentin 600 mg 3 times a day Indomethacin 75 mg daily Vimpat 200 mg twice a day Multivitamin daily Zyprexa 15 mg at bedtime Septra DS twice a day 1 week Protonix 40 g twice a day Propranolol ER 60 mg daily Actonel 35 mg every week    Today   Subjective:   Conlin Brahm today is stable and back to baseline.   Objective:   Blood pressure 105/65, pulse 64, temperature 98.2 F (36.8 C), temperature source Oral, resp. rate 16, height 6' (1.829 m), weight 93.35 kg (205 lb 12.8 oz), SpO2 94 %.   Exam Awake Alert, Oriented x 3, No new F.N deficits, Normal affect [place].AT,PERRAL Supple Neck,No JVD, No  cervical lymphadenopathy appriciated.  Symmetrical Chest wall movement, Good air movement bilaterally, CTAB RRR,No Gallops,Rubs or new Murmurs, Abd Soft, Non tender, No rebound. No Cyanosis, Clubbing or edema, No new Rash or bruise  Total Time in preparing paper work, data evaluation and todays exam - 35 minutes  MILLER,MARK F. M.D on 02/14/2015 at 7:41 AM

## 2015-02-07 ENCOUNTER — Inpatient Hospital Stay: Payer: BLUE CROSS/BLUE SHIELD

## 2015-02-07 DIAGNOSIS — R400 Somnolence: Secondary | ICD-10-CM

## 2015-02-07 DIAGNOSIS — G9341 Metabolic encephalopathy: Secondary | ICD-10-CM | POA: Diagnosis not present

## 2015-02-07 DIAGNOSIS — J690 Pneumonitis due to inhalation of food and vomit: Principal | ICD-10-CM

## 2015-02-07 LAB — URINALYSIS COMPLETE WITH MICROSCOPIC (ARMC ONLY)
Bacteria, UA: NONE SEEN
Bilirubin Urine: NEGATIVE
Glucose, UA: NEGATIVE mg/dL
HGB URINE DIPSTICK: NEGATIVE
Ketones, ur: NEGATIVE mg/dL
Leukocytes, UA: NEGATIVE
NITRITE: NEGATIVE
Protein, ur: NEGATIVE mg/dL
Specific Gravity, Urine: 1.004 — ABNORMAL LOW (ref 1.005–1.030)
Squamous Epithelial / LPF: NONE SEEN
pH: 7 (ref 5.0–8.0)

## 2015-02-07 LAB — CBC WITH DIFFERENTIAL/PLATELET
BASOS PCT: 1 %
Basophils Absolute: 0 10*3/uL (ref 0–0.1)
EOS ABS: 0.1 10*3/uL (ref 0–0.7)
EOS PCT: 1 %
HEMATOCRIT: 34.1 % — AB (ref 40.0–52.0)
Hemoglobin: 11.3 g/dL — ABNORMAL LOW (ref 13.0–18.0)
LYMPHS PCT: 24 %
Lymphs Abs: 1.9 10*3/uL (ref 1.0–3.6)
MCH: 26.9 pg (ref 26.0–34.0)
MCHC: 33 g/dL (ref 32.0–36.0)
MCV: 81.5 fL (ref 80.0–100.0)
MONO ABS: 0.8 10*3/uL (ref 0.2–1.0)
MONOS PCT: 10 %
NEUTROS ABS: 5.2 10*3/uL (ref 1.4–6.5)
Neutrophils Relative %: 64 %
Platelets: 225 10*3/uL (ref 150–440)
RBC: 4.19 MIL/uL — ABNORMAL LOW (ref 4.40–5.90)
RDW: 16 % — AB (ref 11.5–14.5)
WBC: 8.1 10*3/uL (ref 3.8–10.6)

## 2015-02-07 LAB — GLUCOSE, CAPILLARY
GLUCOSE-CAPILLARY: 96 mg/dL (ref 65–99)
GLUCOSE-CAPILLARY: 107 mg/dL — AB (ref 65–99)
Glucose-Capillary: 88 mg/dL (ref 65–99)
Glucose-Capillary: 80 mg/dL (ref 65–99)
Glucose-Capillary: 132 mg/dL — ABNORMAL HIGH (ref 65–99)
Glucose-Capillary: 109 mg/dL — ABNORMAL HIGH (ref 65–99)

## 2015-02-07 LAB — COMPREHENSIVE METABOLIC PANEL
ALT: 47 U/L (ref 17–63)
ANION GAP: 8 (ref 5–15)
AST: 27 U/L (ref 15–41)
Albumin: 3 g/dL — ABNORMAL LOW (ref 3.5–5.0)
Alkaline Phosphatase: 56 U/L (ref 38–126)
BUN: 16 mg/dL (ref 6–20)
CALCIUM: 8.2 mg/dL — AB (ref 8.9–10.3)
CHLORIDE: 102 mmol/L (ref 101–111)
CO2: 31 mmol/L (ref 22–32)
CREATININE: 0.63 mg/dL (ref 0.61–1.24)
GFR calc Af Amer: >60 mL/min (ref >60)
GFR calc non Af Amer: >60 mL/min (ref >60)
Glucose, Bld: 89 mg/dL (ref 65–99)
Potassium: 3.5 mmol/L (ref 3.5–5.1)
Sodium: 141 mmol/L (ref 135–145)
Total Bilirubin: 0.5 mg/dL (ref 0.3–1.2)
Total Protein: 5.9 g/dL — ABNORMAL LOW (ref 6.5–8.1)

## 2015-02-07 LAB — VANCOMYCIN, TROUGH: VANCOMYCIN TR: 19 ug/mL (ref 10–20)

## 2015-02-07 MED ORDER — DEXTROSE-NACL 5-0.45 % IV SOLN
INTRAVENOUS | Status: DC
  Administered 2015-02-07: 1 mL via INTRAVENOUS
  Administered 2015-02-07: 04:00:00 via INTRAVENOUS

## 2015-02-07 MED ORDER — PROPRANOLOL HCL 20 MG/5ML PO SOLN
20.0000 mg | Freq: Two times a day (BID) | ORAL | Status: DC
  Administered 2015-02-07 (×2): 20 mg
  Filled 2015-02-07 (×2): qty 5

## 2015-02-07 MED ORDER — JEVITY 1.5 CAL/FIBER PO LIQD
237.0000 mL | Freq: Three times a day (TID) | ORAL | Status: DC
  Administered 2015-02-07 – 2015-02-10 (×9): 237 mL

## 2015-02-07 MED ORDER — IPRATROPIUM-ALBUTEROL 0.5-2.5 (3) MG/3ML IN SOLN
3.0000 mL | Freq: Four times a day (QID) | RESPIRATORY_TRACT | Status: DC
  Administered 2015-02-07 – 2015-02-14 (×23): 3 mL via RESPIRATORY_TRACT
  Filled 2015-02-07 (×26): qty 3

## 2015-02-07 NOTE — Progress Notes (Signed)
Patient in no distress. Attempted svn treatment with this patient. Will not keep svn mask on face. bbs are clear and diminished at this time. Remains on 2liter nasal cannula

## 2015-02-07 NOTE — Progress Notes (Signed)
FSBS 80-88; Dr. Judithann SheenSparks (on-call MD) paged; notified per order; new order written; Windy Carinaurner,Rogers Ditter K, RN 3:55 AM February 07, 2015

## 2015-02-07 NOTE — Progress Notes (Signed)
Nutrition Follow-up  DOCUMENTATION CODES:     INTERVENTION:  EN: Continue bolus feeding TID at this time of Jevity 1.5, noted sticky note left to RD regarding changing times of last feeding to 20:00 to 22:00.  Order modified per this request.  May need to consider increasing tube feeding if pt unable to take po intake adequately Coordination of care: Diet order remains active at this time.  RN Viviann SpareSteven aware and nursing will not deliver tray if pt not alert enough to eat.   NUTRITION DIAGNOSIS:  Inadequate oral intake related to inability to eat as evidenced by NPO status being addressed with tube feeding    GOAL:  Patient will meet greater than or equal to 90% of their needs    MONITOR:   (Energy Intake, EN, Digestive System, Electrolyte/Renal Profile, Glucose Profile)  REASON FOR ASSESSMENT:  Consult Assessment of nutrition requirement/status  ASSESSMENT:    Pt with cpap in place at this time, lethargy and increased temperature this am.    Diet Order: Dysphagia II, nectar thick liquids  Current Nutrition: nothing this am due to lethargy. 0-25% noted per I and O sheet, limited documentation of intake  EN: jevity 1.5 1 can TID noted given this am, on 7/11 given times 3 and 7/10 given times 2, noted free water given  Gastrointestinal Profile:miminal residual per RN, Viviann SpareSteven, no signs of GI intolerance to tube feeding Last BM: 7/12   Medications: D5 1/2 NS at 2575ml/hr,lasix, MVI  Electrolyte/Renal Profile and Glucose Profile:   Recent Labs Lab 02/04/15 0728 02/06/15 0517 02/07/15 0657  NA 143 142 141  K 3.6 3.9 3.5  CL 106 106 102  CO2 31 30 31   BUN 13 16 16   CREATININE 0.61 0.69 0.63  CALCIUM 8.4* 8.6* 8.2*  GLUCOSE 96 112* 89   Protein Profile:   Recent Labs Lab 02/07/15 0657  ALBUMIN 3.0*      Weight Trend since Admission: Filed Weights   02/05/15 0500 02/06/15 0535 02/07/15 0511  Weight: 206 lb 9.1 oz (93.7 kg) 205 lb 12.8 oz (93.35 kg) 206  lb 8 oz (93.668 kg)       Diet Order:  DIET DYS 2 Room service appropriate?: Yes; Fluid consistency:: Honey Thick Diet - low sodium heart healthy  Skin:  Reviewed, no issues   Height:  Ht Readings from Last 1 Encounters:  01/29/15 6' (1.829 m)    Weight:  Wt Readings from Last 1 Encounters:  02/07/15 206 lb 8 oz (93.668 kg)      Wt Readings from Last 10 Encounters:  02/07/15 206 lb 8 oz (93.668 kg)  11/09/14 190 lb (86.183 kg)  02/04/12 165 lb (74.844 kg)  05/23/11 168 lb (76.204 kg)    BMI:  Body mass index is 28 kg/(m^2).      HIGH Care Level Saphyra Hutt B. Freida BusmanAllen, RD, LDN [phone] (pager)
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
## 2015-02-07 NOTE — Care Management (Signed)
Notified by Feliberto GottronJason Hinton with [facility] that this patient is open to their services; resumption of home health will be needed if patient returns home.

## 2015-02-07 NOTE — Progress Notes (Addendum)
Pt. flatly refused Bipap overnight; (thumbs down); witnessed by RT; Windy Carinaurner,Lenyx Boody K, RN 0430; February 07, 2015

## 2015-02-07 NOTE — Consult Note (Signed)
CC: lethargy  HPI: [name] [name] is an 29 y.o. 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 Overnight pt had period of hypotension and lethargy.  D/w caregiver at bedside and appears pt's symptoms have improved but not on baseline.    Past Medical History  Diagnosis Date  . Hepatitis C   . Scoliosis   . 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 Subarachnoid hemorrhage   . Pulmonary emboli   . Hepatitis C   . Depression     Past Surgical History  Procedure Laterality Date  . Fracture surgery      left acetabulum  . Ileostomy    . Lung tube    . Ankle surgery Bilateral   . Tracheostomy    . Peg tube placement      Family History  Problem Relation Age of Onset  . Hypertension      Social History:  reports that he quit smoking about 3 years ago. He has never used smokeless tobacco. He reports that he does not drink alcohol or use illicit drugs.  Allergies  Allergen Reactions  . Ambien [Zolpidem] Other (See Comments)    Patient start hallucinating.   . Ativan [Lorazepam] Itching and Other (See Comments)    Patient gets hot and sweaty. Hallucinations   . Depakote Er [Divalproex Sodium Er] Itching and Other (See Comments)    Patient gets hot and sweaty.  . Dilaudid [Hydromorphone] Hives and Other (See Comments)    Patient gets hot and sweaty.  Marland Kitchen. Keppra [Levetiracetam] Itching and Other (See Comments)    Patient gets hot and sweaty.    Medications: I have reviewed the patient's current medications.  ROS: Unable to obtain due to poor verbal output  Physical Examination: Blood pressure 108/62, pulse 70, temperature 98.4 F (36.9 C), temperature source Oral, resp. rate 16, height 6' (1.829 m), weight 93.668 kg (206 lb 8 oz), SpO2 95 %.  Pt is trying to state his name but increased moaning.   Slight movement of RUE  Follows simple commands by closing his eyes and sticking out  his tongue No movement LUE and b/l LE which is chronic.    Laboratory Studies:   Basic Metabolic Panel:  Recent Labs Lab 02/02/15 0652 02/03/15 0514 02/04/15 0728 02/06/15 0517 02/07/15 0657  NA 144 147* 143 142 141  K 3.6 3.9 3.6 3.9 3.5  CL 109 112* 106 106 102  CO2 29 28 31 30 31   GLUCOSE 99 103* 96 112* 89  BUN 12 10 13 16 16   CREATININE 0.64 0.70 0.61 0.69 0.63  CALCIUM 8.6* 8.6* 8.4* 8.6* 8.2*    Liver Function Tests:  Recent Labs Lab 02/07/15 0657  AST 27  ALT 47  ALKPHOS 56  BILITOT 0.5  PROT 5.9*  ALBUMIN 3.0*   No results for input(s): LIPASE, AMYLASE in the last 168 hours. No results for input(s): AMMONIA in the last 168 hours.  CBC:  Recent Labs Lab 02/02/15 0652 02/03/15 0514 02/06/15 0517 02/07/15 0657  WBC 5.2 5.4 6.4 8.1  NEUTROABS 2.9 3.1 3.5 5.2  HGB 11.1* 11.4* 11.1* 11.3*  HCT 34.6* 35.4* 34.5* 34.1*  MCV 81.6 82.1 81.3 81.5  PLT 263 263 246 225    Cardiac Enzymes: No results for input(s): CKTOTAL, CKMB, CKMBINDEX, TROPONINI in the last 168 hours.  BNP: Invalid input(s): POCBNP  CBG:  Recent Labs Lab 02/06/15 2046 02/07/15 0018  02/07/15 0345 02/07/15 0857 02/07/15 1140  GLUCAP 121* 88 80 96 132*    Microbiology: Results for orders placed or performed during the hospital encounter of 01/29/15  Blood culture (routine x 2)     Status: None   Collection Time: 01/29/15  1:10 PM  Result Value Ref Range Status   Specimen Description BLOOD  Final   Special Requests NONE  Final   Culture NO GROWTH 5 DAYS  Final   Report Status 02/03/2015 FINAL  Final  Blood culture (routine x 2)     Status: None   Collection Time: 01/29/15  1:26 PM  Result Value Ref Range Status   Specimen Description BLOOD  Final   Special Requests NONE  Final   Culture NO GROWTH 5 DAYS  Final   Report Status 02/03/2015 FINAL  Final  MRSA PCR Screening     Status: Abnormal   Collection Time: 01/29/15  4:02 PM  Result Value Ref Range Status   MRSA  by PCR POSITIVE (A) NEGATIVE Final    Comment:        The GeneXpert MRSA Assay (FDA approved for NASAL specimens only), is one component of a comprehensive MRSA colonization surveillance program. It is not intended to diagnose MRSA infection nor to guide or monitor treatment for MRSA infections. CRITICAL RESULT CALLED TO, READ BACK BY AND VERIFIED WITH: CHERYL SMITH ON 01/29/15 AT 1750 BY JEF     Coagulation Studies: No results for input(s): LABPROT, INR in the last 72 hours.  Urinalysis: No results for input(s): COLORURINE, LABSPEC, PHURINE, GLUCOSEU, HGBUR, BILIRUBINUR, KETONESUR, PROTEINUR, UROBILINOGEN, NITRITE, LEUKOCYTESUR in the last 168 hours.  Invalid input(s): APPERANCEUR  Lipid Panel:  No results found for: CHOL, TRIG, HDL, CHOLHDL, VLDL, LDLCALC  HgbA1C: No results found for: HGBA1C  Urine Drug Screen:  No results found for: LABOPIA, COCAINSCRNUR, LABBENZ, AMPHETMU, THCU, LABBARB  Alcohol Level: No results for input(s): ETH in the last 168 hours.  Other results: EKG: normal EKG, normal sinus rhythm, unchanged from previous tracings.  Imaging: Dg Chest Port 1 View  02/06/2015   CLINICAL DATA:  Persistent pneumonia  EXAM: PORTABLE CHEST - 1 VIEW  COMPARISON:  February 02, 2015  FINDINGS: There is increase in left base airspace consolidation. Right lung is clear. Heart is mildly enlarged with pulmonary vascularity within normal limits. No adenopathy. There is mid thoracic levoscoliosis.  IMPRESSION: There is increase in left base airspace consolidation. Lungs elsewhere clear. No change in cardiac silhouette.   Electronically Signed   By: Bretta Bang III M.D.   On: 02/06/2015 13:40     Assessment/Plan:  [name] [name] is an 29 y.o. 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 Overnight pt had period of hypotension and lethargy.  D/w caregiver at bedside and appears pt's  symptoms have improved but not on baseline.    - Unclear why periods of lethargy but pt appears that his mental status improving but not at baseline.  - CTH ordered as I see mild L facial droop that could be chronic for him.   - UA sent  - if symptoms improved likely d/c planning.   02/07/2015, 2:16 PM

## 2015-02-07 NOTE — Progress Notes (Signed)
Date: 02/07/2015,   MRN# [id number] [name] [name] 10-18-1985 Code Status:     Code Status Orders        Start     Ordered   01/29/15 1558  Full code   Continuous     01/29/15 1557    Advance Directive Documentation        Most Recent Value   Type of Advance Directive  Healthcare Power of Attorney   Pre-existing out of facility DNR order (yellow form or pink MOST form)     "MOST" Form in Place?       Hosp day:@LENGTHOFSTAYDAYS @ Referring MD: @     PCP:   HPI: ? Worsening LLL infiltrate, high risk of repeat aspiration  PMHX:   Past Medical History  Diagnosis Date  . Hepatitis C   . Scoliosis   . 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 Subarachnoid hemorrhage   . Pulmonary emboli   . Hepatitis C   . Depression    Surgical Hx:  Past Surgical History  Procedure Laterality Date  . Fracture surgery      left acetabulum  . Ileostomy    . Lung tube    . Ankle surgery Bilateral   . Tracheostomy    . Peg tube placement     Family Hx:  Family History  Problem Relation Age of Onset  . Hypertension     Social Hx:   History  Substance Use Topics  . Smoking status: Former Smoker    Quit date: 08/07/2011  . Smokeless tobacco: Never Used  . Alcohol Use: No   Medication:    Home Medication:  Current Outpatient Rx  Name  Route  Sig  Dispense  Refill  . Nutritional Supplements (FEEDING SUPPLEMENT, JEVITY 1.5 CAL/FIBER,) LIQD   Per Tube   Place 237 mLs into feeding tube 3 (three) times daily between meals.   1000 mL   3   . Water For Irrigation, Sterile (FREE WATER) SOLN   Per Tube   Place 300 mLs into feeding tube every 6 (six) hours.   1000 mL   11     Current Medication: @   Allergies:  Ambien; Ativan; Depakote er; Dilaudid; and Keppra  Review of Systems: Hard to obtain   Other:  All other systems negative  Physical Examination:   VS: BP 108/62 mmHg  Pulse 70  Temp(Src) 98.4 F (36.9 C) (Oral)  Resp 16  Ht 6' (1.829 m)  Wt  206 lb 8 oz (93.668 kg)  BMI 28.00 kg/m2  SpO2 94%  General Appearance: No distress  Neuro: without focal findings, mental status, speech normal, alert and oriented, cranial nerves 2-12 intact, reflexes normal and symmetric, sensation grossly normal  HEENT: PERRLA, EOM intact, no ptosis, no other lesions noticed, Mallampati: Pulmonary:.No wheezing, No rales  Sputum Production:   Cardiovascular:  Normal S1,S2.  No m/r/g.  Abdominal aorta pulsation normal.    Abdomen:Benign, Soft, non-tender, No masses, hepatosplenomegaly, No lymphadenopathy Endoc: No evident thyromegaly, no signs of acromegaly or Cushing features Skin:   warm, no rashes, no ecchymosis  Extremities: normal, no cyanosis, clubbing, no edema, warm with normal capillary refill. Other findings:   Labs results:   Recent Labs     02/06/15  0517  02/07/15  0657  HGB  11.1*  11.3*  HCT  34.5*  34.1*  MCV  81.3  81.5  WBC  6.4  8.1  BUN  16  16  CREATININE  0.69  0.63  GLUCOSE  112*  89  CALCIUM  8.6*  8.2*  ,    Rad results:   Ct Head Wo Contrast  02/07/2015   CLINICAL DATA:  29 year old male with lethargy. Episode of hypotension. Hepatitis C, history of traumatic brain injury with progressive encephalopathy. Initial encounter.  EXAM: CT HEAD WITHOUT CONTRAST  TECHNIQUE: Contiguous axial images were obtained from the base of the skull through the vertex without intravenous contrast.  COMPARISON:  Noncontrast brain MRI 06/15/2014. Head CT without contrast 05/16/2014.  FINDINGS: Chronic cerebral volume loss with stable mild ventricular prominence. No midline shift, mass effect, or evidence of intracranial mass lesion. Punctate dystrophic calcification in the anterior right frontal lobe. Chronic left anterior subinsular encephalomalacia Re identified. No acute intracranial hemorrhage identified. No evidence of cortically based acute infarction identified.  Opacified right maxillary sinus. Other visualized paranasal sinuses and  mastoids are clear. No acute osseous abnormality identified. No acute orbit or scalp soft tissue findings.  IMPRESSION: 1. Stable appearance of the brain. No acute intracranial abnormality evident on noncontrast CT. 2. Chronic right maxillary sinus opacification.   Electronically Signed   By: Odessa FlemingH  Hall M.D.   On: 02/07/2015 15:23   CLINICAL DATA: Persistent pneumonia  EXAM: PORTABLE CHEST - 1 VIEW  COMPARISON: February 02, 2015  FINDINGS: There is increase in left base airspace consolidation. Right lung is clear. Heart is mildly enlarged with pulmonary vascularity within normal limits. No adenopathy. There is mid thoracic levoscoliosis.  IMPRESSION: There is increase in left base airspace consolidation. Lungs elsewhere clear. No change in cardiac silhouette.   Electronically Signed  By: Bretta BangWilliam Woodruff III M.D.  On: 02/06/2015 13:40   Assessment and Plan: Acute respiratory failure, due to left lower lobe and upper lobe pneumonia likely as result of aspiration pneumonia and possible failed outpatient therapy. Marland Kitchen.History of recurrent pneumonias, his repeat chest xray is not much different from the cxr from 02/02/15   -Continue with same antibiotics  -Continue to maintain saturations above 88%, BiPAP at night, intermittent BiPAP during the day, incentive spirometry. -pulmonary toilet -anti aspiration measures (high risk off aspirating), stop po intake -following  - Diet recommendations: Dysphagia 2 (fine chop);Honey-thick liquid Liquids provided via: Teaspoon;Cup;Straw Medication Administration: Crushed with puree Supervision: Full supervision/cueing for compensatory strategies;Trained caregiver to feed patient;Staff to assist with self feeding Compensations: Slow rate;Small sips/bites;Follow solids with liquid Postural Changes and/or Swallow Maneuvers: Seated upright 90 degrees   History of pulmonary embolism; continue Eliquis as taking at home    I have personally  obtained a history, examined the patient, evaluated laboratory and imaging results, formulated the assessment and plan and placed orders.  The Patient requires high complexity decision making for assessment and support, frequent evaluation and titration of therapies, application of advanced monitoring technologies and extensive interpretation of multiple databases.   Mitzie Marlar,M.D. Pulmonary & Critical care Medicine [facility]

## 2015-02-07 NOTE — Progress Notes (Signed)
Patient ID: [name] [name], male   DOB: 05-11-1986, 29 y.o.   MRN: [id number] SUBJECTIVE:  Admitted with aspiration pneumonitis and acute resp failure; films with LLL infiltrate.  Pt with h/o TBI with tetraplegia, s/p PEG, though at home has been able to take some food/liquid by mouth.  Had been improving up to yesterday, per family, when he became more lethargic.  Sats decreased at times overnight.  Has h/o agitation since TBI; neurontin held yesterday due to lethargy.  Family at bedside; they are concerned he may have aspirated again.  Have also noted some unusual behaviors; report pt with episodes of appearing to be suckling and are worried about neurologic change.  Per family, at baseline he can move right arm and can speak, though speech is challenging to comprehend.  ______________________________________________________________________  ROS: Please see HPI; unable to obtain any history from pt   Past Medical History  Diagnosis Date  . Hepatitis C   . Scoliosis   . 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 Subarachnoid hemorrhage   . Pulmonary emboli   . Hepatitis C   . Depression     Past Surgical History  Procedure Laterality Date  . Fracture surgery      left acetabulum  . Ileostomy    . Lung tube    . Ankle surgery Bilateral   . Tracheostomy    . Peg tube placement       Current facility-administered medications:  .  acetaminophen (TYLENOL) tablet 650 mg, 650 mg, Oral, Q6H PRN **OR** acetaminophen (TYLENOL) suppository 650 mg, 650 mg, Rectal, Q6H PRN, [name], MD .  albuterol (PROVENTIL) (2.5 MG/3ML) 0.083% nebulizer solution 3 mL, 3 mL, Inhalation, Q4H PRN, [name], MD, 3 mL at 02/06/15 1521 .  antiseptic oral rinse (CPC / CETYLPYRIDINIUM CHLORIDE 0.05%) solution 7 mL, 7 mL, Mouth Rinse, q12n4p, [name], MD, 7 mL at 02/06/15 1319 .  apixaban (ELIQUIS) tablet 5 mg, 5 mg, Oral, BID, [name], MD, 5 mg at 02/05/15 2210 .  baclofen (LIORESAL) tablet 20  mg, 20 mg, Oral, TID, [name], MD, 20 mg at 02/05/15 2323 .  bisacodyl (DULCOLAX) suppository 10 mg, 10 mg, Rectal, QHS, [name], MD, 10 mg at 02/06/15 2054 .  chlorhexidine (PERIDEX) 0.12 % solution 15 mL, 15 mL, Mouth Rinse, BID, [name], MD, 15 mL at 02/06/15 2111 .  dextrose 5 %-0.45 % sodium chloride infusion, , Intravenous, Continuous, Marguarite Arbour, MD, Last Rate: 75 mL/hr at 02/07/15 0402 .  docusate sodium (COLACE) capsule 100 mg, 100 mg, Oral, BID, [name], MD, 100 mg at 02/05/15 2209 .  feeding supplement (JEVITY 1.5 CAL/FIBER) liquid 237 mL, 237 mL, Per Tube, TID BM, [name], MD, 237 mL at 02/06/15 2042 .  free water 300 mL, 300 mL, Per Tube, 4 times per day, [name], MD, 300 mL at 02/07/15 0600 .  indomethacin (INDOCIN SR) capsule 75 mg, 75 mg, Oral, Daily, [name], MD, 75 mg at 02/05/15 1131 .  ipratropium-albuterol (DUONEB) 0.5-2.5 (3) MG/3ML nebulizer solution 3 mL, 3 mL, Nebulization, Q6H, Curtis Sites III, MD .  lacosamide (VIMPAT) tablet 200 mg, 200 mg, Oral, BID, [name], MD, 200 mg at 02/05/15 2210 .  multivitamin with minerals tablet 1 tablet, 1 tablet, Oral, Daily, [name], MD, 1 tablet at 02/05/15 1130 .  OLANZapine (ZYPREXA) tablet 15 mg, 15 mg, Oral, QHS, [name], MD, 15 mg at 02/05/15 2323 .  ondansetron (ZOFRAN) tablet 4 mg,  4 mg, Oral, Q6H PRN **OR** ondansetron (ZOFRAN) injection 4 mg, 4 mg, Intravenous, Q6H PRN, Auburn BilberryShreyang Patel, MD .  pantoprazole sodium (PROTONIX) 40 mg/20 mL oral suspension 40 mg, 40 mg, Per Tube, BID, Danella PentonMark F Miller, MD, 40 mg at 02/06/15 2043 .  piperacillin-tazobactam (ZOSYN) IVPB 3.375 g, 3.375 g, Intravenous, 3 times per day, Danella PentonMark F Miller, MD, 3.375 g at 02/07/15 0541 .  propranolol (INDERAL) 20 MG/5ML solution 20 mg, 20 mg, Per Tube, TID, Danella PentonMark F Miller, MD, 20 mg at 02/06/15 2043 .  vancomycin (VANCOCIN) IVPB 1000 mg/200 mL premix, 1,000 mg, Intravenous, Q12H, Danella PentonMark F  Miller, MD, 1,000 mg at 02/07/15 0006  PHYSICAL EXAM:  BP 96/57 mmHg  Pulse 77  Temp(Src) 98.4 F (36.9 C) (Oral)  Resp 16  Ht 6' (1.829 m)  Wt 93.668 kg (206 lb 8 oz)  BMI 28.00 kg/m2  SpO2 95%  General: Well developed, well nourished male, opens and closes eyes spontaneously, but otherwise not interactive HEENT: PERRL; OP slightly dry without lesions. Neck: increased tension, trachea midline, no thyromegaly Chest: normal to palpation Lungs: shallow breaths without retractions or wheezes Cardiovascular: RRR, distant; distal pulses 2+ Abdomen: soft, nontender, nondistended, positive bowel sounds.  PEG in place Extremities: increased spasticity; no edema Derm: no significant rashes or nodules; good skin turgor Lymph: no cervical or supraclavicular lymphadenopathy  Labs and imaging studies were reviewed  ASSESSMENT/PLAN:   1. Aspiration pneumonia/acute resp failure- CXR with progression of LLL compared to 7/7; will ask pulmonary to see.  Consider chest PT if he awakens more.  Would give all meds down PEG for now; avoid anything by mouth.  Cont abx; consider steroids.  Follow exam, but limited by pt inability to provide deep breaths. 2. Traumatic brain injury history/tetraplegia- Neurology consultation after discussion with family; suspect changes due to acute illness and recent med changes.  DVT prophyllaxis with eliquis 3. Hypotension- reduce propranolol; may need to increase IVF.  Follow lytes

## 2015-02-08 DIAGNOSIS — G40A19 Absence epileptic syndrome, intractable, without status epilepticus: Secondary | ICD-10-CM

## 2015-02-08 LAB — GLUCOSE, CAPILLARY
GLUCOSE-CAPILLARY: 102 mg/dL — AB (ref 65–99)
Glucose-Capillary: 97 mg/dL (ref 65–99)
Glucose-Capillary: 93 mg/dL (ref 65–99)
Glucose-Capillary: 83 mg/dL (ref 65–99)
Glucose-Capillary: 88 mg/dL (ref 65–99)

## 2015-02-08 LAB — COMPREHENSIVE METABOLIC PANEL
ALBUMIN: 3 g/dL — AB (ref 3.5–5.0)
ALT: 57 U/L (ref 17–63)
AST: 31 U/L (ref 15–41)
Alkaline Phosphatase: 59 U/L (ref 38–126)
Anion gap: 7 (ref 5–15)
BUN: 12 mg/dL (ref 6–20)
CO2: 32 mmol/L (ref 22–32)
Calcium: 8.2 mg/dL — ABNORMAL LOW (ref 8.9–10.3)
Chloride: 105 mmol/L (ref 101–111)
Creatinine, Ser: 0.56 mg/dL — ABNORMAL LOW (ref 0.61–1.24)
GLUCOSE: 98 mg/dL (ref 65–99)
Potassium: 3.5 mmol/L (ref 3.5–5.1)
SODIUM: 144 mmol/L (ref 135–145)
TOTAL PROTEIN: 6.1 g/dL — AB (ref 6.5–8.1)
Total Bilirubin: 0.5 mg/dL (ref 0.3–1.2)

## 2015-02-08 LAB — CBC
HEMATOCRIT: 34.7 % — AB (ref 40.0–52.0)
Hemoglobin: 11.1 g/dL — ABNORMAL LOW (ref 13.0–18.0)
MCH: 26.3 pg (ref 26.0–34.0)
MCHC: 32 g/dL (ref 32.0–36.0)
MCV: 82.2 fL (ref 80.0–100.0)
PLATELETS: 227 10*3/uL (ref 150–440)
RBC: 4.21 MIL/uL — AB (ref 4.40–5.90)
RDW: 16.1 % — AB (ref 11.5–14.5)
WBC: 8.3 10*3/uL (ref 3.8–10.6)

## 2015-02-08 MED ORDER — VALPROIC ACID 250 MG/5ML PO SYRP
500.0000 mg | ORAL_SOLUTION | Freq: Two times a day (BID) | ORAL | Status: DC
  Filled 2015-02-08: qty 10

## 2015-02-08 MED ORDER — VALPROIC ACID 250 MG/5ML PO SYRP
1000.0000 mg | ORAL_SOLUTION | Freq: Once | ORAL | Status: DC
  Filled 2015-02-08: qty 20

## 2015-02-08 MED ORDER — CEFUROXIME AXETIL 125 MG/5ML PO SUSR
250.0000 mg | Freq: Two times a day (BID) | ORAL | Status: DC
  Filled 2015-02-08: qty 10

## 2015-02-08 MED ORDER — SULFAMETHOXAZOLE-TRIMETHOPRIM 200-40 MG/5ML PO SUSP
20.0000 mL | Freq: Two times a day (BID) | ORAL | Status: DC
  Administered 2015-02-08 – 2015-02-14 (×13): 20 mL
  Filled 2015-02-08 (×16): qty 20

## 2015-02-08 MED ORDER — CEFUROXIME AXETIL 250 MG/5ML PO SUSR
250.0000 mg | Freq: Two times a day (BID) | ORAL | Status: DC
  Filled 2015-02-08 (×2): qty 5

## 2015-02-08 MED ORDER — LAMOTRIGINE 100 MG PO TABS: 100.0000 mg | ORAL_TABLET | Freq: Two times a day (BID) | ORAL | Status: DC

## 2015-02-08 MED ORDER — CEFUROXIME AXETIL 250 MG/5ML PO SUSR
250.0000 mg | Freq: Two times a day (BID) | ORAL | Status: DC
  Administered 2015-02-08 – 2015-02-12 (×9): 250 mg
  Filled 2015-02-08 (×13): qty 5

## 2015-02-08 MED ORDER — VALPROIC ACID 250 MG/5ML PO SYRP
500.0000 mg | ORAL_SOLUTION | Freq: Two times a day (BID) | ORAL | Status: DC
  Administered 2015-02-08 – 2015-02-10 (×4): 500 mg
  Filled 2015-02-08 (×5): qty 10

## 2015-02-08 MED ORDER — VALPROIC ACID 250 MG/5ML PO SYRP
1000.0000 mg | ORAL_SOLUTION | Freq: Once | ORAL | Status: AC
  Administered 2015-02-08: 1000 mg
  Filled 2015-02-08: qty 20

## 2015-02-08 NOTE — Progress Notes (Signed)
PT Cancellation Note  Patient Details Name: [name] [name] MRN: [id number] DOB: 02/17/1986   Cancelled Treatment:    Reason Eval/Treat Not Completed: Patient declined, no reason specified.  Agitated and keeping eyes closed when PT attempted x 2 to see him.  SW shared pt has been denied for CIR and will go home.   Ivar DrapeStout, Adlai Sinning E 02/08/2015, 10:25 AM   Samul Dadauth Toluwani Ruder, PT MS Acute Rehab Dept. Number: [facility] [phone] and [phone]

## 2015-02-08 NOTE — Progress Notes (Signed)
Date: 02/08/2015,   MRN# [id number] Fayrene [name] Carta 01/27/1986 Code Status:     Code Status Orders        Start     Ordered   01/29/15 1558  Full code   Continuous     01/29/15 1557    Advance Directive Documentation        Most Recent Value   Type of Advance Directive  Healthcare Power of Attorney   Pre-existing out of facility DNR order (yellow form or pink MOST form)     "MOST" Form in Place?       Hosp day:@LENGTHOFSTAYDAYS @ Referring MD: @ATDPROV @         HPI: Sister in room, feeding patient, more awake, tolerating feeding well thus. No fever  PMHX:   Past Medical History  Diagnosis Date  . Hepatitis C   . Scoliosis   . 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 Subarachnoid hemorrhage   . Pulmonary emboli   . Hepatitis C   . Depression    Surgical Hx:  Past Surgical History  Procedure Laterality Date  . Fracture surgery      left acetabulum  . Ileostomy    . Lung tube    . Ankle surgery Bilateral   . Tracheostomy    . Peg tube placement     Family Hx:  Family History  Problem Relation Age of Onset  . Hypertension     Social Hx:   History  Substance Use Topics  . Smoking status: Former Smoker    Quit date: 08/07/2011  . Smokeless tobacco: Never Used  . Alcohol Use: No   Medication:    Home Medication:  Current Outpatient Rx  Name  Route  Sig  Dispense  Refill  . Nutritional Supplements (FEEDING SUPPLEMENT, JEVITY 1.5 CAL/FIBER,) LIQD   Per Tube   Place 237 mLs into feeding tube 3 (three) times daily between meals.   1000 mL   3   . Water For Irrigation, Sterile (FREE WATER) SOLN   Per Tube   Place 300 mLs into feeding tube every 6 (six) hours.   1000 mL   11     Current Medication: @CURMEDTAB @   Allergies:  Ambien; Ativan; Depakote er; Dilaudid; and Keppra  Review of Systems:  Physical Examination:   VS: BP 110/63 mmHg  Pulse 81  Temp(Src) 98.6 F (37 C) (Oral)  Resp 17  Ht 6' (1.829 m)  Wt 210 lb 3.2 oz (95.346 kg)  BMI  28.50 kg/m2  SpO2 96%  General Appearance: No distress  Neuro: no change increase spasticity  HEENT: PERRLA, EOM intact, no ptosis, no other lesions noticed Pulmonary:.shallow breathing, No wheezing, No rales, somewhat decrease on the left base     Cardiovascular:  Normal S1,S2.  No m/r/g.  Abdomen:Benign, Soft, non-tender, No masses, hepatosplenomegaly, No lymphadenopathy Endoc: No evident thyromegaly, no signs of acromegaly or Cushing features Skin:   warm, no rashes, no ecchymosis  Extremities: normal, no cyanosis, clubbing, no edema, warm with normal capillary refill. Other findings:   Labs results:   Recent Labs     02/06/15  0517  02/07/15  0657  02/08/15  0456  HGB  11.1*  11.3*  11.1*  HCT  34.5*  34.1*  34.7*  MCV  81.3  81.5  82.2  WBC  6.4  8.1  8.3  BUN  16  16  12   CREATININE  0.69  0.63  0.56*  GLUCOSE  112*  89  98  CALCIUM  8.6*  8.2*  8.2*  ,    Assessment and Plan: Acute respiratory failure, due to left lower lobe and upper lobe pneumonia likely as result of aspiration pneumonia  his repeat chest xray7/12 is not much different from the cxr from 02/02/15   -Continue with same antibiotics bactrim/ceftin -Continue to maintain saturations above 88%, BiPAP at night,  -pulmonary toilet -anti aspiration measures (high risk off aspirating), stop po intake -home soone -following speech recs below  - Diet recommendations: Dysphagia 2 (fine chop);Honey-thick liquid Liquids provided via: Teaspoon;Cup;Straw Medication Administration: Crushed with puree Supervision: Full supervision/cueing for compensatory strategies;Trained caregiver to feed patient;Staff to assist with self feeding Compensations: Slow rate;Small sips/bites;Follow solids with liquid Postural Changes and/or Swallow Maneuvers: Seated upright 90 degrees    I have personally obtained a history, examined the patient, evaluated laboratory and imaging results, formulated the assessment and plan and  placed orders.  The Patient requires high complexity decision making for assessment and support, frequent evaluation and titration of therapies, application of advanced monitoring technologies and extensive interpretation of multiple databases.   Marlean Mortell,M.D. Pulmonary & Critical care Medicine [facility]

## 2015-02-08 NOTE — Progress Notes (Signed)
Patient ID: [name] [name], male   DOB: 03/11/1986, 29 y.o.   MRN: [id number] SUBJECTIVE:  Admitted with aspiration pneumonitis and acute resp failure; films with LLL infiltrate.  Pt with h/o TBI with tetraplegia, s/p PEG.  Has had worsening lethargy/metabolic encephalopathy; yesterday seemed much better, per nursing staff who have worked with pt extensively in past. Pt removed condom catheter.  Consented to CPAP overnight (allowed this to be placed around 2 AM).  No fever, chills. Sats stable.  No new neuro changes reported.  Opens eyes briefly to stimulation this morning.  ______________________________________________________________________  ROS: Please see HPI; unable to obtain any history from pt   Past Medical History  Diagnosis Date  . Hepatitis C   . Scoliosis   . 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 Subarachnoid hemorrhage   . Pulmonary emboli   . Hepatitis C   . Depression     Past Surgical History  Procedure Laterality Date  . Fracture surgery      left acetabulum  . Ileostomy    . Lung tube    . Ankle surgery Bilateral   . Tracheostomy    . Peg tube placement       Current facility-administered medications:  .  acetaminophen (TYLENOL) tablet 650 mg, 650 mg, Oral, Q6H PRN **OR** acetaminophen (TYLENOL) suppository 650 mg, 650 mg, Rectal, Q6H PRN, Auburn BilberryShreyang Patel, MD .  albuterol (PROVENTIL) (2.5 MG/3ML) 0.083% nebulizer solution 3 mL, 3 mL, Inhalation, Q4H PRN, Auburn BilberryShreyang Patel, MD, 3 mL at 02/06/15 1521 .  antiseptic oral rinse (CPC / CETYLPYRIDINIUM CHLORIDE 0.05%) solution 7 mL, 7 mL, Mouth Rinse, q12n4p, Auburn BilberryShreyang Patel, MD, 7 mL at 02/07/15 1718 .  apixaban (ELIQUIS) tablet 5 mg, 5 mg, Oral, BID, Auburn BilberryShreyang Patel, MD, 5 mg at 02/07/15 2150 .  baclofen (LIORESAL) tablet 20 mg, 20 mg, Oral, TID, Auburn BilberryShreyang Patel, MD, 20 mg at 02/07/15 2150 .  bisacodyl (DULCOLAX) suppository 10 mg, 10 mg, Rectal, QHS, Clydie Braunavid Fitzgerald, MD, 10 mg at 02/07/15 2150 .  cefUROXime (CEFTIN) 250  MG/5ML suspension 250 mg, 250 mg, Per Tube, Q12H, Curtis SitesBert J Klein III, MD .  chlorhexidine (PERIDEX) 0.12 % solution 15 mL, 15 mL, Mouth Rinse, BID, Auburn BilberryShreyang Patel, MD, 15 mL at 02/06/15 2111 .  docusate sodium (COLACE) capsule 100 mg, 100 mg, Oral, BID, Auburn BilberryShreyang Patel, MD, 100 mg at 02/07/15 1018 .  feeding supplement (JEVITY 1.5 CAL/FIBER) liquid 237 mL, 237 mL, Per Tube, TID BM, Clydie Braunavid Fitzgerald, MD, 237 mL at 02/07/15 2153 .  free water 300 mL, 300 mL, Per Tube, 4 times per day, Clydie Braunavid Fitzgerald, MD, 300 mL at 02/08/15 0702 .  ipratropium-albuterol (DUONEB) 0.5-2.5 (3) MG/3ML nebulizer solution 3 mL, 3 mL, Nebulization, Q6H, Curtis SitesBert J Klein III, MD, 3 mL at 02/07/15 2003 .  lacosamide (VIMPAT) tablet 200 mg, 200 mg, Oral, BID, Auburn BilberryShreyang Patel, MD, 200 mg at 02/07/15 2151 .  multivitamin with minerals tablet 1 tablet, 1 tablet, Oral, Daily, Auburn BilberryShreyang Patel, MD, 1 tablet at 02/07/15 1018 .  OLANZapine (ZYPREXA) tablet 15 mg, 15 mg, Oral, QHS, Auburn BilberryShreyang Patel, MD, 15 mg at 02/07/15 2150 .  ondansetron (ZOFRAN) tablet 4 mg, 4 mg, Oral, Q6H PRN **OR** ondansetron (ZOFRAN) injection 4 mg, 4 mg, Intravenous, Q6H PRN, Auburn BilberryShreyang Patel, MD .  pantoprazole sodium (PROTONIX) 40 mg/20 mL oral suspension 40 mg, 40 mg, Per Tube, BID, Danella PentonMark F Miller, MD, 40 mg at 02/07/15 2153 .  propranolol (INDERAL) 20 MG/5ML solution 20 mg, 20 mg, Per Tube,  BID, Curtis Sites III, MD, 20 mg at 02/07/15 2238 .  sulfamethoxazole-trimethoprim (BACTRIM,SEPTRA) 200-40 MG/5ML suspension 20 mL, 20 mL, Oral, Q12H, Curtis Sites III, MD  PHYSICAL EXAM:  BP 87/49 mmHg  Pulse 61  Temp(Src) 98.2 F (36.8 C) (Axillary)  Resp 16  Ht 6' (1.829 m)  Wt 95.346 kg (210 lb 3.2 oz)  BMI 28.50 kg/m2  SpO2 96%  General: Well developed, well nourished male, opens and closes eyes spontaneously, but otherwise not interacting HEENT: PERRL; post op changes;OP moist without lesions. Neck: increased tension, trachea midline, no thyromegaly Chest: normal  to palpation Lungs: shallow breaths without retractions or wheezes; left side crackles Cardiovascular: RRR, distant; distal pulses 2+ Abdomen: soft, nontender, nondistended, positive bowel sounds.  PEG in place Extremities: increased spasticity; no edema Derm: no significant rashes or nodules; good skin turgor Lymph: no cervical or supraclavicular lymphadenopathy  Labs and imaging studies were reviewed  ASSESSMENT/PLAN:   1. Aspiration pneumonia/acute resp failure- appreciate pulmonology; cont inhaled medications.  Avoiding anything by mouth if possible; meds/nutrition by PEG.  Change abx to ceftin for aspiration and septra for MRSA screen positive.  Follow for fever 2. Traumatic brain injury history/tetraplegia/metabolic encephalopathy- appreciate Neurology consultation.  CT without acute change.  Mental status seems to be approaching baseline, per staff who know pt well 3. Hypotension- hold propranolol.  Family reports BP runs low at times.    4. D/c plan- will see how pt does on abx by PEG; if stable, hopefully can go home tomorrow

## 2015-02-08 NOTE — Progress Notes (Signed)
Finally able to put patient on home bipap unit since he is sleeping. svn deferred in fear he may wake up and refuse all therapy. Patient in no distress at this time. BBS remain clear and diminished.

## 2015-02-08 NOTE — Care Management (Signed)
Spoke with Guerry MinorsGina Asku at [facility] intake who informed me that patient insurer has denied approval for services. Will inform Dr Hyacinth MeekerMiller and family. Continue discharge planning.

## 2015-02-08 NOTE — Progress Notes (Addendum)
Neurology: As per family pt has been having episodes of decreased responsiveness/blank stares that sound like absence seizures.  Pt is on vimpat.   D/w pharmacy.  Pt's allergy to VPA is increased sweating.  Loaded with VPA and started BID. EEG in AM  [facility]YLIKMAN, Doyle AskewYURIY

## 2015-02-08 NOTE — Progress Notes (Signed)
Physical Therapy Treatment Patient Details Name: [name] [name] MRN: [id number] DOB: 10/09/1985 Today's Date: 02/08/2015    History of Present Illness Pt with TBI and SCI in 2013, has had numerous medical procedures and bouts of inpatient rehab since.  Pt has some limited use of R UE at baseline but apparently has been essentially dependent for mobility/ADLs since the accident    PT Comments    Pt was able to work bedside with his sister providing continual support to encourage him, to make his mood lighter and to physically assist him.  He is currently being appealed for CIR coverage with insurance and MD, and will await the decision.  If this does not happen, would recommend SNF consideration for follow up to increase his sitting control and tolerance.  Follow Up Recommendations  CIR     Equipment Recommendations  None recommended by PT    Recommendations for Other Services Rehab consult     Precautions / Restrictions Precautions Precautions: Other (comment) (telemetry, unable to move off bed) Restrictions Weight Bearing Restrictions: No    Mobility  Bed Mobility Overal bed mobility: Needs Assistance;+2 for physical assistance;+ 2 for safety/equipment Bed Mobility: Supine to Sit;Sit to Supine     Supine to sit: Total assist;+2 for physical assistance;+2 for safety/equipment Sit to supine: Total assist;+2 for physical assistance;+2 for safety/equipment      Transfers Overall transfer level: Needs assistance               General transfer comment: Pt is too weak in sitting to assist standing and would be hoyer transfer  Ambulation/Gait                 Stairs            Wheelchair Mobility    Modified Rankin (Stroke Patients Only)       Balance                                    Cognition Arousal/Alertness: Lethargic Behavior During Therapy: Agitated;Flat affect Overall Cognitive Status: History of cognitive impairments - at  baseline                      Exercises      General Comments General comments (skin integrity, edema, etc.): Pt was able to tolerate assisted sitting and was max of 2 then propped on RUE on pillows to be mod max of 1      Pertinent Vitals/Pain Pain Assessment: Faces Pain Score: 0-No pain    Home Living                      Prior Function            PT Goals (current goals can now be found in the care plan section) Acute Rehab PT Goals Patient Stated Goal: Did not indicate any goals PT Goal Formulation: Patient unable to participate in goal setting Progress towards PT goals: Progressing toward goals    Frequency  Min 2X/week    PT Plan Current plan remains appropriate    Co-evaluation             End of Session Equipment Utilized During Treatment: Oxygen Activity Tolerance: Treatment limited secondary to agitation;Patient limited by lethargy;Patient limited by fatigue Patient left: in bed;with call bell/phone within reach;with family/visitor present     Time: 7829-5621 PT Time Calculation (min) (  ACUTE ONLY): 23 min  Charges:  $Therapeutic Exercise: 8-22 mins $Therapeutic Activity: 8-22 mins                    G Codes:      Ivar DrapeStout, Jobina Maita E 02/08/2015, 3:45 PM   Samul Dadauth Shakeda Pearse, PT MS Acute Rehab Dept. Number: [facility] [id number] and [facility] [phone]

## 2015-02-08 NOTE — Care Management (Signed)
Spoke with patient mother Jeronimo NormaJeanie to inform of denial by [facility] for acute inpatient re[facility].  Jeronimo NormaJeanie stated that she had been in contact with [facility] who is the patient NCM [phone].  Mother stated that she had been informed by Coy SaunasMargo at [facility] to resubmit the authorization with current PT notes since Monday. I contacted Guerry MinorsGina Asku at [facility] who stated to me that this was inappropriate and not what needed to be done to appeal the denial. She stated to me that  A peer to peer review had to take place between the attending physician and the medical director at [facility] and gave me the contact  Information.  Contact information for this appointment is [phone] Drs name and cell phone must be provided and appointment will be set. Paged Dr Graciela HusbandsKlein to discuss awaiting call back. PT continues to work with patient who has not been participatory. Discussed case multiple times with physical therapist Samul DadaRuth Stout and she stated that patient even though he is alert now is non participatory. Contacted Margo at [employer] who is the CM for the patient to discuss conversation with the mother Jeronimo NormaJeanie. Coy SaunasMargo stated that the father Raiford NobleRick had spoken with her concerning the denial and that he had stated that physical therapy was working with the patient and that they had discussed case but that she had not told him to resubmit PT

## 2015-02-09 ENCOUNTER — Inpatient Hospital Stay: Payer: BLUE CROSS/BLUE SHIELD

## 2015-02-09 LAB — GLUCOSE, CAPILLARY
GLUCOSE-CAPILLARY: 104 mg/dL — AB (ref 65–99)
GLUCOSE-CAPILLARY: 148 mg/dL — AB (ref 65–99)
GLUCOSE-CAPILLARY: 89 mg/dL (ref 65–99)
Glucose-Capillary: 111 mg/dL — ABNORMAL HIGH (ref 65–99)
Glucose-Capillary: 115 mg/dL — ABNORMAL HIGH (ref 65–99)

## 2015-02-09 NOTE — Care Management (Signed)
Spoke with Dr Marikay AlarKlien this morning about  Rehab Denial and appeals process. Dr Marikay AlarKlien stated that he would call [facility] and Speak with intake Almira CoasterGina Asku concerning case. Gave contact information to Dr  Graciela HusbandsKlein.

## 2015-02-09 NOTE — Progress Notes (Signed)
Patient ID: [name] [name], male   DOB: [birth year], 29 y.o.   MRN: [id number] SUBJECTIVE:  Admitted with aspiration pneumonitis and acute resp failure; films with LLL infiltrate.  Pt with h/o TBI with quadriplegia, s/p PEG.  Has had worsening lethargy/metabolic encephalopathy; pt has been more awake over the course of the day.  Family reported episodes of staring spells, c/w absence seizures.  Started on depakote yesterday and getting EEG this AM.  Pt opens eyes briefly this AM, but o/w not interacting, similar to previous mornings.  ______________________________________________________________________  ROS: Please see HPI; unable to obtain any history from pt   Past Medical History  Diagnosis Date  . Hepatitis C   . Scoliosis   . 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 Subarachnoid hemorrhage   . Pulmonary emboli   . Hepatitis C   . Depression     Past Surgical History  Procedure Laterality Date  . Fracture surgery      left acetabulum  . Ileostomy    . Lung tube    . Ankle surgery Bilateral   . Tracheostomy    . Peg tube placement       Current facility-administered medications:  .  acetaminophen (TYLENOL) tablet 650 mg, 650 mg, Oral, Q6H PRN **OR** acetaminophen (TYLENOL) suppository 650 mg, 650 mg, Rectal, Q6H PRN, [name], MD .  albuterol (PROVENTIL) (2.5 MG/3ML) 0.083% nebulizer solution 3 mL, 3 mL, Inhalation, Q4H PRN, [name], MD, 3 mL at 02/06/15 1521 .  antiseptic oral rinse (CPC / CETYLPYRIDINIUM CHLORIDE 0.05%) solution 7 mL, 7 mL, Mouth Rinse, q12n4p, [name], MD, 7 mL at 02/07/15 1718 .  apixaban (ELIQUIS) tablet 5 mg, 5 mg, Oral, BID, [name], MD, 5 mg at 02/08/15 2241 .  baclofen (LIORESAL) tablet 20 mg, 20 mg, Oral, TID, [name], MD, 20 mg at 02/08/15 2241 .  bisacodyl (DULCOLAX) suppository 10 mg, 10 mg, Rectal, QHS, [name], MD, 10 mg at 02/08/15 2221 .  cefUROXime (CEFTIN) 250 MG/5ML suspension 250 mg, 250 mg, Per  Tube, Q12H, Curtis Sites III, MD, 250 mg at 02/08/15 2250 .  chlorhexidine (PERIDEX) 0.12 % solution 15 mL, 15 mL, Mouth Rinse, BID, [name], MD, 15 mL at 02/08/15 2000 .  docusate sodium (COLACE) capsule 100 mg, 100 mg, Oral, BID, [name], MD, 100 mg at 02/08/15 2241 .  feeding supplement (JEVITY 1.5 CAL/FIBER) liquid 237 mL, 237 mL, Per Tube, TID BM, [name], MD, 237 mL at 02/08/15 2200 .  free water 300 mL, 300 mL, Per Tube, 4 times per day, [name], MD, 300 mL at 02/09/15 0600 .  ipratropium-albuterol (DUONEB) 0.5-2.5 (3) MG/3ML nebulizer solution 3 mL, 3 mL, Nebulization, Q6H, Curtis Sites III, MD, 3 mL at 02/09/15 0222 .  lacosamide (VIMPAT) tablet 200 mg, 200 mg, Oral, BID, [name], MD, 200 mg at 02/08/15 2241 .  multivitamin with minerals tablet 1 tablet, 1 tablet, Oral, Daily, [name], MD, 1 tablet at 02/08/15 1327 .  OLANZapine (ZYPREXA) tablet 15 mg, 15 mg, Oral, QHS, [name], MD, 15 mg at 02/08/15 2241 .  ondansetron (ZOFRAN) tablet 4 mg, 4 mg, Oral, Q6H PRN **OR** ondansetron (ZOFRAN) injection 4 mg, 4 mg, Intravenous, Q6H PRN, [name], MD .  pantoprazole sodium (PROTONIX) 40 mg/20 mL oral suspension 40 mg, 40 mg, Per Tube, BID, [name], MD, 40 mg at 02/08/15 2241 .  sulfamethoxazole-trimethoprim (BACTRIM,SEPTRA) 200-40 MG/5ML suspension 20 mL, 20 mL, Per Tube, Q12H,  Curtis SitesBert J Klein III, MD, 20 mL at 02/08/15 2245 .  [COMPLETED] Valproic Acid (DEPAKENE) 250 MG/5ML syrup SYRP 1,000 mg, 1,000 mg, Per Tube, Once, 1,000 mg at 02/08/15 1359 **FOLLOWED BY** Valproic Acid (DEPAKENE) 250 MG/5ML syrup SYRP 500 mg, 500 mg, Per Tube, BID, Pauletta BrownsYuriy Zeylikman, MD, 500 mg at 02/08/15 2241  PHYSICAL EXAM:  BP 106/64 mmHg  Pulse 90  Temp(Src) 98.8 F (37.1 C) (Oral)  Resp 18  Ht 6' (1.829 m)  Wt 95.029 kg (209 lb 8 oz)  BMI 28.41 kg/m2  SpO2 96%  General: Well developed, well nourished male, opens and closes eyes occasionally, but  otherwise not interacting HEENT: PERRL; post op changes;OP moist without lesions. Neck: increased tension, trachea midline, no thyromegaly Chest: normal to palpation Lungs: shallow breaths without retractions or wheezes; left side crackles, stable Cardiovascular: RRR, distant; distal pulses 2+ Abdomen: soft, nontender, nondistended, positive bowel sounds.  PEG in place Extremities: increased spasticity throughout; no edema Derm: no significant rashes or nodules; good skin turgor Lymph: no cervical or supraclavicular lymphadenopathy  Labs and imaging studies were reviewed  ASSESSMENT/PLAN:   1. Aspiration pneumonia/acute resp failure- resp status improved; weaning oxygen as able.  CPAP at night.  Currently on ceftin for aspiration and septra for MRSA screen positive.  No fever, chills 2. Traumatic brain injury history/tetraplegia/metabolic encephalopathy- appreciate Neurology consultation.  CT without acute change. May have developed new type of seizure (remote h/o GTC sz, per family).  EEG today 3. Hypotension- holding propranolol; BP improved.  Family reports BP runs low at times.    4. D/c plan- has been turned down for inpt rehab.

## 2015-02-09 NOTE — Consult Note (Signed)
CC: lethargy  HPI: [name] [name] is an 29 y.o. 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 Overnight pt had period of hypotension and lethargy.  D/w caregiver at bedside and appears pt's symptoms have improved but not on baseline.    Past Medical History  Diagnosis Date  . Hepatitis C   . Scoliosis   . 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 Subarachnoid hemorrhage   . Pulmonary emboli   . Hepatitis C   . Depression     Past Surgical History  Procedure Laterality Date  . Fracture surgery      left acetabulum  . Ileostomy    . Lung tube    . Ankle surgery Bilateral   . Tracheostomy    . Peg tube placement      Family History  Problem Relation Age of Onset  . Hypertension      Social History:  reports that he quit smoking about 3 years ago. He has never used smokeless tobacco. He reports that he does not drink alcohol or use illicit drugs.  Allergies  Allergen Reactions  . Ambien [Zolpidem] Other (See Comments)    Patient start hallucinating.   . Ativan [Lorazepam] Itching and Other (See Comments)    Patient gets hot and sweaty. Hallucinations   . Depakote Er [Divalproex Sodium Er] Itching and Other (See Comments)    Patient gets hot and sweaty.  . Dilaudid [Hydromorphone] Hives and Other (See Comments)    Patient gets hot and sweaty.  Marland Kitchen. Keppra [Levetiracetam] Itching and Other (See Comments)    Patient gets hot and sweaty.    Medications: I have reviewed the patient's current medications.  ROS: Unable to obtain due to poor verbal output  Physical Examination: Blood pressure 121/73, pulse 100, temperature 98.2 F (36.8 C), temperature source Oral, resp. rate 17, height 6' (1.829 m), weight 95.029 kg (209 lb 8 oz), SpO2 95 %.  Pt is trying to state his name but increased moaning.   Slight movement of RUE  Follows simple commands by closing his eyes and sticking out  his tongue No movement LUE and b/l LE which is chronic.    Laboratory Studies:   Basic Metabolic Panel:  Recent Labs Lab 02/03/15 0514 02/04/15 0728 02/06/15 0517 02/07/15 0657 02/08/15 0456  NA 147* 143 142 141 144  K 3.9 3.6 3.9 3.5 3.5  CL 112* 106 106 102 105  CO2 28 31 30 31  32  GLUCOSE 103* 96 112* 89 98  BUN 10 13 16 16 12   CREATININE 0.70 0.61 0.69 0.63 0.56*  CALCIUM 8.6* 8.4* 8.6* 8.2* 8.2*    Liver Function Tests:  Recent Labs Lab 02/07/15 0657 02/08/15 0456  AST 27 31  ALT 47 57  ALKPHOS 56 59  BILITOT 0.5 0.5  PROT 5.9* 6.1*  ALBUMIN 3.0* 3.0*   No results for input(s): LIPASE, AMYLASE in the last 168 hours. No results for input(s): AMMONIA in the last 168 hours.  CBC:  Recent Labs Lab 02/03/15 0514 02/06/15 0517 02/07/15 0657 02/08/15 0456  WBC 5.4 6.4 8.1 8.3  NEUTROABS 3.1 3.5 5.2  --   HGB 11.4* 11.1* 11.3* 11.1*  HCT 35.4* 34.5* 34.1* 34.7*  MCV 82.1 81.3 81.5 82.2  PLT 263 246 225 227    Cardiac Enzymes: No results for input(s): CKTOTAL, CKMB, CKMBINDEX, TROPONINI in the last 168 hours.  BNP: Invalid input(s): POCBNP  CBG:  Recent Labs Lab 02/08/15 2022 02/09/15 0047 02/09/15 0447 02/09/15 0739 02/09/15 1206  GLUCAP 88 115* 104* 111* 148*    Microbiology: Results for orders placed or performed during the hospital encounter of 01/29/15  Blood culture (routine x 2)     Status: None   Collection Time: 01/29/15  1:10 PM  Result Value Ref Range Status   Specimen Description BLOOD  Final   Special Requests NONE  Final   Culture NO GROWTH 5 DAYS  Final   Report Status 02/03/2015 FINAL  Final  Blood culture (routine x 2)     Status: None   Collection Time: 01/29/15  1:26 PM  Result Value Ref Range Status   Specimen Description BLOOD  Final   Special Requests NONE  Final   Culture NO GROWTH 5 DAYS  Final   Report Status 02/03/2015 FINAL  Final  MRSA PCR Screening     Status: Abnormal   Collection Time: 01/29/15  4:02  PM  Result Value Ref Range Status   MRSA by PCR POSITIVE (A) NEGATIVE Final    Comment:        The GeneXpert MRSA Assay (FDA approved for NASAL specimens only), is one component of a comprehensive MRSA colonization surveillance program. It is not intended to diagnose MRSA infection nor to guide or monitor treatment for MRSA infections. CRITICAL RESULT CALLED TO, READ BACK BY AND VERIFIED WITH: CHERYL SMITH ON 01/29/15 AT 1750 BY JEF     Coagulation Studies: No results for input(s): LABPROT, INR in the last 72 hours.  Urinalysis:   Recent Labs Lab 02/07/15 1858  COLORURINE YELLOW*  LABSPEC 1.004*  PHURINE 7.0  GLUCOSEU NEGATIVE  HGBUR NEGATIVE  BILIRUBINUR NEGATIVE  KETONESUR NEGATIVE  PROTEINUR NEGATIVE  NITRITE NEGATIVE  LEUKOCYTESUR NEGATIVE    Lipid Panel:  No results found for: CHOL, TRIG, HDL, CHOLHDL, VLDL, LDLCALC  HgbA1C: No results found for: HGBA1C  Urine Drug Screen:  No results found for: LABOPIA, COCAINSCRNUR, LABBENZ, AMPHETMU, THCU, LABBARB  Alcohol Level: No results for input(s): ETH in the last 168 hours.  Other results: EKG: normal EKG, normal sinus rhythm, unchanged from previous tracings.  Imaging: No results found.   Assessment/Plan:  [name] [name] is an 29 y.o. 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 Overnight pt had period of hypotension and lethargy.  D/w caregiver at bedside and appears pt's symptoms have improved but not on baseline.    I believe there is a component of delirium as pt has increased sleeping episodes during the day ? Partial seizures. Loaded with VPA yesterday and started BID PNA treatment as per primary team EEG done, awaiting for official read.    [name] [name]

## 2015-02-09 NOTE — Progress Notes (Signed)
Patient ID: [name] [name] [name], male   DOB: 06/10/1986, 29 y.o.   MRN: [id number] Pt reevaluated, with family present.  Awake, but slightly sleepy.  Interacting with family.  No evidence of pain reported by family. EEG done this AM; results pending.  One episode of staring spell reported; family did not contact nursing, and was encouraged to do so if it recurs.  [facility] per family and CM request; left message and have not been contacted back.  Discussed with family that patients with chronic medical issues are not typically candidates for aggressive inpt rehab if it is not felt that the rehab can mostly resolve the issue.

## 2015-02-09 NOTE — Procedures (Signed)
Indication: Concern for Seizure / Event evaluation / Altered Mental Status  INTRODUCTION: Per Technician Report: metaboltic encephalopathy--acute resp failure--traumatic brain injury--spastic tetraplegia--pt less responsive now and had starring spell Medications: risperdal  protonix  lyrica  eliquis  baclofen  neurontin  vimpat  indocin  DESCRIPTION:  Electroencephalogram of above mentioned patient was acquired with [employer] equipment. Electrodes were placed according to internationally standardized 10-20 system, after individualized distance measurement.   The posterior background was characterized by minimally reactive 5-6 Hz theta slowing at best. Frequent left anterior temporal epileptiform discharge was noted. Hyperventilation was not performed due to patient condition. Photic stimulation did not induce seizure activity and photic driving response was not seen.   Patient entered sleep stages I and brief stage II, was noted. No abnormal activation occurred during sleep or at the time of arousal. Time (minutes): 23 min   IMPRESSION:  This is an abnormal EEG due to generalized slowing of the background and presence of left anterior temporal epileptiform discharge was noted.   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 No evidence of subclinical electrographic seizure activity from this short recording.  Generalized slowing is a nonspecific finding and can be seen in patients with dementia, toxic-metabolic encephalopathy, post-ictal stage, mental retardation etc.

## 2015-02-09 NOTE — Progress Notes (Signed)
Physical Therapy Treatment Patient Details Name: [name] [name] MRN: [id number] DOB: Jul 09, 1986 Today's Date: 02/09/2015    History of Present Illness Pt with TBI and SCI in 2013, has had numerous medical procedures and bouts of inpatient rehab since.  Pt has some limited use of R UE at baseline but apparently has been essentially dependent for mobility/ADLs since the accident    PT Comments    Patient continues with loud outbursts (yelling profanity at therapist) and agitated behavior (grabbing, squeezing at therapist) intermittently throughout session; redirectable with instruction from therapist.  Very minimal active effort/participation with therapeutic interventions, positioning and bed mobility.  Continues to be dep +1-2 for all mobility efforts; unsafe for unsupported sitting or OOB attempts at this time. Caregiver present in room throughout session--reports that at baseline, patient receives all ADLs dep at bed-level.  States she sometimes "stands and pivots" patient to his power WC, but provides 90-100% of physical assist to complete; typically uses overhead mechanical lift system within the home.  Reports patient utilizes power WC as primary mobility, dep assist to control/steer. Appears patient is largely at baseline level of function and has limited ability to make significant functional gains with skilled PT services (given medical history, behavior, limited participation and PLOF).  Do not feel patient is appropriate candidate for acute inpatient rehab services-unable to tolerate intensity, duration of services; unable to make and maintain significant functional gains that would change overall level of care.  May benefit at best from very short-term HHPT to assess home safety/home modifications if necessary and to educate all family/caregivers in formal HEP for pressure relief, tolerance to upright, flexibility/contracture prevention (may complete in hospital if family/caregivers present and  receptive). Discharge plan updated to reflect above recommendations: home with continued 24 hour sup/assist, very short-term HHPT with transition to LTC services.  Has made very minimal/no progress towards functional goals in acute-care setting; will trial 1-2 additional visits with emphasis on family/caregiver training.  If unable to progress and family consistently unavailable, will discharge acute care services, as goals best met in patient's home environment.   Follow Up Recommendations  Home health PT     Equipment Recommendations       Recommendations for Other Services       Precautions / Restrictions Precautions Precautions: Fall Precaution Comments: PEG, modified diet, behavior Restrictions Weight Bearing Restrictions: No    Mobility  Bed Mobility Overal bed mobility: Needs Assistance Bed Mobility: Rolling Rolling: Total assist         General bed mobility comments: hand-over-hand assist for R UE placement on bedrail to assist with transfer; dep for complete rotation.  Maintains R lateral cervical flexion; unable to mobilize to neutral despite repositioning  Transfers                 General transfer comment: unable/unwilling to participate  Ambulation/Gait             General Gait Details: non-ambulatory at baseline; power WC for all mobility and OOB activities   Stairs            Wheelchair Mobility    Modified Rankin (Stroke Patients Only)       Balance                                    Cognition Arousal/Alertness: Awake/alert Behavior During Therapy: Agitated Overall Cognitive Status: History of cognitive impairments - at baseline (intermittent bouts of  agitation, yelling profanity, grabbing/squeezing at therapist)                      Exercises Other Exercises Other Exercises: Act assist/passive ROM to bilat UE/LEs as able: finger flex/ext, wrist flex/ext, pronation/supination, elbow flex/ext (unable to  tolerate shoulder elevation; yelling/agitation with attempts); ankle circumduction, hip abduct/adduct, heel slides.  Patient with very minimal active movement L UE, bilat LEs; significant contracture noted L wrist/hand, bilat hips and knees.  Head maintained in position of torticollis with R lateral flexion/L rotation; unable to passively move to neutral.    General Comments        Pertinent Vitals/Pain Pain Assessment: Faces Pain Score: 0-No pain    Home Living                      Prior Function            PT Goals (current goals can now be found in the care plan section) Acute Rehab PT Goals Patient Stated Goal: patient unable to verbalize PT Goal Formulation: Patient unable to participate in goal setting Time For Goal Achievement: 02/17/15 Potential to Achieve Goals: Fair Progress towards PT goals: Progressing toward goals    Frequency  Min 2X/week    PT Plan Discharge plan needs to be updated    Co-evaluation             End of Session Equipment Utilized During Treatment: Oxygen Activity Tolerance: Treatment limited secondary to agitation Patient left: in bed;with call bell/phone within reach;with bed alarm set     Time: 9090-3014 PT Time Calculation (min) (ACUTE ONLY): 21 min  Charges:  $Therapeutic Exercise: 8-22 mins                    G Codes:      Akari Defelice H. Owens Shark, PT, DPT, NCS 02/09/2015, 3:40 PM [phone]

## 2015-02-10 ENCOUNTER — Inpatient Hospital Stay: Payer: BLUE CROSS/BLUE SHIELD

## 2015-02-10 DIAGNOSIS — G9341 Metabolic encephalopathy: Secondary | ICD-10-CM

## 2015-02-10 DIAGNOSIS — R569 Unspecified convulsions: Secondary | ICD-10-CM

## 2015-02-10 LAB — GLUCOSE, CAPILLARY
GLUCOSE-CAPILLARY: 95 mg/dL (ref 65–99)
GLUCOSE-CAPILLARY: 172 mg/dL — AB (ref 65–99)
Glucose-Capillary: 96 mg/dL (ref 65–99)
Glucose-Capillary: 89 mg/dL (ref 65–99)
Glucose-Capillary: 87 mg/dL (ref 65–99)
Glucose-Capillary: 141 mg/dL — ABNORMAL HIGH (ref 65–99)
Glucose-Capillary: 99 mg/dL (ref 65–99)

## 2015-02-10 MED ORDER — VALPROATE SODIUM 500 MG/5ML IV SOLN
500.0000 mg | Freq: Once | INTRAVENOUS | Status: DC
  Filled 2015-02-10: qty 5

## 2015-02-10 MED ORDER — PHENYTOIN SODIUM 50 MG/ML IJ SOLN
500.0000 mg | Freq: Once | INTRAMUSCULAR | Status: AC
  Administered 2015-02-10: 500 mg via INTRAVENOUS
  Filled 2015-02-10: qty 10

## 2015-02-10 MED ORDER — PHENYTOIN 50 MG PO CHEW
100.0000 mg | CHEWABLE_TABLET | Freq: Three times a day (TID) | ORAL | Status: DC
  Administered 2015-02-11: 100 mg via ORAL
  Filled 2015-02-10 (×3): qty 2

## 2015-02-10 MED ORDER — JEVITY 1.5 CAL/FIBER PO LIQD
237.0000 mL | Freq: Every day | ORAL | Status: DC
  Administered 2015-02-10 – 2015-02-14 (×19): 237 mL

## 2015-02-10 NOTE — Progress Notes (Signed)
Able to reach mother of pt by phone [phone] who states she will be in room at 12:30pm today to discuss the POC w/Dr. Graciela HusbandsKlein.

## 2015-02-10 NOTE — Consult Note (Signed)
Neurology:  S/p calling pt's mother/guardian via phone number [phone].    Discussed this use of anti epileptics with her.  In the past family has tried to use Keppra and VPA.  Dilantin was never attempted.    Plan: - will stop VPA today. VPA is and inhibitor working via p450 systerm which means it will inhibit breakdown of dilantin initially.  Therefore will not load with full dose of dilantin but will give 500 mg and start 100mg  TID.  If this fails other option would be zonegram.    Pauletta BrownsZEYLIKMAN, Paytin Ramakrishnan

## 2015-02-10 NOTE — Progress Notes (Signed)
Nutrition Follow-up       INTERVENTION:   EN: Spoke with Dr. Graciela HusbandsKlein this am via phone and agreeable to increasing tube feeding to Jevity 1.5 to 5 cans per day.  MD wanting tube feeding to meet majority of needs and po intake to be for pleasure.  Jevity 1.5 will provide 1777 kcals (96% kcals needs), 76 gm of protein (80% protein needs) and 900ml free water from tube feeding.  Continue free water flush of 300ml 4 times per day.  NUTRITION DIAGNOSIS:   Inadequate oral intake related to inability to eat as evidenced by NPO status, being addressed with tube feeding and po pleasure feeding    GOAL:   Patient will meet greater than or equal to 90% of their needs  Not meeting nutritional needs orally and with 3 cans tube feeding  MONITOR:    (Energy Intake, EN, Digestive System, Electrolyte/Renal Profile, Glucose Profile)  REASON FOR ASSESSMENT:   Consult Assessment of nutrition requirement/status  ASSESSMENT:      Neurology following, s/p EEG.     Current Nutrition: no intake this am per RN, Archie Pattenonya. Limited intake per I and O sheet since 7/10.   Urine output: 1950ml last 24 hr  Last BM: 7/12   Medications: dulcolax, colace  Electrolyte/Renal Profile and Glucose Profile:   Recent Labs Lab 02/06/15 0517 02/07/15 0657 02/08/15 0456  NA 142 141 144  K 3.9 3.5 3.5  CL 106 102 105  CO2 30 31 32  BUN 16 16 12   CREATININE 0.69 0.63 0.56*  CALCIUM 8.6* 8.2* 8.2*  GLUCOSE 112* 89 98   Protein Profile:   Recent Labs Lab 02/07/15 0657 02/08/15 0456  ALBUMIN 3.0* 3.0*     Weight Trend since Admission: Filed Weights   02/08/15 0414 02/09/15 0500 02/10/15 0500  Weight: 210 lb 3.2 oz (95.346 kg) 209 lb 8 oz (95.029 kg) 208 lb 4.8 oz (94.484 kg)      Diet Order:  DIET DYS 2 Room service appropriate?: Yes; Fluid consistency:: Honey Thick Diet - low sodium heart healthy  Skin:  Reviewed, no issues   Height:   Ht Readings from Last 1 Encounters:  01/29/15  6' (1.829 m)    Weight:   Wt Readings from Last 1 Encounters:  02/10/15 208 lb 4.8 oz (94.484 kg)       Wt Readings from Last 10 Encounters:  02/10/15 208 lb 4.8 oz (94.484 kg)  11/09/14 190 lb (86.183 kg)  02/04/12 165 lb (74.844 kg)  05/23/11 168 lb (76.204 kg)    BMI:  Body mass index is 28.24 kg/(m^2).  Estimated Nutritional Needs:   Kcal:  [id number] kcals/ (1.2 AF, 08-1.0 IF)  Protein:  92-110 g/d (1.0-1.2 g/d)  Fluid:  2300-287450ml/d (25-9730ml/kg)       HIGH Care Level Cline Draheim B. Freida BusmanAllen, RD, LDN [phone] (pager)

## 2015-02-10 NOTE — Progress Notes (Signed)
Attempted to wake pt for morning administration of medications.  Pt continued to sleep. Solution Medications were administered through tube as well as tube feed w/flush however, was unable to administer oral medications to pt.  During the tube feeding, pt woke up an opened his eyes to look around.  I tried to make eye contact with him and talk to him but pt would not engage.  Pt was awake for approx 3-4 minutes and when back to sleep.    Dr. Loretha BrasilZeylikman paged as well Dr. Graciela HusbandsKlein to give update.  Dr. Graciela HusbandsKlein returned the call and stated he would visit pt after lunch.   During my time in the room w/the pt, the mother called and spoke w/the charge nurse and requested for pt to no longer take valproic acid and depakote.  Dr. Graciela HusbandsKlein informed of this information as well.  Will try to contact mother for her to be present during Dr. [name] BoundKleins visit.  Awaiting return call from Dr. Loretha BrasilZeylikman.   Will continue to monitor.

## 2015-02-10 NOTE — Consult Note (Signed)
CC: lethargy  HPI: [name] [name] is an 29 y.o. 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 Overnight pt had period of hypotension and lethargy.  Past Medical History  Diagnosis Date  . Hepatitis C   . Scoliosis   . 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 Subarachnoid hemorrhage   . Pulmonary emboli   . Hepatitis C   . Depression     Past Surgical History  Procedure Laterality Date  . Fracture surgery      left acetabulum  . Ileostomy    . Lung tube    . Ankle surgery Bilateral   . Tracheostomy    . Peg tube placement      Family History  Problem Relation Age of Onset  . Hypertension      Social History:  reports that he quit smoking about 3 years ago. He has never used smokeless tobacco. He reports that he does not drink alcohol or use illicit drugs.  Allergies  Allergen Reactions  . Ambien [Zolpidem] Other (See Comments)    Patient start hallucinating.   . Ativan [Lorazepam] Itching and Other (See Comments)    Patient gets hot and sweaty. Hallucinations   . Depakote Er [Divalproex Sodium Er] Itching and Other (See Comments)    Patient gets hot and sweaty.  . Dilaudid [Hydromorphone] Hives and Other (See Comments)    Patient gets hot and sweaty.  Marland Kitchen Keppra [Levetiracetam] Itching and Other (See Comments)    Patient gets hot and sweaty.    Medications: I have reviewed the patient's current medications.  ROS: Unable to obtain due to poor verbal output  Physical Examination: Blood pressure 125/70, pulse 64, temperature 98.2 F (36.8 C), temperature source Oral, resp. rate 16, height 6' (1.829 m), weight 94.484 kg (208 lb 4.8 oz), SpO2 100 %.  Pt is trying to state his name but increased moaning.   Slight movement of RUE  Follows simple commands by closing his eyes and sticking out his tongue No movement LUE and b/l LE which is chronic.    Laboratory Studies:    Basic Metabolic Panel:  Recent Labs Lab 02/04/15 0728 02/06/15 0517 02/07/15 0657 02/08/15 0456  NA 143 142 141 144  K 3.6 3.9 3.5 3.5  CL 106 106 102 105  CO2 32  GLUCOSE 96 112* 89 98  BUN CREATININE 0.61 0.69 0.63 0.56*  CALCIUM 8.4* 8.6* 8.2* 8.2*    Liver Function Tests:  Recent Labs Lab 02/07/15 0657 02/08/15 0456  AST 27 31  ALT 47 57  ALKPHOS 56 59  BILITOT 0.5 0.5  PROT 5.9* 6.1*  ALBUMIN 3.0* 3.0*   No results for input(s): LIPASE, AMYLASE in the last 168 hours. No results for input(s): AMMONIA in the last 168 hours.  CBC:  Recent Labs Lab 02/06/15 0517 02/07/15 0657 02/08/15 0456  WBC 6.4 8.1 8.3  NEUTROABS 3.5 5.2  --   HGB 11.1* 11.3* 11.1*  HCT 34.5* 34.1* 34.7*  MCV 81.3 81.5 82.2  PLT 246 225 227    Cardiac Enzymes: No results for input(s): CKTOTAL, CKMB, CKMBINDEX, TROPONINI in the last 168 hours.  BNP: Invalid input(s): POCBNP  CBG:  Recent Labs Lab 02/09/15 1647 02/09/15 2116 02/10/15 0028 02/10/15 0438 02/10/15 0746  GLUCAP 89 89 96 95 87    Microbiology: Results for orders placed or performed during the  hospital encounter of 01/29/15  Blood culture (routine x 2)     Status: None   Collection Time: 01/29/15  1:10 PM  Result Value Ref Range Status   Specimen Description BLOOD  Final   Special Requests NONE  Final   Culture NO GROWTH 5 DAYS  Final   Report Status 02/03/2015 FINAL  Final  Blood culture (routine x 2)     Status: None   Collection Time: 01/29/15  1:26 PM  Result Value Ref Range Status   Specimen Description BLOOD  Final   Special Requests NONE  Final   Culture NO GROWTH 5 DAYS  Final   Report Status 02/03/2015 FINAL  Final  MRSA PCR Screening     Status: Abnormal   Collection Time: 01/29/15  4:02 PM  Result Value Ref Range Status   MRSA by PCR POSITIVE (A) NEGATIVE Final    Comment:        The GeneXpert MRSA Assay (FDA approved for NASAL specimens only), is one component  of a comprehensive MRSA colonization surveillance program. It is not intended to diagnose MRSA infection nor to guide or monitor treatment for MRSA infections. CRITICAL RESULT CALLED TO, READ BACK BY AND VERIFIED WITH: CHERYL SMITH ON 01/29/15 AT 1750 BY JEF     Coagulation Studies: No results for input(s): LABPROT, INR in the last 72 hours.  Urinalysis:   Recent Labs Lab 02/07/15 1858  COLORURINE YELLOW*  LABSPEC 1.004*  PHURINE 7.0  GLUCOSEU NEGATIVE  HGBUR NEGATIVE  BILIRUBINUR NEGATIVE  KETONESUR NEGATIVE  PROTEINUR NEGATIVE  NITRITE NEGATIVE  LEUKOCYTESUR NEGATIVE    Lipid Panel:  No results found for: CHOL, TRIG, HDL, CHOLHDL, VLDL, LDLCALC  HgbA1C: No results found for: HGBA1C  Urine Drug Screen:  No results found for: LABOPIA, COCAINSCRNUR, LABBENZ, AMPHETMU, THCU, LABBARB  Alcohol Level: No results for input(s): ETH in the last 168 hours.  Other results: EKG: normal EKG, normal sinus rhythm, unchanged from previous tracings.  Imaging: Dg Chest 2 View  02/10/2015   CLINICAL DATA:  Acute respiratory failure .  EXAM: CHEST  2 VIEW  COMPARISON:  02/06/2015 .  FINDINGS: Mediastinum hilar structures normal. Mild left base subsegmental atelectasis and/or infiltrates again noted. No interim change. Small left pleural effusion. Heart size stable. Pneumothorax . No acute bony abnormality. IVC filter noted.  IMPRESSION: Mild left base subsegmental atelectasis and or infiltrate. No interim change from 02/06/2015 .   Electronically Signed   By: Maisie Fushomas  Register   On: 02/10/2015 09:22     Assessment/Plan:  [name] [name] is an 29 y.o. 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  Sleepy AM, not family bedside.   - EEG reviewed with no seizures but epileptiform discharges L temporal lobe.   - s/p 500 IV VPA given. Will check level in AM - CXR pending.      Pauletta BrownsZEYLIKMAN,  Jannatul Wojdyla

## 2015-02-10 NOTE — Progress Notes (Signed)
Patient woke up as I was attempting to put his bipap on. Patient said "NO" and gritted his teeth. Placed him back on nasal o2. Reported findings to RN

## 2015-02-10 NOTE — Progress Notes (Signed)
Patient ID: [name] [name], male   DOB: 02/05/1986, 29 y.o.   MRN: [id number] SUBJECTIVE:  Admitted with aspiration pneumonitis and acute resp failure; films with LLL infiltrate.  Pt with h/o TBI with quadriplegia, s/p PEG.  Has had worsening lethargy/metabolic encephalopathy.  Went for EEG yesterday; read yesterday confirms epileptiform discharges despite current medication.  Neurology following.  Refused CPAP last PM, per nursing.  Opens eyes this AM and attends to examiner, but not interacting, similar to last several mornings.  No fever, chills.  No family at bedside currently.  Nursing reports no events overnight.  ______________________________________________________________________  ROS: Please see HPI; unable to obtain any history from pt   Past Medical History  Diagnosis Date  . Hepatitis C   . Scoliosis   . 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 Subarachnoid hemorrhage   . Pulmonary emboli   . Hepatitis C   . Depression     Past Surgical History  Procedure Laterality Date  . Fracture surgery      left acetabulum  . Ileostomy    . Lung tube    . Ankle surgery Bilateral   . Tracheostomy    . Peg tube placement       Current facility-administered medications:  .  acetaminophen (TYLENOL) tablet 650 mg, 650 mg, Oral, Q6H PRN **OR** acetaminophen (TYLENOL) suppository 650 mg, 650 mg, Rectal, Q6H PRN, Auburn BilberryShreyang Patel, MD .  albuterol (PROVENTIL) (2.5 MG/3ML) 0.083% nebulizer solution 3 mL, 3 mL, Inhalation, Q4H PRN, Auburn BilberryShreyang Patel, MD, 3 mL at 02/06/15 1521 .  antiseptic oral rinse (CPC / CETYLPYRIDINIUM CHLORIDE 0.05%) solution 7 mL, 7 mL, Mouth Rinse, q12n4p, Auburn BilberryShreyang Patel, MD, 7 mL at 02/09/15 1703 .  apixaban (ELIQUIS) tablet 5 mg, 5 mg, Oral, BID, Auburn BilberryShreyang Patel, MD, 5 mg at 02/09/15 2140 .  baclofen (LIORESAL) tablet 20 mg, 20 mg, Oral, TID, Auburn BilberryShreyang Patel, MD, 20 mg at 02/09/15 2141 .  bisacodyl (DULCOLAX) suppository 10 mg, 10 mg, Rectal, QHS, Clydie Braunavid Fitzgerald, MD, 10  mg at 02/09/15 2131 .  cefUROXime (CEFTIN) 250 MG/5ML suspension 250 mg, 250 mg, Per Tube, Q12H, Curtis SitesBert J Klein III, MD, 250 mg at 02/09/15 2140 .  chlorhexidine (PERIDEX) 0.12 % solution 15 mL, 15 mL, Mouth Rinse, BID, Auburn BilberryShreyang Patel, MD, 15 mL at 02/09/15 2143 .  docusate sodium (COLACE) capsule 100 mg, 100 mg, Oral, BID, Auburn BilberryShreyang Patel, MD, 100 mg at 02/09/15 2141 .  feeding supplement (JEVITY 1.5 CAL/FIBER) liquid 237 mL, 237 mL, Per Tube, TID BM, Clydie Braunavid Fitzgerald, MD, 237 mL at 02/09/15 2140 .  free water 300 mL, 300 mL, Per Tube, 4 times per day, Clydie Braunavid Fitzgerald, MD, 300 mL at 02/10/15 0600 .  ipratropium-albuterol (DUONEB) 0.5-2.5 (3) MG/3ML nebulizer solution 3 mL, 3 mL, Nebulization, Q6H, Curtis SitesBert J Klein III, MD, 3 mL at 02/10/15 0740 .  lacosamide (VIMPAT) tablet 200 mg, 200 mg, Oral, BID, Auburn BilberryShreyang Patel, MD, 200 mg at 02/09/15 2140 .  multivitamin with minerals tablet 1 tablet, 1 tablet, Oral, Daily, Auburn BilberryShreyang Patel, MD, 1 tablet at 02/09/15 1103 .  OLANZapine (ZYPREXA) tablet 15 mg, 15 mg, Oral, QHS, Auburn BilberryShreyang Patel, MD, 15 mg at 02/09/15 2141 .  ondansetron (ZOFRAN) tablet 4 mg, 4 mg, Oral, Q6H PRN **OR** ondansetron (ZOFRAN) injection 4 mg, 4 mg, Intravenous, Q6H PRN, Auburn BilberryShreyang Patel, MD .  pantoprazole sodium (PROTONIX) 40 mg/20 mL oral suspension 40 mg, 40 mg, Per Tube, BID, Danella PentonMark F Miller, MD, 40 mg at 02/09/15 2140 .  sulfamethoxazole-trimethoprim (BACTRIM,SEPTRA)  200-40 MG/5ML suspension 20 mL, 20 mL, Per Tube, Q12H, Curtis Sites III, MD, 20 mL at 02/09/15 2141 .  [COMPLETED] Valproic Acid (DEPAKENE) 250 MG/5ML syrup SYRP 1,000 mg, 1,000 mg, Per Tube, Once, 1,000 mg at 02/08/15 1359 **FOLLOWED BY** Valproic Acid (DEPAKENE) 250 MG/5ML syrup SYRP 500 mg, 500 mg, Per Tube, BID, Pauletta Browns, MD, 500 mg at 02/09/15 2140  PHYSICAL EXAM:  BP 125/70 mmHg  Pulse 64  Temp(Src) 98.2 F (36.8 C) (Oral)  Resp 16  Ht 6' (1.829 m)  Wt 94.484 kg (208 lb 4.8 oz)  BMI 28.24 kg/m2  SpO2  100%  General: Well developed, well nourished male, opens eyes and looks at examiner, but otherwise not interacting HEENT: PERRL; post op changes;OP slightly dry without lesions. Neck: increased tension, trachea midline, no thyromegaly Chest: normal to palpation Lungs: no retractions or wheezes; few crackles Cardiovascular: RRR, distant; distal pulses 2+ Abdomen: soft, nontender, nondistended, positive bowel sounds.  PEG in place.  Extremities: increased spasticity throughout; no edema Derm: no significant rashes or nodules; good skin turgor Lymph: no cervical or supraclavicular lymphadenopathy  Labs and imaging studies were reviewed  ASSESSMENT/PLAN:   1. Aspiration pneumonia/acute resp failure- resp status improving; repeat CXR today. Have encouraged use of CPAP at night, with which he is variably compliant. On ceftin for aspiration and septra for MRSA screen positive.  Encouraged only to be fed PO if pt is completely awake 2. Traumatic brain injury history/tetraplegia/metabolic encephalopathy- appreciate Neurology consultation.  CT without acute change. Appears to be having recurrent seizures by family description and by EEG finding; await further medication adjustment by Neurology 3. Hypotension- off propranolol; BP improved.      4. D/c plan- has been turned down for inpt rehab.

## 2015-02-10 NOTE — Progress Notes (Signed)
Pt mother requested for Baclofen pump to be filled.  Pt mother gave me the contact information to call Dr. Pennelope BrackenBrien's office.  [facility] (Dr. Bernestine Amass'briens office) who stated that the "alarm date" for the Baclofen pump was not until 02/23/15 so "we have some time before a re-fill."  Herbert SetaHeather will inform Dr. Velda Shell'brien's PA of the re-fill in case pt d/c date is after the 19th or close to the 28th.  Will continue to monitor and notify night RN of situation.    [facility].  Will continue to monitor pt d/c date and advise mother of Baclofen pump re-fill.  Pending pt d/c date mother may have to pick-up re-fill and bring in.   Dr Alois Cliche'brien [facility] [phone]  Pt mother also gave contact information for [facility] Jul[facility]Perkins [facility] [phone].  Dr. Loretha BrasilZeylikman was paged to inform of this contact--awaiting return call.

## 2015-02-11 DIAGNOSIS — R561 Post traumatic seizures: Secondary | ICD-10-CM

## 2015-02-11 LAB — GLUCOSE, CAPILLARY
GLUCOSE-CAPILLARY: 89 mg/dL (ref 65–99)
Glucose-Capillary: 96 mg/dL (ref 65–99)
Glucose-Capillary: 104 mg/dL — ABNORMAL HIGH (ref 65–99)

## 2015-02-11 LAB — COMPREHENSIVE METABOLIC PANEL
ALBUMIN: 3.3 g/dL — AB (ref 3.5–5.0)
ALT: 30 U/L (ref 17–63)
AST: 15 U/L (ref 15–41)
Alkaline Phosphatase: 70 U/L (ref 38–126)
Anion gap: 9 (ref 5–15)
BILIRUBIN TOTAL: 0.3 mg/dL (ref 0.3–1.2)
BUN: 12 mg/dL (ref 6–20)
CHLORIDE: 104 mmol/L (ref 101–111)
CO2: 30 mmol/L (ref 22–32)
Calcium: 8.8 mg/dL — ABNORMAL LOW (ref 8.9–10.3)
Creatinine, Ser: 0.6 mg/dL — ABNORMAL LOW (ref 0.61–1.24)
Glucose, Bld: 89 mg/dL (ref 65–99)
POTASSIUM: 4.1 mmol/L (ref 3.5–5.1)
SODIUM: 143 mmol/L (ref 135–145)
TOTAL PROTEIN: 7.3 g/dL (ref 6.5–8.1)

## 2015-02-11 LAB — VALPROIC ACID LEVEL: VALPROIC ACID LVL: 14 ug/mL — AB (ref 50.0–100.0)

## 2015-02-11 LAB — CBC
HCT: 37.2 % — ABNORMAL LOW (ref 40.0–52.0)
Hemoglobin: 12.1 g/dL — ABNORMAL LOW (ref 13.0–18.0)
MCH: 26.3 pg (ref 26.0–34.0)
MCHC: 32.5 g/dL (ref 32.0–36.0)
MCV: 81.1 fL (ref 80.0–100.0)
PLATELETS: 244 10*3/uL (ref 150–440)
RBC: 4.58 MIL/uL (ref 4.40–5.90)
RDW: 16.8 % — ABNORMAL HIGH (ref 11.5–14.5)
WBC: 7.4 10*3/uL (ref 3.8–10.6)

## 2015-02-11 NOTE — Progress Notes (Signed)
Nutrition Follow-up       INTERVENTION:   EN: Continue Jevity 1.5 5 cans per day to meet nutritional needs and po diet for pleasure feeding.    NUTRITION DIAGNOSIS:   Inadequate oral intake related to inability to eat as evidenced by NPO status, being addressed with tube feeding and po pleasure feeding    GOAL:   Patient will meet greater than or equal to 90% of their needs    MONITOR:    (Energy Intake, EN, Digestive System, Electrolyte/Renal Profile, Glucose Profile)  REASON FOR ASSESSMENT:   Consult Assessment of nutrition requirement/status  ASSESSMENT:     EN: Tolerating jevity 1.5 at this time, noted 4 cans given yesterday, 1 can given so far this am per order.    Current Nutrition: no intake this am per RN, Gladstone LighterAlecia.  Per I and O sheet ate 50% of lunch and 75% supper meals   Gastrointestinal Profile: +bowel sounds and gas noted per chart Last BM: 7/13   Medications: reviewed  Electrolyte/Renal Profile and Glucose Profile:   Recent Labs Lab 02/07/15 0657 02/08/15 0456 02/11/15 0801  NA 141 144 143  K 3.5 3.5 4.1  CL 102 105 104  CO2 31 32 30  BUN 16 12 12   CREATININE 0.63 0.56* 0.60*  CALCIUM 8.2* 8.2* 8.8*  GLUCOSE 89 98 89   Protein Profile:   Recent Labs Lab 02/07/15 0657 02/08/15 0456 02/11/15 0801  ALBUMIN 3.0* 3.0* 3.3*      Weight Trend since Admission: Filed Weights   02/09/15 0500 02/10/15 0500 02/11/15 0348  Weight: 209 lb 8 oz (95.029 kg) 208 lb 4.8 oz (94.484 kg) 203 lb 3.2 oz (92.171 kg)       Diet Order:  DIET DYS 2 Room service appropriate?: Yes; Fluid consistency:: Honey Thick Diet - low sodium heart healthy  Skin:  Reviewed, no issues   Height:   Ht Readings from Last 1 Encounters:  01/29/15 6' (1.829 m)    Weight:   Wt Readings from Last 1 Encounters:  02/11/15 203 lb 3.2 oz (92.171 kg)    Ideal Body Weight:     Wt Readings from Last 10 Encounters:  02/11/15 203 lb 3.2 oz (92.171 kg)   11/09/14 190 lb (86.183 kg)  02/04/12 165 lb (74.844 kg)  05/23/11 168 lb (76.204 kg)    BMI:  Body mass index is 27.55 kg/(m^2).  Estimated Nutritional Needs:   Kcal:  [id number] kcals/ (1.2 AF, 08-1.0 IF)  Protein:  92-110 g/d (1.0-1.2 g/d)  Fluid:  2300-283550ml/d (25-6230ml/kg)   EDUCATION NEEDS:   No education needs identified at this time  MODERATE Care Level  Jaquel Coomer B. Freida BusmanAllen, RD, LDN [phone] (pager)

## 2015-02-11 NOTE — Progress Notes (Signed)
Patient ID: [name] [name], male   DOB: October 26, 1985, 29 y.o.   MRN: [id number] Patient ID: [name] [name], male   DOB: Sep 27, 1985, 29 y.o.   MRN: [id number] SUBJECTIVE:  Admitted with aspiration pneumonitis and acute resp failure; films with LLL infiltrate.  Pt with h/o TBI with quadriplegia, s/p PEG.  Has had worsening lethargy/metabolic encephalopathy.  Went for EEG , confirms epileptiform discharges despite current medication.  Neurology note reviewed.  Hard to arouse  No fever, chills.  No family at bedside currently.  Nursing reports no events overnight.  ______________________________________________________________________  ROS: Please see HPI; unable to obtain any history from pt   Past Medical History  Diagnosis Date  . Hepatitis C   . Scoliosis   . 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 Subarachnoid hemorrhage   . Pulmonary emboli   . Hepatitis C   . Depression     Past Surgical History  Procedure Laterality Date  . Fracture surgery      left acetabulum  . Ileostomy    . Lung tube    . Ankle surgery Bilateral   . Tracheostomy    . Peg tube placement       Current facility-administered medications:  .  acetaminophen (TYLENOL) tablet 650 mg, 650 mg, Oral, Q6H PRN **OR** acetaminophen (TYLENOL) suppository 650 mg, 650 mg, Rectal, Q6H PRN, [name], MD .  albuterol (PROVENTIL) (2.5 MG/3ML) 0.083% nebulizer solution 3 mL, 3 mL, Inhalation, Q4H PRN, [name], MD, 3 mL at 02/06/15 1521 .  antiseptic oral rinse (CPC / CETYLPYRIDINIUM CHLORIDE 0.05%) solution 7 mL, 7 mL, Mouth Rinse, q12n4p, [name], MD, 7 mL at 02/09/15 1703 .  apixaban (ELIQUIS) tablet 5 mg, 5 mg, Oral, BID, [name], MD, 5 mg at 02/10/15 2344 .  baclofen (LIORESAL) tablet 20 mg, 20 mg, Oral, TID, [name], MD, 20 mg at 02/10/15 2343 .  bisacodyl (DULCOLAX) suppository 10 mg, 10 mg, Rectal, QHS, [name], MD, 10 mg at 02/09/15 2131 .  cefUROXime (CEFTIN) 250 MG/5ML suspension  250 mg, 250 mg, Per Tube, Q12H, Curtis Sites III, MD, 250 mg at 02/10/15 2345 .  chlorhexidine (PERIDEX) 0.12 % solution 15 mL, 15 mL, Mouth Rinse, BID, [name], MD, 15 mL at 02/10/15 2000 .  docusate sodium (COLACE) capsule 100 mg, 100 mg, Oral, BID, [name], MD, 100 mg at 02/10/15 2344 .  feeding supplement (JEVITY 1.5 CAL/FIBER) liquid 237 mL, 237 mL, Per Tube, 5 X Daily, Curtis Sites III, MD, 237 mL at 02/11/15 [phone]  free water 300 mL, 300 mL, Per Tube, 4 times per day, [name], MD, 300 mL at 02/11/15 0000 .  ipratropium-albuterol (DUONEB) 0.5-2.5 (3) MG/3ML nebulizer solution 3 mL, 3 mL, Nebulization, Q6H, Curtis Sites III, MD, 3 mL at 02/11/15 0725 .  lacosamide (VIMPAT) tablet 200 mg, 200 mg, Oral, BID, [name], MD, 200 mg at 02/10/15 2344 .  multivitamin with minerals tablet 1 tablet, 1 tablet, Oral, Daily, [name], MD, 1 tablet at 02/09/15 1103 .  OLANZapine (ZYPREXA) tablet 15 mg, 15 mg, Oral, QHS, [name], MD, 15 mg at 02/10/15 2344 .  ondansetron (ZOFRAN) tablet 4 mg, 4 mg, Oral, Q6H PRN **OR** ondansetron (ZOFRAN) injection 4 mg, 4 mg, Intravenous, Q6H PRN, [name], MD .  pantoprazole sodium (PROTONIX) 40 mg/20 mL oral suspension 40 mg, 40 mg, Per Tube, BID, [name], MD, 40 mg at 02/10/15 2344 .  phenytoin (DILANTIN) chewable tablet 100  mg, 100 mg, Oral, TID, Pauletta BrownsYuriy Zeylikman, MD .  sulfamethoxazole-trimethoprim (BACTRIM,SEPTRA) 200-40 MG/5ML suspension 20 mL, 20 mL, Per Tube, Q12H, Curtis SitesBert J Klein III, MD, 20 mL at 02/10/15 2349  PHYSICAL EXAM:  BP 104/59 mmHg  Pulse 66  Temp(Src) 97.5 F (36.4 C) (Oral)  Resp 17  Ht 6' (1.829 m)  Wt 92.171 kg (203 lb 3.2 oz)  BMI 27.55 kg/m2  SpO2 98%  General: Well developed, well nourished male, opens eyes and looks at examiner, but otherwise not interacting HEENT: PERRL; post op changes;OP slightly dry without lesions. Neck: increased tension, trachea midline, no thyromegaly Chest:  normal to palpation Lungs: no retractions or wheezes; few crackles Cardiovascular: RRR, distant; distal pulses 2+ Abdomen: soft, nontender, nondistended, positive bowel sounds.  PEG in place.  Extremities: increased spasticity throughout; no edema Derm: no significant rashes or nodules; good skin turgor Lymph: no cervical or supraclavicular lymphadenopathy  Labs and imaging studies were reviewed  ASSESSMENT/PLAN:   1. Aspiration pneumonia/acute resp failure- resp status improving; On ceftin for aspiration and septra for MRSA screen positive.  Encouraged only to be fed PO if pt is completely awake 2. Traumatic brain injury history/tetraplegia/metabolic encephalopathy-   CT without acute change. Appears to be having recurrent seizures by family description and by EEG finding;tensition to Dilantin as per neurology. 3. Hypotension- off propranolol; BP improved.      4. D/c plan- has been turned down for inpt rehab.

## 2015-02-11 NOTE — Progress Notes (Signed)
Notified Dr Dan HumphreysWalker regarding pt's drowsiness and family's wondering if related to new medication for seizures. Pt now on vimpat and dilantin. MD acknowledged. RN will continue to assess and will follow up with neurology MD.

## 2015-02-11 NOTE — Progress Notes (Signed)
D/c dilantin due to pt drowsiness per Dr. Loretha BrasilZeylikman. Dr. Loretha BrasilZeylikman will follow-up with the pt Monday.

## 2015-02-11 NOTE — Consult Note (Signed)
CC: lethargy  HPI: [name] [name] is an 29 y.o. 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  No issues overnight.    Past Medical History  Diagnosis Date  . Hepatitis C   . Scoliosis   . 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 Subarachnoid hemorrhage   . Pulmonary emboli   . Hepatitis C   . Depression     Past Surgical History  Procedure Laterality Date  . Fracture surgery      left acetabulum  . Ileostomy    . Lung tube    . Ankle surgery Bilateral   . Tracheostomy    . Peg tube placement      Family History  Problem Relation Age of Onset  . Hypertension      Social History:  reports that he quit smoking about 3 years ago. He has never used smokeless tobacco. He reports that he does not drink alcohol or use illicit drugs.  Allergies  Allergen Reactions  . Ambien [Zolpidem] Other (See Comments)    Patient start hallucinating.   . Ativan [Lorazepam] Itching and Other (See Comments)    Patient gets hot and sweaty. Hallucinations   . Depakote Er [Divalproex Sodium Er] Itching and Other (See Comments)    Patient gets hot and sweaty.  . Dilaudid [Hydromorphone] Hives and Other (See Comments)    Patient gets hot and sweaty.  Marland Kitchen Keppra [Levetiracetam] Itching and Other (See Comments)    Patient gets hot and sweaty.    Medications: I have reviewed the patient's current medications.  ROS: Unable to obtain due to poor verbal output  Physical Examination: Blood pressure 104/59, pulse 66, temperature 97.5 F (36.4 C), temperature source Oral, resp. rate 16, height 6' (1.829 m), weight 92.171 kg (203 lb 3.2 oz), SpO2 98 %.  Pt is trying to state his name but increased moaning.   Slight movement of RUE  Follows simple commands by closing his eyes and sticking out his tongue No movement LUE and b/l LE which is chronic.    Laboratory Studies:   Basic Metabolic  Panel:  Recent Labs Lab 02/06/15 0517 02/07/15 0657 02/08/15 0456 02/11/15 0801  NA 142 141 144 143  K 3.9 3.5 3.5 4.1  CL 106 102 105 104  CO2 30 31 32 30  GLUCOSE 112* 89 98 89  BUN CREATININE 0.69 0.63 0.56* 0.60*  CALCIUM 8.6* 8.2* 8.2* 8.8*    Liver Function Tests:  Recent Labs Lab 02/07/15 0657 02/08/15 0456 02/11/15 0801  AST ALT 47 57 30  ALKPHOS 56 59 70  BILITOT 0.5 0.5 0.3  PROT 5.9* 6.1* 7.3  ALBUMIN 3.0* 3.0* 3.3*   No results for input(s): LIPASE, AMYLASE in the last 168 hours. No results for input(s): AMMONIA in the last 168 hours.  CBC:  Recent Labs Lab 02/06/15 0517 02/07/15 0657 02/08/15 0456 02/11/15 0801  WBC 6.4 8.1 8.3 7.4  NEUTROABS 3.5 5.2  --   --   HGB 11.1* 11.3* 11.1* 12.1*  HCT 34.5* 34.1* 34.7* 37.2*  MCV 81.3 81.5 82.2 81.1  PLT 246 225 227 244    Cardiac Enzymes: No results for input(s): CKTOTAL, CKMB, CKMBINDEX, TROPONINI in the last 168 hours.  BNP: Invalid input(s): POCBNP  CBG:  Recent Labs Lab 02/10/15 1136 02/10/15 1653 02/10/15 2137 02/11/15 0724 02/11/15 1208  GLUCAP 172*  141* 99 89 96    Microbiology: Results for orders placed or performed during the hospital encounter of 01/29/15  Blood culture (routine x 2)     Status: None   Collection Time: 01/29/15  1:10 PM  Result Value Ref Range Status   Specimen Description BLOOD  Final   Special Requests NONE  Final   Culture NO GROWTH 5 DAYS  Final   Report Status 02/03/2015 FINAL  Final  Blood culture (routine x 2)     Status: None   Collection Time: 01/29/15  1:26 PM  Result Value Ref Range Status   Specimen Description BLOOD  Final   Special Requests NONE  Final   Culture NO GROWTH 5 DAYS  Final   Report Status 02/03/2015 FINAL  Final  MRSA PCR Screening     Status: Abnormal   Collection Time: 01/29/15  4:02 PM  Result Value Ref Range Status   MRSA by PCR POSITIVE (A) NEGATIVE Final    Comment:        The GeneXpert  MRSA Assay (FDA approved for NASAL specimens only), is one component of a comprehensive MRSA colonization surveillance program. It is not intended to diagnose MRSA infection nor to guide or monitor treatment for MRSA infections. CRITICAL RESULT CALLED TO, READ BACK BY AND VERIFIED WITH: CHERYL SMITH ON 01/29/15 AT 1750 BY JEF     Coagulation Studies: No results for input(s): LABPROT, INR in the last 72 hours.  Urinalysis:   Recent Labs Lab 02/07/15 1858  COLORURINE YELLOW*  LABSPEC 1.004*  PHURINE 7.0  GLUCOSEU NEGATIVE  HGBUR NEGATIVE  BILIRUBINUR NEGATIVE  KETONESUR NEGATIVE  PROTEINUR NEGATIVE  NITRITE NEGATIVE  LEUKOCYTESUR NEGATIVE    Lipid Panel:  No results found for: CHOL, TRIG, HDL, CHOLHDL, VLDL, LDLCALC  HgbA1C: No results found for: HGBA1C  Urine Drug Screen:  No results found for: LABOPIA, COCAINSCRNUR, LABBENZ, AMPHETMU, THCU, LABBARB  Alcohol Level: No results for input(s): ETH in the last 168 hours.  Other results: EKG: normal EKG, normal sinus rhythm, unchanged from previous tracings.  Imaging: Dg Chest 2 View  02/10/2015   ADDENDUM REPORT: 02/10/2015 12:12  ADDENDUM: In the body of the report it should read :  No pneumothorax.   Electronically Signed   By: Maisie Fushomas  Register   On: 02/10/2015 12:12   02/10/2015   CLINICAL DATA:  Acute respiratory failure .  EXAM: CHEST  2 VIEW  COMPARISON:  02/06/2015 .  FINDINGS: Mediastinum hilar structures normal. Mild left base subsegmental atelectasis and/or infiltrates again noted. No interim change. Small left pleural effusion. Heart size stable. Pneumothorax . No acute bony abnormality. IVC filter noted.  IMPRESSION: Mild left base subsegmental atelectasis and or infiltrate. No interim change from 02/06/2015 .  Electronically Signed: By: Maisie Fushomas  Register On: 02/10/2015 09:22     Assessment/Plan:  [name] [name] is an 29 y.o. 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 Pt's sister at bedside, states it is common for him to sleep through early afternoon S/p d/c VPA and slowly started dilantin.      [name] [name], Markeem Noreen

## 2015-02-11 NOTE — Progress Notes (Signed)
Home health sitter at the bedside. Peg site dressing changed, and reinforced. Yellowish drainage noted moderate amount noted. Skin at peg site red, excoriation noted.  Pt continues to tolerate dysphagia II diet without difficulty. HOB up at 90 angle and check patients mouth for pocketing. Oral care given, pt does not like and resists oral care at times. Encouragement needed. Repositioned pt q2 hours and upon pt request. Will f/u with dayshift RN regarding skin concern at peg site location.

## 2015-02-12 LAB — GLUCOSE, CAPILLARY
Glucose-Capillary: 173 mg/dL — ABNORMAL HIGH (ref 65–99)
Glucose-Capillary: 186 mg/dL — ABNORMAL HIGH (ref 65–99)
Glucose-Capillary: 226 mg/dL — ABNORMAL HIGH (ref 65–99)

## 2015-02-12 MED ORDER — NYSTATIN 100000 UNIT/GM EX POWD
Freq: Three times a day (TID) | CUTANEOUS | Status: DC
  Administered 2015-02-12 – 2015-02-14 (×6): via TOPICAL
  Filled 2015-02-12: qty 15

## 2015-02-12 NOTE — Progress Notes (Signed)
Patient Alert to voice. Patient skin around the peg tube is noted redness and irritation. Clean site and administered dressing. Dressing signed and dated. There is noted skin (LUE) breakdown between the 4th and 5th., brace was taken off. Tolerated meds and meals well. Patient was bath. Ex large BM. Turned every 2 hours, continuous tried prop his head appropriately. Staff will continue to monitor and meet needs. And maintain safety.

## 2015-02-12 NOTE — Progress Notes (Signed)
Patient ID: [name] [name], male   DOB: August 11, 1985, 29 y.o.   MRN: [id number] Patient ID: [name] [name], male   DOB: [birth year], 29 y.o.   MRN: [id number] Patient ID: [name] [name], male   DOB: 14-Mar-1986, 29 y.o.   MRN: [id number] SUBJECTIVE:  Admitted with aspiration pneumonitis and acute resp failure; films with LLL infiltrate.  Pt with h/o TBI with quadriplegia, s/p PEG.  Has had worsening lethargy/metabolic encephalopathy.  Went for EEG , confirms epileptiform discharges despite current medication.  Neurology note reviewed.  More alert this AM. No family at bedside currently.  Nursing reports no events overnight.  ______________________________________________________________________  ROS: Please see HPI; unable to obtain any history from pt   Past Medical History  Diagnosis Date  . Hepatitis C   . Scoliosis   . 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 Subarachnoid hemorrhage   . Pulmonary emboli   . Hepatitis C   . Depression     Past Surgical History  Procedure Laterality Date  . Fracture surgery      left acetabulum  . Ileostomy    . Lung tube    . Ankle surgery Bilateral   . Tracheostomy    . Peg tube placement       Current facility-administered medications:  .  acetaminophen (TYLENOL) tablet 650 mg, 650 mg, Oral, Q6H PRN **OR** acetaminophen (TYLENOL) suppository 650 mg, 650 mg, Rectal, Q6H PRN, [name], MD .  albuterol (PROVENTIL) (2.5 MG/3ML) 0.083% nebulizer solution 3 mL, 3 mL, Inhalation, Q4H PRN, [name], MD, 3 mL at 02/06/15 1521 .  antiseptic oral rinse (CPC / CETYLPYRIDINIUM CHLORIDE 0.05%) solution 7 mL, 7 mL, Mouth Rinse, q12n4p, [name], MD, 7 mL at 02/11/15 1600 .  apixaban (ELIQUIS) tablet 5 mg, 5 mg, Oral, BID, [name], MD, 5 mg at 02/11/15 2110 .  baclofen (LIORESAL) tablet 20 mg, 20 mg, Oral, TID, [name], MD, 20 mg at 02/11/15 2109 .  bisacodyl (DULCOLAX) suppository 10 mg, 10 mg, Rectal, QHS, [name], MD, 10 mg at  02/11/15 2058 .  cefUROXime (CEFTIN) 250 MG/5ML suspension 250 mg, 250 mg, Per Tube, Q12H, Curtis Sites III, MD, 250 mg at 02/11/15 2204 .  chlorhexidine (PERIDEX) 0.12 % solution 15 mL, 15 mL, Mouth Rinse, BID, [name], MD, 15 mL at 02/11/15 2056 .  docusate sodium (COLACE) capsule 100 mg, 100 mg, Oral, BID, [name], MD, 100 mg at 02/11/15 2110 .  feeding supplement (JEVITY 1.5 CAL/FIBER) liquid 237 mL, 237 mL, Per Tube, 5 X Daily, Curtis Sites III, MD, 237 mL at 02/12/15 0618 .  free water 300 mL, 300 mL, Per Tube, 4 times per day, [name], MD, 300 mL at 02/12/15 0600 .  ipratropium-albuterol (DUONEB) 0.5-2.5 (3) MG/3ML nebulizer solution 3 mL, 3 mL, Nebulization, Q6H, Curtis Sites III, MD, 3 mL at 02/12/15 0737 .  lacosamide (VIMPAT) tablet 200 mg, 200 mg, Oral, BID, [name], MD, 200 mg at 02/11/15 2110 .  multivitamin with minerals tablet 1 tablet, 1 tablet, Oral, Daily, [name], MD, 1 tablet at 02/09/15 1103 .  OLANZapine (ZYPREXA) tablet 15 mg, 15 mg, Oral, QHS, [name], MD, 15 mg at 02/11/15 2108 .  ondansetron (ZOFRAN) tablet 4 mg, 4 mg, Oral, Q6H PRN **OR** ondansetron (ZOFRAN) injection 4 mg, 4 mg, Intravenous, Q6H PRN, [name], MD .  pantoprazole sodium (PROTONIX) 40 mg/20 mL oral suspension 40 mg, 40 mg, Per Tube, BID, [name], MD, 40  mg at 02/11/15 2204 .  sulfamethoxazole-trimethoprim (BACTRIM,SEPTRA) 200-40 MG/5ML suspension 20 mL, 20 mL, Per Tube, Q12H, Curtis SitesBert J Klein III, MD, 20 mL at 02/11/15 2204  PHYSICAL EXAM:  BP 120/68 mmHg  Pulse 93  Temp(Src) 98.5 F (36.9 C) (Axillary)  Resp 16  Ht 6' (1.829 m)  Wt 90.538 kg (199 lb 9.6 oz)  BMI 27.06 kg/m2  SpO2 97%  General: Well developed, well nourished male, opens eyes and looks at examiner, but otherwise not interacting HEENT: PERRL; post op changes;OP slightly dry without lesions. Neck: increased tension, trachea midline, no thyromegaly Chest: normal to  palpation Lungs: no retractions or wheezes; few crackles Cardiovascular: RRR, distant; distal pulses 2+ Abdomen: soft, nontender, nondistended, positive bowel sounds.  PEG in place.  Extremities: increased spasticity throughout; no edema Derm: no significant rashes or nodules; good skin turgor Lymph: no cervical or supraclavicular lymphadenopathy  Labs and imaging studies were reviewed  ASSESSMENT/PLAN:   1. Aspiration pneumonia/acute resp failure- resp status improving; On ceftin for aspiration and septra for MRSA screen positive.  CXR unchanged but afebrile. 2. Traumatic brain injury history/tetraplegia/metabolic encephalopathy-   CT without acute change. Appears to be having recurrent seizures by family description and by EEG finding: neurology note reviewed; Dilantin being held as per neurology. 3. Hypotension- off propranolol; BP improved.      4. D/c plan- has been turned down for inpt rehab.

## 2015-02-12 NOTE — Progress Notes (Signed)
Pt has removed CPAP mask and is refusing to place it back on for sleep. Pt placed 2L [place].

## 2015-02-12 NOTE — Progress Notes (Signed)
Neurology:  Pt was still solmulent last evening.  Decided to stop dilantin as well. If further blank stares will consider repeating EEG on Monday.  Pauletta BrownsZEYLIKMAN, Maison Kestenbaum

## 2015-02-12 NOTE — Progress Notes (Signed)
Notified Dr Dan HumphreysWalker of family request for nystatin powder; orders received

## 2015-02-13 MED ORDER — JEVITY 1.5 CAL/FIBER PO LIQD
237.0000 mL | Freq: Every day | ORAL | Status: DC
Start: 2015-02-13 — End: 2016-08-08

## 2015-02-13 MED ORDER — SULFAMETHOXAZOLE-TRIMETHOPRIM 200-40 MG/5ML PO SUSP: 20.0000 mL | Freq: Two times a day (BID) | ORAL | Status: DC
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
## 2015-02-13 NOTE — Progress Notes (Signed)
Spoke to Dr. Hyacinth MeekerMiller about baclofen pump and anticipate discharge tomorrow.  Spoke to sitter at bedside about anticipated plans. Adelina MingsKim Shadi Sessler Rn

## 2015-02-13 NOTE — Care Management (Addendum)
Spoke with patient's mother. She would like for [facility] due to "seizures confirmed on EEG". Dr. Bethann PunchesMark Miller notified and will call [facility] today after 5PM (office hours) to see if there's an accepting MD. Patient denied by his health insurance [facility]MD will have to call [employer] to appeal denial. Patient's mom [phone]) states that "he is still having episodes of staring". Dr. Hyacinth MeekerMiller notified of mother's statement. Carelink packet stated. Patient's mom aware and agrees that if denied by inpatient rehab and/or transfer to another hospital that she will have home health re-instated with [facility]. She is aware that there is nothing further (per MD) that can be done on this admission. Patient is at baseline at this time per PT.  Per Raiford Nobleick RNCM per Dr. Hyacinth MeekerMiller [facility] and that [facility]. Patient's mother said that "if he cannot transfer I [place]

## 2015-02-13 NOTE — Care Management (Signed)
Spoke with Dr Hyacinth MeekerMiller concerning patient discharge plan. MD stated that the patient is at baseline and should be ready for discharge.  Also case discussed with PT Kristen who stated that he would be released from PT here. Patient does not meet for CIR or have a skillable need. Dr Hyacinth MeekerMiller stated that the patient could discharge to home perhaps with resumption of Home Health.

## 2015-02-13 NOTE — Progress Notes (Signed)
Patient ID: [name] [name], male   DOB: 08/04/1985, 29 y.o.   MRN: [id number] Patient ID: [name] [name], male   DOB: 02/10/1986, 29 y.o.   MRN: [id number] Patient ID: [name] [name], male   DOB: 06/08/1986, 29 y.o.   MRN: [id number] SUBJECTIVE:  Admitted with aspiration pneumonitis and acute resp failure; films with LLL infiltrate.  Pt with h/o TBI with quadriplegia, s/p PEG.  Has had worsening lethargy/metabolic encephalopathy.  Went for EEG , confirms epileptiform discharges despite current medication. Very awake last night with agitation.  ______________________________________________________________________  ROS: Please see HPI; unable to obtain any history from pt   Past Medical History  Diagnosis Date  . Hepatitis C   . Scoliosis   . 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 Subarachnoid hemorrhage   . Pulmonary emboli   . Hepatitis C   . Depression     Past Surgical History  Procedure Laterality Date  . Fracture surgery      left acetabulum  . Ileostomy    . Lung tube    . Ankle surgery Bilateral   . Tracheostomy    . Peg tube placement       Current facility-administered medications:  .  acetaminophen (TYLENOL) tablet 650 mg, 650 mg, Oral, Q6H PRN **OR** acetaminophen (TYLENOL) suppository 650 mg, 650 mg, Rectal, Q6H PRN, Auburn BilberryShreyang Patel, MD .  albuterol (PROVENTIL) (2.5 MG/3ML) 0.083% nebulizer solution 3 mL, 3 mL, Inhalation, Q4H PRN, Auburn BilberryShreyang Patel, MD, 3 mL at 02/06/15 1521 .  antiseptic oral rinse (CPC / CETYLPYRIDINIUM CHLORIDE 0.05%) solution 7 mL, 7 mL, Mouth Rinse, q12n4p, Auburn BilberryShreyang Patel, MD, 7 mL at 02/12/15 1602 .  apixaban (ELIQUIS) tablet 5 mg, 5 mg, Oral, BID, Auburn BilberryShreyang Patel, MD, 5 mg at 02/12/15 2051 .  baclofen (LIORESAL) tablet 20 mg, 20 mg, Oral, TID, Auburn BilberryShreyang Patel, MD, 20 mg at 02/12/15 2050 .  bisacodyl (DULCOLAX) suppository 10 mg, 10 mg, Rectal, QHS, Clydie Braunavid Fitzgerald, MD, 10 mg at 02/12/15 2052 .  chlorhexidine (PERIDEX) 0.12 % solution 15 mL, 15 mL, Mouth  Rinse, BID, Auburn BilberryShreyang Patel, MD, 15 mL at 02/12/15 2107 .  docusate sodium (COLACE) capsule 100 mg, 100 mg, Oral, BID, Auburn BilberryShreyang Patel, MD, 100 mg at 02/12/15 2050 .  feeding supplement (JEVITY 1.5 CAL/FIBER) liquid 237 mL, 237 mL, Per Tube, 5 X Daily, Curtis SitesBert J Klein III, MD, 237 mL at 02/13/15 0600 .  free water 300 mL, 300 mL, Per Tube, 4 times per day, Clydie Braunavid Fitzgerald, MD, 300 mL at 02/13/15 0523 .  ipratropium-albuterol (DUONEB) 0.5-2.5 (3) MG/3ML nebulizer solution 3 mL, 3 mL, Nebulization, Q6H, Curtis SitesBert J Klein III, MD, 3 mL at 02/12/15 1950 .  lacosamide (VIMPAT) tablet 200 mg, 200 mg, Oral, BID, Auburn BilberryShreyang Patel, MD, 200 mg at 02/12/15 2051 .  multivitamin with minerals tablet 1 tablet, 1 tablet, Oral, Daily, Auburn BilberryShreyang Patel, MD, 1 tablet at 02/12/15 1036 .  nystatin (MYCOSTATIN/NYSTOP) topical powder, , Topical, TID, Elmo PuttJohn B Walker III, MD .  OLANZapine (ZYPREXA) tablet 15 mg, 15 mg, Oral, QHS, Auburn BilberryShreyang Patel, MD, 15 mg at 02/12/15 2051 .  ondansetron (ZOFRAN) tablet 4 mg, 4 mg, Oral, Q6H PRN **OR** ondansetron (ZOFRAN) injection 4 mg, 4 mg, Intravenous, Q6H PRN, Auburn BilberryShreyang Patel, MD .  pantoprazole sodium (PROTONIX) 40 mg/20 mL oral suspension 40 mg, 40 mg, Per Tube, BID, Danella PentonMark F Miller, MD, 40 mg at 02/12/15 2052 .  sulfamethoxazole-trimethoprim (BACTRIM,SEPTRA) 200-40 MG/5ML suspension 20 mL, 20 mL, Per Tube, Q12H, Lynnea FerrierBert J Klein III, MD, 20  mL at 02/12/15 2052  PHYSICAL EXAM:  BP 104/64 mmHg  Pulse 87  Temp(Src) 97.8 F (36.6 C) (Oral)  Resp 17  Ht 6' (1.829 m)  Wt 90.084 kg (198 lb 9.6 oz)  BMI 26.93 kg/m2  SpO2 96%  General: Well developed, well nourished male, opens eyes and looks at examiner, but otherwise not interacting HEENT: PERRL; post op changes;OP slightly dry without lesions. Neck: increased tension, trachea midline, no thyromegaly Chest: normal to palpation Lungs: no retractions or wheezes; few crackles Cardiovascular: RRR, distant; distal pulses 2+ Abdomen: soft, nontender,  nondistended, positive bowel sounds.  PEG in place.  Extremities: increased spasticity throughout; no edema Derm: no significant rashes or nodules; good skin turgor Lymph: no cervical or supraclavicular lymphadenopathy  Labs and imaging studies were reviewed  ASSESSMENT/PLAN:   1. Aspiration pneumonia/acute resp failure- resp status improving; Off ceftin, cont septra for MRSA screen positive.  CXR unchanged but afebrile. 2. Traumatic brain injury history/tetraplegia/metabolic encephalopathy-   CT without acute change, off all medications, will follow mental status, neurology following  3. Hypotension- off propranolol; BP improved. needs discharge disposition

## 2015-02-13 NOTE — Care Management (Signed)
Immediately after speaking on the phone with Mr Leim FabryRick Biello I received a call from the patient's mother, Mrs. Francis GainesJeanie Lybarger. Mrs. [name] was very vocal and expressed her frustration that a second referral had not been placed. I tried to explain to Mrs. Gully the same information that I had explained to her husband earlier concerning the process for appealing [employer] denial. She stated that I did not do what I had told her and her husband I was going to do in submitting a second referral. I explained to her that I had contacted both [facility] and [employer] concerning a second referral and the appeal process and that they did not want the current PT notes. I explained that the current PT notes recommended home health and that the original notes that were submitted to insurance actually recommended C[facility]. I explained that Dr. Graciela HusbandsKlein had contacted [facility] concerning criteria and had stated the patient did not meet for inpatient rehab.  I explained that the process was for the attending physician to do a peer to peer case review with the medical director several times, and that per Standley DakinsGina Ayscue at [facility] and that insurance had to be routed through [facility] to [employer] per Coy SaunasMargo (patient NCM) at [employer]. Mrs. [name] was unsatisfied with this and accused me of being untruthful and threatened to call [facility] Dear[facility]rien [facility] and also to contact administration here. I urged her to do so. After this conversation I called Collie SiadAngela Johnson, lead case manager to discuss case. She was aware of patient and has experience with past care. I explained circumstances to her. While Marylene Landngela was on the floor Mrs. Espino came by to visit the patient and we were able to meet to discuss care. Mrs. After some discussion it was decided that Marylene Landngela would contact Dr Hyacinth MeekerMiller and [facility] and that I would again contact [facility] to send another referral. I contacted PT to arrange another PT evaluation for the referral.  Awaiting PT eval to send additional packet  unable to reach admissions coordinator Standley DakinsGina Ayscue at [facility], left voicemail to discuss.

## 2015-02-13 NOTE — Progress Notes (Signed)
Pt is refusing to wear CPAP. I've attempted to place mask several times and pt. Continues to refuse

## 2015-02-13 NOTE — Care Management (Addendum)
Spoke with the father of the patient via telephone concerning discharge plan.  Mr [name] [name] asked if I had sent the new PT notes to [facility] and I explained that I has spoke to [name] at [facility] who had stated to me that the correct procedure for appealing insurance denial was to have his attending physician call the medical director at [facility] and do a peer to peer review of the case. [name] [name] had given me the instructions for this and I had informed Dr [name] [name] on Friday about this. Dr [name] [name] stated that he had contacted [facility] and that the patient wouldn't not meet for a skillable need to go inpatient REHAB. I also contacted Case manager at [facility] [name] [name]  [phone] to discuss what needed to take place to appeal decision.   I had informed Mr [name] [name] that I had asked Dr [name] [name] to speak with Mrs [name] [name] about this on Friday and that I had observed Dr [name] [name] speaking with Mr [name] [name] that evening. I also informed Mr [name] [name] that I had spoke with Dr [name] [name] this morning and the plan would be to discharge home with Home Health Services. I also informed Mr [name] [name] that the PT notes [facility] stated that Inpatient rehab was recommended and that subsequent notes stated that the patient would need home health PT.Asked Mr [name] [name] if he had any questions and he said no.

## 2015-02-13 NOTE — Progress Notes (Signed)
Nutrition Follow-up       INTERVENTION:   EN: Continue jevity 1.5, 5 cans per day to meet nutritional needs and po diet for pleasure feeding at this time.   NUTRITION DIAGNOSIS:   Inadequate oral intake related to inability to eat as evidenced by NPO status. Being addressed with tube feeding and po pleasure feeding    GOAL:   Patient will meet greater than or equal to 90% of their needs  Meeting nutritional needs with tube feeding  MONITOR:    (Energy Intake, EN, Digestive System, Electrolyte/Renal Profile, Glucose Profile)  REASON FOR ASSESSMENT:   Consult Assessment of nutrition requirement/status  ASSESSMENT:   Current Nutrition: tolerating jevity 1.5, 5 cans per day per RN, Selena BattenKim, no signs or symptoms of GI distress   Last BM: 7/17   Medications: reviewed  Electrolyte/Renal Profile and Glucose Profile:   Recent Labs Lab 02/07/15 0657 02/08/15 0456 02/11/15 0801  NA 141 144 143  K 3.5 3.5 4.1  CL 102 105 104  CO2 31 32 30  BUN 16 12 12   CREATININE 0.63 0.56* 0.60*  CALCIUM 8.2* 8.2* 8.8*  GLUCOSE 89 98 89   Protein Profile:  Recent Labs Lab 02/07/15 0657 02/08/15 0456 02/11/15 0801  ALBUMIN 3.0* 3.0* 3.3*       Weight Trend since Admission: Filed Weights   02/11/15 0348 02/12/15 0500 02/13/15 0616  Weight: 203 lb 3.2 oz (92.171 kg) 199 lb 9.6 oz (90.538 kg) 198 lb 9.6 oz (90.084 kg)      Diet Order:  DIET DYS 2 Room service appropriate?: Yes; Fluid consistency:: Honey Thick Diet - low sodium heart healthy  Skin:  Reviewed, no issues   Height:   Ht Readings from Last 1 Encounters:  01/29/15 6' (1.829 m)    Weight:   Wt Readings from Last 1 Encounters:  02/13/15 198 lb 9.6 oz (90.084 kg)      Wt Readings from Last 10 Encounters:  02/13/15 198 lb 9.6 oz (90.084 kg)  11/09/14 190 lb (86.183 kg)  02/04/12 165 lb (74.844 kg)  05/23/11 168 lb (76.204 kg)    BMI:  Body mass index is 26.93 kg/(m^2).  Estimated  Nutritional Needs:   Kcal:  [id number] kcals/ (1.2 AF, 08-1.0 IF)  Protein:  92-110 g/d (1.0-1.2 g/d)  Fluid:  2300-284850ml/d (25-9830ml/kg)   EDUCATION NEEDS:   No education needs identified at this time  MODERATE Care Level  Bernestine Holsapple B. Freida BusmanAllen, RD, LDN [phone] (pager)

## 2015-02-13 NOTE — Progress Notes (Signed)
Pt's mother called and noted that patients pump is due to be refilled tomorrow (02-03-15). Spoke to Pharmacy and Pharmacy will follow-up with Dr. Hyacinth MeekerMiller. Pt's sitter states that [facility]. Adelina MingsKim Dashon Mcintire Rn

## 2015-02-14 NOTE — Progress Notes (Signed)
Patient discharged to home as ordered. Patients caregiver at the bedside to take patient home. Morning medications given as well as Peg tube feeding, Patient caregiver able to place patient in the chair and [place] his wheelchair. Patient is alert and oriented to self. Tolerating tube feedings  . No acute distress noted.

## 2015-02-14 NOTE — Care Management (Signed)
[facility] inpatient rehab [phone]  fax [phone] is currently not open for service. This RNCM will follow up with them regarding inpatient rehab. Spoke with Dr. Hyacinth MeekerMiller and he has talked to patient's mother this AM. [facility] declined transfer acceptance. Per Dr. Hyacinth MeekerMiller patient discharging home. Barbara CowerJason with [facility] notified of [facility] home health need. Mother transfers patient in private vehicle.

## 2015-04-08 ENCOUNTER — Emergency Department: Payer: BLUE CROSS/BLUE SHIELD

## 2015-04-08 ENCOUNTER — Encounter: Payer: Self-pay | Admitting: Adult Health

## 2015-04-08 ENCOUNTER — Emergency Department
Admission: EM | Admit: 2015-04-08 | Discharge: 2015-04-08 | Disposition: A | Discharge status: Home / Self Care | Payer: BLUE CROSS/BLUE SHIELD | Attending: Emergency Medicine | Admitting: Emergency Medicine

## 2015-04-08 DIAGNOSIS — J690 Pneumonitis due to inhalation of food and vomit: Secondary | ICD-10-CM

## 2015-04-08 DIAGNOSIS — G8221 Paraplegia, complete: Secondary | ICD-10-CM | POA: Diagnosis not present

## 2015-04-08 DIAGNOSIS — L988 Other specified disorders of the skin and subcutaneous tissue: Secondary | ICD-10-CM | POA: Insufficient documentation

## 2015-04-08 DIAGNOSIS — T148XXA Other injury of unspecified body region, initial encounter: Secondary | ICD-10-CM

## 2015-04-08 DIAGNOSIS — Z87891 Personal history of nicotine dependence: Secondary | ICD-10-CM | POA: Insufficient documentation

## 2015-04-08 DIAGNOSIS — T17908A Unspecified foreign body in respiratory tract, part unspecified causing other injury, initial encounter: Secondary | ICD-10-CM

## 2015-04-08 DIAGNOSIS — Z79899 Other long term (current) drug therapy: Secondary | ICD-10-CM | POA: Insufficient documentation

## 2015-04-08 DIAGNOSIS — R471 Dysarthria and anarthria: Secondary | ICD-10-CM | POA: Insufficient documentation

## 2015-04-08 DIAGNOSIS — Z791 Long term (current) use of non-steroidal anti-inflammatories (NSAID): Secondary | ICD-10-CM | POA: Insufficient documentation

## 2015-04-08 LAB — CBC WITH DIFFERENTIAL/PLATELET
BASOS PCT: 1 %
Basophils Absolute: 0 10*3/uL (ref 0–0.1)
EOS ABS: 0.1 10*3/uL (ref 0–0.7)
Eosinophils Relative: 2 %
HEMATOCRIT: 41.1 % (ref 40.0–52.0)
HEMOGLOBIN: 13.5 g/dL (ref 13.0–18.0)
Lymphocytes Relative: 37 %
Lymphs Abs: 1.7 10*3/uL (ref 1.0–3.6)
MCH: 27.2 pg (ref 26.0–34.0)
MCHC: 32.8 g/dL (ref 32.0–36.0)
MCV: 82.9 fL (ref 80.0–100.0)
Monocytes Absolute: 0.5 10*3/uL (ref 0.2–1.0)
Monocytes Relative: 10 %
NEUTROS ABS: 2.3 10*3/uL (ref 1.4–6.5)
NEUTROS PCT: 50 %
Platelets: 187 10*3/uL (ref 150–440)
RBC: 4.96 MIL/uL (ref 4.40–5.90)
RDW: 15.7 % — ABNORMAL HIGH (ref 11.5–14.5)
WBC: 4.7 10*3/uL (ref 3.8–10.6)

## 2015-04-08 LAB — BASIC METABOLIC PANEL
ANION GAP: 8 (ref 5–15)
BUN: 14 mg/dL (ref 6–20)
CHLORIDE: 107 mmol/L (ref 101–111)
CO2: 28 mmol/L (ref 22–32)
Calcium: 8.9 mg/dL (ref 8.9–10.3)
Creatinine, Ser: 0.5 mg/dL — ABNORMAL LOW (ref 0.61–1.24)
GFR calc Af Amer: >60 mL/min (ref >60)
GFR calc non Af Amer: >60 mL/min (ref >60)
Glucose, Bld: 94 mg/dL (ref 65–99)
Potassium: 4.2 mmol/L (ref 3.5–5.1)
SODIUM: 143 mmol/L (ref 135–145)

## 2015-04-08 MED ORDER — CLINDAMYCIN HCL 150 MG PO CAPS
450.0000 mg | ORAL_CAPSULE | Freq: Once | ORAL | Status: AC
  Administered 2015-04-08: 450 mg via ORAL
  Filled 2015-04-08: qty 3

## 2015-04-08 MED ORDER — CLINDAMYCIN HCL 150 MG PO CAPS: 450.0000 mg | ORAL_CAPSULE | Freq: Three times a day (TID) | ORAL | Status: DC

## 2015-04-08 NOTE — ED Provider Notes (Addendum)
[facility] Emergency Department Provider Note   ____________________________________________  Time seen: 7 PM I have reviewed the triage vital signs and the triage nursing note.  HISTORY  Chief Complaint Aspiration   Historian Patient's mom, and caregiver  HPI [name] [name] is a 29 y.o. male who is a history of traumatic brain injury, as well as has a G-tube, who is cared for at home by his family as well as the caregiver, is presenting after a witnessed aspiration event. Caregivers report patient was eating yogurt and witnessed to have a large aspiration event where he was having trouble breathing, began coughing, became hypoxic, and was wheezing. The patient has decreased gag reflex at baseline, and had trouble coughing to clear some of the aspirated material. He did drop his oxygen sats into the 70s. He does not normally wear home O2, but they do have available. The placed oxygen on him at 3 L nasal cannula and he remained hypoxic in the 80s. He was wheezing significantly and they tried a DuoNeb treatment. Even after the wheezing had gone away and the DuoNeb was completed, he continued to be persistently hypoxic. This caused them to call 911 to come to the ED for evaluation. Ultimately his O2 sat did come up into the upper 90s on 3 L nasal cannula.the patient is currently not complaining of chest pain or shortness of breath or trouble breathing.  Of note the family took the patient to urgent care this afternoon before the aspiration event due to skin redness around the site of his G-tube. He was placed on antibiotic Bactrim for possibility of staph infection of the skin per the family. He had one dose of Bactrim today.  He's not had a fever or chills. He's not had vomiting.    Past Medical History  Diagnosis Date  . Hepatitis C   . Scoliosis   . 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 Subarachnoid hemorrhage   . Pulmonary emboli   . Hepatitis C   . Depression      Patient Active Problem List   Diagnosis Date Noted  . Convulsions/seizures 02/10/2015  . Aspiration pneumonia 02/07/2015  . Encephalopathy, metabolic 02/07/2015  . Acute respiratory failure 01/29/2015  . 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 Skin irritation 11/09/2014  . TBI (traumatic brain injury) 02/04/2012  . 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 Fracture surgery      left acetabulum  . Ileostomy    . Lung tube    . Ankle surgery Bilateral   . Tracheostomy    . Peg tube placement      Current Outpatient Rx  Name  Route  Sig  Dispense  Refill  . albuterol (PROVENTIL) (2.5 MG/3ML) 0.083% nebulizer solution   Inhalation   Inhale 3 mLs into the lungs 2 (two) times daily as needed.         Marland Kitchen apixaban (ELIQUIS) 5 MG TABS tablet   Oral   Take 5 mg by mouth 2 (two) times daily.         . baclofen (LIORESAL) 20 MG tablet   Oral   Take 20 mg by mouth 3 (three) times daily.         . clindamycin (CLEOCIN) 150 MG capsule   Oral   Take 3 capsules (450 mg total) by mouth 3 (three) times daily.   90 capsule   0   . Cranberry 1000  MG CAPS   Oral   Take 1,000 mg by mouth daily as needed.          . docusate sodium (COLACE) 100 MG capsule   Oral   Take 100 mg by mouth 2 (two) times daily.          Marland Kitchen gabapentin (NEURONTIN) 300 MG capsule   Oral   Take 600 mg by mouth 3 (three) times daily.          . indomethacin (INDOCIN SR) 75 MG CR capsule   Oral   Take 75 mg by mouth daily.         Marland Kitchen lacosamide (VIMPAT) 200 MG TABS tablet   Oral   Take 200 mg by mouth 2 (two) times daily.         . Multiple Vitamins-Minerals (MULTIVITAMIN WITH MINERALS) tablet   Oral   Take 1 tablet by mouth daily.         . Nutritional Supplements (FEEDING SUPPLEMENT, JEVITY 1.5 CAL/FIBER,) LIQD   Per Tube   Place 237 mLs into feeding tube 5 (five) times daily.         Marland Kitchen  OLANZapine (ZYPREXA) 15 MG tablet   Oral   Take 15 mg by mouth at bedtime.         . pantoprazole (PROTONIX) 40 MG tablet   Oral   Take 40 mg by mouth 2 (two) times daily.          . propranolol ER (INDERAL LA) 60 MG 24 hr capsule   Oral   Take 60 mg by mouth daily.         . risedronate (ACTONEL) 35 MG tablet   Oral   Take 35 mg by mouth every 7 (seven) days. with water on empty stomach, nothing by mouth or lie down for next 30 minutes. Take on Saturday.         . Water For Irrigation, Sterile (FREE WATER) SOLN   Per Tube   Place 300 mLs into feeding tube every 6 (six) hours.   1000 mL   11     Allergies Ambien; Ativan; Depakote er; Dilaudid; and Keppra  Family History  Problem Relation Age of Onset  . Hypertension      Social History Social History  Substance Use Topics  . Smoking status: Former Smoker    Quit date: 08/07/2011  . Smokeless tobacco: Never Used  . Alcohol Use: No    Review of Systems  Constitutional: Negative for fever. Eyes: Negative for visual changes. ENT: Negative for sore throat. Cardiovascular: Negative for chest pain. Respiratory: positivefor shortness of breath habits had a episode Gastrointestinal: Negative for abdominal pain, vomiting and diarrhea. Genitourinary: no change in urinary habits Musculoskeletal: Negative for back pain. Skin: Negative for rash. Neurological: Negative for headache.no altered mental status 10 point Review of Systems otherwise negative ____________________________________________   PHYSICAL EXAM:  VITAL SIGNS: ED Triage Vitals  Enc Vitals Group     BP 04/08/15 1741 119/82 mmHg     Pulse Rate 04/08/15 1741 71     Resp 04/08/15 1741 22     Temp 04/08/15 1741 98.5 F (36.9 C)     Temp Source 04/08/15 1741 Oral     SpO2 04/08/15 1741 98 %     Weight --      Height --      Head Cir --      Peak Flow --      Pain Score 04/08/15 1742  7     Pain Loc --      Pain Edu? --      Excl. in GC? --       Constitutional: Alert and oriented. Well appearing and in no distress. Eyes: Conjunctivae are normal. PERRL. Normal extraocular movements. ENT   Head: Normocephalic and atraumatic.   Nose: No congestion/rhinnorhea.   Mouth/Throat: Mucous membranes are moist.   Neck: No stridor. Cardiovascular/Chest: Normal rate, regular rhythm.  No murmurs, rubs, or gallops. Respiratory: Normal respiratory effort without tachypnea nor retractions. Breath sounds are clear and equal bilaterally. No wheezes/rales/rhonchi. Gastrointestinal: Soft. No distention, no guarding, no rebound. Nontender  . Small erythema of the skin on the superior portion around the G-tube. Small area of skin wound without induration or drainage on the superior edge of the G-tube. Genitourinary/rectal:Deferred Musculoskeletal: baseline contractures arms and legs Neurologic: baseline neurologic exam with some ability to answer questions, but with baseline dysarthria, and paralysis of the arms and legs. Skin:  Skin is warm, dry and intact. No rash noted.   ____________________________________________   EKG I, Governor Rooks, MD, the attending physician have personally viewed and interpreted all ECGs.  No EKG performed ____________________________________________  LABS (pertinent positives/negatives)  CBC within normal limits Basic metabolic panel within normal limits  ____________________________________________  RADIOLOGY All Xrays were viewed by me. Imaging interpreted by Radiologist.  Chest x-ray portable: No focal opacities, low lung volumes __________________________________________  PROCEDURES  Procedure(s) performed: None  Critical Care performed: None  ____________________________________________   ED COURSE / ASSESSMENT AND PLAN  CONSULTATIONS: None  Pertinent labs & imaging results that were available during my care of the patient were reviewed by me and considered in my medical  decision making (see chart for details).  The patient had an episode of aspiration with hypoxia that lasted over 20 minutes, which is highly unusual for him. Given this aspiration event, and his underlying chronic illness, I discussed with the family the option of watching and waiting for signs of pneumonia such as additional hypoxia, altered mental status, fever, or trouble breathing, or going ahead with clindamycin at this point in time.  They're much more comfortable starting clindamycin now. I'm to stop the Bactrim which was started for possible MRSA to the skin of the abdomen around the G-tube, as cclindamycin and will cover this possibility as well.I discussed with the mom and caregiver that the patient will need this very closely managed by primary care physician and likely the [place] wound [facility].  Patient was taken off of O2 for at least 20 minutes observed here in the emergency department and his O2 sat stayed between 89% and 96% on room air. I discussed with the mom and the caregiver about observing overnight versus home tonight. I think it's very reasonable for him to go home tonight as they have oxygen if he ends up needing it. He is being started on the medication for possibility of development of an aspiration pneumonia, as I do not suspect him to have worsening clinical condition.  Patient / Family / Caregiver informed of clinical course, medical decision-making process, and agree with plan.   I discussed return precautions, follow-up instructions, and discharged instructions with patient and/or family.  ___________________________________________   FINAL CLINICAL IMPRESSION(S) / ED DIAGNOSES   Final diagnoses:  Aspiration pneumonia, unspecified aspiration pneumonia type  Wound of skin       Governor Rooks, MD 04/08/15 1942  Governor Rooks, MD 04/08/15 2007

## 2015-04-08 NOTE — ED Notes (Addendum)
Per EMS-pt was eating yogurt and choked, family is concerned for aspiration. Pt is bed bound from traumatic brain injury, right breath sounds with some crackles, has [facility] and has been given one bactrim today.  Pt states, "i don't feel good" per family after choking episode he turned blue and his oxygen saturations was in the low 70s, the placed him on 02 which is only usually needed at night and it returned to normal. He normal runs in the high 90s.  He is currently 99% on 3 liters

## 2015-04-08 NOTE — Discharge Instructions (Signed)
[name] [name] was evaluated after a witnessed aspiration event which is concerning for development of an aspiration pneumonia, and so he is being started on clindamycin antibiotic. Stop the Bactrim. Clindamycin will also cover for staph skin infection. Return to the emergency department for any new or worsening condition including trouble breathing, shortness of breath, chest pain, decreased oxygen level, or any altered mental status.   Aspiration Pneumonia Aspiration pneumonia is an infection in your lungs. It occurs when food, liquid, or stomach contents (vomit) are inhaled (aspirated) into your lungs. When these things get into your lungs, swelling (inflammation) and infection can occur. This can make it difficult for you to breathe. Aspiration pneumonia is a serious condition and can be life threatening. RISK FACTORS Aspiration pneumonia is more likely to occur when a person's cough (gag) reflex or ability to swallow has been decreased. Some things that can do this include:   Having a brain injury or disease, such as stroke, seizures, Parkinson's disease, dementia, or amyotrophic lateral sclerosis (ALS).   Being given general anesthetic for procedures.   Being in a coma (unconscious).   Having a narrowing of the tube that carries food to the stomach (esophagus).   Drinking too much alcohol. If a person passes out and vomits, vomit can be swallowed into the lungs.   Taking certain medicines, such as tranquilizers or sedatives.  SIGNS AND SYMPTOMS   Coughing after swallowing food or liquids.   Breathing problems, such as wheezing or shortness of breath.   Bluish skin. This can be caused by lack of oxygen.   Coughing up food or mucus. The mucus might contain blood, greenish material, or yellowish-white fluid (pus).   Fever.   Chest pain.   Being more tired than usual (fatigue).   Sweating more than usual.   Bad breath.  DIAGNOSIS  A physical exam will be done. During the  exam, the health care provider will listen to your lungs with a stethoscope to check for:   Crackling sounds in the lungs.  Decreased breath sounds.  A rapid heartbeat. Various tests may be ordered. These may include:   Chest X-ray.   CT scan.   Swallowing study. This test looks at how food is swallowed and whether it goes into your breathing tube (trachea) or food pipe (esophagus).   Sputum culture. Saliva and mucus (sputum) are collected from the lungs or the tubes that carry air to the lungs (bronchi). The sputum is then tested for bacteria.   Bronchoscopy. This test uses a flexible tube (bronchoscope) to see inside the lungs. TREATMENT  Treatment will usually include antibiotic medicines. Other medicines may also be used to reduce fever or pain. You may need to be treated in the hospital. In the hospital, your breathing will be carefully monitored. Depending on how well you are breathing, you may need to be given oxygen, or you may need breathing support from a breathing machine (ventilator). For people who fail a swallowing study, a feeding tube might be placed in the stomach, or they may be asked to avoid certain food textures or liquids when they eat. HOME CARE INSTRUCTIONS   Carefully follow any special eating instructions you were given, such as avoiding certain food textures or thickening liquids. This reduces the risk of developing aspiration pneumonia again.  Only take over-the-counter or prescription medicines as directed by your health care provider. Follow the directions carefully.   If you were prescribed antibiotics, take them as directed. Finish them even if you start  to feel better.   Rest as instructed by your health care provider.   Keep all follow-up appointments with your health care provider.  SEEK MEDICAL CARE IF:   You develop worsening shortness of breath, wheezing, or difficulty breathing.   You develop a fever.   You have chest pain.   MAKE SURE YOU:   Understand these instructions. Aspiration Precautions Aspiration is the inhaling of a liquid or object into the lungs. Things that can be inhaled into the lungs include: Food. Any type of liquid, such as drinks or saliva. Stomach contents, such as vomit or stomach acid. When these things go into the lungs, damage can occur. Serious complications can then result, such as: A lung infection (pneumonia). A collection of pus in the lungs (lung abscess). CAUSES A decreased level of awareness (consciousness) due to: Traumatic brain injury or head injury. Stroke. Neurological disease. Seizures. Decreased or absent gag reflex (inability to cough). Medical conditions that affect swallowing. Conditions that affect the food pipe (esophagus) such as a narrowing of the esophagus (esophageal stricture). Gastroesophageal reflux (GERD). This is also known as acid reflux. Any type of surgery where you are put under general anesthesia or have sedation. Drinking large amounts of alcohol. Taking medication that causes drowsiness, confusion, or weakness. Aging. Dental problems. Having a feeding tube. SYMPTOMS When aspiration occurs, different signs and symptoms can occur, such as: Coughing (if a person has a cough or gag reflex) after swallowing food or liquids. Difficulty breathing. This can include things like: Breathing rapidly. Breathing very slowly. Loud breathing. Hearing "gurgling" lung sounds when a person breaths. Coughing up phlegm (sputum) that is: Yellow, tan, or green in color. Has pieces of food in it. Bad smelling. A change in voice (hoarseness) or a "gurgly" sound to the voice. A change in skin color. The skin may turn red, or a "bluish" type color because of a lack of oxygen (cyanosis). Fever. Eyes watering. Pain in the chest or back. Facial grimacing . A feeling of fullness in the throat or that something is stuck in the throat. DIAGNOSIS A chest X-ray  may be performed. This takes a picture of your lungs. It can show changes in the lungs if aspiration has occurred. A bronchoscopy may be performed. This is a surgical procedure in which a thin, flexible tube with a camera at the end is inserted into the nose or mouth. The tube is advanced to the lungs so your health care provider can view the lungs and obtain a culture, tissue sample, or remove an aspirated object. A swallowing evaluation study may be performed to evaluate: A person's risk of aspiration. How difficult it is for a person to swallow. What types of foods are safe for a person to eat. PREVENTION If you are a caregiver to someone who may aspirate, follow the directions below. If you are caring for someone who can eat and drink through their mouth: Have them sit in an upright position when eating food or drinking fluids, such as: Sitting up in a chair. If sitting in a chair is not possible, position the person in bed so they are upright. Remind the person to eat slowly and chew well. Do not distract the person. This is especially important for people with thinking or memory (cognitive) problems. Check the person's mouth for leftover food after eating. Keep the person sitting upright for 30 to 45 minutes after eating. Do not serve food or drink for at least 2 hours before bedtime. If you  are caring for someone with a feeding tube and he or she cannot eat or drink through their mouth: Keep the person in an upright position as much as possible. Do not  lay the person flat if they are getting continuous feedings. Turn the feeding pump off if you need to lay the person flat for any reason. Check feeding tube residuals as directed by your health care provider. If a large amount of tube feedings are pulled back (aspirated) from the feeding tube, call your health care provider right away. General guidelines to prevent aspiration include: Feed small amounts of food. Do not force feed. Use as  little water as possible when brushing the person's teeth or cleaning his or her mouth. Provide oral care before and after meals. Never put food or fluids in the mouth of a person who is not fully alert. Crush pills and put them in soft food such as pudding or ice cream. Some pills should not be crushed. Check with your health care provider before crushing any medication. SEEK IMMEDIATE MEDICAL CARE IF:  The person has trouble breathing or starts to breathe rapidly. The person is breathing very slowly or stops breathing. The person coughs a lot after eating or drinking. The person has a chronic cough. The person coughs up thick, yellow, or tan sputum. The person has a fever or persistent symptoms for more than 72 hours. The person has a fever and their symptoms suddenly get worse. Document Released: 08/17/2010 Document Revised: 07/20/2013 Document Reviewed: 10/20/2013 [facility] Patient Information 2015 [place], [place]. This information is not intended to replace advice given to you by your health care provider. Make sure you discuss any questions you have with your health care provider.   Will watch your condition.  Will get help right away if you are not doing well or get worse. Document Released: 05/12/2009 Document Revised: 07/20/2013 Document Reviewed: 12/31/2012 [facility] Patient Information 2015 [place], [place]. This information is not intended to replace advice given to you by your health care provider. Make sure you discuss any questions you have with your health care provider.

## 2015-04-11 ENCOUNTER — Encounter: Payer: BLUE CROSS/BLUE SHIELD | Attending: Surgery | Admitting: Surgery

## 2015-04-11 DIAGNOSIS — S31101A Unspecified open wound of abdominal wall, left upper quadrant without penetration into peritoneal cavity, initial encounter: Secondary | ICD-10-CM | POA: Diagnosis not present

## 2015-04-11 DIAGNOSIS — X58XXXA Exposure to other specified factors, initial encounter: Secondary | ICD-10-CM | POA: Insufficient documentation

## 2015-04-11 DIAGNOSIS — I10 Essential (primary) hypertension: Secondary | ICD-10-CM | POA: Insufficient documentation

## 2015-04-11 DIAGNOSIS — G8220 Paraplegia, unspecified: Secondary | ICD-10-CM | POA: Diagnosis not present

## 2015-04-11 DIAGNOSIS — Z2252 Carrier of viral hepatitis C: Secondary | ICD-10-CM | POA: Insufficient documentation

## 2015-04-11 DIAGNOSIS — K9420 Gastrostomy complication, unspecified: Secondary | ICD-10-CM | POA: Diagnosis not present

## 2015-04-11 DIAGNOSIS — Z87891 Personal history of nicotine dependence: Secondary | ICD-10-CM | POA: Insufficient documentation

## 2015-04-11 NOTE — Progress Notes (Signed)
[name] [name] ([id number]) Visit Report for 04/11/2015 Allergy List Details Patient Name: [name] [name] Date of Service: 04/11/2015 2:30 PM Medical Record Number: [id number] Patient Account Number: [id number] Date of Birth/Sex: [birth year] (29 y.o. Male) Treating RN: Clover Mealy, RN, BSN, [place] Sink Primary Care Physician: Bethann Punches Other Clinician: Referring Physician: Governor Rooks Treating Physician/Extender: Rudene Re in Treatment: 0 Allergies Active Allergies Ambien ativan Depakote dilauded Keppra Allergy Notes Electronic Signature(s) Signed: 04/11/2015 3:14:12 PM By: Elpidio Eric BSN, RN Entered By: Elpidio Eric on 04/11/2015 15:14:12 [name] [name] ([id number]) -------------------------------------------------------------------------------- Arrival Information Details Patient Name: [name] [name] Date of Service: 04/11/2015 2:30 PM Medical Record Number: [id number] Patient Account Number: [id number] Date of Birth/Sex: 1985-10-27 (29 y.o. Male) Treating RN: Clover Mealy, RN, BSN, [place] Sink Primary Care Physician: Bethann Punches Other Clinician: Referring Physician: Governor Rooks Treating Physician/Extender: Rudene Re in Treatment: 0 Visit Information Patient Arrived: Wheel Chair Arrival Time: 15:04 Accompanied By: mother, sister Transfer Assistance: None Patient Identification Verified: Yes Secondary Verification Process Yes Completed: Patient Requires Transmission-Based No Precautions: Patient Has Alerts: No Notes verified by mother and sister Electronic Signature(s) Signed: 04/11/2015 3:04:56 PM By: Elpidio Eric BSN, RN Entered By: Elpidio Eric on 04/11/2015 15:04:55 [name] [name] ([id number]) -------------------------------------------------------------------------------- Clinic Level of Care Assessment Details Patient Name: [name] [name] Date of Service: 04/11/2015 2:30 PM Medical Record Number: [id number] Patient Account Number: [id number] Date of Birth/Sex: 1986-01-14 (29  y.o. Male) Treating RN: Clover Mealy, RN, BSN, [place] Sink Primary Care Physician: Bethann Punches Other Clinician: Referring Physician: Governor Rooks Treating Physician/Extender: Rudene Re in Treatment: 0 Clinic Level of Care Assessment Items TOOL 2 Quantity Score []  - Use when only an EandM is performed on the INITIAL visit 0 ASSESSMENTS - Nursing Assessment / Reassessment X - General Physical Exam (combine w/ comprehensive assessment (listed just 1 20 below) when performed on new pt. evals) X - Comprehensive Assessment (HX, ROS, Risk Assessments, Wounds Hx, etc.) 1 25 ASSESSMENTS - Wound and Skin Assessment / Reassessment X - Simple Wound Assessment / Reassessment - one wound 1 5 []  - Complex Wound Assessment / Reassessment - multiple wounds 0 []  - Dermatologic / Skin Assessment (not related to wound area) 0 ASSESSMENTS - Ostomy and/or Continence Assessment and Care []  - Incontinence Assessment and Management 0 []  - Ostomy Care Assessment and Management (repouching, etc.) 0 PROCESS - Coordination of Care []  - Simple Patient / Family Education for ongoing care 0 []  - Complex (extensive) Patient / Family Education for ongoing care 0 []  - Staff obtains Chiropractor, Records, Test Results / Process Orders 0 []  - Staff telephones HHA, Nursing Homes / Clarify orders / etc 0 []  - Routine Transfer to another Facility (non-emergent condition) 0 []  - Routine Hospital Admission (non-emergent condition) 0 []  - New Admissions / Manufacturing engineer / Ordering NPWT, Apligraf, etc. 0 []  - Emergency Hospital Admission (emergent condition) 0 X - Simple Discharge Coordination 1 10 [name] [name] ([id number]) []  - Complex (extensive) Discharge Coordination 0 PROCESS - Special Needs []  - Pediatric / Minor Patient Management 0 []  - Isolation Patient Management 0 []  - Hearing / Language / Visual special needs 0 []  - Assessment of Community assistance (transportation, D/C planning, etc.) 0 []  - Additional  assistance / Altered mentation 0 []  - Support Surface(s) Assessment (bed, cushion, seat, etc.) 0 INTERVENTIONS - Wound Cleansing / Measurement X - Wound Imaging (photographs - any number of wounds) 1 5 []  - Wound Tracing (instead of photographs) 0 X - Simple Wound Measurement - one wound 1 5 []  -  Complex Wound Measurement - multiple wounds 0 X - Simple Wound Cleansing - one wound 1 5 []  - Complex Wound Cleansing - multiple wounds 0 INTERVENTIONS - Wound Dressings []  - Small Wound Dressing one or multiple wounds 0 []  - Medium Wound Dressing one or multiple wounds 0 []  - Large Wound Dressing one or multiple wounds 0 []  - Application of Medications - injection 0 INTERVENTIONS - Miscellaneous []  - External ear exam 0 []  - Specimen Collection (cultures, biopsies, blood, body fluids, etc.) 0 []  - Specimen(s) / Culture(s) sent or taken to Lab for analysis 0 []  - Patient Transfer (multiple staff / Michiel Sites Lift / Similar devices) 0 []  - Simple Staple / Suture removal (25 or less) 0 []  - Complex Staple / Suture removal (26 or more) 0 [name] [name] ([id number]) []  - Hypo / Hyperglycemic Management (close monitor of Blood Glucose) 0 []  - Ankle / Brachial Index (ABI) - do not check if billed separately 0 Has the patient been seen at the hospital within the last three years: Yes Total Score: 75 Level Of Care: New/Established - Level 2 Electronic Signature(s) Signed: 04/11/2015 3:40:04 PM By: Elpidio Eric BSN, RN Entered By: Elpidio Eric on 04/11/2015 15:40:03 [name] [name] ([id number]) -------------------------------------------------------------------------------- Encounter Discharge Information Details Patient Name: [name] [name] Date of Service: 04/11/2015 2:30 PM Medical Record Number: [id number] Patient Account Number: [id number] Date of Birth/Sex: [birth year] (29 y.o. Male) Treating RN: Clover Mealy, RN, BSN, [place] Sink Primary Care Physician: Bethann Punches Other Clinician: Referring Physician: Governor Rooks Treating Physician/Extender: Rudene Re in Treatment: 0 Encounter Discharge Information Items Discharge Pain Level: 0 Discharge Condition: Stable Ambulatory Status: Wheelchair Discharge Destination: Home Transportation: Private Auto Accompanied By: mother Schedule Follow-up Appointment: No Medication Reconciliation completed and provided to Patient/Care No Latika Kronick: Provided on Clinical Summary of Care: 04/11/2015 Form Type Recipient Paper Patient ZI Electronic Signature(s) Signed: 04/11/2015 3:45:20 PM By: Elpidio Eric BSN, RN Previous Signature: 04/11/2015 3:38:31 PM Version By: Gwenlyn Perking Entered By: Elpidio Eric on 04/11/2015 15:45:20 [name] [name] ([id number]) -------------------------------------------------------------------------------- General Visit Notes Details Patient Name: [name] [name] Date of Service: 04/11/2015 2:30 PM Medical Record Number: [id number] Patient Account Number: [id number] Date of Birth/Sex: 06/19/1986 (29 y.o. Male) Treating RN: Clover Mealy, RN, BSN, [place] Sink Primary Care Physician: Bethann Punches Other Clinician: Referring Physician: Governor Rooks Treating Physician/Extender: Rudene Re in Treatment: 0 Notes Patient seen today for evaluation of irritation and redness around g-tube site. Patient mother instructed by MD to see the surgeon. And to apply zinc oxide as a barrier. They will call back as needed for follow up. Electronic Signature(s) Signed: 04/11/2015 3:36:23 PM By: Elpidio Eric BSN, RN Entered By: Elpidio Eric on 04/11/2015 15:36:23 [name] [name] ([id number]) -------------------------------------------------------------------------------- Lower Extremity Assessment Details Patient Name: [name] [name] Date of Service: 04/11/2015 2:30 PM Medical Record Number: [id number] Patient Account Number: [id number] Date of Birth/Sex: 11/26/1985 (29 y.o. Male) Treating RN: Clover Mealy, RN, BSN, [place] Sink Primary Care Physician: Bethann Punches Other  Clinician: Referring Physician: Governor Rooks Treating Physician/Extender: Rudene Re in Treatment: 0 Electronic Signature(s) Signed: 04/11/2015 3:07:33 PM By: Elpidio Eric BSN, RN Entered By: Elpidio Eric on 04/11/2015 15:07:33 [name] [name] ([id number]) -------------------------------------------------------------------------------- Multi Wound Chart Details Patient Name: [name] [name] Date of Service: 04/11/2015 2:30 PM Medical Record Number: [id number] Patient Account Number: [id number] Date of Birth/Sex: 1985/07/31 (29 y.o. Male) Treating RN: Clover Mealy, RN, BSN, [place] Sink Primary Care Physician: Bethann Punches Other Clinician: Referring Physician: Governor Rooks Treating Physician/Extender: Rudene Re in Treatment: 0 Vital Signs Height(in): Pulse(bpm): 64 Weight(lbs): Blood  Pressure 112/73 (mmHg): Body Mass Index(BMI): Temperature(F): 97.8 Respiratory Rate 18 (breaths/min): Photos: [1:No Photos] [N/A:N/A] Wound Location: [1:Left Abdomen - Upper Quadrant] [N/A:N/A] Wounding Event: [1:Gradually Appeared] [N/A:N/A] Primary Etiology: [1:Infection - not elsewhere classified] [N/A:N/A] Date Acquired: [1:03/27/2015] [N/A:N/A] Weeks of Treatment: [1:0] [N/A:N/A] Wound Status: [1:Open] [N/A:N/A] Measurements L x W x D 1.1x2x0.2 [N/A:N/A] (cm) Area (cm) : [1:1.728] [N/A:N/A] Volume (cm) : [1:0.346] [N/A:N/A] % Reduction in Area: [1:0.00%] [N/A:N/A] % Reduction in Volume: 0.00% [N/A:N/A] Classification: [1:Full Thickness Without Exposed Support Structures] [N/A:N/A] Exudate Amount: [1:Medium] [N/A:N/A] Exudate Type: [1:Serosanguineous] [N/A:N/A] Exudate Color: [1:red, brown] [N/A:N/A] Wound Margin: [1:Indistinct, nonvisible] [N/A:N/A] Granulation Amount: [1:Medium (34-66%)] [N/A:N/A] Granulation Quality: [1:Pink, Pale] [N/A:N/A] Necrotic Amount: [1:Small (1-33%)] [N/A:N/A] Exposed Structures: [1:Fascia: No Fat: No Tendon: No Muscle: No Joint: No] [N/A:N/A] Bone:  No Limited to Skin Breakdown Epithelialization: None N/A N/A Periwound Skin Texture: Edema: No N/A N/A Excoriation: No Induration: No Callus: No Crepitus: No Fluctuance: No Friable: No Rash: No Scarring: No Periwound Skin Moist: Yes N/A N/A Moisture: Maceration: No Dry/Scaly: No Periwound Skin Color: Erythema: Yes N/A N/A Atrophie Blanche: No Cyanosis: No Ecchymosis: No Hemosiderin Staining: No Mottled: No Pallor: No Rubor: No Erythema Location: Circumferential N/A N/A Temperature: No Abnormality N/A N/A Tenderness on No N/A N/A Palpation: Wound Preparation: Ulcer Cleansing: N/A N/A Rinsed/Irrigated with Saline Topical Anesthetic Applied: Other: lidocaine 4% Treatment Notes Electronic Signature(s) Signed: 04/11/2015 3:22:50 PM By: Elpidio Eric BSN, RN Entered By: Elpidio Eric on 04/11/2015 15:22:50 [name] [name] ([id number]) -------------------------------------------------------------------------------- Multi-Disciplinary Care Plan Details Patient Name: [name] [name] Date of Service: 04/11/2015 2:30 PM Medical Record Number: [id number] Patient Account Number: [id number] Date of Birth/Sex: Dec 11, 1985 (29 y.o. Male) Treating RN: Clover Mealy, RN, BSN, [place] Sink Primary Care Physician: Bethann Punches Other Clinician: Referring Physician: Governor Rooks Treating Physician/Extender: Rudene Re in Treatment: 0 Active Inactive Electronic Signature(s) Signed: 04/11/2015 3:47:25 PM By: Elpidio Eric BSN, RN Previous Signature: 04/11/2015 3:22:34 PM Version By: Elpidio Eric BSN, RN Entered By: Elpidio Eric on 04/11/2015 15:47:25 [name] [name] ([id number]) -------------------------------------------------------------------------------- Pain Assessment Details Patient Name: [name] [name] Date of Service: 04/11/2015 2:30 PM Medical Record Number: [id number] Patient Account Number: [id number] Date of Birth/Sex: 1986-04-24 (29 y.o. Male) Treating RN: Clover Mealy, RN, BSN, [place] Sink Primary Care  Physician: Bethann Punches Other Clinician: Referring Physician: Governor Rooks Treating Physician/Extender: Rudene Re in Treatment: 0 Active Problems Location of Pain Severity and Description of Pain Patient Has Paino Patient Unable to Respond Site Locations Pain Management and Medication Current Pain Management: Electronic Signature(s) Signed: 04/11/2015 3:05:04 PM By: Elpidio Eric BSN, RN Entered By: Elpidio Eric on 04/11/2015 15:05:04 [name] [name] ([id number]) -------------------------------------------------------------------------------- Patient/Caregiver Education Details Patient Name: [name] [name] Date of Service: 04/11/2015 2:30 PM Medical Record Number: [id number] Patient Account Number: [id number] Date of Birth/Gender: 12/29/1985 (29 y.o. Male) Treating RN: Clover Mealy, RN, BSN, [place] Sink Primary Care Physician: Bethann Punches Other Clinician: Referring Physician: Governor Rooks Treating Physician/Extender: Rudene Re in Treatment: 0 Education Assessment Education Provided To: Patient Education Topics Provided Basic Hygiene: Methods: Explain/Verbal Responses: State content correctly [facility]: Methods: Explain/Verbal Responses: State content correctly Wound/Skin Impairment: Methods: Explain/Verbal Responses: State content correctly Electronic Signature(s) Signed: 04/11/2015 3:46:46 PM By: Elpidio Eric BSN, RN Entered By: Elpidio Eric on 04/11/2015 15:46:46 [name] [name] ([id number]) -------------------------------------------------------------------------------- Vitals Details Patient Name: [name] [name] Date of Service: 04/11/2015 2:30 PM Medical Record Number: [id number] Patient Account Number: [id number] Date of Birth/Sex: June 27, 1986 (29 y.o. Male) Treating RN: Clover Mealy, RN, BSN, [place] Sink Primary Care Physician: Bethann Punches Other  Clinician: Referring Physician: Governor Rooks Treating Physician/Extender: Rudene Re in Treatment:  0 Vital Signs Time Taken: 15:07 Temperature (F): 97.8 Pulse (bpm): 64 Respiratory Rate (breaths/min): 18 Blood Pressure (mmHg): 112/73 Reference Range: 80 - 120 mg / dl Electronic Signature(s) Signed: 04/11/2015 3:07:26 PM By: Elpidio Eric BSN, RN Entered By: Elpidio Eric on 04/11/2015 15:07:25

## 2015-04-11 NOTE — Progress Notes (Signed)
[name] [name] ([id number]) Visit Report for 04/11/2015 Abuse/Suicide Risk Screen Details Patient Name: [name] [name], [name] [name] Date of Service: 04/11/2015 2:30 PM Medical Record Number: [id number] Patient Account Number: [id number] Date of Birth/Sex: 11/27/1985 (29 y.o. Male) Treating RN: Clover Mealy, RN, BSN, [place] Sink Primary Care Physician: Bethann Punches Other Clinician: Referring Physician: Governor Rooks Treating Physician/Extender: Rudene Re in Treatment: 0 Abuse/Suicide Risk Screen Items Answer ABUSE/SUICIDE RISK SCREEN: Has anyone close to you tried to hurt or harm you recentlyo No Do you feel uncomfortable with anyone in your familyo No Has anyone forced you do things that you didnot want to doo No Do you have any thoughts of harming yourselfo No Patient displays signs or symptoms of abuse and/or neglect. No Electronic Signature(s) Signed: 04/11/2015 3:36:36 PM By: Elpidio Eric BSN, RN Entered By: Elpidio Eric on 04/11/2015 15:36:36 [name] [name] ([id number]) -------------------------------------------------------------------------------- Activities of Daily Living Details Patient Name: [name] [name] Date of Service: 04/11/2015 2:30 PM Medical Record Number: [id number] Patient Account Number: [id number] Date of Birth/Sex: 1986/05/24 (29 y.o. Male) Treating RN: Clover Mealy, RN, BSN, [place] Sink Primary Care Physician: Bethann Punches Other Clinician: Referring Physician: Governor Rooks Treating Physician/Extender: Rudene Re in Treatment: 0 Activities of Daily Living Items Answer Activities of Daily Living (Please select one for each item) Drive Automobile Not Able Take Medications Not Able Use Telephone Not Able Care for Appearance Not Able Use Toilet Need Assistance Bath / Shower Not Able Dress Self Not Able Feed Self Need Assistance Walk Not Able Get In / Out Bed Not Able Housework Not Able Prepare Meals Not Able Handle Money Not Able Shop for Self Not Able Electronic  Signature(s) Signed: 04/11/2015 3:37:18 PM By: Elpidio Eric BSN, RN Entered By: Elpidio Eric on 04/11/2015 15:37:18 [name] [name] ([id number]) -------------------------------------------------------------------------------- Education Assessment Details Patient Name: [name] [name] Date of Service: 04/11/2015 2:30 PM Medical Record Number: [id number] Patient Account Number: [id number] Date of Birth/Sex: 1986-07-24 (29 y.o. Male) Treating RN: Clover Mealy, RN, BSN, [place] Sink Primary Care Physician: Bethann Punches Other Clinician: Referring Physician: Governor Rooks Treating Physician/Extender: Rudene Re in Treatment: 0 Primary Learner Assessed: Caregiver Reason Patient is not Primary Learner: mother Learning Preferences/Education Level/Primary Language Preferred Language: English Cognitive Barrier Assessment/Beliefs Language Barrier: No Physical Barrier Assessment Impaired Vision: No Impaired Hearing: No Decreased Hand dexterity: No Knowledge/Comprehension Assessment Knowledge Level: Medium Comprehension Level: Medium Ability to understand written Medium instructions: Ability to understand verbal Medium instructions: Motivation Assessment Anxiety Level: Calm Cooperation: Cooperative Education Importance: Acknowledges Need Interest in Health Problems: Asks Questions Perception: Coherent Willingness to Engage in Self- Medium Management Activities: Readiness to Engage in Self- Medium Management Activities: Electronic Signature(s) Signed: 04/11/2015 3:38:31 PM By: Elpidio Eric BSN, RN Entered By: Elpidio Eric on 04/11/2015 15:38:31 [name] [name] ([id number]) -------------------------------------------------------------------------------- Fall Risk Assessment Details Patient Name: [name] [name] Date of Service: 04/11/2015 2:30 PM Medical Record Number: [id number] Patient Account Number: [id number] Date of Birth/Sex: 07/15/1986 (29 y.o. Male) Treating RN: Clover Mealy, RN, BSN, [place] Sink Primary Care  Physician: Bethann Punches Other Clinician: Referring Physician: Governor Rooks Treating Physician/Extender: Rudene Re in Treatment: 0 Fall Risk Assessment Items FALL RISK ASSESSMENT: History of falling - immediate or within 3 months 0 No Secondary diagnosis 15 Yes Ambulatory aid None/bed rest/wheelchair/nurse 0 Yes Crutches/cane/walker 0 No Furniture 0 No IV Access/Saline Lock 0 No Gait/Training Normal/bed rest/immobile 0 Yes Weak 0 No Impaired 20 Yes Mental Status Oriented to own ability 0 Yes Electronic Signature(s) Signed: 04/11/2015 3:38:57 PM By: Elpidio Eric BSN, RN Entered By: Elpidio Eric on 04/11/2015 15:38:57 [name] [name] (  [id number]) -------------------------------------------------------------------------------- Foot Assessment Details Patient Name: [name], [name] Date of Service: 04/11/2015 2:30 PM Medical Record Number: [id number] Patient Account Number: [id number] Date of Birth/Sex: October 05, 1985 (29 y.o. Male) Treating RN: Clover Mealy, RN, BSN, [place] Sink Primary Care Physician: Bethann Punches Other Clinician: Referring Physician: Governor Rooks Treating Physician/Extender: Rudene Re in Treatment: 0 Foot Assessment Items Site Locations + = Sensation present, - = Sensation absent, C = Callus, U = Ulcer R = Redness, W = Warmth, M = Maceration, PU = Pre-ulcerative lesion F = Fissure, S = Swelling, D = Dryness Assessment Right: Left: Other Deformity: No No Prior Foot Ulcer: No No Prior Amputation: No No Charcot Joint: No No Ambulatory Status: Non-ambulatory Assistance Device: Wheelchair Gait: Electronic Signature(s) Signed: 04/11/2015 3:39:18 PM By: Elpidio Eric BSN, RN Entered By: Elpidio Eric on 04/11/2015 15:39:18 [name] [name] ([id number]) -------------------------------------------------------------------------------- Nutrition Risk Assessment Details Patient Name: [name] [name] Date of Service: 04/11/2015 2:30 PM Medical Record Number: [id number] Patient  Account Number: [id number] Date of Birth/Sex: 07-13-1986 (29 y.o. Male) Treating RN: Clover Mealy, RN, BSN, [place] Primary Care Physician: Bethann Punches Other Clinician: Referring Physician: Governor Rooks Treating Physician/Extender: Rudene Re in Treatment: 0 Height (in): Weight (lbs): Body Mass Index (BMI): Nutrition Risk Assessment Items NUTRITION RISK SCREEN: I have an illness or condition that made me change the kind and/or 0 No amount of food I eat I eat fewer than two meals per day 0 No I eat few fruits and vegetables, or milk products 0 No I have three or more drinks of beer, liquor or wine almost every day 0 No I have tooth or mouth problems that make it hard for me to eat 0 No I don't always have enough money to buy the food I need 0 No I eat alone most of the time 0 No I take three or more different prescribed or over-the-counter drugs a 0 No day Without wanting to, I have lost or gained 10 pounds in the last six 0 No months I am not always physically able to shop, cook and/or feed myself 0 No Nutrition Protocols Good Risk Protocol 0 No interventions needed Moderate Risk Protocol Electronic Signature(s) Signed: 04/11/2015 3:39:05 PM By: Elpidio Eric BSN, RN Entered By: Elpidio Eric on 04/11/2015 15:39:04

## 2015-04-11 NOTE — Progress Notes (Addendum)
BLESS, BELSHE ([id number]) Visit Report for 04/11/2015 Chief Complaint Document Details Patient Name: [name] [name], [name] [name] Date of Service: 04/11/2015 2:30 PM Medical Record Number: [id number] Patient Account Number: [id number] Date of Birth/Sex: [birth year] (29 y.o. Male) Treating RN: Clover Mealy, RN, BSN, [place] Primary Care Physician: Bethann Punches Other Clinician: Referring Physician: Governor Rooks Treating Physician/Extender: Rudene Re in Treatment: 0 Information Obtained from: Caregiver Chief Complaint Patient presents to the wound care center for a consult due non healing wound. Redness and open wound next to his gastrostomy which she's had for about 2 weeks now Electronic Signature(s) Signed: 04/11/2015 3:44:08 PM By: Evlyn Kanner MD, FACS Entered By: Evlyn Kanner on 04/11/2015 15:44:08 [name] [name] ([id number]) -------------------------------------------------------------------------------- HPI Details Patient Name: [name] [name] Date of Service: 04/11/2015 2:30 PM Medical Record Number: [id number] Patient Account Number: [id number] Date of Birth/Sex: [birth year] (29 y.o. Male) Treating RN: Clover Mealy, RN, BSN, [place] Sink Primary Care Physician: Bethann Punches Other Clinician: Referring Physician: Governor Rooks Treating Physician/Extender: Rudene Re in Treatment: 0 History of Present Illness Location: area around his gastrostomy tube Quality: Patient reports No Pain. Severity: Patient states wound are getting worse. Duration: Patient has had the wound for < 2 weeks prior to presenting for treatment Context: The wound appeared gradually over time Modifying Factors: Other treatment(s) tried include:zinc oxide paste Associated Signs and Symptoms: Wound has gastric drainage been quite profuse from this area HPI Description: 29 year old male who has a history of traumatic brain injury and also has a redness around his G-tube which was noted by the family about a week. the TBI was in January  2013 and prior to this visit the patient has had hyperbaric oxygen therapy approximately 2 years after his TBI. He is had several gastrostomy tube placed by the gastroenterologist but then finally had a surgical procedure to bring the gastric mucosa to the skin surface. This was done approximately 9 months ago. Past medical history of hepatitis C, traumatic brain injury, pulmonary emboli, ileostomy, tracheostomy, PEG tube placement. upon noting his medication he is on the Collis and on clindamycin beside her list of several other medications. was a former smoker and quit in January 2013 Electronic Signature(s) Signed: 04/11/2015 3:46:51 PM By: Evlyn Kanner MD, FACS Previous Signature: 04/11/2015 3:13:13 PM Version By: Evlyn Kanner MD, FACS Entered By: Evlyn Kanner on 04/11/2015 15:46:51 [name] [name] ([id number]) -------------------------------------------------------------------------------- Physical Exam Details Patient Name: [name] [name] Date of Service: 04/11/2015 2:30 PM Medical Record Number: [id number] Patient Account Number: [id number] Date of Birth/Sex: [birth year] (29 y.o. Male) Treating RN: Clover Mealy, RN, BSN, [place] Sink Primary Care Physician: Bethann Punches Other Clinician: Referring Physician: Governor Rooks Treating Physician/Extender: Rudene Re in Treatment: 0 Constitutional . Pulse regular. Respirations normal and unlabored. Afebrile. wheelchair-bound and with quadriplegia. Eyes Nonicteric. Reactive to light. Ears, Nose, Mouth, and Throat Lips, teeth, and gums WNL.Marland Kitchen Moist mucosa without lesions . Neck supple and nontender. No palpable supraclavicular or cervical adenopathy. Normal sized without goiter. Respiratory WNL. No retractions.. Cardiovascular Pedal Pulses WNL. No clubbing, cyanosis or edema. Gastrointestinal (GI) he has excoriation around his gastric tube and there is gastric mucosa. From the 12:00 to the 6:00 o'clock position he has an open wound which has  minimal slough.. Genitourinary (GU) No hydrocele, spermatocele, tenderness of the cord, or testicular mass.Marland Kitchen Penis without lesions.Renetta Chalk without lesions. No cystocele, or rectocele. Pelvic support intact, no discharge. Marland Kitchen Urethra without masses, tenderness or scarring.Marland Kitchen Lymphatic No adneopathy. No adenopathy. No adenopathy. Musculoskeletal Adexa without tenderness or enlargement.. Digits and nails w/o clubbing, cyanosis, infection,  petechiae, ischemia, or inflammatory conditions.. Integumentary (Hair, Skin) No suspicious lesions. No crepitus or fluctuance. No peri-wound warmth or erythema. No masses.Marland Kitchen Psychiatric Judgement and insight Intact.. No evidence of depression, anxiety, or agitation.. Electronic Signature(s) Signed: 04/11/2015 3:48:46 PM By: Evlyn Kanner MD, FACS Entered By: Evlyn Kanner on 04/11/2015 15:48:45 [name] [name] ([id number]) -------------------------------------------------------------------------------- Physician Orders Details Patient Name: [name] [name] Date of Service: 04/11/2015 2:30 PM Medical Record Number: [id number] Patient Account Number: [id number] Date of Birth/Sex: 1986/04/11 (29 y.o. Male) Treating RN: Clover Mealy, RN, BSN, [place] Sink Primary Care Physician: Bethann Punches Other Clinician: Referring Physician: Governor Rooks Treating Physician/Extender: Rudene Re in Treatment: 0 Verbal / Phone Orders: Yes Clinician: Afful, RN, BSN, Rita Read Back and Verified: Yes Diagnosis Coding Skin Barriers/Peri-Wound Care o Barrier cream Discharge From [facility] Services o Discharge from Wound Care Center - Consult. Electronic Signature(s) Signed: 04/11/2015 3:32:30 PM By: Elpidio Eric BSN, RN Signed: 04/11/2015 4:28:23 PM By: Evlyn Kanner MD, FACS Entered By: Elpidio Eric on 04/11/2015 15:32:29 [name] [name] ([id number]) -------------------------------------------------------------------------------- Problem List Details Patient Name: [name] [name] Date of  Service: 04/11/2015 2:30 PM Medical Record Number: [id number] Patient Account Number: [id number] Date of Birth/Sex: 02-15-1986 (29 y.o. Male) Treating RN: Clover Mealy, RN, BSN, [place] Sink Primary Care Physician: Bethann Punches Other Clinician: Referring Physician: Governor Rooks Treating Physician/Extender: Rudene Re in Treatment: 0 Active Problems ICD-10 Encounter Code Description Active Date Diagnosis K94.20 Gastrostomy complication, unspecified 04/11/2015 Yes S31.101A Unspecified open wound of abdominal wall, left upper 04/11/2015 Yes quadrant without penetration into peritoneal cavity, initial encounter Inactive Problems Resolved Problems Electronic Signature(s) Signed: 04/11/2015 3:43:22 PM By: Evlyn Kanner MD, FACS Entered By: Evlyn Kanner on 04/11/2015 15:43:22 [name] [name] ([id number]) -------------------------------------------------------------------------------- Progress Note Details Patient Name: [name] [name] Date of Service: 04/11/2015 2:30 PM Medical Record Number: [id number] Patient Account Number: [id number] Date of Birth/Sex: 1985/08/22 (29 y.o. Male) Treating RN: Clover Mealy, RN, BSN, [place] Primary Care Physician: Bethann Punches Other Clinician: Referring Physician: Governor Rooks Treating Physician/Extender: Rudene Re in Treatment: 0 Subjective Chief Complaint Information obtained from Caregiver Patient presents to the wound care center for a consult due non healing wound. Redness and open wound next to his gastrostomy which she's had for about 2 weeks now History of Present Illness (HPI) The following HPI elements were documented for the patient's wound: Location: area around his gastrostomy tube Quality: Patient reports No Pain. Severity: Patient states wound are getting worse. Duration: Patient has had the wound for < 2 weeks prior to presenting for treatment Context: The wound appeared gradually over time Modifying Factors: Other treatment(s) tried  include:zinc oxide paste Associated Signs and Symptoms: Wound has gastric drainage been quite profuse from this area 29 year old male who has a history of traumatic brain injury and also has a redness around his G-tube which was noted by the family about a week. the TBI was in January 2013 and prior to this visit the patient has had hyperbaric oxygen therapy approximately 2 years after his TBI. He is had several gastrostomy tube placed by the gastroenterologist but then finally had a surgical procedure to bring the gastric mucosa to the skin surface. This was done approximately 9 months ago. Past medical history of hepatitis C, traumatic brain injury, pulmonary emboli, ileostomy, tracheostomy, PEG tube placement. upon noting his medication he is on the Collis and on clindamycin beside her list of several other medications. was a former smoker and quit in January 2013 Wound History Patient presents with 1 open wound that has been present for approximately month.  Patient has been treating wound in the following manner: bacitracin. Laboratory tests have not been performed in the last month. Patient reportedly has not tested positive for an antibiotic resistant organism. Patient reportedly has not tested positive for osteomyelitis. Patient reportedly has not had testing performed to evaluate circulation in the legs. Patient experiences the following problems associated with their wounds: infection. Patient History Information obtained from Caregiver, Chart, . Allergies [place], Santhiago ([id number]) Ambien, ativan, Depakote, dilauded, Keppra Social History Former smoker, Marital Status - Single, Alcohol Use - Never, Drug Use - No History, Caffeine Use - Never. Medical History Eyes Denies history of Cataracts, Glaucoma, Optic Neuritis Ear/Nose/Mouth/Throat Denies history of Chronic sinus problems/congestion, Middle ear problems Hematologic/Lymphatic Denies history of Anemia, Hemophilia, Human  Immunodeficiency Virus, Lymphedema, Sickle Cell Disease Cardiovascular Patient has history of Hypertension Gastrointestinal Patient has history of Hepatitis C Integumentary (Skin) Denies history of History of Burn, History of pressure wounds Musculoskeletal Denies history of Gout, Rheumatoid Arthritis, Osteoarthritis, Osteomyelitis Neurologic Patient has history of Paraplegia - heterotopic Denies history of Dementia, Neuropathy Oncologic Denies history of Received Chemotherapy, Received Radiation Psychiatric Denies history of Anorexia/bulimia, Confinement Anxiety Medical And Surgical History Notes Respiratory lung tube, trachesotomy, Genitourinary foley Review of Systems (ROS) Constitutional Symptoms (General Health) The patient has no complaints or symptoms. Eyes The patient has no complaints or symptoms. Ear/Nose/Mouth/Throat The patient has no complaints or symptoms. Hematologic/Lymphatic The patient has no complaints or symptoms. Respiratory The patient has no complaints or symptoms. Gastrointestinal g- tube Endocrine The patient has no complaints or symptoms. Genitourinary [name] [name] ([id number]) Complains or has symptoms of Incontinence/dribbling. Immunological The patient has no complaints or symptoms. Integumentary (Skin) Complains or has symptoms of Wounds, Breakdown. Musculoskeletal Complains or has symptoms of Muscle Weakness, scoliosis Neurologic The patient has no complaints or symptoms, subarachnoid hemorrhage Oncologic The patient has no complaints or symptoms. Psychiatric The patient has no complaints or symptoms, depression Medications cranberry oral capsules oral gabapentin 300 mg capsule oral 2 2 capsule oral for three times daily Vimpat 200 mg tablet oral 1 1 tablet oral olanzapine 15 mg tablet oral 1 1 tablet oral albuterol sulfate 2.5 mg/3 mL (0.083 %) solution for nebulization inhalation solution for nebulization inhalation Jevity 1.5  Cal 0.06 gram-1.5 kcal/mL oral liquid oral liquid oral Eliquis 5 mg tablet oral 1 1 tablet oral clindamycin 150 mg capsule oral 3 3 capsule oral multivitamin tablet oral 1 1 tablet oral indomethacin ER 75 mg capsule,extended release oral 1 1 capsule, extended release oral pantoprazole 40 mg tablet,delayed release oral 1 1 tablet,delayed release (DR/EC) oral baclofen 20 mg tablet oral 1 1 tablet oral Objective Constitutional Pulse regular. Respirations normal and unlabored. Afebrile. wheelchair-bound and with quadriplegia. Vitals Time Taken: 3:07 PM, Temperature: 97.8 F, Pulse: 64 bpm, Respiratory Rate: 18 breaths/min, Blood Pressure: 112/73 mmHg. Eyes Nonicteric. Reactive to light. Ears, Nose, Mouth, and Throat [name] [name], [name] [name] ([id number]) Lips, teeth, and gums WNL.Marland Kitchen Moist mucosa without lesions . Neck supple and nontender. No palpable supraclavicular or cervical adenopathy. Normal sized without goiter. Respiratory WNL. No retractions.. Cardiovascular Pedal Pulses WNL. No clubbing, cyanosis or edema. Gastrointestinal (GI) he has excoriation around his gastric tube and there is gastric mucosa. From the 12:00 to the 6:00 o'clock position he has an open wound which has minimal slough.. Genitourinary (GU) No hydrocele, spermatocele, tenderness of the cord, or testicular mass.Marland Kitchen Penis without lesions.Renetta Chalk without lesions. No cystocele, or rectocele. Pelvic support intact, no discharge. Marland Kitchen Urethra without masses, tenderness or scarring.Marland Kitchen Lymphatic No adneopathy.  No adenopathy. No adenopathy. Musculoskeletal Adexa without tenderness or enlargement.. Digits and nails w/o clubbing, cyanosis, infection, petechiae, ischemia, or inflammatory conditions.Marland Kitchen Psychiatric Judgement and insight Intact.. No evidence of depression, anxiety, or agitation.. Integumentary (Hair, Skin) No suspicious lesions. No crepitus or fluctuance. No peri-wound warmth or erythema. No masses.. Assessment Active  Problems ICD-10 K94.20 - Gastrostomy complication, unspecified S31.101A - Unspecified open wound of abdominal wall, left upper quadrant without penetration into peritoneal cavity, initial encounter [name] [name], [name] [name] ([id number]) The patient's caregiver is his sister and mother. I had a frank discussion with them regarding the need for a surgical opinion from the surgeon was done the procedure, as it is difficult to control the flow of gastric secretions which are seeping out between the balloon and the external bolster. there are already using zinc oxide paste and I have recommended that they could try a wafer of duodenum and stoma paste to try and keep the skin dry. Adjustment of the external bolster frequently will also help. The caregivers are welcome to bring him back as often as need be but a surgical opinion has been initiated. All questions answered. Plan Skin Barriers/Peri-Wound Care: Barrier cream Discharge From [facility] Services: Discharge from Wound Care Center - Consult. The patient's caregiver is his sister and mother. I had a frank discussion with them regarding the need for a surgical opinion from the surgeon was done the procedure, as it is difficult to control the flow of gastric secretions which are seeping out between the balloon and the external bolster. there are already using zinc oxide paste and I have recommended that they could try a wafer of duodenum and stoma paste to try and keep the skin dry. Adjustment of the external bolster frequently will also help. The caregivers are welcome to bring him back as often as need be but a surgical opinion has been initiated. All questions answered. Electronic Signature(s) Signed: 04/11/2015 3:51:43 PM By: Evlyn Kanner MD, FACS Entered By: Evlyn Kanner on 04/11/2015 15:51:43 [name] [name] ([id number]) -------------------------------------------------------------------------------- ROS/PFSH Details Patient Name: [name] [name] Date of  Service: 04/11/2015 2:30 PM Medical Record Number: [id number] Patient Account Number: [id number] Date of Birth/Sex: 1986-04-23 (29 y.o. Male) Treating RN: Clover Mealy, RN, BSN, [place] Sink Primary Care Physician: Bethann Punches Other Clinician: Referring Physician: Governor Rooks Treating Physician/Extender: Rudene Re in Treatment: 0 Information Obtained From Caregiver Chart Other: Wound History Do you currently have one or more open woundso Yes How many open wounds do you currently haveo 1 Approximately how long have you had your woundso month How have you been treating your wound(s) until nowo bacitracin Has your wound(s) ever healed and then re-openedo No Have you had any lab work done in the past montho No Have you tested positive for an antibiotic resistant organism (MRSA, VRE)o No Have you tested positive for osteomyelitis (bone infection)o No Have you had any tests for circulation on your legso No Have you had other problems associated with your woundso Infection Genitourinary Complaints and Symptoms: Positive for: Incontinence/dribbling Medical History: Past Medical History Notes: foley Integumentary (Skin) Complaints and Symptoms: Positive for: Wounds; Breakdown Medical History: Negative for: History of Burn; History of pressure wounds Musculoskeletal Complaints and Symptoms: Positive for: Muscle Weakness Review of System Notes: scoliosis Medical History: Negative for: Gout; Rheumatoid Arthritis; Osteoarthritis; Osteomyelitis [name] [name], [name] [name] ([id number]) Constitutional Symptoms (General Health) Complaints and Symptoms: No Complaints or Symptoms Eyes Complaints and Symptoms: No Complaints or Symptoms Medical History: Negative for: Cataracts; Glaucoma; Optic Neuritis Ear/Nose/Mouth/Throat Complaints and Symptoms: No Complaints or  Symptoms Medical History: Negative for: Chronic sinus problems/congestion; Middle ear problems Hematologic/Lymphatic Complaints and  Symptoms: No Complaints or Symptoms Medical History: Negative for: Anemia; Hemophilia; Human Immunodeficiency Virus; Lymphedema; Sickle Cell Disease Respiratory Complaints and Symptoms: No Complaints or Symptoms Medical History: Past Medical History Notes: lung tube, trachesotomy, Cardiovascular Medical History: Positive for: Hypertension Gastrointestinal Complaints and Symptoms: Review of System Notes: g- tube Medical History: Positive for: Hepatitis C Endocrine [name] [name], [name] [name] ([id number]) Complaints and Symptoms: No Complaints or Symptoms Immunological Complaints and Symptoms: No Complaints or Symptoms Neurologic Complaints and Symptoms: No Complaints or Symptoms Complaints and Symptoms: Review of System Notes: subarachnoid hemorrhage Medical History: Positive for: Paraplegia - heterotopic Negative for: Dementia; Neuropathy Oncologic Complaints and Symptoms: No Complaints or Symptoms Medical History: Negative for: Received Chemotherapy; Received Radiation Psychiatric Complaints and Symptoms: No Complaints or Symptoms Complaints and Symptoms: Review of System Notes: depression Medical History: Negative for: Anorexia/bulimia; Confinement Anxiety Family and Social History Former smoker; Marital Status - Single; Alcohol Use: Never; Drug Use: No History; Caffeine Use: Never; Financial Concerns: No; Food, Clothing or Shelter Needs: No; Support System Lacking: No; Advanced Directives: No; Patient does not want information on Advanced Directives; Living Will: No Physician Affirmation I have reviewed and agree with the above information. Electronic Signature(s) Signed: 04/11/2015 3:34:36 PM By: Evlyn Kanner MD, [name] [name] ([id number]) Signed: 04/11/2015 4:45:16 PM By: Elpidio Eric BSN, RN Previous Signature: 04/11/2015 3:18:46 PM Version By: Elpidio Eric BSN, RN Entered By: Evlyn Kanner on 04/11/2015 15:34:35 [name] [name]  ([id number]) -------------------------------------------------------------------------------- SuperBill Details Patient Name: [name] [name] Date of Service: 04/11/2015 Medical Record Number: [id number] Patient Account Number: [id number] Date of Birth/Sex: 1986/05/24 (29 y.o. Male) Treating RN: Clover Mealy, RN, BSN, [place] Sink Primary Care Physician: Bethann Punches Other Clinician: Referring Physician: Governor Rooks Treating Physician/Extender: Rudene Re in Treatment: 0 Diagnosis Coding ICD-10 Codes Code Description K94.20 Gastrostomy complication, unspecified Unspecified open wound of abdominal wall, left upper quadrant without penetration into S31.101A peritoneal cavity, initial encounter Facility Procedures CPT4 Code: [id number] Description: 25366 - WOUND CARE VISIT-LEV 2 EST PT Modifier: Quantity: 1 Physician Procedures CPT4: Description Modifier Quantity Code 4403474 99204 - WC PHYS LEVEL 4 - NEW PT 1 ICD-10 Description Diagnosis K94.20 Gastrostomy complication, unspecified S31.101A Unspecified open wound of abdominal wall, left upper quadrant without penetration into  peritoneal cavity, initial encounter Electronic Signature(s) Signed: 04/11/2015 3:51:57 PM By: Evlyn Kanner MD, FACS Entered By: Evlyn Kanner on 04/11/2015 15:51:56

## 2015-04-12 ENCOUNTER — Encounter: Payer: Self-pay | Admitting: General Surgery

## 2015-04-12 ENCOUNTER — Ambulatory Visit (INDEPENDENT_AMBULATORY_CARE_PROVIDER_SITE_OTHER): Payer: BLUE CROSS/BLUE SHIELD | Admitting: General Surgery

## 2015-04-12 VITALS — BP 104/60 | HR 72 | Resp 16 | Ht 72.0 in | Wt 195.0 lb

## 2015-04-12 DIAGNOSIS — K9423 Gastrostomy malfunction: Secondary | ICD-10-CM

## 2015-04-12 NOTE — Progress Notes (Signed)
Patient ID: [name] [name], male   DOB: 1985-08-14, 29 y.o.   MRN: [id number]  Chief Complaint  Patient presents with  . Follow-up    Gtube     HPI [name] [name] is a 29 y.o. male here today for an evaluation of his PEG tube. The patient's mother noticied the skin around the tube was inflamed and red on 04/06/15. She took her son to [facility] urgent care on 04/08/15 in the morning, cultures of the area were taken and per mother came back as yeast. She then took her son to  ER at [facility] on 04/08/15 and prescribed sulfa, which he is still taking. Per mother there has been no change. The patient's aspiration symptoms have resolved. His sister had replaced the tube and they've noted that the balloon is exposed at the surface. HPI  Past Medical History  Diagnosis Date  . Hepatitis C   . Scoliosis   . 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 Subarachnoid hemorrhage   . Pulmonary emboli   . Hepatitis C   . Depression     Past Surgical History  Procedure Laterality Date  . Fracture surgery      left acetabulum  . Ileostomy    . Lung tube    . Ankle surgery Bilateral   . Tracheostomy    . Peg tube placement    . Janeway gastrostomy  07/02/2014    Family History  Problem Relation Age of Onset  . Hypertension      Social History Social History  Substance Use Topics  . Smoking status: Former Smoker    Quit date: 08/07/2011  . Smokeless tobacco: Never Used  . Alcohol Use: No    Allergies  Allergen Reactions  . Ambien [Zolpidem] Other (See Comments)    Patient start hallucinating.   . Ativan [Lorazepam] Itching and Other (See Comments)    Patient gets hot and sweaty. Hallucinations   . Depakote Er [Divalproex Sodium Er] Itching and Other (See Comments)    Patient gets hot and sweaty.  . Dilaudid [Hydromorphone] Hives and Other (See Comments)    Patient gets hot and sweaty.  Marland Kitchen Keppra [Levetiracetam] Itching and Other (See Comments)    Patient gets hot and sweaty.    Current  Outpatient Prescriptions  Medication Sig Dispense Refill  . albuterol (PROVENTIL) (2.5 MG/3ML) 0.083% nebulizer solution Inhale 3 mLs into the lungs 2 (two) times daily as needed.    Marland Kitchen apixaban (ELIQUIS) 5 MG TABS tablet Take 5 mg by mouth 2 (two) times daily.    . baclofen (LIORESAL) 20 MG tablet Take 20 mg by mouth 3 (three) times daily.    . baclofen (LIORESAL) 20 MG tablet TAKE 1 TABLET (20 MG) BY MOUTH 3 TIMES DAILY.    . clindamycin (CLEOCIN) 150 MG capsule Take 3 capsules (450 mg total) by mouth 3 (three) times daily. 90 capsule 0  . Cranberry 1000 MG CAPS Take 1,000 mg by mouth daily as needed.     . docusate sodium (COLACE) 100 MG capsule Take 100 mg by mouth 2 (two) times daily.     Marland Kitchen gabapentin (NEURONTIN) 300 MG capsule Take 600 mg by mouth 3 (three) times daily.     . indomethacin (INDOCIN SR) 75 MG CR capsule Take 75 mg by mouth daily.    Marland Kitchen lacosamide (VIMPAT) 200 MG TABS tablet Take 200 mg by mouth 2 (two) times daily.    . Multiple Vitamins-Minerals (MULTIVITAMIN WITH MINERALS) tablet Take 1 tablet  by mouth daily.    . Nutritional Supplements (FEEDING SUPPLEMENT, JEVITY 1.5 CAL/FIBER,) LIQD Place 237 mLs into feeding tube 5 (five) times daily.    Marland Kitchen nystatin (MYCOSTATIN/NYSTOP) 100000 UNIT/GM POWD Apply topically.    Marland Kitchen OLANZapine (ZYPREXA) 15 MG tablet Take 15 mg by mouth at bedtime.    . pantoprazole (PROTONIX) 40 MG tablet Take 40 mg by mouth 2 (two) times daily.     . propranolol ER (INDERAL LA) 60 MG 24 hr capsule Take 60 mg by mouth daily.    . risedronate (ACTONEL) 35 MG tablet Take 35 mg by mouth every 7 (seven) days. with water on empty stomach, nothing by mouth or lie down for next 30 minutes. Take on Saturday.    . sulfamethoxazole-trimethoprim (BACTRIM DS,SEPTRA DS) 800-160 MG per tablet   0  . Water For Irrigation, Sterile (FREE WATER) SOLN Place 300 mLs into feeding tube every 6 (six) hours. 1000 mL 11   No current facility-administered medications for this visit.     Review of Systems Review of Systems  Constitutional: Negative.   Respiratory: Positive for wheezing.   Cardiovascular: Negative.     Blood pressure 104/60, pulse 72, resp. rate 16, height 6' (1.829 m), weight 195 lb (88.451 kg).  Physical Exam Physical Exam  Abdominal:      Data Reviewed ED record.  Assessment    Improper tube placement.    Plan    The family was instructed on proper tube placement. With the balloon deflated and the retainer ring brought fully back against the hub of the catheter it should be fully inserted, the balloon inflated and then gently pulled back until resistance is met. This will be at the 6 cm mark. The tube was taped in this location. Desitin cream or a and D ointment should be applied to the irritated skin to minimize further gastric acid exposure. Follow-up will be on an as-needed basis.  A prescription for paper tape was provided.     PCP: Fanny Skates 04/13/2015, 2:01 PM

## 2015-04-12 NOTE — Patient Instructions (Signed)
Put Desitin on area to encourage healing.

## 2015-04-13 ENCOUNTER — Encounter: Payer: Self-pay | Admitting: General Surgery

## 2015-05-24 ENCOUNTER — Inpatient Hospital Stay
Admission: EM | Admit: 2015-05-24 | Discharge: 2015-05-29 | DRG: 205 | Disposition: A | Discharge status: Home / Self Care | Payer: BLUE CROSS/BLUE SHIELD | Attending: Internal Medicine | Admitting: Internal Medicine

## 2015-05-24 ENCOUNTER — Emergency Department: Payer: BLUE CROSS/BLUE SHIELD

## 2015-05-24 DIAGNOSIS — S069XAA Unspecified intracranial injury with loss of consciousness status unknown, initial encounter: Secondary | ICD-10-CM | POA: Diagnosis present

## 2015-05-24 DIAGNOSIS — G4733 Obstructive sleep apnea (adult) (pediatric): Secondary | ICD-10-CM | POA: Diagnosis present

## 2015-05-24 DIAGNOSIS — A498 Other bacterial infections of unspecified site: Secondary | ICD-10-CM

## 2015-05-24 DIAGNOSIS — J9809 Other diseases of bronchus, not elsewhere classified: Secondary | ICD-10-CM | POA: Diagnosis present

## 2015-05-24 DIAGNOSIS — Z9989 Dependence on other enabling machines and devices: Secondary | ICD-10-CM

## 2015-05-24 DIAGNOSIS — L89623 Pressure ulcer of left heel, stage 3: Secondary | ICD-10-CM | POA: Diagnosis present

## 2015-05-24 DIAGNOSIS — J9600 Acute respiratory failure, unspecified whether with hypoxia or hypercapnia: Secondary | ICD-10-CM | POA: Diagnosis present

## 2015-05-24 DIAGNOSIS — R569 Unspecified convulsions: Secondary | ICD-10-CM

## 2015-05-24 DIAGNOSIS — T17500A Unspecified foreign body in bronchus causing asphyxiation, initial encounter: Secondary | ICD-10-CM | POA: Diagnosis present

## 2015-05-24 DIAGNOSIS — R0902 Hypoxemia: Secondary | ICD-10-CM | POA: Diagnosis not present

## 2015-05-24 DIAGNOSIS — T17590A Other foreign object in bronchus causing asphyxiation, initial encounter: Secondary | ICD-10-CM | POA: Diagnosis not present

## 2015-05-24 DIAGNOSIS — X58XXXA Exposure to other specified factors, initial encounter: Secondary | ICD-10-CM | POA: Diagnosis present

## 2015-05-24 DIAGNOSIS — R1310 Dysphagia, unspecified: Secondary | ICD-10-CM | POA: Diagnosis present

## 2015-05-24 DIAGNOSIS — Z87820 Personal history of traumatic brain injury: Secondary | ICD-10-CM

## 2015-05-24 DIAGNOSIS — Z86711 Personal history of pulmonary embolism: Secondary | ICD-10-CM

## 2015-05-24 DIAGNOSIS — A047 Enterocolitis due to Clostridium difficile: Secondary | ICD-10-CM | POA: Diagnosis present

## 2015-05-24 DIAGNOSIS — K5900 Constipation, unspecified: Secondary | ICD-10-CM

## 2015-05-24 DIAGNOSIS — B1920 Unspecified viral hepatitis C without hepatic coma: Secondary | ICD-10-CM | POA: Diagnosis present

## 2015-05-24 DIAGNOSIS — R509 Fever, unspecified: Secondary | ICD-10-CM | POA: Diagnosis present

## 2015-05-24 DIAGNOSIS — Z888 Allergy status to other drugs, medicaments and biological substances status: Secondary | ICD-10-CM

## 2015-05-24 DIAGNOSIS — G8250 Quadriplegia, unspecified: Secondary | ICD-10-CM | POA: Diagnosis present

## 2015-05-24 DIAGNOSIS — S069X9A Unspecified intracranial injury with loss of consciousness of unspecified duration, initial encounter: Secondary | ICD-10-CM | POA: Diagnosis present

## 2015-05-24 DIAGNOSIS — J9601 Acute respiratory failure with hypoxia: Secondary | ICD-10-CM | POA: Diagnosis present

## 2015-05-24 DIAGNOSIS — L8990 Pressure ulcer of unspecified site, unspecified stage: Secondary | ICD-10-CM | POA: Diagnosis present

## 2015-05-24 LAB — COMPREHENSIVE METABOLIC PANEL
ALK PHOS: 72 U/L (ref 38–126)
ALT: 24 U/L (ref 17–63)
AST: 20 U/L (ref 15–41)
Albumin: 4 g/dL (ref 3.5–5.0)
Anion gap: 7 (ref 5–15)
BILIRUBIN TOTAL: 0.5 mg/dL (ref 0.3–1.2)
BUN: 14 mg/dL (ref 6–20)
CO2: 31 mmol/L (ref 22–32)
Calcium: 9.7 mg/dL (ref 8.9–10.3)
Chloride: 104 mmol/L (ref 101–111)
Creatinine, Ser: 0.54 mg/dL — ABNORMAL LOW (ref 0.61–1.24)
GFR calc Af Amer: >60 mL/min (ref >60)
GFR calc non Af Amer: >60 mL/min (ref >60)
Glucose, Bld: 88 mg/dL (ref 65–99)
POTASSIUM: 4.3 mmol/L (ref 3.5–5.1)
Sodium: 142 mmol/L (ref 135–145)
TOTAL PROTEIN: 7.8 g/dL (ref 6.5–8.1)

## 2015-05-24 LAB — CBC WITH DIFFERENTIAL/PLATELET
Basophils Absolute: 0 10*3/uL (ref 0–0.1)
Basophils Relative: 1 %
EOS PCT: 1 %
Eosinophils Absolute: 0.1 10*3/uL (ref 0–0.7)
HCT: 43.2 % (ref 40.0–52.0)
Hemoglobin: 13.9 g/dL (ref 13.0–18.0)
Lymphocytes Relative: 26 %
Lymphs Abs: 1.6 10*3/uL (ref 1.0–3.6)
MCH: 26.3 pg (ref 26.0–34.0)
MCHC: 32.1 g/dL (ref 32.0–36.0)
MCV: 81.9 fL (ref 80.0–100.0)
MONO ABS: 0.7 10*3/uL (ref 0.2–1.0)
MONOS PCT: 10 %
NEUTROS ABS: 4 10*3/uL (ref 1.4–6.5)
Neutrophils Relative %: 62 %
Platelets: 232 10*3/uL (ref 150–440)
RBC: 5.28 MIL/uL (ref 4.40–5.90)
RDW: 14.5 % (ref 11.5–14.5)
WBC: 6.4 10*3/uL (ref 3.8–10.6)

## 2015-05-24 LAB — TROPONIN I: Troponin I: <0.03 ng/mL (ref <0.031)

## 2015-05-24 NOTE — ED Provider Notes (Signed)
[facility]  Time seen: 10:57 PM  I have reviewed the triage vital signs and the nursing notes.   HISTORY  Chief Complaint Cough    HPI [name] [name] is a 29 y.o. male with a past medical history of hepatitis, rheumatic brain injury 3 years ago, now quadriplegic, presents the emergency department with 3 weeks of low-grade fevers, cough, progressive weakness. Mom states patient desat into the mid 35s for over an hour today at home so they called EMS and brought the patient to the emergency department. Mom states 2 weeks ago the patient was diagnosed with Clostridium difficile by his primary care doctor placed on Flagyl, and has now been placed on vancomycin although they have not started that medication yet. Patient state the patient only has one bowel movement per day. Has had low-grade fevers to 100.5. Mom states the patient has been coughing, with low oxygen saturations at home. History of PE in the past currently has an IVC filter, but has had PEs even with his IVC filter.Patient cannot adequately communicate since his traumatic brain injury. History is provided by mom.    Past Medical History  Diagnosis Date  . Hepatitis C   . Scoliosis   . TBI (traumatic brain injury) (HCC) 07/2011  . Subarachnoid hemorrhage (HCC)   . Pulmonary emboli (HCC)   . Hepatitis C   . Depression     Patient Active Problem List   Diagnosis Date Noted  . Convulsions/seizures (HCC) 02/10/2015  . Aspiration pneumonia (HCC) 02/07/2015  . Encephalopathy, metabolic 02/07/2015  . Acute respiratory failure (HCC) 01/29/2015  . Gastrostomy malfunction (HCC) 11/09/2014  . Skin irritation 11/09/2014  . TBI (traumatic brain injury) (HCC) 02/04/2012  . 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 Fracture surgery      left acetabulum  . Ileostomy    . Lung tube     . Ankle surgery Bilateral   . Tracheostomy    . Peg tube placement    . Janeway gastrostomy  07/02/2014    Current Outpatient Rx  Name  Route  Sig  Dispense  Refill  . albuterol (PROVENTIL) (2.5 MG/3ML) 0.083% nebulizer solution   Inhalation   Inhale 3 mLs into the lungs 2 (two) times daily as needed.         Marland Kitchen apixaban (ELIQUIS) 5 MG TABS tablet   Oral   Take 5 mg by mouth 2 (two) times daily.         . baclofen (LIORESAL) 20 MG tablet   Oral   Take 20 mg by mouth 3 (three) times daily.         . baclofen (LIORESAL) 20 MG tablet      TAKE 1 TABLET (20 MG) BY MOUTH 3 TIMES DAILY.         . clindamycin (CLEOCIN) 150 MG capsule   Oral   Take 3 capsules (450 mg total) by mouth 3 (three) times daily.   90 capsule   0   . Cranberry 1000 MG CAPS   Oral   Take 1,000 mg by mouth daily as needed.          . docusate sodium (COLACE) 100 MG capsule   Oral   Take 100 mg by mouth 2 (two) times daily.          Marland Kitchen gabapentin (NEURONTIN) 300 MG capsule   Oral  Take 600 mg by mouth 3 (three) times daily.          . indomethacin (INDOCIN SR) 75 MG CR capsule   Oral   Take 75 mg by mouth daily.         Marland Kitchen lacosamide (VIMPAT) 200 MG TABS tablet   Oral   Take 200 mg by mouth 2 (two) times daily.         . Multiple Vitamins-Minerals (MULTIVITAMIN WITH MINERALS) tablet   Oral   Take 1 tablet by mouth daily.         . Nutritional Supplements (FEEDING SUPPLEMENT, JEVITY 1.5 CAL/FIBER,) LIQD   Per Tube   Place 237 mLs into feeding tube 5 (five) times daily.         Marland Kitchen nystatin (MYCOSTATIN/NYSTOP) 100000 UNIT/GM POWD   Topical   Apply topically.         Marland Kitchen OLANZapine (ZYPREXA) 15 MG tablet   Oral   Take 15 mg by mouth at bedtime.         . pantoprazole (PROTONIX) 40 MG tablet   Oral   Take 40 mg by mouth 2 (two) times daily.          . propranolol ER (INDERAL LA) 60 MG 24 hr capsule   Oral   Take 60 mg by mouth daily.         . risedronate  (ACTONEL) 35 MG tablet   Oral   Take 35 mg by mouth every 7 (seven) days. with water on empty stomach, nothing by mouth or lie down for next 30 minutes. Take on Saturday.         . sulfamethoxazole-trimethoprim (BACTRIM DS,SEPTRA DS) 800-160 MG per tablet            0   . Water For Irrigation, Sterile (FREE WATER) SOLN   Per Tube   Place 300 mLs into feeding tube every 6 (six) hours.   1000 mL   11     Allergies Ambien; Ativan; Depakote er; Dilaudid; and Keppra  Family History  Problem Relation Age of Onset  . Hypertension      Social History Social History  Substance Use Topics  . Smoking status: Former Smoker    Quit date: 08/07/2011  . Smokeless tobacco: Never Used  . Alcohol Use: No    Review of Systems Constitutional: Fever to 100.5 per mom intermittently 3 weeks. Respiratory: Positive cough and low oxygen saturation per mom Gastrointestinal: Denies vomiting. Genitourinary: Condom catheter, positive for dark urine. Review of systems provided by mom. Patient cannot provide review of systems due to traumatic brain injury.  ____________________________________________   PHYSICAL EXAM:  VITAL SIGNS: ED Triage Vitals  Enc Vitals Group     BP --      Pulse Rate 05/24/15 2100 76     Resp 05/24/15 2100 16     Temp 05/24/15 2100 98.3 F (36.8 C)     Temp Source 05/24/15 2100 Oral     SpO2 05/24/15 2100 96 %     Weight 05/24/15 2100 200 lb (90.719 kg)     Height 05/24/15 2100 6' (1.829 m)     Head Cir --      Peak Flow --      Pain Score 05/24/15 2100 0     Pain Loc --      Pain Edu? --      Excl. in GC? --    Constitutional: Alert, awake, cannot provide history or follow  commands due to traumatic brain injury. Mom states he has been more somnolent recently, but states currently he is at his baseline. Eyes: Dilated left pupil, baseline ENT   Mouth/Throat: Mucous membranes are moist. Cardiovascular: Normal rate, regular rhythm. No  murmur Respiratory: Normal respiratory effort without tachypnea nor retractions. Breath sounds are clear Gastrointestinal: Soft and nontender. No distention.   Musculoskeletal: 2+ lower extremity edema bilaterally, unchanged per parents. Neurologic:  At baseline per parents. Mostly quadriplegic besides some movement of his right upper extremity. Neurologically at baseline Skin:  Skin is warm, dry and intact.  ____________________________________________   RADIOLOGY  Chest x-ray within normal limits  ____________________________________________   INITIAL IMPRESSION / ASSESSMENT AND PLAN / ED COURSE  Pertinent labs & imaging results that were available during my care of the patient were reviewed by me and considered in my medical decision making (see chart for details).  Patient presents with 3 weeks of intermittent low-grade fevers, cough, recent C. difficile diagnosis, status post Augmentin, Flagyl, and now vancomycin by primary care physician.  We will check a chest x-ray, labs, blood cultures.  Chest x-rays read as negative. Given oxygen desaturations at home and a history of PE with IVC, and PE status post IVC, we'll proceed with a CT a of this chest, as long as his kidney function can tolerate. Labs are pending. Urinalysis pending. Mom states a recent diagnosis of C. difficile, but states one formed bowel movement per day.  Urinalysis, CT at bedtime pending. Patient care signed out to Dr. Manson PasseyBrown.  ____________________________________________   FINAL CLINICAL IMPRESSION(S) / ED DIAGNOSES  Fever Cough congestion    Minna AntisKevin Ashey Tramontana, MD 05/24/15 2357

## 2015-05-24 NOTE — ED Notes (Signed)
Pt arrives to ED via [facility] d/t c/o low O2 sats at home and non-productive cough. Per EMS, pt's mother reports several recent episodes of ABX treatments, was noted to have O2 sat of 91% on 4L, and the pt is quadriplegic. Pt arrives in NAD, respirations even, regular and unlabored. Mother at bedside.

## 2015-05-24 NOTE — ED Notes (Signed)
U/S IV start to left a/c with 20GA cath; excellent blood return initially then stopped; IV d/c'd, cannula intact, dsng applied; will send blood tubes to lab and wait for further notified if additional blood needed

## 2015-05-24 NOTE — ED Notes (Signed)
Attempted 2nd U/S IV to left upper arm with 20GA cath; excellent blood return initially then stopped; IV d/c, cannula intact, dsng applied; Dr Lenard LancePaduchowski to bedside for attempt

## 2015-05-25 ENCOUNTER — Emergency Department: Payer: BLUE CROSS/BLUE SHIELD

## 2015-05-25 DIAGNOSIS — Z87820 Personal history of traumatic brain injury: Secondary | ICD-10-CM | POA: Diagnosis not present

## 2015-05-25 DIAGNOSIS — X58XXXA Exposure to other specified factors, initial encounter: Secondary | ICD-10-CM | POA: Diagnosis present

## 2015-05-25 DIAGNOSIS — B1920 Unspecified viral hepatitis C without hepatic coma: Secondary | ICD-10-CM | POA: Diagnosis present

## 2015-05-25 DIAGNOSIS — R0902 Hypoxemia: Secondary | ICD-10-CM | POA: Diagnosis present

## 2015-05-25 DIAGNOSIS — Z888 Allergy status to other drugs, medicaments and biological substances status: Secondary | ICD-10-CM | POA: Diagnosis not present

## 2015-05-25 DIAGNOSIS — J9601 Acute respiratory failure with hypoxia: Secondary | ICD-10-CM | POA: Diagnosis present

## 2015-05-25 DIAGNOSIS — A498 Other bacterial infections of unspecified site: Secondary | ICD-10-CM | POA: Diagnosis present

## 2015-05-25 DIAGNOSIS — G8250 Quadriplegia, unspecified: Secondary | ICD-10-CM | POA: Diagnosis present

## 2015-05-25 DIAGNOSIS — T17590A Other foreign object in bronchus causing asphyxiation, initial encounter: Secondary | ICD-10-CM | POA: Diagnosis present

## 2015-05-25 DIAGNOSIS — Z9989 Dependence on other enabling machines and devices: Secondary | ICD-10-CM

## 2015-05-25 DIAGNOSIS — G4733 Obstructive sleep apnea (adult) (pediatric): Secondary | ICD-10-CM | POA: Diagnosis present

## 2015-05-25 DIAGNOSIS — A047 Enterocolitis due to Clostridium difficile: Secondary | ICD-10-CM | POA: Diagnosis present

## 2015-05-25 DIAGNOSIS — Z86711 Personal history of pulmonary embolism: Secondary | ICD-10-CM | POA: Diagnosis present

## 2015-05-25 DIAGNOSIS — K5900 Constipation, unspecified: Secondary | ICD-10-CM | POA: Diagnosis present

## 2015-05-25 DIAGNOSIS — L89623 Pressure ulcer of left heel, stage 3: Secondary | ICD-10-CM | POA: Diagnosis present

## 2015-05-25 DIAGNOSIS — R1310 Dysphagia, unspecified: Secondary | ICD-10-CM | POA: Diagnosis present

## 2015-05-25 DIAGNOSIS — T17500A Unspecified foreign body in bronchus causing asphyxiation, initial encounter: Secondary | ICD-10-CM | POA: Diagnosis present

## 2015-05-25 DIAGNOSIS — J9809 Other diseases of bronchus, not elsewhere classified: Secondary | ICD-10-CM | POA: Diagnosis present

## 2015-05-25 DIAGNOSIS — R509 Fever, unspecified: Secondary | ICD-10-CM | POA: Diagnosis present

## 2015-05-25 DIAGNOSIS — L8990 Pressure ulcer of unspecified site, unspecified stage: Secondary | ICD-10-CM | POA: Diagnosis present

## 2015-05-25 LAB — URINALYSIS COMPLETE WITH MICROSCOPIC (ARMC ONLY)
BACTERIA UA: NONE SEEN
Bilirubin Urine: NEGATIVE
Bilirubin Urine: NEGATIVE
Glucose, UA: NEGATIVE mg/dL
Glucose, UA: NEGATIVE mg/dL
HGB URINE DIPSTICK: NEGATIVE
HGB URINE DIPSTICK: NEGATIVE
Ketones, ur: NEGATIVE mg/dL
LEUKOCYTES UA: NEGATIVE
Leukocytes, UA: NEGATIVE
Nitrite: POSITIVE — AB
Nitrite: NEGATIVE
PH: 7 (ref 5.0–8.0)
PH: 8 (ref 5.0–8.0)
Protein, ur: NEGATIVE mg/dL
Protein, ur: NEGATIVE mg/dL
RBC / HPF: NONE SEEN RBC/hpf (ref 0–5)
RBC / HPF: NONE SEEN RBC/hpf (ref 0–5)
SPECIFIC GRAVITY, URINE: 1.012 (ref 1.005–1.030)
SPECIFIC GRAVITY, URINE: 1.018 (ref 1.005–1.030)

## 2015-05-25 LAB — EXPECTORATED SPUTUM ASSESSMENT W REFEX TO RESP CULTURE: SPECIAL REQUESTS: NORMAL

## 2015-05-25 LAB — BASIC METABOLIC PANEL
Anion gap: 8 (ref 5–15)
BUN: 16 mg/dL (ref 6–20)
CALCIUM: 9.3 mg/dL (ref 8.9–10.3)
CO2: 29 mmol/L (ref 22–32)
CREATININE: 0.49 mg/dL — AB (ref 0.61–1.24)
Chloride: 103 mmol/L (ref 101–111)
GFR calc non Af Amer: >60 mL/min (ref >60)
Glucose, Bld: 93 mg/dL (ref 65–99)
Potassium: 4.5 mmol/L (ref 3.5–5.1)
SODIUM: 140 mmol/L (ref 135–145)

## 2015-05-25 LAB — CBC
HEMATOCRIT: 39.5 % — AB (ref 40.0–52.0)
Hemoglobin: 13.2 g/dL (ref 13.0–18.0)
MCH: 27.4 pg (ref 26.0–34.0)
MCHC: 33.4 g/dL (ref 32.0–36.0)
MCV: 82 fL (ref 80.0–100.0)
Platelets: 232 10*3/uL (ref 150–440)
RBC: 4.81 MIL/uL (ref 4.40–5.90)
RDW: 15.2 % — AB (ref 11.5–14.5)
WBC: 6.5 10*3/uL (ref 3.8–10.6)

## 2015-05-25 LAB — GLUCOSE, CAPILLARY: Glucose-Capillary: 101 mg/dL — ABNORMAL HIGH (ref 65–99)

## 2015-05-25 LAB — EXPECTORATED SPUTUM ASSESSMENT W GRAM STAIN, RFLX TO RESP C

## 2015-05-25 LAB — MRSA PCR SCREENING: MRSA BY PCR: NEGATIVE

## 2015-05-25 MED ORDER — ONDANSETRON HCL 4 MG/2ML IJ SOLN: 4.0000 mg | Freq: Four times a day (QID) | INTRAMUSCULAR | Status: DC | PRN

## 2015-05-25 MED ORDER — PERPHENAZINE 4 MG PO TABS
4.0000 mg | ORAL_TABLET | Freq: Two times a day (BID) | ORAL | Status: DC
  Administered 2015-05-25 – 2015-05-29 (×9): 4 mg via ORAL
  Filled 2015-05-25 (×10): qty 1

## 2015-05-25 MED ORDER — SODIUM CHLORIDE 0.9 % IV SOLN
INTRAVENOUS | Status: DC
  Administered 2015-05-25 – 2015-05-28 (×6): via INTRAVENOUS

## 2015-05-25 MED ORDER — ALBUTEROL SULFATE (2.5 MG/3ML) 0.083% IN NEBU: 3.0000 mL | INHALATION_SOLUTION | Freq: Four times a day (QID) | RESPIRATORY_TRACT | Status: DC | PRN

## 2015-05-25 MED ORDER — DEXTROSE 5 % IV SOLN
2.0000 g | INTRAVENOUS | Status: DC
  Filled 2015-05-25: qty 2

## 2015-05-25 MED ORDER — ACETAMINOPHEN 650 MG RE SUPP: 650.0000 mg | Freq: Four times a day (QID) | RECTAL | Status: DC | PRN

## 2015-05-25 MED ORDER — APIXABAN 5 MG PO TABS
5.0000 mg | ORAL_TABLET | Freq: Two times a day (BID) | ORAL | Status: DC
  Administered 2015-05-25 (×2): 5 mg via ORAL
  Filled 2015-05-25 (×2): qty 1

## 2015-05-25 MED ORDER — METRONIDAZOLE 500 MG PO TABS
500.0000 mg | ORAL_TABLET | Freq: Three times a day (TID) | ORAL | Status: DC
  Administered 2015-05-25 – 2015-05-29 (×13): 500 mg via ORAL
  Filled 2015-05-25 (×13): qty 1

## 2015-05-25 MED ORDER — JEVITY 1.5 CAL/FIBER PO LIQD
237.0000 mL | Freq: Every day | ORAL | Status: DC
  Administered 2015-05-25 – 2015-05-29 (×15): 237 mL

## 2015-05-25 MED ORDER — ONDANSETRON HCL 4 MG PO TABS
4.0000 mg | ORAL_TABLET | Freq: Four times a day (QID) | ORAL | Status: DC | PRN
Start: 2015-05-25 — End: 2015-05-29

## 2015-05-25 MED ORDER — INFLUENZA VAC SPLIT QUAD 0.5 ML IM SUSY
0.5000 mL | PREFILLED_SYRINGE | INTRAMUSCULAR | Status: AC
  Administered 2015-05-28: 0.5 mL via INTRAMUSCULAR
  Filled 2015-05-25: qty 0.5

## 2015-05-25 MED ORDER — SODIUM CHLORIDE 0.9 % IJ SOLN
3.0000 mL | Freq: Two times a day (BID) | INTRAMUSCULAR | Status: DC
  Administered 2015-05-27 – 2015-05-28 (×3): 3 mL via INTRAVENOUS

## 2015-05-25 MED ORDER — VANCOMYCIN 50 MG/ML ORAL SOLUTION
250.0000 mg | Freq: Four times a day (QID) | ORAL | Status: DC
  Administered 2015-05-25 – 2015-05-29 (×17): 250 mg via ORAL
  Filled 2015-05-25 (×21): qty 5

## 2015-05-25 MED ORDER — FREE WATER
300.0000 mL | Freq: Four times a day (QID) | Status: DC
  Administered 2015-05-25 – 2015-05-29 (×17): 300 mL

## 2015-05-25 MED ORDER — OLANZAPINE 7.5 MG PO TABS
15.0000 mg | ORAL_TABLET | Freq: Every day | ORAL | Status: DC
  Administered 2015-05-25 – 2015-05-28 (×4): 15 mg via ORAL
  Filled 2015-05-25 (×6): qty 2

## 2015-05-25 MED ORDER — PROPRANOLOL HCL ER 60 MG PO CP24
60.0000 mg | ORAL_CAPSULE | Freq: Every day | ORAL | Status: DC
Start: 2015-05-25 — End: 2015-05-25
  Filled 2015-05-25: qty 1

## 2015-05-25 MED ORDER — PROPRANOLOL HCL 20 MG PO TABS
20.0000 mg | ORAL_TABLET | Freq: Two times a day (BID) | ORAL | Status: DC
  Filled 2015-05-25 (×4): qty 1

## 2015-05-25 MED ORDER — IOHEXOL 350 MG/ML SOLN
100.0000 mL | Freq: Once | INTRAVENOUS | Status: AC | PRN
  Administered 2015-05-25: 100 mL via INTRAVENOUS

## 2015-05-25 MED ORDER — SODIUM CHLORIDE 0.9 % IV SOLN
INTRAVENOUS | Status: DC
  Administered 2015-05-25: 05:00:00 via INTRAVENOUS

## 2015-05-25 MED ORDER — LACOSAMIDE 200 MG PO TABS
200.0000 mg | ORAL_TABLET | Freq: Two times a day (BID) | ORAL | Status: DC
  Administered 2015-05-25 – 2015-05-29 (×9): 200 mg via ORAL
  Filled 2015-05-25: qty 4
  Filled 2015-05-25 (×3): qty 1
  Filled 2015-05-25 (×4): qty 4
  Filled 2015-05-25: qty 1

## 2015-05-25 MED ORDER — ACETAMINOPHEN 325 MG PO TABS
650.0000 mg | ORAL_TABLET | Freq: Four times a day (QID) | ORAL | Status: DC | PRN
  Administered 2015-05-25 – 2015-05-26 (×2): 650 mg via ORAL
  Filled 2015-05-25 (×2): qty 2

## 2015-05-25 MED ORDER — RISEDRONATE SODIUM 5 MG PO TABS: 35.0000 mg | ORAL_TABLET | ORAL | Status: DC

## 2015-05-25 MED ORDER — GABAPENTIN 300 MG PO CAPS
900.0000 mg | ORAL_CAPSULE | Freq: Three times a day (TID) | ORAL | Status: DC
  Administered 2015-05-25 – 2015-05-29 (×13): 900 mg via ORAL
  Filled 2015-05-25 (×13): qty 3

## 2015-05-25 NOTE — Progress Notes (Signed)
Initial Nutrition Assessment     INTERVENTION:  Meals and snacks: Cater to pt preferences within diet restrictions Medical Nutrition Supplement Therapy: pt and mother asking for magic cup and will send TID EN: Continue jevity 1.5 5 cans per day at this time to provide 1777 kcals, 118 gm of protein, free water. Additional free water flush of 300 q 6 hr (4 times per day) 1237ml/d  NUTRITION DIAGNOSIS:   Increased nutrient needs related to acute illness as evidenced by estimated needs.    GOAL:   Patient will meet greater than or equal to 90% of their needs     MONITOR:    (Energy intake, Electrolyte and renal profile, Anthropoemetrics)  REASON FOR ASSESSMENT:    (enteral nutrition )    ASSESSMENT:      Pt admitted with hypoxemia, fevers, c-diff positive, mucus plugging  Past Medical History  Diagnosis Date  . Hepatitis C   . Scoliosis   . TBI (traumatic brain injury) (HCC) 07/2011  . Subarachnoid hemorrhage (HCC)   . Pulmonary emboli (HCC)   . Hepatitis C   . Depression     Current Nutrition: has not taken any food today per mother  Food/Nutrition-Related History: Mother reports intake for the last 3 weeks has been decreased secondary to fevers and not feeling well  Mother reports has been giving jevity 1.5 when not eating well for nutrition via PEG tube. Mother feels that pt is doing well on jevity 1.5 feeding when not able to eat. No tube feeding given today per RN, Brandi secondary to pt NPO ??   Scheduled Medications:  . apixaban  5 mg Oral BID  . feeding supplement (JEVITY 1.5 CAL/FIBER)  237 mL Per Tube 5 X Daily  . free water  300 mL Per Tube 4 times per day  . gabapentin  900 mg Oral TID  . [START ON 05/26/2015] Influenza vac split quadrivalent PF  0.5 mL Intramuscular Tomorrow-1000  . lacosamide  200 mg Oral BID  . metroNIDAZOLE  500 mg Oral TID  . OLANZapine  15 mg Oral QHS  . perphenazine  4 mg Oral BID  . propranolol  20 mg Oral BID  .  sodium chloride  3 mL Intravenous Q12H  . vancomycin  250 mg Oral 4 times per day    Continuous Medications:  . sodium chloride 75 mL/hr at 05/25/15 0516     Electrolyte/Renal Profile and Glucose Profile:   Recent Labs Lab 05/24/15 2300 05/25/15 0644  NA 142 140  K 4.3 4.5  CL 104 103  CO2 31 29  BUN 14 16  CREATININE 0.54* 0.49*  CALCIUM 9.7 9.3  GLUCOSE 88 93   Protein Profile:  Recent Labs Lab 05/24/15 2300  ALBUMIN 4.0    Gastrointestinal Profile: Last BM: PTA   Nutrition-Focused Physical Exam Findings:  Unable to complete Nutrition-Focused physical exam at this time.     Weight Change: noted wt gain per wt encounters    Diet Order:  DIET DYS 2 Room service appropriate?: Yes; Fluid consistency:: Honey Thick  Skin:   pressure ulcer stage II on ankle noted   Height:   Ht Readings from Last 1 Encounters:  05/24/15 6' (1.829 m)    Weight:   Wt Readings from Last 1 Encounters:  05/24/15 200 lb (90.719 kg)     BMI:  Body mass index is 27.12 kg/(m^2).  Estimated Nutritional Needs:   Kcal:  BEE 1823 kcal (IF 1.0-1.2, AF1.2) 2187-2625 kcals/d  Using IBW of 81kg  Protein:  (1.2-1.5 g/d) 97-122 g/d (Using IBW of 81kg)  Fluid:  (30-8135ml/kg) 2430-287935ml/d  EDUCATION NEEDS:   No education needs identified at this time  MODERATE Care Level  Marisa Hufstetler B. Freida BusmanAllen, RD, LDN [phone] (pager)

## 2015-05-25 NOTE — Progress Notes (Signed)
[name] [name] is a 29 y.o. male   SUBJECTIVE:  Patient admitted with progressive hypoxemia and fevers. Was diagnosed with C. difficile several weeks ago and treated with by mouth Flagyl however C. difficile toxin is repeat positive and patient switched to oral vancomycin. Patient presented to the ED with dyspnea and fevers. Chest CT shows mucous plugging, no pulmonary emboli.  ______________________________________________________________________  ROS: Review of systems is unremarkable for any active cardiac,respiratory, GI, GU, hematologic, neurologic or psychiatric systems, 10 systems reviewed.  Marland Kitchen. apixaban  5 mg Oral BID  . feeding supplement (JEVITY 1.5 CAL/FIBER)  237 mL Per Tube 5 X Daily  . free water  300 mL Per Tube 4 times per day  . gabapentin  900 mg Oral TID  . lacosamide  200 mg Oral BID  . metroNIDAZOLE  500 mg Oral TID  . OLANZapine  15 mg Oral QHS  . perphenazine  4 mg Oral BID  . propranolol ER  60 mg Oral Daily  . sodium chloride  3 mL Intravenous Q12H  . vancomycin  250 mg Oral 4 times per day   acetaminophen **OR** acetaminophen, albuterol, ondansetron **OR** ondansetron (ZOFRAN) IV   Past Medical History  Diagnosis Date  . Hepatitis C   . Scoliosis   . TBI (traumatic brain injury) (HCC) 07/2011  . Subarachnoid hemorrhage (HCC)   . Pulmonary emboli (HCC)   . Hepatitis C   . Depression     Past Surgical History  Procedure Laterality Date  . Fracture surgery      left acetabulum  . Ileostomy    . Lung tube    . Ankle surgery Bilateral   . Tracheostomy    . Peg tube placement    . Janeway gastrostomy  07/02/2014    PHYSICAL EXAM:  BP 96/49 mmHg  Pulse 79  Temp(Src) 99 F (37.2 C) (Axillary)  Resp 18  Ht 6' (1.829 m)  Wt 90.719 kg (200 lb)  BMI 27.12 kg/m2  SpO2 96%  Wt Readings from Last 3 Encounters:  05/24/15 90.719 kg (200 lb)  04/12/15 88.451 kg (195 lb)  02/14/15 88.315 kg (194 lb 11.2 oz)           BP Readings from Last 3  Encounters:  05/25/15 96/49  04/12/15 104/60  04/08/15 120/66    Constitutional: NAD Neck: supple, no thyromegaly Respiratory: Rhonchi throughout Cardiovascular: RRR, no murmur, no gallop Abdomen: soft, good BS, nontender Extremities: no edema Neuro: alert and oriented, no focal motor or sensory deficits  ASSESSMENT/PLAN:  Labs and imaging studies were reviewed  Mucous plugging with hypoxemia- pulm consult for consideration of bronch to remove mucous plug, pulmonary consult, chest physiotherapy Recurrent C. difficile-oral Flagyl and vancomycin History of pulmonary embolism-Eliquis Traumatic brain injury-stable

## 2015-05-25 NOTE — Progress Notes (Signed)
RT to room to perform CPT and suction for sputum sample.  Patient has just finished eating per sitter at bedside.  Will return in one hour to perform CPT and suction.

## 2015-05-25 NOTE — Progress Notes (Signed)
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 Chest ct noted his pneumonia is resolved but " large mucus plugging on the left." Reactive nodes still present. He known to be an  [employer]line [employer].    PMHX:   Past Medical History  Diagnosis Date  . Hepatitis C   . Scoliosis   . TBI (traumatic brain injury) (HCC) 07/2011  . Subarachnoid hemorrhage (HCC)   . Pulmonary emboli (HCC)   . Hepatitis C   . Depression    Surgical Hx:  Past Surgical History  Procedure Laterality Date  . Fracture surgery      left acetabulum  . Ileostomy    . Lung tube    . Ankle surgery Bilateral   . Tracheostomy    . Peg tube placement    . [place] gastrostomy  07/02/2014   Family Hx:  Family History  Problem Relation Age of Onset  . Hypertension    . Hyperlipidemia Father   . Autoimmune disease Mother   . Cancer Maternal Grandmother   . Heart disease Maternal Grandfather    Social Hx:   Social History  Substance Use Topics  . Smoking status: Former Smoker    Quit date: 08/07/2011  . Smokeless tobacco: Never Used  . Alcohol Use: No   Medication:    Home Medication:  No current outpatient prescriptions on file.  Current Medication: @   Allergies:  Ambien; Ativan; Depakote er; Dilaudid; and Keppra  Review of Systems: Unable to obtain  Physical Examination:   VS: BP 104/59 mmHg  Pulse 81  Temp(Src) 97.4 F (36.3 C) (Oral)  Resp 16  Ht 6' (1.829 m)  Wt 200 lb (90.719 kg)  BMI 27.12 kg/m2  SpO2 94%  General Appearance: No distress  Neuro: without focal findings, mental status, speech normal, alert and oriented, cranial nerves 2-12 intact, reflexes normal and symmetric, sensation  grossly normal  HEENT: PERRLA, EOM intact, no ptosis, no other lesions noticed Neck: No stridor Pulmonary:.No wheezing, No rales  Decrease bs at the base:   Cardiovascular:  Normal S1,S2.  No m/r/g.   Abdomen:Benign, Soft, non-tender, No masses, hepatosplenomegaly, No lymphadenopathy Endoc: No evident thyromegaly, no signs of acromegaly or Cushing features Skin:   warm, no rashes, no ecchymosis  Extremities: normal, no cyanosis, clubbing, no edema, warm with normal capillary refill.   Labs results:   Recent Labs     05/24/15  2300  05/25/15  0644  HGB  13.9  13.2  HCT  43.2  39.5*  MCV  81.9  82.0  WBC  6.4  6.5  BUN  14  16  CREATININE  0.54*  0.49*  GLUCOSE  88  93  CALCIUM  9.7  9.3  ,   Rad results:   Ct Angio Chest Pe W/cm &/or Wo Cm  05/25/2015  CLINICAL DATA:  29 year old male with low oxygen saturation and nonproductive cough. Quadriplegic patient. Labored breathing. EXAM: CT ANGIOGRAPHY CHEST WITH CONTRAST TECHNIQUE: Multidetector CT imaging of the chest was performed using the standard protocol during bolus administration of intravenous contrast. Multiplanar CT  image reconstructions and MIPs were obtained to evaluate the vascular anatomy. CONTRAST:  100mL OMNIPAQUE IOHEXOL 350 MG/ML SOLN COMPARISON:  Chest CT 01/29/2015. FINDINGS: Mediastinum/Lymph Nodes: No filling defects within the pulmonary arterial tree to suggest underlying pulmonary embolism. Heart size is normal. There is no significant pericardial fluid, thickening or pericardial calcification. Multiple prominent borderline enlarged and mildly enlarged mediastinal and hilar lymph nodes appear similar to the prior examination, measuring up to 1 cm in short axis in the subcarinal and left hilar nodal stations. Esophagus is unremarkable in appearance. No axillary lymphadenopathy. Lungs/Pleura: Previously noted areas of airspace consolidation have largely resolved, with minimal residual linear opacities in the lung bases  bilaterally, most compatible with areas of mild post infectious or inflammatory scarring. There is no new consolidative airspace disease on today's examination. No pleural effusions. No suspicious appearing pulmonary nodules or masses. There continues to be extensive dependent frothy secretions in the left mainstem bronchus extending into both left upper and lower lobe bronchi. Upper Abdomen: Unremarkable. Musculoskeletal/Soft Tissues: There are no aggressive appearing lytic or blastic lesions noted in the visualized portions of the skeleton. Review of the MIP images confirms the above findings. IMPRESSION: 1. No evidence of pulmonary embolism. 2. Resolution of previously noted multilobar pneumonia. 3. There continues to be a large amount of incompletely cleared secretions in the left-sided bronchi. 4. Reactive mediastinal and hilar lymphadenopathy, similar to the prior examination, as above Electronically Signed   By: Trudie Reedaniel  Entrikin M.D.   On: 05/25/2015 00:47   Dg Chest Portable 1 View  05/24/2015  CLINICAL DATA:  Low oxygen saturations and nonproductive cough. Patient is quadriplegic. EXAM: PORTABLE CHEST 1 VIEW COMPARISON:  04/08/2015 FINDINGS: Low lung volumes without focal lung disease. Heart size is normal. Negative for a pneumothorax. IMPRESSION: No acute chest findings. Electronically Signed   By: Richarda OverlieAdam  Henn M.D.   On: 05/24/2015 22:17     Assessment and Plan: Asked to see regarding left mucus plugging, in a patient at risk of aspiration, quadriplegic -may need nasal trump placement with suction vs bronch will avoid the later -vibratory vest would be ideal for him -dietary precautions: - Diet recommendations: Dysphagia 2 (fine chop);Honey-thick liquid Liquids provided via: Teaspoon;Cup;Straw Medication Administration: Crushed with puree Supervision: Full supervision/cueing for compensatory strategies;Trained caregiver to feed patient;Staff to assist with self feeding Compensations: Slow  rate;Small sips/bites;Follow solids with liquid Postural Changes and/or Swallow Maneuvers: Seated upright 90 degrees   I have personally obtained a history, examined the patient, evaluated laboratory and imaging results, formulated the assessment and plan and placed orders.  The Patient requires high complexity decision making for assessment and support, frequent evaluation and titration of therapies, application of advanced monitoring technologies and extensive interpretation of multiple databases.   Herbon Fleming,M.D. Pulmonary & Critical care Medicine [facility]

## 2015-05-25 NOTE — ED Provider Notes (Signed)
I assumed care of the patient from Dr Lenard LancePaduchowski at 11PM. CT scan of the chest revealed     CT Angio Chest PE W/Cm &/Or Wo Cm (Final result) Result time: 05/25/15 00:47:54   Final result by Rad Results In Interface (05/25/15 00:47:54)   Narrative:   CLINICAL DATA: 29 year old male with low oxygen saturation and nonproductive cough. Quadriplegic patient. Labored breathing.  EXAM: CT ANGIOGRAPHY CHEST WITH CONTRAST  TECHNIQUE: Multidetector CT imaging of the chest was performed using the standard protocol during bolus administration of intravenous contrast. Multiplanar CT image reconstructions and MIPs were obtained to evaluate the vascular anatomy.  CONTRAST: 100mL OMNIPAQUE IOHEXOL 350 MG/ML SOLN  COMPARISON: Chest CT 01/29/2015.  FINDINGS: Mediastinum/Lymph Nodes: No filling defects within the pulmonary arterial tree to suggest underlying pulmonary embolism. Heart size is normal. There is no significant pericardial fluid, thickening or pericardial calcification. Multiple prominent borderline enlarged and mildly enlarged mediastinal and hilar lymph nodes appear similar to the prior examination, measuring up to 1 cm in short axis in the subcarinal and left hilar nodal stations. Esophagus is unremarkable in appearance. No axillary lymphadenopathy.  Lungs/Pleura: Previously noted areas of airspace consolidation have largely resolved, with minimal residual linear opacities in the lung bases bilaterally, most compatible with areas of mild post infectious or inflammatory scarring. There is no new consolidative airspace disease on today's examination. No pleural effusions. No suspicious appearing pulmonary nodules or masses. There continues to be extensive dependent frothy secretions in the left mainstem bronchus extending into both left upper and lower lobe bronchi.  Upper Abdomen: Unremarkable.  Musculoskeletal/Soft Tissues: There are no aggressive appearing lytic or  blastic lesions noted in the visualized portions of the skeleton.  Review of the MIP images confirms the above findings.  IMPRESSION: 1. No evidence of pulmonary embolism. 2. Resolution of previously noted multilobar pneumonia. 3. There continues to be a large amount of incompletely cleared secretions in the left-sided bronchi. 4. Reactive mediastinal and hilar lymphadenopathy, similar to the prior examination, as above   Electronically Signed By: Trudie Reedaniel Entrikin M.D. On: 05/25/2015 00:47          DG Chest Portable 1 View (Final result) Result time: 05/24/15 22:17:54   Final result by Rad Results In Interface (05/24/15 22:17:54)   Narrative:   CLINICAL DATA: Low oxygen saturations and nonproductive cough. Patient is quadriplegic.  EXAM: PORTABLE CHEST 1 VIEW  COMPARISON: 04/08/2015  FINDINGS: Low lung volumes without focal lung disease. Heart size is normal. Negative for a pneumothorax.  IMPRESSION: No acute chest findings.   Electronically Signed By: Richarda OverlieAdam Henn M.D. On: 05/24/2015 22:17         I suspect the etiology patient's hypoxia secondary to mucous plugging. I discussed the patient with Dr. Anne HahnWillis for admission for possible bronchoscopy.  Darci Currentandolph N Brown, MD 05/25/15 [phone]

## 2015-05-25 NOTE — Care Management (Signed)
Patient presents from home with hypoxia.  Patient has history of TBI and is a quadriplegic. Patient resides at home with his mother, father, and older sister.  Patient receives 50 hours a week through [facility].  Patient's mother provides most of the care.  Patient obtains his medications through [facility].  Patient has a hospital bed, lift, Wc, shower chair, as well as additional medical equipment in the home.  Patient uses 2 liters of O2 at night.  O2 is supplied by [facility].  Mother would like to inquire about a feeding pump for the home.  I have called and spoke with Will from [facility].  A rx would need to me wrote for a eternal feeding pump, why it was needed, amount/frequency/duration of feedings and free water, and type of feedings.  Will discuss with MD.  RNCM to follow

## 2015-05-25 NOTE — Consult Note (Signed)
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  Present on admission. His mother has been treating this at home with antimicrobial ointment.  Wound type:Stage 3 pressure injury Pressure Ulcer POA: Yes Measurement: 3 cm x 2.3 cm x 0.1 cm  Wound bed:50% fibrin slough, adherent.  Mother states it was an intact blister. He had one on his right heel that has healed. He wears heel protectors at home.  Does not have them on today. Will treat conservatively with NS moist gauze dressings BID.  Drainage (amount, consistency, odor) minimal serous drainage.  Periwound:Intact Dressing procedure/placement/frequency:Cleanse left heel with NS and pat gently dry.  Apply NS moist gauze to wound bed. Cover with 4x4 gauze and kerlix/tape.  Change twice daily.  BIlateral Prevalon boots. Will not follow at this time.  Please re-consult if needed.  Maple HudsonKaren Shalom Ware RN BSN CWON Pager [phone]

## 2015-05-25 NOTE — Progress Notes (Signed)
Patient alert and oriented x1. Oriented to room, unit, and call bell. No complaints at this time. Will cont to assess. Skin assessment verified by Clydie BraunKaren, RN (wound care). Telemetry box verified. Patient admitted to unit at 0650, admission completed at this time.  Trudee KusterBrandi R Mansfield

## 2015-05-25 NOTE — H&P (Addendum)
[facility] [place] at [facility]   PATIENT NAME: [name] [name]    MR#:  [id number]  DATE OF BIRTH:  01-07-1986  DATE OF ADMISSION:  05/24/2015  PRIMARY CARE PHYSICIAN: [name]., MD   REQUESTING/REFERRING PHYSICIAN: Manson Passey, M.D.  CHIEF COMPLAINT:   Chief Complaint  Patient presents with  . Cough    HISTORY OF PRESENT ILLNESS:  Rachel Samples  is a 29 y.o. male who presents with hypoxia and persistent low-grade fever. Patient has TBI and is quadriplegic and much of the history is provided by his parents. They stated that for the last 3-4 weeks he has been struggling with multiple medical issues which have led to significantly increased weakness. Initially it is felt that he had a pneumonia, and was given a course of Augmentin outpatient. After being on the Augmentin he was subsequently diagnosed with C. difficile, and has been on Flagyl for treatment of the same. It is unclear the Flagyl has adequately treating his C. difficile. He then began coughing and having more chest congestion. Over the past day or so he started dropping his oxygen saturations into the low 80s. At this point his parents brought him in to be evaluated. CTA chest here showed likely mucous plugging. Hospitalists were called for admission  PAST MEDICAL HISTORY:   Past Medical History  Diagnosis Date  . Hepatitis C   . Scoliosis   . TBI (traumatic brain injury) (HCC) 07/2011  . Subarachnoid hemorrhage (HCC)   . Pulmonary emboli (HCC)   . Hepatitis C   . Depression     PAST SURGICAL HISTORY:   Past Surgical History  Procedure Laterality Date  . Fracture surgery      left acetabulum  . Ileostomy    . Lung tube    . Ankle surgery Bilateral   . Tracheostomy    . Peg tube placement    . [place] gastrostomy  07/02/2014    SOCIAL HISTORY:   Social History  Substance Use Topics  . Smoking status: Former Smoker    Quit date: 08/07/2011  . Smokeless tobacco: Never Used  .  Alcohol Use: No    FAMILY HISTORY:   Family History  Problem Relation Age of Onset  . Hypertension    . Hyperlipidemia Father   . Autoimmune disease Mother   . Cancer Maternal Grandmother   . Heart disease Maternal Grandfather     DRUG ALLERGIES:   Allergies  Allergen Reactions  . Ambien [Zolpidem] Other (See Comments)    Patient start hallucinating.   . Ativan [Lorazepam] Itching and Other (See Comments)    Patient gets hot and sweaty. Hallucinations   . Depakote Er [Divalproex Sodium Er] Itching and Other (See Comments)    Patient gets hot and sweaty.  . Dilaudid [Hydromorphone] Hives and Other (See Comments)    Patient gets hot and sweaty.  Marland Kitchen Keppra [Levetiracetam] Itching and Other (See Comments)    Patient gets hot and sweaty.    MEDICATIONS AT HOME:   Prior to Admission medications   Medication Sig Start Date End Date Taking? Authorizing Provider  albuterol (PROVENTIL) (2.5 MG/3ML) 0.083% nebulizer solution Inhale 3 mLs into the lungs 2 (two) times daily as needed. 11/30/12  Yes Historical Provider, MD  apixaban (ELIQUIS) 5 MG TABS tablet Take 5 mg by mouth 2 (two) times daily.   Yes Historical Provider, MD  Cranberry 1000 MG CAPS Take 1,000 mg by mouth daily.    Yes Historical Provider, MD  gabapentin (NEURONTIN) 300 MG capsule Take 900 mg by mouth 3 (three) times daily.    Yes Historical Provider, MD  indomethacin (INDOCIN SR) 75 MG CR capsule Take 75 mg by mouth daily.   Yes Historical Provider, MD  lacosamide (VIMPAT) 200 MG TABS tablet Take 200 mg by mouth 2 (two) times daily.   Yes Historical Provider, MD  metroNIDAZOLE (FLAGYL) 500 MG tablet Take 500 mg by mouth 3 (three) times daily.   Yes Historical Provider, MD  Multiple Vitamins-Minerals (MULTIVITAMIN WITH MINERALS) tablet Take 1 tablet by mouth daily.   Yes Historical Provider, MD  Nutritional Supplements (FEEDING SUPPLEMENT, JEVITY 1.5 CAL/FIBER,) LIQD Place 237 mLs into feeding tube 5 (five) times daily.  02/13/15  Yes Danella PentonMark F Miller, MD  nystatin (MYCOSTATIN/NYSTOP) 100000 UNIT/GM POWD Apply 1 g topically 2 (two) times daily.  12/05/14  Yes Historical Provider, MD  OLANZapine (ZYPREXA) 15 MG tablet Take 15 mg by mouth at bedtime.   Yes Historical Provider, MD  perphenazine (TRILAFON) 4 MG tablet Take 4 mg by mouth 2 (two) times daily.   Yes Historical Provider, MD  propranolol ER (INDERAL LA) 60 MG 24 hr capsule Take 60 mg by mouth daily.   Yes Historical Provider, MD  risedronate (ACTONEL) 35 MG tablet Take 35 mg by mouth every 7 (seven) days. with water on empty stomach, nothing by mouth or lie down for next 30 minutes. Take on Saturday.   Yes Historical Provider, MD  Water For Irrigation, Sterile (FREE WATER) SOLN Place 300 mLs into feeding tube every 6 (six) hours. 02/05/15  Yes Danella PentonMark F Miller, MD    REVIEW OF SYSTEMS:  Review of Systems  Constitutional: Positive for fever (low-grade). Negative for chills, weight loss and malaise/fatigue.  HENT: Negative for ear pain, hearing loss and tinnitus.   Eyes: Negative for blurred vision, double vision, pain and redness.  Respiratory: Negative for cough, hemoptysis and shortness of breath.   Cardiovascular: Negative for chest pain, palpitations, orthopnea and leg swelling.  Gastrointestinal: Positive for diarrhea. Negative for nausea, vomiting, abdominal pain and constipation.  Genitourinary: Negative for dysuria, frequency and hematuria.  Musculoskeletal: Negative for back pain, joint pain and neck pain.  Skin:       No acne, rash, or lesions  Neurological: Positive for weakness. Negative for dizziness, tremors and focal weakness.  Endo/Heme/Allergies: Negative for polydipsia. Does not bruise/bleed easily.  Psychiatric/Behavioral: Negative for depression. The patient is not nervous/anxious and does not have insomnia.      VITAL SIGNS:   Filed Vitals:   05/24/15 2100 05/24/15 2103 05/24/15 2304 05/25/15 0204  BP:   107/60 109/71  Pulse: 76  77  98  Temp: 98.3 F (36.8 C)     TempSrc: Oral     Resp: 16  16 16   Height: 6' (1.829 m)     Weight: 90.719 kg (200 lb)     SpO2: 96% 97% 96% 95%   Wt Readings from Last 3 Encounters:  05/24/15 90.719 kg (200 lb)  04/12/15 88.451 kg (195 lb)  02/14/15 88.315 kg (194 lb 11.2 oz)    PHYSICAL EXAMINATION:  Physical Exam  Vitals reviewed. Constitutional: He is oriented to person, place, and time. He appears well-developed and well-nourished. No distress.  HENT:  Head: Normocephalic and atraumatic.  Mouth/Throat: Oropharynx is clear and moist.  Eyes: Conjunctivae and EOM are normal. Pupils are equal, round, and reactive to light. No scleral icterus.  Neck: Normal range of motion. Neck supple. No JVD  present. No thyromegaly present.  Cardiovascular: Normal rate, regular rhythm and intact distal pulses.  Exam reveals no gallop and no friction rub.   No murmur heard. Respiratory: Effort normal and breath sounds normal. No respiratory distress. He has no wheezes. He has no rales.  GI: Soft. Bowel sounds are normal. He exhibits no distension. There is no tenderness.  Musculoskeletal: He exhibits edema (1+ bilateral lower extremity).  Quadriplegic. No arthritis, no gout.  Lymphadenopathy:    He has no cervical adenopathy.  Neurological: He is alert and oriented to person, place, and time. No cranial nerve deficit.  No dysarthria, no aphasia  Skin: Skin is warm and dry. No rash noted. No erythema.  Psychiatric: He has a normal mood and affect. His behavior is normal. Judgment and thought content normal.    LABORATORY PANEL:   CBC  Recent Labs Lab 05/24/15 2300  WBC 6.4  HGB 13.9  HCT 43.2  PLT 232   ------------------------------------------------------------------------------------------------------------------  Chemistries   Recent Labs Lab 05/24/15 2300  NA 142  K 4.3  CL 104  CO2 31  GLUCOSE 88  BUN 14  CREATININE 0.54*  CALCIUM 9.7  AST 20  ALT 24  ALKPHOS 72   BILITOT 0.5   ------------------------------------------------------------------------------------------------------------------  Cardiac Enzymes  Recent Labs Lab 05/24/15 2300  TROPONINI <0.03   ------------------------------------------------------------------------------------------------------------------  RADIOLOGY:  Ct Angio Chest Pe W/cm &/or Wo Cm  05/25/2015  CLINICAL DATA:  29 year old male with low oxygen saturation and nonproductive cough. Quadriplegic patient. Labored breathing. EXAM: CT ANGIOGRAPHY CHEST WITH CONTRAST TECHNIQUE: Multidetector CT imaging of the chest was performed using the standard protocol during bolus administration of intravenous contrast. Multiplanar CT image reconstructions and MIPs were obtained to evaluate the vascular anatomy. CONTRAST:  OMNIPAQUE IOHEXOL 350 MG/ML SOLN COMPARISON:  Chest CT 01/29/2015. FINDINGS: Mediastinum/Lymph Nodes: No filling defects within the pulmonary arterial tree to suggest underlying pulmonary embolism. Heart size is normal. There is no significant pericardial fluid, thickening or pericardial calcification. Multiple prominent borderline enlarged and mildly enlarged mediastinal and hilar lymph nodes appear similar to the prior examination, measuring up to 1 cm in short axis in the subcarinal and left hilar nodal stations. Esophagus is unremarkable in appearance. No axillary lymphadenopathy. Lungs/Pleura: Previously noted areas of airspace consolidation have largely resolved, with minimal residual linear opacities in the lung bases bilaterally, most compatible with areas of mild post infectious or inflammatory scarring. There is no new consolidative airspace disease on today's examination. No pleural effusions. No suspicious appearing pulmonary nodules or masses. There continues to be extensive dependent frothy secretions in the left mainstem bronchus extending into both left upper and lower lobe bronchi. Upper Abdomen:  Unremarkable. Musculoskeletal/Soft Tissues: There are no aggressive appearing lytic or blastic lesions noted in the visualized portions of the skeleton. Review of the MIP images confirms the above findings. IMPRESSION: 1. No evidence of pulmonary embolism. 2. Resolution of previously noted multilobar pneumonia. 3. There continues to be a large amount of incompletely cleared secretions in the left-sided bronchi. 4. Reactive mediastinal and hilar lymphadenopathy, similar to the prior examination, as above Electronically Signed   By: Trudie Reed M.D.   On: 05/25/2015 00:47   Dg Chest Portable 1 View  05/24/2015  CLINICAL DATA:  Low oxygen saturations and nonproductive cough. Patient is quadriplegic. EXAM: PORTABLE CHEST 1 VIEW COMPARISON:  04/08/2015 FINDINGS: Low lung volumes without focal lung disease. Heart size is normal. Negative for a pneumothorax. IMPRESSION: No acute chest findings. Electronically Signed  By: Richarda Overlie M.D.   On: 05/24/2015 22:17    EKG:   Orders placed or performed during the hospital encounter of 01/29/15  . ED EKG  . ED EKG  . ED EKG  . ED EKG  . EKG 12-Lead  . EKG 12-Lead  . EKG    IMPRESSION AND PLAN:  Principal Problem:   Hypoxia - CTA chest was to rule out recurrent PE. Patient has a history of being in the past, currently on eliquis.  CT chest did not show a PE, but it did show what is likely mucus plugging. Keep oxygen on tonight, get pulmonology consult for the morning to evaluate for the need for possible bronchoscopy, we will do chest PT with a vest the meantime. Active Problems:   Clostridium difficile infection - patient was on Flagyl outpatient for this. Unclear at this time if this is perhaps incompletely treated. We'll continue Flagyl and add oral vancomycin.   Mucus plugging of bronchi - pulmonology consult as above.   TBI (traumatic brain injury) (HCC) - contributes to patient's quadriplegia. We'll monitor and continue home medications.    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 No PE on CTA chest.  All the records are reviewed and case discussed with ED provider. Management plans discussed with the patient and/or family.  DVT PROPHYLAXIS: systemic anticoagulation  ADMISSION STATUS: Inpatient  CODE STATUS: Full  TOTAL TIME TAKING CARE OF THIS PATIENT: 50 minutes.    Azlaan Isidore FIELDING 05/25/2015, 3:33 AM  [facility]  Office  [phone]  CC: Primary care physician; [name]., MD

## 2015-05-25 NOTE — ED Notes (Signed)
Brown, MD at bedside. 

## 2015-05-25 NOTE — ED Notes (Signed)
Pt. Wears b-pap at night.  Pt. Sleeping in room stats lowered to low 90's 2 L O2 nasal canal hooked up to patient.

## 2015-05-26 ENCOUNTER — Inpatient Hospital Stay: Payer: BLUE CROSS/BLUE SHIELD

## 2015-05-26 MED ORDER — SENNA 8.6 MG PO TABS
1.0000 | ORAL_TABLET | Freq: Every day | ORAL | Status: DC
  Administered 2015-05-26 – 2015-05-28 (×3): 8.6 mg via ORAL
  Filled 2015-05-26 (×3): qty 1

## 2015-05-26 MED ORDER — ENOXAPARIN SODIUM 100 MG/ML ~~LOC~~ SOLN
1.0000 mg/kg | Freq: Two times a day (BID) | SUBCUTANEOUS | Status: DC
  Administered 2015-05-26 – 2015-05-28 (×5): 90 mg via SUBCUTANEOUS
  Filled 2015-05-26 (×7): qty 1

## 2015-05-26 MED ORDER — RISAQUAD PO CAPS: 1.0000 | ORAL_CAPSULE | Freq: Every day | ORAL | Status: DC

## 2015-05-26 MED ORDER — RISAQUAD PO CAPS
1.0000 | ORAL_CAPSULE | Freq: Two times a day (BID) | ORAL | Status: DC
  Administered 2015-05-26 – 2015-05-29 (×7): 1 via ORAL
  Filled 2015-05-26 (×7): qty 1

## 2015-05-26 NOTE — Progress Notes (Signed)
Chest PT given bilaterally to upper lobes. For 6 mins Tolerated well. No distress noted.

## 2015-05-26 NOTE — Progress Notes (Signed)
ANTICOAGULATION CONSULT NOTE - Initial Consult  Pharmacy Consult for Lovenox dosing Indication: VTE prophylaxis  Allergies  Allergen Reactions  . Ambien [Zolpidem] Other (See Comments)    Patient start hallucinating.   . Ativan [Lorazepam] Itching and Other (See Comments)    Patient gets hot and sweaty. Hallucinations   . Depakote Er [Divalproex Sodium Er] Itching and Other (See Comments)    Patient gets hot and sweaty.  . Dilaudid [Hydromorphone] Hives and Other (See Comments)    Patient gets hot and sweaty.  Marland Kitchen. Keppra [Levetiracetam] Itching and Other (See Comments)    Patient gets hot and sweaty.    Patient Measurements: Height: 6' (182.9 cm) Weight: 199 lb 3.2 oz (90.357 kg) IBW/kg (Calculated) : 77.6   Vital Signs: Temp: 98.5 F (36.9 C) (10/28 0507) Temp Source: Oral (10/28 0507) BP: 122/71 mmHg (10/28 0507) Pulse Rate: 81 (10/28 0507)  Labs:  Recent Labs  05/24/15 2300 05/25/15 0644  HGB 13.9 13.2  HCT 43.2 39.5*  PLT 232 232  CREATININE 0.54* 0.49*  TROPONINI <0.03  --     Estimated Creatinine Clearance: 152.2 mL/min (by C-G formula based on Cr of 0.49).   Medical History: Past Medical History  Diagnosis Date  . Hepatitis C   . Scoliosis   . TBI (traumatic brain injury) (HCC) 07/2011  . Subarachnoid hemorrhage (HCC)   . Pulmonary emboli (HCC)   . Hepatitis C   . Depression     Medications:  Scheduled:  . enoxaparin (LOVENOX) injection  1 mg/kg Subcutaneous Q12H  . feeding supplement (JEVITY 1.5 CAL/FIBER)  237 mL Per Tube 5 X Daily  . free water  300 mL Per Tube 4 times per day  . gabapentin  900 mg Oral TID  . Influenza vac split quadrivalent PF  0.5 mL Intramuscular Tomorrow-1000  . lacosamide  200 mg Oral BID  . metroNIDAZOLE  500 mg Oral TID  . OLANZapine  15 mg Oral QHS  . perphenazine  4 mg Oral BID  . sodium chloride  3 mL Intravenous Q12H  . vancomycin  250 mg Oral 4 times per day    Assessment: Pharmacy consulted to dose  Lovenox in a 29 yo male with history of pulmonary emboli.  Patient chronically anticoagulated as an outpatient with Apixaban 5 mg PO BID.  Last apixaban dose charted on 10/27 at 2101.  SCr: 0.49 (0.8), est CrCl~120 mL/min (patient quadriplegic so low muscle mass makes SCr appear lower)  Goal of Therapy:  Monitor CBC and SCr per policy Prevention of embolic events   Plan:  Will start patient on Lovenox 1mg /kg q12h (90 mg subq q12h).  Next dose due ~12 hours after last apixaban dose based on CrCl>30 mL/min.    Pharmacy will continue to follow and monitor.  Ceara Wrightson G 05/26/2015,9:02 AM
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
## 2015-05-26 NOTE — Progress Notes (Signed)
Dr. Graciela HusbandsKlein notified that patient had BM, notes state that patient has c.diff. Asked  MD if he needed us to send a stool sample for any reason. Sample/culture not needed per MD.

## 2015-05-26 NOTE — Progress Notes (Signed)
Fayrene [name] Buller is a 29 y.o. male   SUBJECTIVE:  Patient admitted with progressive hypoxemia and fevers. Was diagnosed with C. difficile several weeks ago and treated with by Flagyl however C. difficile toxin is repeat positive and patient switched to oral vancomycin. Patient presented to the ED with dyspnea and fevers. Chest CT shows mucous plugging, no pulmonary emboli.  Mother reports still with low grade temps, but no fever overnight. Also reports he does not normally have a BM unless given medication, with last stool 48 hrs ago after Magic Bullet.  Pt on CPAP, resting; does not contribute to evaluation today.  ______________________________________________________________________  ROS: Review of systems is unremarkable for any active cardiac,respiratory, GI, GU, hematologic, neurologic or psychiatric systems, 10 systems reviewed.  Marland Kitchen. apixaban  5 mg Oral BID  . feeding supplement (JEVITY 1.5 CAL/FIBER)  237 mL Per Tube 5 X Daily  . free water  300 mL Per Tube 4 times per day  . gabapentin  900 mg Oral TID  . Influenza vac split quadrivalent PF  0.5 mL Intramuscular Tomorrow-1000  . lacosamide  200 mg Oral BID  . metroNIDAZOLE  500 mg Oral TID  . OLANZapine  15 mg Oral QHS  . perphenazine  4 mg Oral BID  . sodium chloride  3 mL Intravenous Q12H  . vancomycin  250 mg Oral 4 times per day   acetaminophen **OR** acetaminophen, albuterol, ondansetron **OR** ondansetron (ZOFRAN) IV   Past Medical History  Diagnosis Date  . Hepatitis C   . Scoliosis   . TBI (traumatic brain injury) (HCC) 07/2011  . Subarachnoid hemorrhage (HCC)   . Pulmonary emboli (HCC)   . Hepatitis C   . Depression     Past Surgical History  Procedure Laterality Date  . Fracture surgery      left acetabulum  . Ileostomy    . Lung tube    . Ankle surgery Bilateral   . Tracheostomy    . Peg tube placement    . Janeway gastrostomy  07/02/2014    PHYSICAL EXAM:  BP 122/71 mmHg  Pulse 81  Temp(Src) 98.5 F  (36.9 C) (Oral)  Resp 20  Ht 6' (1.829 m)  Wt 90.357 kg (199 lb 3.2 oz)  BMI 27.01 kg/m2  SpO2 95%  Wt Readings from Last 3 Encounters:  05/26/15 90.357 kg (199 lb 3.2 oz)  04/12/15 88.451 kg (195 lb)  02/14/15 88.315 kg (194 lb 11.2 oz)           BP Readings from Last 3 Encounters:  05/26/15 122/71  04/12/15 104/60  04/08/15 120/66    Constitutional: NAD HEENT: PERRL, OP dry w/o lesions Neck: decreased ROM, no thyromegaly Respiratory: Rhonchi throughout; poor airflow.  No wheeze Cardiovascular: RRR, no murmur, no gallop Abdomen: soft, good BS, nontender. PEG site clear Extremities: hypertonicity; 2+ foot edema bilaterally, baseline per mother Neuro: alert, follows examiner with eyes. quadriplegia  ASSESSMENT/PLAN:  Labs and imaging studies were reviewed  Mucous plugging with hypoxemia/acute resp failure- pulmonary following; cont chest PT by vest; may need bronch.  Trying to avoid additional abx, given C diff C. Difficile positive- on oral Flagyl and vancomycin; may be at risk for more serious infection, given baseline difficulty evacuating bowels.  Will ask GI to see History of pulmonary embolism-Has been on eliquis.  Change to lovenox in event he needs to go for procedure Traumatic brain injury-no changes, per mother  Lynnea FerrierBERT J KLEIN III, MD

## 2015-05-26 NOTE — Progress Notes (Deleted)
Communicated with Dr. Anne HahnWillis about patient BP 107/67. Nurse informed Dr. Anne HahnWillis that metoprolol 25 mg is scheduled. Dr. Anne HahnWillis informed nurse that she can hold metoprolol 25 mg. Will continue to monitor.Marland Kitchen..Marland Kitchen

## 2015-05-26 NOTE — Consult Note (Signed)
  Pt seen and examined. Please see D. [name]'s notes. Pt with brain injury and quadreplegic. Has persistent c.diff. According to aide, pt has chronic constipation and uses Magic Bullet. Agree with treating with both po vanco and flagyl. One of the reasons why c.diff is not being resolved is his poor ability to have bowel movements. So, recommend continuing laxatives or stool softeners. Would also add probiotic daily. Will order KUB to check for ileus/mega colon that can cause constipation in the setting of c.diff. Will follow. Thanks.

## 2015-05-26 NOTE — Progress Notes (Signed)
Caregiver at bedside. Patient and mother requesting caregiver to assist with tube feeding when she is here as this is what the patient is used to.

## 2015-05-27 LAB — COMPREHENSIVE METABOLIC PANEL
ALBUMIN: 3.5 g/dL (ref 3.5–5.0)
ALT: 21 U/L (ref 17–63)
AST: 15 U/L (ref 15–41)
Alkaline Phosphatase: 59 U/L (ref 38–126)
Anion gap: 4 — ABNORMAL LOW (ref 5–15)
BUN: 11 mg/dL (ref 6–20)
CHLORIDE: 111 mmol/L (ref 101–111)
CO2: 29 mmol/L (ref 22–32)
Calcium: 8.9 mg/dL (ref 8.9–10.3)
Creatinine, Ser: 0.5 mg/dL — ABNORMAL LOW (ref 0.61–1.24)
GFR calc Af Amer: >60 mL/min (ref >60)
Glucose, Bld: 134 mg/dL — ABNORMAL HIGH (ref 65–99)
POTASSIUM: 3.7 mmol/L (ref 3.5–5.1)
Sodium: 144 mmol/L (ref 135–145)
Total Bilirubin: 0.2 mg/dL — ABNORMAL LOW (ref 0.3–1.2)
Total Protein: 6.9 g/dL (ref 6.5–8.1)

## 2015-05-27 LAB — CBC
HCT: 38.3 % — ABNORMAL LOW (ref 40.0–52.0)
Hemoglobin: 12.4 g/dL — ABNORMAL LOW (ref 13.0–18.0)
MCH: 26.4 pg (ref 26.0–34.0)
MCHC: 32.3 g/dL (ref 32.0–36.0)
MCV: 81.8 fL (ref 80.0–100.0)
PLATELETS: 193 10*3/uL (ref 150–440)
RBC: 4.68 MIL/uL (ref 4.40–5.90)
RDW: 14.6 % — AB (ref 11.5–14.5)
WBC: 5.2 10*3/uL (ref 3.8–10.6)

## 2015-05-27 MED ORDER — BISACODYL 10 MG RE SUPP
10.0000 mg | Freq: Every day | RECTAL | Status: DC | PRN
  Administered 2015-05-28: 10 mg via RECTAL
  Filled 2015-05-27: qty 1

## 2015-05-27 NOTE — Progress Notes (Signed)
[name] [name] is a 29 y.o. male   SUBJECTIVE:  Patient admitted with progressive hypoxemia and fevers. Was diagnosed with C. difficile several weeks ago and treated with by Flagyl however C. difficile toxin was repeated and positive and patient switched to oral vancomycin. Patient presented to the ED with dyspnea and fevers. Chest CT shows mucous plugging, no pulmonary emboli.  Has made good improvement; sats in low 90's on RA yesterday. On CPAP when sleeping; in place currently.  No fever, chills.  Report of 1 small BM from nursing yesterday.  GI following; abd films with some constipation.    ______________________________________________________________________  ROS: Review of systems is unremarkable for any active cardiac,respiratory, GI, GU, hematologic, neurologic or psychiatric systems, 10 systems reviewed, though limited by pt ability to respond.  Marland Kitchen acidophilus  1 capsule Oral BID  . enoxaparin (LOVENOX) injection  1 mg/kg Subcutaneous Q12H  . feeding supplement (JEVITY 1.5 CAL/FIBER)  237 mL Per Tube 5 X Daily  . free water  300 mL Per Tube 4 times per day  . gabapentin  900 mg Oral TID  . Influenza vac split quadrivalent PF  0.5 mL Intramuscular Tomorrow-1000  . lacosamide  200 mg Oral BID  . metroNIDAZOLE  500 mg Oral TID  . OLANZapine  15 mg Oral QHS  . perphenazine  4 mg Oral BID  . senna  1 tablet Oral QHS  . sodium chloride  3 mL Intravenous Q12H  . vancomycin  250 mg Oral 4 times per day   acetaminophen **OR** acetaminophen, albuterol, bisacodyl, ondansetron **OR** ondansetron (ZOFRAN) IV   Past Medical History  Diagnosis Date  . Hepatitis C   . Scoliosis   . TBI (traumatic brain injury) (HCC) 07/2011  . Subarachnoid hemorrhage (HCC)   . Pulmonary emboli (HCC)   . Hepatitis C   . Depression     Past Surgical History  Procedure Laterality Date  . Fracture surgery      left acetabulum  . Ileostomy    . Lung tube    . Ankle surgery Bilateral   . Tracheostomy     . Peg tube placement    . Janeway gastrostomy  07/02/2014    PHYSICAL EXAM:  BP 119/61 mmHg  Pulse 77  Temp(Src) 98.5 F (36.9 C) (Oral)  Resp 18  Ht 6' (1.829 m)  Wt 90.357 kg (199 lb 3.2 oz)  BMI 27.01 kg/m2  SpO2 92%  Wt Readings from Last 3 Encounters:  05/26/15 90.357 kg (199 lb 3.2 oz)  04/12/15 88.451 kg (195 lb)  02/14/15 88.315 kg (194 lb 11.2 oz)           BP Readings from Last 3 Encounters:  05/27/15 119/61  04/12/15 104/60  04/08/15 120/66    Constitutional: Pt on CPAP, in NAD HEENT: PERRL, OP moist w/o lesions Neck: decreased ROM, no thyromegaly Respiratory: occ crackles. No wheeze Cardiovascular: RRR, no murmur, no gallop Abdomen: soft, good BS, nontender. PEG site clear Extremities: hypertonicity; 1+ foot edema bilaterally.  Dressing over pressure sore left foot. Neuro: opens eyes when first talking to pt, then closes them and does not interact with examiner;  quadriplegia  ASSESSMENT/PLAN:  Labs and imaging studies were reviewed  Mucous plugging with hypoxemia/acute resp failure- Cont chest PT; sats improved.  Pulmonary following.  Cont CPAP when sleeping C. Difficile positive- appreciate GI; cont on oral Flagyl and vancomycin; at risk for more serious infection, given baseline difficulty evacuating bowels.  Needs dulcolax to move bowels, per  family, and will initiate this, given findings on abd films History of pulmonary embolism-Have changed eliquis to lovenox for now in event he needs procedure; following CBC Traumatic brain injury- mental status at baseline, per discussion with pt's mother  F/E/N- reduce IVF rate; appreciate dietary.  Cont supplementation through G tube Pressure ulcer- monitoring; cont dressing  [name] Hazelrigg Maryjean KaJ [name], MD

## 2015-05-27 NOTE — Progress Notes (Signed)
Patient being transfer to 1A, report given to April. Patient in no distress. No complaints of pain. Notified mother of transfer.

## 2015-05-27 NOTE — Progress Notes (Signed)
Chest PT given bilaterally to right and left upper lobes for 6 mins tolerated well. No distress noted. Pt on home Bipap.

## 2015-05-27 NOTE — Progress Notes (Signed)
Pt wanted to go no his bipap machine but refused CPT. Nure notified.

## 2015-05-27 NOTE — Progress Notes (Signed)
Patient ID: [name] [name] [name], male   DOB: 10/10/1985, 29 y.o.   MRN: [id number] Pt sleeping with CPAP/BIPAP on. Abdominal x-ray showed evidence of constipation. No ileus or mega colon seen. Agree with po vanco/flagyl. Agree with daily laxatives. Daily probiotics as well. Will follow.

## 2015-05-28 LAB — URINE CULTURE: Special Requests: NORMAL

## 2015-05-28 LAB — CBC
HCT: 37.4 % — ABNORMAL LOW (ref 40.0–52.0)
HEMOGLOBIN: 12.1 g/dL — AB (ref 13.0–18.0)
MCH: 26.4 pg (ref 26.0–34.0)
MCHC: 32.2 g/dL (ref 32.0–36.0)
MCV: 81.8 fL (ref 80.0–100.0)
Platelets: 211 10*3/uL (ref 150–440)
RBC: 4.57 MIL/uL (ref 4.40–5.90)
RDW: 14.9 % — ABNORMAL HIGH (ref 11.5–14.5)
WBC: 5.3 10*3/uL (ref 3.8–10.6)

## 2015-05-28 MED ORDER — APIXABAN 5 MG PO TABS
5.0000 mg | ORAL_TABLET | Freq: Two times a day (BID) | ORAL | Status: DC
  Administered 2015-05-28 – 2015-05-29 (×3): 5 mg via ORAL
  Filled 2015-05-28 (×3): qty 1

## 2015-05-28 MED ORDER — RISAQUAD PO CAPS: 1.0000 | ORAL_CAPSULE | Freq: Two times a day (BID) | ORAL | Status: DC

## 2015-05-28 MED ORDER — VANCOMYCIN 50 MG/ML ORAL SOLUTION: 250.0000 mg | Freq: Four times a day (QID) | ORAL | Status: DC

## 2015-05-28 MED ORDER — SENNA 8.6 MG PO TABS: 1.0000 | ORAL_TABLET | Freq: Every day | ORAL | Status: DC

## 2015-05-28 MED ORDER — BISACODYL 10 MG RE SUPP: 10.0000 mg | Freq: Every day | RECTAL | Status: DC | PRN

## 2015-05-28 NOTE — Progress Notes (Signed)
[name] [name] is a 29 y.o. male   SUBJECTIVE:  Patient admitted with progressive hypoxemia and fevers. Was diagnosed with C. difficile several weeks ago and treated with by Flagyl however C. difficile toxin was repeated and positive and patient switched to oral vancomycin.  Chest CT showed mucous plugging, no pulmonary emboli.  No further fevers or resp difficulty.  No diarrhea (has chronic constipation).  Pt reports he is not sick and does not have C diff.  ______________________________________________________________________  ROS: Review of systems is unremarkable for any active cardiac,respiratory, GI, GU, hematologic, neurologic or psychiatric systems, 10 systems reviewed.  Marland Kitchen acidophilus  1 capsule Oral BID  . enoxaparin (LOVENOX) injection  1 mg/kg Subcutaneous Q12H  . feeding supplement (JEVITY 1.5 CAL/FIBER)  237 mL Per Tube 5 X Daily  . free water  300 mL Per Tube 4 times per day  . gabapentin  900 mg Oral TID  . lacosamide  200 mg Oral BID  . metroNIDAZOLE  500 mg Oral TID  . OLANZapine  15 mg Oral QHS  . perphenazine  4 mg Oral BID  . senna  1 tablet Oral QHS  . sodium chloride  3 mL Intravenous Q12H  . vancomycin  250 mg Oral 4 times per day   acetaminophen **OR** acetaminophen, albuterol, bisacodyl, ondansetron **OR** ondansetron (ZOFRAN) IV   Past Medical History  Diagnosis Date  . Hepatitis C   . Scoliosis   . TBI (traumatic brain injury) (HCC) 07/2011  . Subarachnoid hemorrhage (HCC)   . Pulmonary emboli (HCC)   . Hepatitis C   . Depression     Past Surgical History  Procedure Laterality Date  . Fracture surgery      left acetabulum  . Ileostomy    . Lung tube    . Ankle surgery Bilateral   . Tracheostomy    . Peg tube placement    . Janeway gastrostomy  07/02/2014    PHYSICAL EXAM:  BP 118/64 mmHg  Pulse 78  Temp(Src) 98.7 F (37.1 C) (Oral)  Resp 18  Ht 6' (1.829 m)  Wt 90.357 kg (199 lb 3.2 oz)  BMI 27.01 kg/m2  SpO2 94%  Wt Readings  from Last 3 Encounters:  05/26/15 90.357 kg (199 lb 3.2 oz)  04/12/15 88.451 kg (195 lb)  02/14/15 88.315 kg (194 lb 11.2 oz)           BP Readings from Last 3 Encounters:  05/28/15 118/64  04/12/15 104/60  04/08/15 120/66    Constitutional: Pt on CPAP, in NAD HEENT: PERRL, OP moist w/o lesions Neck: decreased ROM, no thyromegaly Respiratory: few basilar crackles. No wheeze or retractions Cardiovascular: RRR, no murmur, no gallop Abdomen: soft, good BS, nontender. PEG site clear Extremities: hypertonicity; minimal foot edema bilaterally.  Dressing over pressure sore left foot. Neuro: alert;  quadriplegia  ASSESSMENT/PLAN:  Labs and imaging studies were reviewed  Mucous plugging with hypoxemia/acute resp failure- has responded well to chest PT; sats improved.  Pulmonary following.  Cont CPAP when sleeping C. Difficile positive- appreciate GI; cont on oral Flagyl and vancomycin; at risk for more serious infection, given baseline difficulty evacuating bowels.  cont dulcolax to move bowels History of pulmonary embolism-Had changed eliquis to lovenox in event he needed procedure; will change back to Eliquis. cont following CBC periodically Traumatic brain injury- mental status appears at baseline F/E/N- off ivf; appreciate dietary.  Cont supplementation through G tube Pressure ulcer- monitoring; cont dressing D/c plan- likely home in AM if  remains stable.  Ova Meegan Nani RavensJ Lorell Thibodaux III, MD

## 2015-05-28 NOTE — Progress Notes (Addendum)
Patient refused feeding supplements today. He had a good breakfast, lunch & family brought [name] late afternoon. Patient is eating dinner & I will see if he refuses supplement after he eats. Family said that if he eats well that he sometimes does not want feeding.

## 2015-05-29 LAB — CULTURE, RESPIRATORY

## 2015-05-29 LAB — CULTURE, RESPIRATORY W GRAM STAIN: Special Requests: NORMAL

## 2015-05-29 NOTE — Discharge Summary (Signed)
[name] [name], is a 29 y.o. male  DOB 10/26/1985  MRN [id number].  Admission date:  05/24/2015  Admitting Physician  Oralia Manisavid Willis, MD  Discharge Date:  05/29/2015    Admission Diagnosis  Hypoxia [R09.02]  Discharge Diagnoses    C diff colitis Mucous plugging Pulmonary Embolus history Closed traumatic head injury Hepatitis C   Past Medical History  Diagnosis Date  . Hepatitis C   . Scoliosis   . TBI (traumatic brain injury) (HCC) 07/2011  . Subarachnoid hemorrhage (HCC)   . Pulmonary emboli (HCC)   . Hepatitis C   . Depression     Past Surgical History  Procedure Laterality Date  . Fracture surgery      left acetabulum  . Ileostomy    . Lung tube    . Ankle surgery Bilateral   . Tracheostomy    . Peg tube placement    . Janeway gastrostomy  07/02/2014       History of present illness and  Hospital Course:     Kindly see H&P for history of present illness and admission details, please review complete Labs, Consult reports and Test reports for all details in brief  HPI  from the history and physical done on the day of admission    Hospital Course    Patient was admitted and treated with dual therapy for C. difficile colitis. Sputum culture still pending, chest CT did not show acute infiltrate but did show some mucus plugging. Pulmonary suggested a chest PT vest, paperwork filled out. Ultimately may benefit from bronchoscopy. Due to insufficient calories, patient set up for a feeding pump.   Discharge Condition: Stable   Follow UP  Dr. Hyacinth MeekerMiller 2 weeks    Discharge Instructions  and  Discharge Medications   Probiotic 1 tab twice a day Albuterol nebulizer twice a day when necessary Dulcolax 10 mg suppository when necessary Eliquis 5 mg twice a day Feeding supplement 237 cc Jevity 1.5 CAL/fiber 5 times daily Free water 300 cc via tube every 6 hours Gabapentin 900 mg 3  times a day Indomethacin 75 mg daily Vimpat 200 mg twice a day  Flagyl 500 mg 3 times a day 10 days Vancomycin 250 mg 4 times daily 10 days Multivitamin daily Zyprexa 15 mg at bedtime Trilafon 4 mg twice a day Actonel 35 mg weekly Senna one tab at bedtime  Today   Subjective:   [name] [name] today has no headache,no chest abdominal pain,no new weakness tingling or numbness, feels much better wants to go home today.   Objective:   Blood pressure 118/68, pulse 92, temperature 99 F (37.2 C), temperature source Axillary, resp. rate 16, height 6' (1.829 m), weight 90.357 kg (199 lb 3.2 oz), SpO2 93 %.   Exam Awake Alert, Oriented x 3, No new F.N deficits, Normal affect [place].AT,PERRAL Supple Neck,No JVD, No cervical lymphadenopathy appriciated.  Symmetrical Chest wall movement, Good air movement bilaterally, CTAB RRR,No Gallops,Rubs or new Murmurs, Abd Soft, Non tender, No rebound. No Cyanosis, Clubbing or  edema, No new Rash or bruise  Total Time in preparing paper work, data evaluation and todays exam - 35 minutes  MILLER,MARK F. M.D on 05/29/2015 at 1:14 PM

## 2015-05-29 NOTE — Consult Note (Signed)
  For discharge today. Agree that flagyl can be discontinued at this time, since it did not get rid of c.diff Ag. Would discharge pt on vanco x 14 day course, daily probiotic/yogurt, and stool softeners. Repeat c.diff Ag in 2 weeks. If Ag remains positive, pt can f/u with us. Will sign off. Thanks.

## 2015-05-29 NOTE — Progress Notes (Signed)
Pt discharged home without prescriptions. Called in medications to patients pharmacy. Flagyl 500 mg 1 tid #30 Vancomycin 250 mg 1 qid #40 verified with Dr Bethann PunchesMark Miller.

## 2015-05-29 NOTE — Care Management (Addendum)
Patient discharging home today by EMS. Packet created. Patient' mom requesting feeding pump and percussion vest. I am checking with Will with [facility] on status of these items prior to discharge today so that I can follow up with Dr. Hyacinth MeekerMiller. Patient's mom declined HHPT and home health. Rx obtained for tube feeding and percussion vest.  No further RNCM needs. Case closed.

## 2015-05-30 LAB — CULTURE, BLOOD (ROUTINE X 2)
CULTURE: NO GROWTH
CULTURE: NO GROWTH
CULTURE: NO GROWTH
CULTURE: NO GROWTH

## 2015-06-06 ENCOUNTER — Ambulatory Visit (INDEPENDENT_AMBULATORY_CARE_PROVIDER_SITE_OTHER): Payer: BLUE CROSS/BLUE SHIELD | Admitting: Pulmonary Disease

## 2015-06-06 ENCOUNTER — Encounter: Payer: Self-pay | Admitting: Pulmonary Disease

## 2015-06-06 VITALS — BP 124/62 | HR 83 | Ht 72.0 in | Wt 200.0 lb

## 2015-06-06 DIAGNOSIS — T17500A Unspecified foreign body in bronchus causing asphyxiation, initial encounter: Secondary | ICD-10-CM

## 2015-06-06 DIAGNOSIS — J189 Pneumonia, unspecified organism: Secondary | ICD-10-CM

## 2015-06-06 DIAGNOSIS — S069X0S Unspecified intracranial injury without loss of consciousness, sequela: Secondary | ICD-10-CM | POA: Diagnosis not present

## 2015-06-06 DIAGNOSIS — J9809 Other diseases of bronchus, not elsewhere classified: Secondary | ICD-10-CM

## 2015-06-06 MED ORDER — FLUTTER DEVI: Status: DC

## 2015-06-08 ENCOUNTER — Encounter: Payer: Self-pay | Admitting: Pulmonary Disease
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
## 2015-06-09 ENCOUNTER — Telehealth: Payer: Self-pay | Admitting: Pulmonary Disease

## 2015-06-09 DIAGNOSIS — G8250 Quadriplegia, unspecified: Secondary | ICD-10-CM

## 2015-06-09 NOTE — Telephone Encounter (Signed)
Per DS, Incourage vest needs to be ordered.

## 2015-06-09 NOTE — Telephone Encounter (Signed)
Called and spoke to pt's mother and advised that per Dr. Sung AmabileSimonds we will try the incourage vest.  Order completed and faxed to [employer]. Mother advised that she should hear something from [employer] by Monday to arrange. Form completed and faxed to [employer]. Nothing else needed at this time. Rhonda J Cobb

## 2015-06-09 NOTE — Telephone Encounter (Signed)
Percussion Vest [place] [employer] .   Per Mom Peer to peer needs to be done.  Will Dr. Sung AmabileSimonds do this with [facility] or with Will Dareen PianoAnderson with [facility].   Per mom Sung AmabileSimonds has contact information.    Please call patient mother to discuss.

## 2015-06-13 ENCOUNTER — Other Ambulatory Visit: Payer: Self-pay | Admitting: Internal Medicine

## 2015-06-13 DIAGNOSIS — T17908A Unspecified foreign body in respiratory tract, part unspecified causing other injury, initial encounter: Secondary | ICD-10-CM

## 2015-06-19 ENCOUNTER — Inpatient Hospital Stay
Admission: AD | Admit: 2015-06-19 | Discharge: 2015-06-25 | DRG: 373 | Disposition: A | Discharge status: Home / Self Care | Payer: BLUE CROSS/BLUE SHIELD | Source: Ambulatory Visit | Attending: Internal Medicine | Admitting: Internal Medicine

## 2015-06-19 DIAGNOSIS — M419 Scoliosis, unspecified: Secondary | ICD-10-CM | POA: Diagnosis present

## 2015-06-19 DIAGNOSIS — Z87820 Personal history of traumatic brain injury: Secondary | ICD-10-CM | POA: Diagnosis not present

## 2015-06-19 DIAGNOSIS — A0472 Enterocolitis due to Clostridium difficile, not specified as recurrent: Secondary | ICD-10-CM | POA: Diagnosis present

## 2015-06-19 DIAGNOSIS — Z87891 Personal history of nicotine dependence: Secondary | ICD-10-CM

## 2015-06-19 DIAGNOSIS — Z86711 Personal history of pulmonary embolism: Secondary | ICD-10-CM | POA: Diagnosis not present

## 2015-06-19 DIAGNOSIS — Z888 Allergy status to other drugs, medicaments and biological substances status: Secondary | ICD-10-CM

## 2015-06-19 DIAGNOSIS — A047 Enterocolitis due to Clostridium difficile: Secondary | ICD-10-CM | POA: Diagnosis present

## 2015-06-19 DIAGNOSIS — B1920 Unspecified viral hepatitis C without hepatic coma: Secondary | ICD-10-CM | POA: Diagnosis present

## 2015-06-19 DIAGNOSIS — Z79899 Other long term (current) drug therapy: Secondary | ICD-10-CM

## 2015-06-19 DIAGNOSIS — F329 Major depressive disorder, single episode, unspecified: Secondary | ICD-10-CM | POA: Diagnosis present

## 2015-06-19 DIAGNOSIS — Z7901 Long term (current) use of anticoagulants: Secondary | ICD-10-CM | POA: Diagnosis not present

## 2015-06-19 LAB — CBC
HEMATOCRIT: 41.7 % (ref 40.0–52.0)
Hemoglobin: 13.3 g/dL (ref 13.0–18.0)
MCH: 25.8 pg — ABNORMAL LOW (ref 26.0–34.0)
MCHC: 31.9 g/dL — AB (ref 32.0–36.0)
MCV: 81.1 fL (ref 80.0–100.0)
PLATELETS: 251 10*3/uL (ref 150–440)
RBC: 5.13 MIL/uL (ref 4.40–5.90)
RDW: 14.9 % — AB (ref 11.5–14.5)
WBC: 6.9 10*3/uL (ref 3.8–10.6)

## 2015-06-19 LAB — COMPREHENSIVE METABOLIC PANEL
ALT: 71 U/L — AB (ref 17–63)
AST: 34 U/L (ref 15–41)
Albumin: 3.9 g/dL (ref 3.5–5.0)
Alkaline Phosphatase: 67 U/L (ref 38–126)
Anion gap: 6 (ref 5–15)
BILIRUBIN TOTAL: 0.5 mg/dL (ref 0.3–1.2)
BUN: 12 mg/dL (ref 6–20)
CALCIUM: 9.5 mg/dL (ref 8.9–10.3)
CHLORIDE: 104 mmol/L (ref 101–111)
CO2: 31 mmol/L (ref 22–32)
CREATININE: 0.43 mg/dL — AB (ref 0.61–1.24)
Glucose, Bld: 91 mg/dL (ref 65–99)
Potassium: 4.3 mmol/L (ref 3.5–5.1)
Sodium: 141 mmol/L (ref 135–145)
TOTAL PROTEIN: 7.5 g/dL (ref 6.5–8.1)

## 2015-06-19 LAB — MAGNESIUM: MAGNESIUM: 2 mg/dL (ref 1.7–2.4)

## 2015-06-19 MED ORDER — ACETAMINOPHEN 325 MG PO TABS
650.0000 mg | ORAL_TABLET | Freq: Four times a day (QID) | ORAL | Status: DC | PRN
  Filled 2015-06-19: qty 2

## 2015-06-19 MED ORDER — POTASSIUM CHLORIDE IN NACL 20-0.9 MEQ/L-% IV SOLN
INTRAVENOUS | Status: DC
  Administered 2015-06-19: 23:00:00 via INTRAVENOUS
  Administered 2015-06-20: 1 mL via INTRAVENOUS
  Administered 2015-06-20 – 2015-06-22 (×3): via INTRAVENOUS
  Administered 2015-06-22: 1000 mL via INTRAVENOUS
  Administered 2015-06-23 – 2015-06-25 (×4): via INTRAVENOUS
  Filled 2015-06-19 (×16): qty 1000

## 2015-06-19 MED ORDER — FREE WATER
300.0000 mL | Freq: Four times a day (QID) | Status: DC
  Administered 2015-06-19 – 2015-06-20 (×2): 300 mL

## 2015-06-19 MED ORDER — PERPHENAZINE 4 MG PO TABS
4.0000 mg | ORAL_TABLET | Freq: Two times a day (BID) | ORAL | Status: DC
  Administered 2015-06-19 – 2015-06-25 (×12): 4 mg via ORAL
  Filled 2015-06-19 (×14): qty 1

## 2015-06-19 MED ORDER — RISAQUAD PO CAPS
1.0000 | ORAL_CAPSULE | Freq: Two times a day (BID) | ORAL | Status: DC
  Administered 2015-06-19 – 2015-06-25 (×12): 1 via ORAL
  Filled 2015-06-19 (×12): qty 1

## 2015-06-19 MED ORDER — ALBUTEROL SULFATE (2.5 MG/3ML) 0.083% IN NEBU: 2.5000 mg | INHALATION_SOLUTION | RESPIRATORY_TRACT | Status: DC | PRN

## 2015-06-19 MED ORDER — HEPARIN SODIUM (PORCINE) 5000 UNIT/ML IJ SOLN
5000.0000 [IU] | Freq: Three times a day (TID) | INTRAMUSCULAR | Status: DC
Start: 2015-06-19 — End: 2015-06-20
  Administered 2015-06-19: 5000 [IU] via SUBCUTANEOUS
  Filled 2015-06-19: qty 1

## 2015-06-19 MED ORDER — OLANZAPINE 7.5 MG PO TABS
7.5000 mg | ORAL_TABLET | Freq: Every day | ORAL | Status: DC
  Administered 2015-06-19 – 2015-06-24 (×6): 7.5 mg via ORAL
  Filled 2015-06-19 (×6): qty 1

## 2015-06-19 MED ORDER — LACOSAMIDE 200 MG PO TABS
200.0000 mg | ORAL_TABLET | Freq: Two times a day (BID) | ORAL | Status: DC
Start: 2015-06-19 — End: 2015-06-25
  Administered 2015-06-19 – 2015-06-25 (×12): 200 mg via ORAL
  Filled 2015-06-19 (×2): qty 4
  Filled 2015-06-19: qty 1
  Filled 2015-06-19 (×5): qty 4
  Filled 2015-06-19 (×2): qty 1
  Filled 2015-06-19: qty 4
  Filled 2015-06-19 (×3): qty 1

## 2015-06-19 MED ORDER — APIXABAN 5 MG PO TABS
5.0000 mg | ORAL_TABLET | Freq: Two times a day (BID) | ORAL | Status: DC
  Administered 2015-06-19 – 2015-06-25 (×12): 5 mg via ORAL
  Filled 2015-06-19 (×12): qty 1

## 2015-06-19 MED ORDER — ONDANSETRON HCL 4 MG/2ML IJ SOLN: 4.0000 mg | Freq: Four times a day (QID) | INTRAMUSCULAR | Status: DC | PRN

## 2015-06-19 MED ORDER — OLANZAPINE 7.5 MG PO TABS: 15.0000 mg | ORAL_TABLET | Freq: Every day | ORAL | Status: DC

## 2015-06-19 MED ORDER — ONDANSETRON HCL 4 MG PO TABS: 4.0000 mg | ORAL_TABLET | Freq: Four times a day (QID) | ORAL | Status: DC | PRN

## 2015-06-19 MED ORDER — ALBUTEROL SULFATE (2.5 MG/3ML) 0.083% IN NEBU
2.5000 mg | INHALATION_SOLUTION | Freq: Three times a day (TID) | RESPIRATORY_TRACT | Status: DC
  Administered 2015-06-19 – 2015-06-23 (×11): 2.5 mg via RESPIRATORY_TRACT
  Filled 2015-06-19 (×12): qty 3

## 2015-06-19 MED ORDER — ADULT MULTIVITAMIN W/MINERALS CH
1.0000 | ORAL_TABLET | Freq: Every day | ORAL | Status: DC
  Administered 2015-06-19 – 2015-06-25 (×7): 1 via ORAL
  Filled 2015-06-19 (×7): qty 1

## 2015-06-19 MED ORDER — GABAPENTIN 300 MG PO CAPS
900.0000 mg | ORAL_CAPSULE | Freq: Three times a day (TID) | ORAL | Status: DC
  Administered 2015-06-19 – 2015-06-25 (×17): 900 mg via ORAL
  Filled 2015-06-19 (×17): qty 3

## 2015-06-19 MED ORDER — INDOMETHACIN ER 75 MG PO CPCR
75.0000 mg | ORAL_CAPSULE | Freq: Every day | ORAL | Status: DC
  Administered 2015-06-20 – 2015-06-25 (×6): 75 mg via ORAL
  Filled 2015-06-19 (×7): qty 1

## 2015-06-19 MED ORDER — JEVITY 1.5 CAL/FIBER PO LIQD
237.0000 mL | Freq: Every day | ORAL | Status: DC
Start: 2015-06-19 — End: 2015-06-21
  Administered 2015-06-19 – 2015-06-21 (×8): 237 mL

## 2015-06-19 MED ORDER — ACETAMINOPHEN 650 MG RE SUPP: 650.0000 mg | Freq: Four times a day (QID) | RECTAL | Status: DC | PRN

## 2015-06-19 MED ORDER — RISEDRONATE SODIUM 5 MG PO TABS: 35.0000 mg | ORAL_TABLET | ORAL | Status: DC

## 2015-06-19 MED ORDER — METRONIDAZOLE IN NACL 5-0.79 MG/ML-% IV SOLN
500.0000 mg | Freq: Three times a day (TID) | INTRAVENOUS | Status: DC
  Administered 2015-06-20 (×2): 500 mg via INTRAVENOUS
  Filled 2015-06-19 (×5): qty 100

## 2015-06-19 MED ORDER — CRANBERRY 1000 MG PO CAPS: 1000.0000 mg | ORAL_CAPSULE | Freq: Every day | ORAL | Status: DC

## 2015-06-19 MED ORDER — VANCOMYCIN 50 MG/ML ORAL SOLUTION
250.0000 mg | Freq: Four times a day (QID) | ORAL | Status: DC
  Administered 2015-06-19 – 2015-06-20 (×3): 250 mg via ORAL
  Filled 2015-06-19 (×6): qty 5

## 2015-06-19 MED ORDER — NYSTATIN 100000 UNIT/GM EX POWD
1.0000 g | Freq: Two times a day (BID) | CUTANEOUS | Status: DC
  Administered 2015-06-19 – 2015-06-25 (×12): 1 g via TOPICAL
  Filled 2015-06-19: qty 15

## 2015-06-19 NOTE — H&P (Signed)
[facility] [place] at [facility]   PATIENT NAME: [name] [name]    MR#:  [id number]  DATE OF BIRTH:  [birth year]  DATE OF ADMISSION:  06/19/2015  PRIMARY CARE PHYSICIAN: [name]., MD   REQUESTING/REFERRING PHYSICIAN: Dr. Bethann Punches  CHIEF COMPLAINT:  No chief complaint on file.  C. difficile colitis  HISTORY OF PRESENT ILLNESS:  [name] [name]  is a 29 y.o. male with a known history of C. difficile colitis, TBI and pulmonary emboli. Per his mother, the patient was discharged 27 weeks ago after treatment of C. difficile colitis. He completed vancomycin and Flagyl course after discharge. However, the patient still has had diarrhea since discharge. In addition, the patient has had low grade fever and worsening diarrhea for the past 5 days. Dr. Hyacinth Meeker saw the patient and got stool C. difficile 4 days ago test which is positive. So Dr. Hyacinth Meeker called hospitalist for direct admission. The patient cannot provide information.  PAST MEDICAL HISTORY:   Past Medical History  Diagnosis Date  . Hepatitis C   . Scoliosis   . TBI (traumatic brain injury) (HCC) 07/2011  . Subarachnoid hemorrhage (HCC)   . Pulmonary emboli (HCC)   . Hepatitis C   . Depression     PAST SURGICAL HISTORY:   Past Surgical History  Procedure Laterality Date  . Fracture surgery      left acetabulum  . Ileostomy    . Lung tube    . Ankle surgery Bilateral   . Tracheostomy    . Peg tube placement    . Janeway gastrostomy  07/02/2014  . Ivc filter placement ([facility] hx)      SOCIAL HISTORY:   Social History  Substance Use Topics  . Smoking status: Former Smoker    Quit date: 08/07/2011  . Smokeless tobacco: Never Used  . Alcohol Use: No    FAMILY HISTORY:   Family History  Problem Relation Age of Onset  . Hypertension    . Hyperlipidemia Father   . Autoimmune disease Mother   . Cancer Maternal Grandmother   . Heart disease Maternal Grandfather     DRUG ALLERGIES:    Allergies  Allergen Reactions  . Ambien [Zolpidem] Other (See Comments)    Patient start hallucinating.   . Ativan [Lorazepam] Itching and Other (See Comments)    Patient gets hot and sweaty. Hallucinations   . Depakote Er [Divalproex Sodium Er] Itching and Other (See Comments)    Patient gets hot and sweaty.  . Dilaudid [Hydromorphone] Hives and Other (See Comments)    Patient gets hot and sweaty.  Marland Kitchen Keppra [Levetiracetam] Itching and Other (See Comments)    Patient gets hot and sweaty.    REVIEW OF SYSTEMS:  Unable to provide ROS.  MEDICATIONS AT HOME:   Prior to Admission medications   Medication Sig Start Date End Date Taking? Authorizing Provider  acidophilus (RISAQUAD) CAPS capsule Take 1 capsule by mouth 2 (two) times daily. 05/28/15   [name], MD  albuterol (PROVENTIL) (2.5 MG/3ML) 0.083% nebulizer solution Inhale 3 mLs into the lungs 2 (two) times daily as needed. 11/30/12   Historical Provider, MD  apixaban (ELIQUIS) 5 MG TABS tablet Take 5 mg by mouth 2 (two) times daily.    Historical Provider, MD  bisacodyl (DULCOLAX) 10 MG suppository Place 1 suppository (10 mg total) rectally daily as needed for moderate constipation. 05/28/15   [name], MD  Cranberry 1000 MG CAPS Take 1,000 mg  by mouth daily.     Historical Provider, MD  gabapentin (NEURONTIN) 300 MG capsule Take 900 mg by mouth 3 (three) times daily.     Historical Provider, MD  indomethacin (INDOCIN SR) 75 MG CR capsule Take 75 mg by mouth daily.    Historical Provider, MD  lacosamide (VIMPAT) 200 MG TABS tablet Take 200 mg by mouth 2 (two) times daily.    Historical Provider, MD  metroNIDAZOLE (FLAGYL) 500 MG tablet Take 500 mg by mouth 3 (three) times daily.    Historical Provider, MD  Multiple Vitamins-Minerals (MULTIVITAMIN WITH MINERALS) tablet Take 1 tablet by mouth daily.    Historical Provider, MD  Nutritional Supplements (FEEDING SUPPLEMENT, JEVITY 1.5 CAL/FIBER,) LIQD Place 237 mLs into  feeding tube 5 (five) times daily. 02/13/15   [name], MD  nystatin (MYCOSTATIN/NYSTOP) 100000 UNIT/GM POWD Apply 1 g topically 2 (two) times daily.  12/05/14   Historical Provider, MD  OLANZapine (ZYPREXA) 15 MG tablet Take 15 mg by mouth at bedtime.    Historical Provider, MD  perphenazine (TRILAFON) 4 MG tablet Take 4 mg by mouth 2 (two) times daily.    Historical Provider, MD  Respiratory Therapy Supplies (FLUTTER) DEVI Use as directed 06/06/15   Merwyn Katos, MD  risedronate (ACTONEL) 35 MG tablet Take 35 mg by mouth every 7 (seven) days. with water on empty stomach, nothing by mouth or lie down for next 30 minutes. Take on Saturday.    Historical Provider, MD  senna (SENOKOT) 8.6 MG TABS tablet Take 1 tablet (8.6 mg total) by mouth at bedtime. 05/28/15   [name], MD  vancomycin (VANCOCIN) 50 mg/mL oral solution Take 5 mLs (250 mg total) by mouth every 6 (six) hours. 05/28/15   [name], MD  Water For Irrigation, Sterile (FREE WATER) SOLN Place 300 mLs into feeding tube every 6 (six) hours. 02/05/15   [name], MD      VITAL SIGNS:  Blood pressure 124/73, pulse 85, temperature 97.5 F (36.4 C), temperature source Oral, resp. rate 20, SpO2 96 %.  PHYSICAL EXAMINATION:  GENERAL:  29 y.o.-year-old patient lying in the bed with no acute distress.  EYES: Pupils equal, round, reactive to light and accommodation. No scleral icterus. Extraocular muscles intact.  HEENT: Head atraumatic, normocephalic. Oropharynx and nasopharynx clear.  NECK:  Supple, no jugular venous distention. No thyroid enlargement, no tenderness.  LUNGS: Normal breath sounds bilaterally, no wheezing, rales,rhonchi or crepitation. No use of accessory muscles of respiration.  CARDIOVASCULAR: S1, S2 normal. No murmurs, rubs, or gallops.  ABDOMEN: Soft, nontender, nondistended. Bowel sounds present. No organomegaly or mass. PEG tube in situ. EXTREMITIES: No pedal edema, cyanosis, or clubbing.  NEUROLOGIC:  Mumbling, does not follow commands. PSYCHIATRIC: The patient is alert, awake but does not follow command. SKIN: No obvious rash, lesion, or ulcer.   LABORATORY PANEL:   CBC No results for input(s): WBC, HGB, HCT, PLT in the last 168 hours. ------------------------------------------------------------------------------------------------------------------  Chemistries  No results for input(s): NA, K, CL, CO2, GLUCOSE, BUN, CREATININE, CALCIUM, MG, AST, ALT, ALKPHOS, BILITOT in the last 168 hours.  Invalid input(s): GFRCGP ------------------------------------------------------------------------------------------------------------------  Cardiac Enzymes No results for input(s): TROPONINI in the last 168 hours. ------------------------------------------------------------------------------------------------------------------  RADIOLOGY:  No results found.  EKG:   Orders placed or performed during the hospital encounter of 01/29/15  . ED EKG  . ED EKG  . ED EKG  . ED EKG  . EKG 12-Lead  . EKG 12-Lead  .  EKG    IMPRESSION AND PLAN:   Recurrent C. difficile colitis Pulmonary emboli TBI  I will start vancomycin by mouth and Flagyl. Follow-up GI consult from Dr. Markham JordanElliot. Follow-up CBC and BMP. Continue eliquis for PE. Dietitian consult for tube feeding. Continue other home medication. Continue chest physical therapy.  All the records are reviewed and case discussed with ED provider. Management plans discussed with the patient's mother and they are in agreement.  CODE STATUS: Full code  TOTAL TIME TAKING CARE OF THIS PATIENT: 57 minutes.    Shaune Pollackhen, Cj Beecher M.D on 06/19/2015 at 8:02 PM  Between 7am to 6pm - Pager - [phone]  After 6pm go to [email or web address] - password [email or web address] [facility]  [facility]  Office  [phone]  CC: Primary care physician; Danella PentonMILLER,MARK F., MD

## 2015-06-19 NOTE — Progress Notes (Signed)
PHARMACIST - PHYSICIAN ORDER COMMUNICATION  CONCERNING: P&T Medication Policy on Herbal Medications  DESCRIPTION:  This patient's order for:  Cranberry caps  has been noted.  This product(s) is classified as an "herbal" or natural product. Due to a lack of definitive safety studies or FDA approval, nonstandard manufacturing practices, plus the potential risk of unknown drug-drug interactions while on inpatient medications, the Pharmacy and Therapeutics Committee does not permit the use of "herbal" or natural products of this type within .   ACTION TAKEN: The pharmacy department is unable to verify this order at this time and your patient has been informed of this safety policy. Please reevaluate patient's clinical condition at discharge and address if the herbal or natural product(s) should be resumed at that time.   

## 2015-06-19 NOTE — Progress Notes (Signed)
MEDICATION RELATED CONSULT NOTE - INITIAL   Pharmacy Consult for  Indication:   Allergies  Allergen Reactions  . Ambien [Zolpidem] Other (See Comments)    Patient start hallucinating.   . Ativan [Lorazepam] Itching and Other (See Comments)    Patient gets hot and sweaty. Hallucinations   . Depakote Er [Divalproex Sodium Er] Itching and Other (See Comments)    Patient gets hot and sweaty.  . Dilaudid [Hydromorphone] Hives and Other (See Comments)    Patient gets hot and sweaty.  Marland Kitchen. Keppra [Levetiracetam] Itching and Other (See Comments)    Patient gets hot and sweaty.    Patient Measurements:   Adjusted Body Weight:   Vital Signs: Temp: 97.5 F (36.4 C) (11/21 1854) Temp Source: Oral (11/21 1854) BP: 124/73 mmHg (11/21 1854) Pulse Rate: 85 (11/21 1854) Intake/Output from previous day:   Intake/Output from this shift:    Labs: No results for input(s): WBC, HGB, HCT, PLT, APTT, CREATININE, LABCREA, CREATININE, CREAT24HRUR, MG, PHOS, ALBUMIN, PROT, ALBUMIN, AST, ALT, ALKPHOS, BILITOT, BILIDIR, IBILI in the last 72 hours. CrCl cannot be calculated (Patient has no serum creatinine result on file.).   Microbiology: Recent Results (from the past 720 hour(s))  Culture, blood (routine x 2)     Status: None   Collection Time: 05/24/15 11:00 PM  Result Value Ref Range Status   Specimen Description BLOOD BLOOD LEFT FOREARM  Final   Special Requests BOTTLES DRAWN AEROBIC AND ANAEROBIC 10ML  Final   Culture NO GROWTH 6 DAYS  Final   Report Status 05/30/2015 FINAL  Final  Culture, blood (routine x 2)     Status: None   Collection Time: 05/24/15 11:54 PM  Result Value Ref Range Status   Specimen Description BLOOD BLOOD LEFT FOREARM  Final   Special Requests BOTTLES DRAWN AEROBIC AND ANAEROBIC 15CC  Final   Culture NO GROWTH 5 DAYS  Final   Report Status 05/30/2015 FINAL  Final  MRSA PCR Screening     Status: None   Collection Time: 05/25/15 11:59 AM  Result Value Ref Range  Status   MRSA by PCR NEGATIVE NEGATIVE Final    Comment:        The GeneXpert MRSA Assay (FDA approved for NASAL specimens only), is one component of a comprehensive MRSA colonization surveillance program. It is not intended to diagnose MRSA infection nor to guide or monitor treatment for MRSA infections.   Urine culture     Status: None   Collection Time: 05/25/15  4:14 PM  Result Value Ref Range Status   Specimen Description URINE, RANDOM  Final   Special Requests Normal  Final   Culture MULTIPLE SPECIES PRESENT, SUGGEST RECOLLECTION  Final   Report Status 05/28/2015 FINAL  Final  Culture, expectorated sputum-assessment     Status: None   Collection Time: 05/25/15  5:17 PM  Result Value Ref Range Status   Specimen Description EXPECTORATED SPUTUM  Final   Special Requests Normal  Final   Sputum evaluation THIS SPECIMEN IS ACCEPTABLE FOR SPUTUM CULTURE  Final   Report Status 05/25/2015 FINAL  Final  Culture, respiratory (NON-Expectorated)     Status: None   Collection Time: 05/25/15  5:17 PM  Result Value Ref Range Status   Specimen Description EXPECTORATED SPUTUM  Final   Special Requests Normal Reflexed from [id number]  Final   Gram Stain   Final    EXCELLENT SPECIMEN - 90-100% WBCS MANY WBC SEEN MANY GRAM POSITIVE COCCI IN PAIRS FEW GRAM  NEGATIVE RODS    Culture   Final    MODERATE GROWTH PROTEUS MIRABILIS MODERATE GROWTH PSEUDOMONAS AERUGINOSA    Report Status 05/29/2015 FINAL  Final   Organism ID, Bacteria PROTEUS MIRABILIS  Final   Organism ID, Bacteria PSEUDOMONAS AERUGINOSA  Final      Susceptibility   Proteus mirabilis - MIC*    AMPICILLIN Value in next row Sensitive      SENSITIVE<=2    CEFAZOLIN Value in next row Sensitive      SENSITIVE8    CEFTRIAXONE Value in next row Sensitive      SENSITIVE<=1    CIPROFLOXACIN Value in next row Resistant      SENSITIVE<=1    GENTAMICIN Value in next row Sensitive      SENSITIVE<=1    IMIPENEM Value in next row  Sensitive      SENSITIVE4    NITROFURANTOIN Value in next row Resistant      SENSITIVE4    TRIMETH/SULFA Value in next row Resistant      SENSITIVE4    PIP/TAZO Value in next row Sensitive      SENSITIVE<=4    * MODERATE GROWTH PROTEUS MIRABILIS   Pseudomonas aeruginosa - MIC*    CEFTAZIDIME Value in next row Sensitive      SENSITIVE<=4    CIPROFLOXACIN Value in next row Sensitive      SENSITIVE<=4    GENTAMICIN Value in next row Sensitive      SENSITIVE<=4    IMIPENEM Value in next row Sensitive      SENSITIVE<=4    PIP/TAZO Value in next row Sensitive      SENSITIVE<=4    * MODERATE GROWTH PSEUDOMONAS AERUGINOSA  Culture, blood (routine x 2)     Status: None   Collection Time: 05/25/15  6:10 PM  Result Value Ref Range Status   Specimen Description BLOOD RIGHT AC  Final   Special Requests BOTTLES DRAWN AEROBIC AND ANAEROBIC 9CC  Final   Culture NO GROWTH 5 DAYS  Final   Report Status 05/30/2015 FINAL  Final  Culture, blood (routine x 2)     Status: None   Collection Time: 05/25/15  6:15 PM  Result Value Ref Range Status   Specimen Description BLOOD RIGHT HAND  Final   Special Requests BOTTLES DRAWN AEROBIC AND ANAEROBIC  6CC  Final   Culture NO GROWTH 5 DAYS  Final   Report Status 05/30/2015 FINAL  Final    Medical History: Past Medical History  Diagnosis Date  . Hepatitis C   . Scoliosis   . TBI (traumatic brain injury) (HCC) 07/2011  . Subarachnoid hemorrhage (HCC)   . Pulmonary emboli (HCC)   . Hepatitis C   . Depression     Medications:  Prescriptions prior to admission  Medication Sig Dispense Refill Last Dose  . acidophilus (RISAQUAD) CAPS capsule Take 1 capsule by mouth 2 (two) times daily. 1 capsule 1 Taking  . albuterol (PROVENTIL) (2.5 MG/3ML) 0.083% nebulizer solution Inhale 3 mLs into the lungs 2 (two) times daily as needed.   Taking  . apixaban (ELIQUIS) 5 MG TABS tablet Take 5 mg by mouth 2 (two) times daily.   Taking  . bisacodyl (DULCOLAX) 10 MG  suppository Place 1 suppository (10 mg total) rectally daily as needed for moderate constipation. 12 suppository 0 Taking  . Cranberry 1000 MG CAPS Take 1,000 mg by mouth daily.    Taking  . gabapentin (NEURONTIN) 300 MG capsule Take 900 mg  by mouth 3 (three) times daily.    Taking  . indomethacin (INDOCIN SR) 75 MG CR capsule Take 75 mg by mouth daily.   Taking  . lacosamide (VIMPAT) 200 MG TABS tablet Take 200 mg by mouth 2 (two) times daily.   Taking  . metroNIDAZOLE (FLAGYL) 500 MG tablet Take 500 mg by mouth 3 (three) times daily.   Taking  . Multiple Vitamins-Minerals (MULTIVITAMIN WITH MINERALS) tablet Take 1 tablet by mouth daily.   Taking  . Nutritional Supplements (FEEDING SUPPLEMENT, JEVITY 1.5 CAL/FIBER,) LIQD Place 237 mLs into feeding tube 5 (five) times daily.   Taking  . nystatin (MYCOSTATIN/NYSTOP) 100000 UNIT/GM POWD Apply 1 g topically 2 (two) times daily.    Taking  . OLANZapine (ZYPREXA) 15 MG tablet Take 15 mg by mouth at bedtime.   Taking  . perphenazine (TRILAFON) 4 MG tablet Take 4 mg by mouth 2 (two) times daily.   Taking  . Respiratory Therapy Supplies (FLUTTER) DEVI Use as directed 1 each 0   . risedronate (ACTONEL) 35 MG tablet Take 35 mg by mouth every 7 (seven) days. with water on empty stomach, nothing by mouth or lie down for next 30 minutes. Take on Saturday.   Taking  . senna (SENOKOT) 8.6 MG TABS tablet Take 1 tablet (8.6 mg total) by mouth at bedtime. 120 each 0 Taking  . vancomycin (VANCOCIN) 50 mg/mL oral solution Take 5 mLs (250 mg total) by mouth every 6 (six) hours. 1 mL 1 Taking  . Water For Irrigation, Sterile (FREE WATER) SOLN Place 300 mLs into feeding tube every 6 (six) hours. 1000 mL 11 Taking    Assessment:   Goal of Therapy:    Plan:  Pt was ordered Actonel (risedronate) 35 mg PO Q 7 days at home.  Actonel d/c'd per hospital policy.   Malcolm Hetz D 06/19/2015,9:00 PM

## 2015-06-20 ENCOUNTER — Encounter: Payer: Self-pay | Admitting: *Deleted

## 2015-06-20 ENCOUNTER — Ambulatory Visit: Payer: BLUE CROSS/BLUE SHIELD

## 2015-06-20 LAB — BASIC METABOLIC PANEL
Anion gap: 7 (ref 5–15)
BUN: 12 mg/dL (ref 6–20)
CHLORIDE: 106 mmol/L (ref 101–111)
CO2: 29 mmol/L (ref 22–32)
CREATININE: 0.41 mg/dL — AB (ref 0.61–1.24)
Calcium: 9.3 mg/dL (ref 8.9–10.3)
GFR calc Af Amer: >60 mL/min (ref >60)
GLUCOSE: 94 mg/dL (ref 65–99)
Potassium: 3.9 mmol/L (ref 3.5–5.1)
SODIUM: 142 mmol/L (ref 135–145)

## 2015-06-20 LAB — CBC
HEMATOCRIT: 38.4 % — AB (ref 40.0–52.0)
Hemoglobin: 12.3 g/dL — ABNORMAL LOW (ref 13.0–18.0)
MCH: 26.2 pg (ref 26.0–34.0)
MCHC: 32.1 g/dL (ref 32.0–36.0)
MCV: 81.5 fL (ref 80.0–100.0)
PLATELETS: 209 10*3/uL (ref 150–440)
RBC: 4.71 MIL/uL (ref 4.40–5.90)
RDW: 15.5 % — AB (ref 11.5–14.5)
WBC: 5.9 10*3/uL (ref 3.8–10.6)

## 2015-06-20 MED ORDER — CETYLPYRIDINIUM CHLORIDE 0.05 % MT LIQD
7.0000 mL | Freq: Two times a day (BID) | OROMUCOSAL | Status: DC
  Administered 2015-06-20 – 2015-06-24 (×9): 7 mL via OROMUCOSAL

## 2015-06-20 MED ORDER — FREE WATER
300.0000 mL | Freq: Four times a day (QID) | Status: DC
  Administered 2015-06-20 – 2015-06-25 (×20): 300 mL

## 2015-06-20 MED ORDER — CHLORHEXIDINE GLUCONATE 0.12 % MT SOLN
15.0000 mL | Freq: Two times a day (BID) | OROMUCOSAL | Status: DC
  Administered 2015-06-20 – 2015-06-25 (×10): 15 mL via OROMUCOSAL
  Filled 2015-06-20 (×10): qty 15

## 2015-06-20 MED ORDER — FIDAXOMICIN 200 MG PO TABS
200.0000 mg | ORAL_TABLET | Freq: Two times a day (BID) | ORAL | Status: DC
Start: 2015-06-20 — End: 2015-06-25
  Administered 2015-06-20 – 2015-06-25 (×10): 200 mg via ORAL
  Filled 2015-06-20 (×10): qty 1

## 2015-06-20 NOTE — Progress Notes (Signed)
Initial Nutrition Assessment       INTERVENTION:  EN: Continue jevity 1.5, 5 cans per day bolus.  Will provide 1777 kcals, 118 g of protein and 900ml free water.  May need to add additional can if unable to take po.  Continue free water of 300ml q 6 hr per MD order Coordination of care: Alcario DroughtErica, RN to follow up with MD regarding SLP consult  NUTRITION DIAGNOSIS:   Increased nutrient needs related to acute illness, wound healing as evidenced by estimated needs.    GOAL:   Patient will meet greater than or equal to 90% of their needs    MONITOR:    (Energy intake, Electrolyte and renal profile, Digestive system)  REASON FOR ASSESSMENT:   Consult Enteral/tube feeding initiation and management  ASSESSMENT:      Pt admitted with c-diff  Past Medical History  Diagnosis Date  . Hepatitis C   . Scoliosis   . TBI (traumatic brain injury) (HCC) 07/2011  . Subarachnoid hemorrhage (HCC)   . Pulmonary emboli (HCC)   . Hepatitis C   . Depression     Current Nutrition: Mother not in room. Per RN, Alcario DroughtErica mother reported pt has not been taking much if anything by mouth for the last several weeks, not even po medications.  Everything has been given via PEG.  Has been taking jevity 1.5, 5 cans per day prior to admission  Noted last admission on dysphagia II, honey thick liquids  Food/Nutrition-Related History: see above   Scheduled Medications:  . acidophilus  1 capsule Oral BID  . albuterol  2.5 mg Nebulization TID  . apixaban  5 mg Oral BID  . feeding supplement (JEVITY 1.5 CAL/FIBER)  237 mL Per Tube 5 X Daily  . free water  300 mL Per Tube 4 times per day  . gabapentin  900 mg Oral TID  . indomethacin  75 mg Oral Daily  . lacosamide  200 mg Oral BID  . metronidazole  500 mg Intravenous Q8H  . multivitamin with minerals  1 tablet Oral Daily  . nystatin  1 g Topical BID  . OLANZapine  7.5 mg Oral QHS  . perphenazine  4 mg Oral BID  . vancomycin  250 mg Oral 4 times per  day    Continuous Medications:  . 0.9 % NaCl with KCl 20 mEq / L 100 mL/hr at 06/20/15 1116     Electrolyte/Renal Profile and Glucose Profile:   Recent Labs Lab 06/19/15 2119 06/20/15 0457  NA 141 142  K 4.3 3.9  CL 104 106  CO2 31 29  BUN 12 12  CREATININE 0.43* 0.41*  CALCIUM 9.5 9.3  MG 2.0  --   GLUCOSE 91 94   Protein Profile:  Recent Labs Lab 06/19/15 2119  ALBUMIN 3.9    Gastrointestinal Profile: Last BM:11/22    Weight Change: noted wt gain prior to admission    Diet Order:   NPO  Skin:   pressure ulcer stage II   Height:   Ht Readings from Last 1 Encounters:  06/06/15 6' (1.829 m)    Weight:   Wt Readings from Last 1 Encounters:  06/06/15 200 lb (90.719 kg)    Ideal Body Weight:     BMI:  There is no weight on file to calculate BMI.  Estimated Nutritional Needs:   Kcal:  BEE 1913 kcals (IF 1.0-1.2, AF 1.2) [id number] kcals/d.   Protein:  (1.2-1.5 g/kg) 108-135 g/d  Fluid:  (30-7435ml/kg)  2700-3135ml/d  EDUCATION NEEDS:   No education needs identified at this time  MODERATE Care Level   Jaimya Feliciano B. Freida Busman, RD, LDN [phone] (pager)

## 2015-06-20 NOTE — Consult Note (Signed)
GI Inpatient Consult Note  Reason for Consult: C diff colitis   Attending Requesting Consult: Guru  History of Present Illness: [name] [name] is a 29 y.o. male with a history of recurrent C diff colitis, TBI, Hep C, and pulmonary emboli admitted for persistent C diff.  The history is provided by [name] [name]'s mother due to patient's TBI.  She reports he was initially diagnosed with C diff by PCP on 05/16/15 and prescribed a 7-day course of Flagyl.  Prior to diarrhea beginning, he was completed a course of Augmentin.  He continued to have low-grade fevers and diarrhea, so he was hospitalized on 05/26/15. He was started on Vancomycin in addition to Flagyl, and discharged on oral vancomycin x 14 days.  Since discharge, diarrhea, low-grade fevers, and general malaise have persisted without any improvement. These symptoms worsening significantly over the last 5 days, so she brought him to the ED for further evaluation.  Patient's mother also notes this is his third episode of C diff since his TBI in 2013.  He initially contracted C diff while hospitalized in 2013, and after two rounds of abx without much improvement he underwent an ileostomy.  This was reversed 1.5 years ago after C diff did not recur.  After the reversal, patient has had trouble with constipation which is relieved only by "magic bullets".  However, she denies any episodes of constipation in the last 10 weeks (since symptom onset).  Patient's mother also notes she is a Crohn's disease patient and has had C diff herself while her son has been ill.    Significant labs included Hgb 12.3 today (13.3 on admission), Alt 71; CBC and CMP otherwise WNL.  He is currently receiving IV Flagyl  q 8 hrs and po Vancomycin  q 4 hrs.  He is also still receiving po Eliquis for due to history of PEs.  Past Medical History:  Past Medical History  Diagnosis Date  . Hepatitis C   . Scoliosis   . TBI (traumatic brain injury) (HCC) 07/2011  .  Subarachnoid hemorrhage (HCC)   . Pulmonary emboli (HCC)   . Hepatitis C   . Depression     Problem List: Patient Active Problem List   Diagnosis Date Noted  . Colitis, Clostridium difficile 06/19/2015  . Hypoxia 05/25/2015  . Clostridium difficile infection 05/25/2015  . Mucus plugging of bronchi 05/25/2015  . Low grade fever 05/25/2015  . History of pulmonary embolism 05/25/2015  . OSA on CPAP 05/25/2015  . Pressure ulcer 05/25/2015  . Convulsions/seizures (HCC) 02/10/2015  . Aspiration pneumonia (HCC) 02/07/2015  . Encephalopathy, metabolic 02/07/2015  . Acute respiratory failure (HCC) 01/29/2015  . Skin irritation 11/09/2014  . TBI (traumatic brain injury) (HCC) 02/04/2012  . 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 Fracture surgery      left acetabulum  . Ileostomy    . Lung tube    . Ankle surgery Bilateral   . Tracheostomy    . Peg tube placement    . [place]ay gastrostomy  07/02/2014  . Ivc filter placement ([facility] hx)    . Brain surgery      Allergies: Allergies  Allergen Reactions  . Ambien [Zolpidem] Other (See Comments)    Patient start hallucinating.   . Ativan [Lorazepam] Itching and Other (See Comments)    Patient gets hot and sweaty. Hallucinations   . Depakote Er [Divalproex Sodium Er]  Itching and Other (See Comments)    Patient gets hot and sweaty.  . Dilaudid [Hydromorphone] Hives and Other (See Comments)    Patient gets hot and sweaty.  Marland Kitchen Keppra [Levetiracetam] Itching and Other (See Comments)    Patient gets hot and sweaty.    Home Medications: Prescriptions prior to admission  Medication Sig Dispense Refill Last Dose  . acidophilus (RISAQUAD) CAPS capsule Take 1 capsule by mouth 2 (two) times daily. 1 capsule 1 Taking  . albuterol (PROVENTIL) (2.5 MG/3ML) 0.083% nebulizer solution Inhale 3 mLs into the lungs 2 (two) times daily  as needed.   Taking  . apixaban (ELIQUIS) 5 MG TABS tablet Take 5 mg by mouth 2 (two) times daily.   Taking  . bisacodyl (DULCOLAX) 10 MG suppository Place 1 suppository (10 mg total) rectally daily as needed for moderate constipation. 12 suppository 0 Taking  . gabapentin (NEURONTIN) 300 MG capsule Take 900 mg by mouth 3 (three) times daily.    Taking  . indomethacin (INDOCIN SR) 75 MG CR capsule Take 75 mg by mouth daily.   Taking  . lacosamide (VIMPAT) 200 MG TABS tablet Take 200 mg by mouth 2 (two) times daily.   Taking  . metroNIDAZOLE (FLAGYL) 500 MG tablet Take 500 mg by mouth 3 (three) times daily.   Taking  . Multiple Vitamins-Minerals (MULTIVITAMIN WITH MINERALS) tablet Take 1 tablet by mouth daily.   Taking  . Nutritional Supplements (FEEDING SUPPLEMENT, JEVITY 1.5 CAL/FIBER,) LIQD Place 237 mLs into feeding tube 5 (five) times daily.   Taking  . nystatin (MYCOSTATIN/NYSTOP) 100000 UNIT/GM POWD Apply 1 g topically 2 (two) times daily.    Taking  . OLANZapine (ZYPREXA) 15 MG tablet Take 7.5 mg by mouth at bedtime.    Taking  . perphenazine (TRILAFON) 4 MG tablet Take 4 mg by mouth 2 (two) times daily.   Taking  . Respiratory Therapy Supplies (FLUTTER) DEVI Use as directed 1 each 0   . risedronate (ACTONEL) 35 MG tablet Take 35 mg by mouth every 7 (seven) days. with water on empty stomach, nothing by mouth or lie down for next 30 minutes. Take on Saturday.   Taking  . senna (SENOKOT) 8.6 MG TABS tablet Take 1 tablet (8.6 mg total) by mouth at bedtime. 120 each 0 Taking  . vancomycin (VANCOCIN) 50 mg/mL oral solution Take 5 mLs (250 mg total) by mouth every 6 (six) hours. 1 mL 1 Taking  . Water For Irrigation, Sterile (FREE WATER) SOLN Place 300 mLs into feeding tube every 6 (six) hours. 1000 mL 11 Taking  . Cranberry 1000 MG CAPS Take 1,000 mg by mouth daily.    Taking   Home medication reconciliation was completed with the patient.   Scheduled Inpatient Medications:   . acidophilus   1 capsule Oral BID  . albuterol  2.5 mg Nebulization TID  . apixaban  5 mg Oral BID  . feeding supplement (JEVITY 1.5 CAL/FIBER)  237 mL Per Tube 5 X Daily  . free water  300 mL Per Tube 4 times per day  . gabapentin  900 mg Oral TID  . indomethacin  75 mg Oral Daily  . lacosamide  200 mg Oral BID  . metronidazole  500 mg Intravenous Q8H  . multivitamin with minerals  1 tablet Oral Daily  . nystatin  1 g Topical BID  . OLANZapine  7.5 mg Oral QHS  . perphenazine  4 mg Oral BID  . vancomycin  250  mg Oral 4 times per day    Continuous Inpatient Infusions:   . 0.9 % NaCl with KCl 20 mEq / L 100 mL/hr at 06/20/15 1116    PRN Inpatient Medications:  acetaminophen **OR** acetaminophen, albuterol, ondansetron **OR** ondansetron (ZOFRAN) IV  Family History: family history includes Autoimmune disease in his mother; Cancer in his maternal grandmother; Heart disease in his maternal grandfather; Hyperlipidemia in his father; Hypertension in an other family member.    Social History:   reports that he quit smoking about 3 years ago. He has never used smokeless tobacco. He reports that he does not drink alcohol or use illicit drugs.   Review of Systems: Constitutional: Weight is stable.  Eyes: No changes in vision. ENT: No oral lesions, sore throat.  GI: see HPI.  Heme/Lymph: No easy bruising.  CV: No chest pain.  GU: No hematuria.  Integumentary: No rashes.  Neuro: No headaches.  Psych: No depression/anxiety.  Endocrine: No heat/cold intolerance.  Allergic/Immunologic: No urticaria.  Resp: No cough, SOB.  Musculoskeletal: No joint swelling.    Physical Examination: BP 113/57 mmHg  Pulse 67  Temp(Src) 98.3 F (36.8 C) (Oral)  Resp 20  SpO2 97% Gen: NAD, alert and oriented x 4 HEENT: PEERLA, EOMI, Neck: supple, no JVD or thyromegaly Chest: CTA bilaterally, no wheezes, crackles, or other adventitious sounds CV: RRR, no m/g/c/r Abd: soft, NT, ND, +BS in all four quadrants; no  HSM, guarding, ridigity, or rebound tenderness Ext: no edema, well perfused with 2+ pulses, Skin: no rash or lesions noted Lymph: no LAD  Data: Lab Results  Component Value Date   WBC 5.9 06/20/2015   HGB 12.3* 06/20/2015   HCT 38.4* 06/20/2015   MCV 81.5 06/20/2015   PLT 209 06/20/2015    Recent Labs Lab 06/19/15 2119 06/20/15 0457  HGB 13.3 12.3*   Lab Results  Component Value Date   NA 142 06/20/2015   K 3.9 06/20/2015   CL 106 06/20/2015   CO2 29 06/20/2015   BUN 12 06/20/2015   CREATININE 0.41* 06/20/2015   Lab Results  Component Value Date   ALT 71* 06/19/2015   AST 34 06/19/2015   ALKPHOS 67 06/19/2015   BILITOT 0.5 06/19/2015   No results for input(s): APTT, INR, PTT in the last 168 hours. Assessment/Plan: [name] is a 29 y.o. male with a history of recurrent C diff colitis, TBI, Hep C, and pulmonary emboli admitted for persistent C diff. This most recent infection has been ongoing for 10 weeks without any symptomatic improvement after 10 days of Flagyl as outpatient, Flagyl and Vanc as inpatient, and 14 days of Vanco upon discharge.  It is notable that he had two previous episodes of C diff in 2013, seemily resolved after ileostomy which was reversed 1.5 years ago.  Hgb is slightly decreased but stable, as well as WBCs and vitals. Given his lack of improvement following courses of Flagyl and Vanc, I recommend starting Deficid 200mg  BID x 10 days followed by a stool transplant.  Patient will need to be off Deficid for two days prior to procedure.  Will continue to follow labs and symptomatic progress.  Recommendations: - Start Deficid 200mg  BID x 10 days - Consider stool transplant about two days after course of Deficid completed  Thank you for the consult. We will follow along with you. Please call with questions or concerns.  Burman FreestoneMichelle C Johnson, PA-C [facility] Phone: [phone] Pager: [phone]

## 2015-06-20 NOTE — Progress Notes (Signed)
Patient alert and talking some but not able to understand.  Patient slept most of the morning.  VSS.  No signs of pain.  Tolerating his tube feedings well.  Condom catheter in place with adequate output.  No significant changes.

## 2015-06-20 NOTE — Progress Notes (Signed)
[facility] [place] at [facility]   PATIENT NAME: [name] [name] [name]    MR#:  [id number]  DATE OF BIRTH:  01/26/1986  SUBJECTIVE:  CHIEF COMPLAINT:  No chief complaint on file.  No complaints states that he feels well  REVIEW OF SYSTEMS:   ROS unable to obtain  DRUG ALLERGIES:   Allergies  Allergen Reactions  . Ambien [Zolpidem] Other (See Comments)    Patient start hallucinating.   . Ativan [Lorazepam] Itching and Other (See Comments)    Patient gets hot and sweaty. Hallucinations   . Depakote Er [Divalproex Sodium Er] Itching and Other (See Comments)    Patient gets hot and sweaty.  . Dilaudid [Hydromorphone] Hives and Other (See Comments)    Patient gets hot and sweaty.  Marland Kitchen. Keppra [Levetiracetam] Itching and Other (See Comments)    Patient gets hot and sweaty.    VITALS:  Blood pressure 105/62, pulse 78, temperature 98.6 F (37 C), temperature source Oral, resp. rate 18, height 6' (1.829 m), weight 95.528 kg (210 lb 9.6 oz), SpO2 96 %.  PHYSICAL EXAMINATION:  GENERAL: 29 y.o.-year-old patient lying in the bed with no acute distress.  EYES: Pupils equal, round, reactive to light and accommodation. No scleral icterus. Extraocular muscles intact.  HEENT: Head atraumatic, normocephalic. Oropharynx and nasopharynx clear.  NECK: Supple, no jugular venous distention. No thyroid enlargement, no tenderness.  LUNGS: Normal breath sounds bilaterally, no wheezing, rales,rhonchi or crepitation. No use of accessory muscles of respiration.  CARDIOVASCULAR: S1, S2 normal. No murmurs, rubs, or gallops.  ABDOMEN: Soft, nontender, nondistended. Bowel sounds present. No organomegaly or mass. PEG tube in situ. No guarding no rebound EXTREMITIES: No pedal edema, cyanosis, or clubbing. He does have one stage I pressure ulcer on the bottom of the left foot and one on the left heel NEUROLOGIC: Mumbling, does not follow commands. PSYCHIATRIC: The patient is alert,  awake but does not follow command. SKIN: No obvious rash, lesion, or ulcer.   LABORATORY PANEL:   CBC  Recent Labs Lab 06/20/15 0457  WBC 5.9  HGB 12.3*  HCT 38.4*  PLT 209   ------------------------------------------------------------------------------------------------------------------  Chemistries   Recent Labs Lab 06/19/15 2119 06/20/15 0457  NA 141 142  K 4.3 3.9  CL 104 106  CO2 31 29  GLUCOSE 91 94  BUN 12 12  CREATININE 0.43* 0.41*  CALCIUM 9.5 9.3  MG 2.0  --   AST 34  --   ALT 71*  --   ALKPHOS 67  --   BILITOT 0.5  --    ------------------------------------------------------------------------------------------------------------------  Cardiac Enzymes No results for input(s): TROPONINI in the last 168 hours. ------------------------------------------------------------------------------------------------------------------  RADIOLOGY:  No results found.  EKG:   Orders placed or performed during the hospital encounter of 01/29/15  . ED EKG  . ED EKG  . ED EKG  . ED EKG  . EKG 12-Lead  . EKG 12-Lead  . EKG    ASSESSMENT AND PLAN:   #1 recurrent Clostridium difficile colitis: Appreciate gastroenterology consultation. Starting deficit 200 mg twice a day 10 days. Considering stool transplant after course complete.   #2 history of pulmonary emboli: Continue elequis  #3 history of traumatic brain injury: His sitter is here who knows him well. She states that he is almost back to his mental status baseline.     All the records are reviewed and case discussed with Care Management/Social Workerr. Management plans discussed with the patient, family and they are in  agreement.  CODE STATUS: Full  TOTAL TIME TAKING CARE OF THIS PATIENT: 25 minutes.  Greater than 50% of time spent in care coordination and counseling. POSSIBLE D/C IN 1-2 DAYS, DEPENDING ON CLINICAL CONDITION.   Elby Showers M.D on 06/20/2015 at 5:33 PM  Between 7am to 6pm  - Pager - [phone]  After 6pm go to [email or web address] - password [email or web address] [facility]  [place] [facility]  Office  [phone]  CC: Primary care physician; [name]., MD

## 2015-06-21 ENCOUNTER — Inpatient Hospital Stay: Payer: BLUE CROSS/BLUE SHIELD

## 2015-06-21 LAB — CBC
HEMATOCRIT: 38.3 % — AB (ref 40.0–52.0)
HEMOGLOBIN: 12.1 g/dL — AB (ref 13.0–18.0)
MCH: 26.2 pg (ref 26.0–34.0)
MCHC: 31.7 g/dL — AB (ref 32.0–36.0)
MCV: 82.6 fL (ref 80.0–100.0)
Platelets: 182 10*3/uL (ref 150–440)
RBC: 4.64 MIL/uL (ref 4.40–5.90)
RDW: 15.3 % — ABNORMAL HIGH (ref 11.5–14.5)
WBC: 4.5 10*3/uL (ref 3.8–10.6)

## 2015-06-21 LAB — BASIC METABOLIC PANEL
Anion gap: 6 (ref 5–15)
BUN: 10 mg/dL (ref 6–20)
CHLORIDE: 110 mmol/L (ref 101–111)
CO2: 27 mmol/L (ref 22–32)
CREATININE: 0.38 mg/dL — AB (ref 0.61–1.24)
Calcium: 8.9 mg/dL (ref 8.9–10.3)
GFR calc Af Amer: >60 mL/min (ref >60)
GFR calc non Af Amer: >60 mL/min (ref >60)
Glucose, Bld: 96 mg/dL (ref 65–99)
POTASSIUM: 3.9 mmol/L (ref 3.5–5.1)
Sodium: 143 mmol/L (ref 135–145)

## 2015-06-21 MED ORDER — JEVITY 1.5 CAL/FIBER PO LIQD
237.0000 mL | Freq: Every day | ORAL | Status: DC
  Administered 2015-06-21 – 2015-06-25 (×19): 237 mL

## 2015-06-21 NOTE — Progress Notes (Signed)
OT Cancellation Note  Patient Details Name: [name] [name] MRN: [id number] DOB: 02/21/1986   Cancelled Treatment:    Reason Eval/Treat Not Completed: Other (comment). Occupational Therapy evaluation attempted, patient with Physical Therapy. Will re-attempt time permitting.  Ocie CornfieldHuff, Rayni Nemitz M 06/21/2015, 4:09 PM

## 2015-06-21 NOTE — Progress Notes (Signed)
Speech Therapy Note: upon chart review, noted pt's rec'd dysphagia 3 diet w/ Honey consistency liquids was cancelled in chart; MBSS was performed this AM per Mother's request to MD yesterday. Upon further investigation w/ Dietician and MD, pt's Mother ordered for the po diet to be cancelled stating she "just wanted him to have TFs". Due to Mother's request, no further skilled ST services indicated at this time sec. to pt not taking an oral diet(no further need for education w/ Mother). In addition, pt is at his baseline w/ his oral diet(toleration) of a Dys. 3 w/ Honey liquids. Rec. Aspiration precautions and Reflux precautions w/ pt sec. to TFs, and if taking any po's of the rec'd dysphagia diet. MD agreed. ST signed off.

## 2015-06-21 NOTE — Care Management (Signed)
Spoke with mother of the patient. She expressed interest in transferring [facility]. Explained that there must be a medical need and that I would speak with attending physician ( Dr Sherryll BurgerShah) about her concerns. She stated that patient PCP was considering Stool transplant for re current C-Diff.  Patient is well known to this NCM. Has multiple resources  In the home as well as in home care givers. More to follow.
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
## 2015-06-21 NOTE — Progress Notes (Signed)
Called by Rn to check pt home Bipap unit. Pt is very agitated and cursing. Pt mask was leaking. Tightened Head gear and is no longer leaking. HR 97, RR 22, SPO2 94%. Informed RN Leavy CellaJasmine of findings.
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
## 2015-06-21 NOTE — Care Management (Signed)
Patient mother contacted patient nurse Adelina MingsKim Bundren RN and demanded that case management submit request for [facility]. I was informed by the nurse of this.  Discussed this with Dr Sherryll BurgerShah who stated to go ahead and send request and notes.  Attempted to contact Waynette ButteryGina Ascyue at patient intake [facility] to notify. Left voicemail and contact information.   Patient will need PT and OT evaluations prior to faxing request. Orders placed in Epic by Dr Sherryll BurgerShah. Awaitng call back from MS Ascyue.

## 2015-06-21 NOTE — Progress Notes (Signed)
[facility] - [place] [facility]   PATIENT NAME: [name] [name]    MR#:  [id number]  DATE OF BIRTH:  15-Jul-1986  SUBJECTIVE:  CHIEF COMPLAINT:  No chief complaint on file.  Caregiver at bedside, feels about same, Talked with Mother on Phone who seemed very frustrated but regardless she has been very inappropriate and verbally abusive for things beyond our control (wanting to place at [facility], getting Stool Transplant). Also refusing ST requested dietary changes (and wants him to be on TF only and wants outpt ST eval) REVIEW OF SYSTEMS:   ROS unable to obtain  DRUG ALLERGIES:   Allergies  Allergen Reactions  . Ambien [Zolpidem] Other (See Comments)    Patient start hallucinating.   . Ativan [Lorazepam] Itching and Other (See Comments)    Patient gets hot and sweaty. Hallucinations   . Depakote Er [Divalproex Sodium Er] Itching and Other (See Comments)    Patient gets hot and sweaty.  . Dilaudid [Hydromorphone] Hives and Other (See Comments)    Patient gets hot and sweaty.  Marland Kitchen Keppra [Levetiracetam] Itching and Other (See Comments)    Patient gets hot and sweaty.    VITALS:  Blood pressure 120/56, pulse 76, temperature 98.4 F (36.9 C), temperature source Axillary, resp. rate 18, height 6' (1.829 m), weight 95.528 kg (210 lb 9.6 oz), SpO2 98 %. PHYSICAL EXAMINATION:  GENERAL: 29 y.o.-year-old patient lying in the bed with no acute distress.  EYES: Pupils equal, round, reactive to light and accommodation. No scleral icterus. Extraocular muscles intact.  HEENT: Head atraumatic, normocephalic. Oropharynx and nasopharynx clear.  NECK: Supple, no jugular venous distention. No thyroid enlargement, no tenderness.  LUNGS: Normal breath sounds bilaterally, no wheezing, rales,rhonchi or crepitation. No use of accessory muscles of respiration.  CARDIOVASCULAR: S1, S2 normal. No murmurs, rubs, or gallops.  ABDOMEN: Soft, nontender, nondistended.  Bowel sounds present. No organomegaly or mass. PEG tube in situ. No guarding no rebound EXTREMITIES: No pedal edema, cyanosis, or clubbing. He does have one stage I pressure ulcer on the bottom of the left foot and one on the left heel NEUROLOGIC: Mumbling, does not follow commands. PSYCHIATRIC: The patient is alert, awake but does not follow command. SKIN: No obvious rash, lesion, or ulcer.  LABORATORY PANEL:   CBC  Recent Labs Lab 06/21/15 0554  WBC 4.5  HGB 12.1*  HCT 38.3*  PLT 182   ------------------------------------------------------------------------------------------------------------------  Chemistries   Recent Labs Lab 06/19/15 2119  06/21/15 0554  NA 141  < > 143  K 4.3  < > 3.9  CL 104  < > 110  CO2 31  < > 27  GLUCOSE 91  < > 96  BUN 12  < > 10  CREATININE 0.43*  < > 0.38*  CALCIUM 9.5  < > 8.9  MG 2.0  --   --   AST 34  --   --   ALT 71*  --   --   ALKPHOS 67  --   --   BILITOT 0.5  --   --   < > = values in this interval not displayed. ------------------------------------------------------------------------------------------------------------------  Cardiac Enzymes No results for input(s): TROPONINI in the last 168 hours. ------------------------------------------------------------------------------------------------------------------  RADIOLOGY:  No results found.  EKG:   Orders placed or performed during the hospital encounter of 01/29/15  . ED EKG  . ED EKG  . ED EKG  . ED EKG  . EKG 12-Lead  . EKG 12-Lead  .  EKG    ASSESSMENT AND PLAN:   #1 recurrent Clostridium difficile colitis: Appreciate gastroenterology consultation. Continue deficid 200 mg twice a day 10 days. Considering stool transplant 2 days after course complete.  Discussed with Dr. Mechele [name] and Dr. Shelle [name]. This is a Refractory case of c.diff to PO vanc and flagyl.  #2 history of pulmonary emboli: Continue elequis  #3 history of traumatic brain injury: His sitter is here  who knows him well. She states that he is almost back to his mental status baseline but Mother has been very frustrated and upset.  His mother wants [facility] (which to my understanding is not practically possible but will try) and very inappropriate and verbally abusive for things beyond our control. She also demands stool transplant on this admission which I have informed her is beyond my control as per GI it wont' be done until he finishes course of deficid which means likely stool transplant on Dec 2nd (d/w Dr [name] [name] And Dr [name] [name])     All the records are reviewed and case discussed with Care Management/Social Workerr. Management plans discussed with the patient, family and they are in agreement.  CODE STATUS: Full  TOTAL TIME TAKING CARE OF THIS PATIENT: 45 minutes.   Greater than 50% of time spent in care coordination and counseling. POSSIBLE D/C IN ?, DEPENDING ON CLINICAL CONDITION and family decision and demands and GI plans.   [facility], [name] [name] M.D on 06/21/2015 at 4:24 PM  Between 7am to 6pm - Pager - [phone]  After 6pm go to [email or web address] - password [email or web address]  [place] [place] [facility]  Office  [phone]  CC: Primary care physician; Danella PentonMILLER,[name] F., MD

## 2015-06-22 NOTE — Progress Notes (Signed)
Family member stated patient had CPT done around 1830 today. Patients family member states that he had the treatment already and does not need it at this time.

## 2015-06-22 NOTE — Progress Notes (Signed)
[facility] - [place] [facility]   PATIENT NAME: [name] [name] [name]    MR#:  [id number]  DATE OF BIRTH:  08/14/1985  SUBJECTIVE:  CHIEF COMPLAINT:  No chief complaint on file.  Caregiver at bedside, no new issues. Talked with mom on phone and she wants to keep him here till next friday  REVIEW OF SYSTEMS:   ROS unable to obtain  DRUG ALLERGIES:   Allergies  Allergen Reactions  . Ambien [Zolpidem] Other (See Comments)    Patient start hallucinating.   . Ativan [Lorazepam] Itching and Other (See Comments)    Patient gets hot and sweaty. Hallucinations   . Depakote Er [Divalproex Sodium Er] Itching and Other (See Comments)    Patient gets hot and sweaty.  . Dilaudid [Hydromorphone] Hives and Other (See Comments)    Patient gets hot and sweaty.  Marland Kitchen. Keppra [Levetiracetam] Itching and Other (See Comments)    Patient gets hot and sweaty.    VITALS:  Blood pressure 99/40, pulse 86, temperature 98 F (36.7 C), temperature source Axillary, resp. rate 18, height 6' (1.829 m), weight 95.528 kg (210 lb 9.6 oz), SpO2 92 %. PHYSICAL EXAMINATION:  GENERAL: 29 y.o.-year-old patient lying in the bed with no acute distress.  EYES: Pupils equal, round, reactive to light and accommodation. No scleral icterus. Extraocular muscles intact.  HEENT: Head atraumatic, normocephalic. Oropharynx and nasopharynx clear.  NECK: Supple, no jugular venous distention. No thyroid enlargement, no tenderness.  LUNGS: Normal breath sounds bilaterally, no wheezing, rales,rhonchi or crepitation. No use of accessory muscles of respiration.  CARDIOVASCULAR: S1, S2 normal. No murmurs, rubs, or gallops.  ABDOMEN: Soft, nontender, nondistended. Bowel sounds present. No organomegaly or mass. PEG tube in situ. No guarding no rebound EXTREMITIES: No pedal edema, cyanosis, or clubbing. He does have one stage I pressure ulcer on the bottom of the left foot and one on the left heel NEUROLOGIC:  Mumbling, does not follow commands. PSYCHIATRIC: The patient is alert, awake but does not follow command. SKIN: No obvious rash, lesion, or ulcer.  LABORATORY PANEL:   CBC  Recent Labs Lab 06/21/15 0554  WBC 4.5  HGB 12.1*  HCT 38.3*  PLT 182   ------------------------------------------------------------------------------------------------------------------  Chemistries   Recent Labs Lab 06/19/15 2119  06/21/15 0554  NA 141  < > 143  K 4.3  < > 3.9  CL 104  < > 110  CO2 31  < > 27  GLUCOSE 91  < > 96  BUN 12  < > 10  CREATININE 0.43*  < > 0.38*  CALCIUM 9.5  < > 8.9  MG 2.0  --   --   AST 34  --   --   ALT 71*  --   --   ALKPHOS 67  --   --   BILITOT 0.5  --   --   < > = values in this interval not displayed. ------------------------------------------------------------------------------------------------------------------  Cardiac Enzymes No results for input(s): TROPONINI in the last 168 hours. ------------------------------------------------------------------------------------------------------------------  RADIOLOGY:  No results found.  EKG:   Orders placed or performed during the hospital encounter of 01/29/15  . ED EKG  . ED EKG  . ED EKG  . ED EKG  . EKG 12-Lead  . EKG 12-Lead  . EKG    ASSESSMENT AND PLAN:   #1 recurrent Clostridium difficile colitis: Appreciate gastroenterology consultation. Continue deficid 200 mg twice a day 10 days. Considering stool transplant 2 days after course complete.  Discussed with Dr. Mechele Collin and Dr. Shelle Iron. This is a Refractory case of c.diff to PO vanc and flagyl. Will start prep next Wednesday with plan for stool transplant next Friday  #2 history of pulmonary emboli: Continue elequis  #3 history of traumatic brain injury: His sitter is here who knows him well. She states that he is almost back to his mental status baseline but Mother has been very frustrated and upset.  His mother wants him transferred to [facility] (which to my understanding is not practically possible but will try) and very inappropriate and verbally abusive for things beyond our control. She also demands stool transplant on this admission which I have informed her is beyond my control as per GI it wont' be done until he finishes course of deficid which means likely stool transplant on Dec 2nd (d/w Dr Markham Jordan And Dr Shelle Iron)     All the records are reviewed and case discussed with Care Management/Social Workerr. Management plans discussed with the patient, family and they are in agreement.  CODE STATUS: Full  TOTAL TIME TAKING CARE OF THIS PATIENT: 25 minutes.   Greater than 50% of time spent in care coordination and counseling. POSSIBLE D/C IN ?, DEPENDING ON CLINICAL CONDITION and family decision and demands and GI plans.   [facility], Alany Borman M.D on 06/22/2015 at 12:30 PM  Between 7am to 6pm - Pager - [phone]  After 6pm go to [email or web address] - password [email or web address] [facility]  [place] [facility]  Office  [phone]  CC: Primary care physician; [name]., MD

## 2015-06-22 NOTE — Progress Notes (Signed)
Patient Alert./Orient to name/place. Educated on Day. Patient is able to answer simple questions. Tolerated tube feeds well. Patient with BIPAP only taken it off once during the night. Patient was turned every 2 hours. Skins WNL. Father called twice during shift. Father had concerns about ABT and stool transplant. Informed Father as per MD notes "patient will be on ABT regimen for 10 days, MDs will collaborate on Stool transplant." Father also wanted to know will patient be discharge and continue ABT tx at home. Parents will arrive at hospital this am for updates. Patient slept well. Skin around the Peg noted some redness and with white thick substance, appears no drainage. Family Sitter stated that was barrier cream they use on patient daily.  Staff will continue to meet needs, educated and monitor Father noted all concerns were address.

## 2015-06-23 LAB — C DIFFICILE QUICK SCREEN W PCR REFLEX
C Diff antigen: NEGATIVE
C Diff interpretation: NEGATIVE
C Diff toxin: NEGATIVE

## 2015-06-23 MED ORDER — ALBUTEROL SULFATE (2.5 MG/3ML) 0.083% IN NEBU
2.5000 mg | INHALATION_SOLUTION | Freq: Four times a day (QID) | RESPIRATORY_TRACT | Status: DC | PRN
Start: 2015-06-23 — End: 2015-06-25
  Administered 2015-06-23 – 2015-06-24 (×3): 2.5 mg via RESPIRATORY_TRACT
  Filled 2015-06-23 (×3): qty 3

## 2015-06-23 NOTE — Progress Notes (Signed)
Residual of 20ml

## 2015-06-23 NOTE — Progress Notes (Signed)
Gastric residual of 0ml

## 2015-06-23 NOTE — Care Management (Addendum)
Spoke with Standley DakinsGina Ayscue [phone] at [facility]. Faxed Referral with patient information for MD [phone].  [facility] decision [facility]. More to follow.

## 2015-06-23 NOTE — Progress Notes (Signed)
Nutrition Follow-up    INTERVENTION:   EN: recommend continuing current TF regimen of 5 cans of Jevity 1.5 as this was pt's tube feeding regimen at home prior to admission and weight trend appears up as per weight encounters. Continue to assess   NUTRITION DIAGNOSIS:   Increased nutrient needs related to acute illness, wound healing as evidenced by estimated needs.  GOAL:   Patient will meet greater than or equal to 90% of their needs  MONITOR:    (Energy intake, Electrolyte and renal profile, Digestive system)  REASON FOR ASSESSMENT:   Consult Enteral/tube feeding initiation and management  ASSESSMENT:    Pt with recurrent Cdiff colitis with plan for stool transplant next Friday. Noted SLP evaluated pt this week and planned to order diet but pt's mother did not want pt to have po diet, mother wanted pt to only have TF at present  EN: pt tolerating Jevity 1.5 bolus feedings as per order  Digestive System: minimal residuals     Electrolyte and Renal Profile:  Recent Labs Lab 06/19/15 2119 06/20/15 0457 06/21/15 0554  BUN 12 12 10   CREATININE 0.43* 0.41* 0.38*  NA 141 142 143  K 4.3 3.9 3.9  MG 2.0  --   --    Glucose Profile: No results for input(s): GLUCAP in the last 72 hours.   Meds: MVI, aciodphilus, NS with KCL at 100 ml/hr  Height:   Ht Readings from Last 1 Encounters:  06/20/15 6' (1.829 m)    Weight:   Wt Readings from Last 1 Encounters:  06/20/15 210 lb 9.6 oz (95.528 kg)    Filed Weights   06/20/15 1505  Weight: 210 lb 9.6 oz (95.528 kg)   Wt Readings from Last 10 Encounters:  06/20/15 210 lb 9.6 oz (95.528 kg)  06/06/15 200 lb (90.719 kg)  05/26/15 199 lb 3.2 oz (90.357 kg)  04/12/15 195 lb (88.451 kg)  02/14/15 194 lb 11.2 oz (88.315 kg)  11/09/14 190 lb (86.183 kg)  02/04/12 165 lb (74.844 kg)  05/23/11 168 lb (76.204 kg)    BMI:  Body mass index is 28.56 kg/(m^2).  Estimated Nutritional Needs:   Kcal:  BEE 1913 kcals (IF  1.0-1.2, AF 1.2) [id number] kcals/d.   Protein:  (1.2-1.5 g/kg) 108-135 g/d  Fluid:  (30-7635ml/kg) 2700-318150ml/d  EDUCATION NEEDS:   No education needs identified at this time  MODERATE Care Level  Romelle Starcherate Edan Juday MS, RD, LDN [phone] Pager

## 2015-06-23 NOTE — Progress Notes (Signed)
RN, Abbie in to help RT roll patient to place him on chest vest.  Patient tolerated well and chest vest started at 0822.  RT back to room at 0850 to remove chest vest from patient with the help of Abbie, RN. Patient tolerated chest vest well for 30 minute session.

## 2015-06-23 NOTE — Progress Notes (Signed)
Residual of 0ml prior to feeding 

## 2015-06-23 NOTE — Progress Notes (Signed)
Residual of 0ml prior to feeding

## 2015-06-23 NOTE — Progress Notes (Signed)
Assisted sitter with placing patient on home chest vest. Patient in no distress. Continues to talk non stop. bbs clear. Vitals normal. Tolerated well

## 2015-06-23 NOTE — Progress Notes (Signed)
Mother at bedside. Mother stated would put patient on bipap tonight. rn at bedside administering tube feeding.

## 2015-06-23 NOTE — Progress Notes (Signed)
GI Inpatient Follow-up Note  Patient Identification: [name] [name] is a 29 y.o. male with TBI , refractory c.diff  Subjective:  Only one stool since 7 am, slightly more formed.  He is more confused today than normal per care-giver in room.  Cannot obtain ROS.   Scheduled Inpatient Medications:  . acidophilus  1 capsule Oral BID  . antiseptic oral rinse  7 mL Mouth Rinse q12n4p  . apixaban  5 mg Oral BID  . chlorhexidine  15 mL Mouth Rinse BID  . feeding supplement (JEVITY 1.5 CAL/FIBER)  237 mL Per Tube 5 X Daily  . fidaxomicin  200 mg Oral BID  . free water  300 mL Per Tube 4 times per day  . gabapentin  900 mg Oral TID  . indomethacin  75 mg Oral Daily  . lacosamide  200 mg Oral BID  . multivitamin with minerals  1 tablet Oral Daily  . nystatin  1 g Topical BID  . OLANZapine  7.5 mg Oral QHS  . perphenazine  4 mg Oral BID    Continuous Inpatient Infusions:   . 0.9 % NaCl with KCl 20 mEq / L 100 mL/hr at 06/23/15 1918    PRN Inpatient Medications:  acetaminophen **OR** acetaminophen, albuterol, ondansetron **OR** ondansetron (ZOFRAN) IV     Physical Examination: BP 121/87 mmHg  Pulse 87  Temp(Src) 97.5 F (36.4 C) (Oral)  Resp 18  Ht 6' (1.829 m)  Wt 95.528 kg (210 lb 9.6 oz)  BMI 28.56 kg/m2  SpO2 95% Gen: NAD, alert and oriented x 0, mumbling inconherently.   Neck: supple, no JVD or thyromegaly, + head cocked to R side Chest: CTA bilaterally, no wheezes, crackles, or other adventitious sounds CV: RRR, no m/g/c/r Abd: soft, NT, ND, +BS in all four quadrants; no HSM, guarding, ridigity, or rebound tenderness,  + PEG c/d/i Ext: no edema, well perfused with 2+ pulses, Skin: no rash or lesions noted Lymph: no LAD  Data: Lab Results  Component Value Date   WBC 4.5 06/21/2015   HGB 12.1* 06/21/2015   HCT 38.3* 06/21/2015   MCV 82.6 06/21/2015   PLT 182 06/21/2015    Recent Labs Lab 06/19/15 2119 06/20/15 0457 06/21/15 0554  HGB 13.3 12.3* 12.1*    Lab Results  Component Value Date   NA 143 06/21/2015   K 3.9 06/21/2015   CL 110 06/21/2015   CO2 27 06/21/2015   BUN 10 06/21/2015   CREATININE 0.38* 06/21/2015   Lab Results  Component Value Date   ALT 71* 06/19/2015   AST 34 06/19/2015   ALKPHOS 67 06/19/2015   BILITOT 0.5 06/19/2015   No results for input(s): APTT, INR, PTT in the last 168 hours.   Assessment/Plan: [name] is a 29 y.o. male with refractory c.diff.   Improved today, only one stool since 7 am. Suspect the dificid is taking effect.   Recommendations: - cont dificid 200 mg BID for 10 days. Possible stool transplant if loses response to dificid late next week.    Please call with questions or concerns.  REIN, Addison NaegeliMATTHEW GORDON, MD

## 2015-06-23 NOTE — Care Management (Signed)
Discussed case and discharge plan with Dr Sherryll BurgerShah and with Dr Judithann SheenSparks for concerns about patient meeting admission criteria and patient mother wanting patient to stay until stool transplant next Friday.

## 2015-06-23 NOTE — Progress Notes (Signed)
[facility] - [place] [facility]   PATIENT NAME: [name] [name] [name]    MR#:  [id number]  DATE OF BIRTH:  06/22/1986  SUBJECTIVE:  CHIEF COMPLAINT:  No chief complaint on file.  no new issues. mom wants to keep him here till next friday for stool transplant (just because she has been very inappropriate in her conversations I've decided not to confront/ague with her) REVIEW OF SYSTEMS:   ROS unable to obtain  DRUG ALLERGIES:   Allergies  Allergen Reactions  . Ambien [Zolpidem] Other (See Comments)    Patient start hallucinating.   . Ativan [Lorazepam] Itching and Other (See Comments)    Patient gets hot and sweaty. Hallucinations   . Depakote Er [Divalproex Sodium Er] Itching and Other (See Comments)    Patient gets hot and sweaty.  . Dilaudid [Hydromorphone] Hives and Other (See Comments)    Patient gets hot and sweaty.  Marland Kitchen. Keppra [Levetiracetam] Itching and Other (See Comments)    Patient gets hot and sweaty.    VITALS:  Blood pressure 127/74, pulse 79, temperature 97.5 F (36.4 C), temperature source Oral, resp. rate 16, height 6' (1.829 m), weight 95.528 kg (210 lb 9.6 oz), SpO2 94 %. PHYSICAL EXAMINATION:  GENERAL: 29 y.o.-year-old patient lying in the bed with no acute distress.  EYES: Pupils equal, round, reactive to light and accommodation. No scleral icterus. Extraocular muscles intact.  HEENT: Head atraumatic, normocephalic. Oropharynx and nasopharynx clear.  NECK: Supple, no jugular venous distention. No thyroid enlargement, no tenderness.  LUNGS: Normal breath sounds bilaterally, no wheezing, rales,rhonchi or crepitation. No use of accessory muscles of respiration.  CARDIOVASCULAR: S1, S2 normal. No murmurs, rubs, or gallops.  ABDOMEN: Soft, nontender, nondistended. Bowel sounds present. No organomegaly or mass. PEG tube in situ. No guarding no rebound EXTREMITIES: No pedal edema, cyanosis, or clubbing. He does have one stage I  pressure ulcer on the bottom of the left foot and one on the left heel NEUROLOGIC: Mumbling, does not follow commands. PSYCHIATRIC: The patient is alert, awake but does not follow command. SKIN: No obvious rash, lesion, or ulcer.  LABORATORY PANEL:   CBC  Recent Labs Lab 06/21/15 0554  WBC 4.5  HGB 12.1*  HCT 38.3*  PLT 182   ------------------------------------------------------------------------------------------------------------------  Chemistries   Recent Labs Lab 06/19/15 2119  06/21/15 0554  NA 141  < > 143  K 4.3  < > 3.9  CL 104  < > 110  CO2 31  < > 27  GLUCOSE 91  < > 96  BUN 12  < > 10  CREATININE 0.43*  < > 0.38*  CALCIUM 9.5  < > 8.9  MG 2.0  --   --   AST 34  --   --   ALT 71*  --   --   ALKPHOS 67  --   --   BILITOT 0.5  --   --   < > = values in this interval not displayed. ------------------------------------------------------------------------------------------------------------------  Cardiac Enzymes No results for input(s): TROPONINI in the last 168 hours. ------------------------------------------------------------------------------------------------------------------  RADIOLOGY:  No results found.  EKG:   Orders placed or performed during the hospital encounter of 01/29/15  . ED EKG  . ED EKG  . ED EKG  . ED EKG  . EKG 12-Lead  . EKG 12-Lead  . EKG    ASSESSMENT AND PLAN:   #1 recurrent Clostridium difficile colitis: Appreciate gastroenterology consultation. Continue deficid 200 mg twice a day 10  days. Considering stool transplant 2 days after course complete.  Discussed with Dr. Mechele Collin and Dr. Shelle Iron. This is a Refractory case of c.diff to PO vanc and flagyl. Bowel prep starting next Wednesday with plan for stool transplant next Friday per Dr Donato Schultz. There has been no stool recheck here - will order stool for c.diff today  #2 history of pulmonary emboli: Continue elequis  #3 history of traumatic brain injury: His sitter is  here who knows him well. She states that he is almost back to his mental status baseline but Mother has been very frustrated and upset.  His mother wants him transferred to [facility] (which to my understanding is not practically possible but will try) and very inappropriate and verbally abusive for things beyond our control. She also demands stool transplant on this admission which I have informed her is beyond my control as per GI it wont' be done until he finishes course of deficid which means likely stool transplant on Dec 2nd (d/w Dr Markham Jordan And Dr Shelle Iron)     All the records are reviewed and case discussed with Care Management/Social Workerr. Management plans discussed with the patient, family and they are in agreement.  CODE STATUS: Full  TOTAL TIME TAKING CARE OF THIS PATIENT: 15 minutes.   Greater than 50% of time spent in care coordination and counseling. POSSIBLE D/C IN ?, DEPENDING ON CLINICAL CONDITION and family decision and demands and GI plans.   [facility], Soumya Colson M.D on 06/23/2015 at 1:59 PM  Between 7am to 6pm - Pager - [phone]  After 6pm go to [email or web address] - password [email or web address] [facility]  [place] [facility]  Office  [phone]  CC: Primary care physician; [name]., MD

## 2015-06-24 NOTE — Progress Notes (Signed)
[facility] - [place] at [facility]   PATIENT NAME: [name] [name] [name]    MR#:  [id number]  DATE OF BIRTH:  04/23/1986  SUBJECTIVE:  CHIEF COMPLAINT:  No chief complaint on file.  Stool c.diff neg (ordered and resulted 11/25).  No loose bowel movement reported REVIEW OF SYSTEMS:   ROS unable to obtain  DRUG ALLERGIES:   Allergies  Allergen Reactions  . Ambien [Zolpidem] Other (See Comments)    Patient start hallucinating.   . Ativan [Lorazepam] Itching and Other (See Comments)    Patient gets hot and sweaty. Hallucinations   . Depakote Er [Divalproex Sodium Er] Itching and Other (See Comments)    Patient gets hot and sweaty.  . Dilaudid [Hydromorphone] Hives and Other (See Comments)    Patient gets hot and sweaty.  Marland Kitchen. Keppra [Levetiracetam] Itching and Other (See Comments)    Patient gets hot and sweaty.    VITALS:  Blood pressure 111/58, pulse 85, temperature 99 F (37.2 C), temperature source Axillary, resp. rate 16, height 6' (1.829 m), weight 95.528 kg (210 lb 9.6 oz), SpO2 93 %. PHYSICAL EXAMINATION:  GENERAL: 29 y.o.-year-old patient lying in the bed with no acute distress.  EYES: Pupils equal, round, reactive to light and accommodation. No scleral icterus. Extraocular muscles intact.  HEENT: Head atraumatic, normocephalic. Oropharynx and nasopharynx clear.  NECK: Supple, no jugular venous distention. No thyroid enlargement, no tenderness.  LUNGS: Normal breath sounds bilaterally, no wheezing, rales,rhonchi or crepitation. No use of accessory muscles of respiration.  CARDIOVASCULAR: S1, S2 normal. No murmurs, rubs, or gallops.  ABDOMEN: Soft, nontender, nondistended. Bowel sounds present. No organomegaly or mass. PEG tube in situ. No guarding no rebound EXTREMITIES: No pedal edema, cyanosis, or clubbing. He does have one stage I pressure ulcer on the bottom of the left foot and one on the left heel NEUROLOGIC: Mumbling, does not follow  commands. PSYCHIATRIC: The patient is alert, awake but does not follow command. SKIN: No obvious rash, lesion, or ulcer.  LABORATORY PANEL:   CBC  Recent Labs Lab 06/21/15 0554  WBC 4.5  HGB 12.1*  HCT 38.3*  PLT 182   ------------------------------------------------------------------------------------------------------------------  Chemistries   Recent Labs Lab 06/19/15 2119  06/21/15 0554  NA 141  < > 143  K 4.3  < > 3.9  CL 104  < > 110  CO2 31  < > 27  GLUCOSE 91  < > 96  BUN 12  < > 10  CREATININE 0.43*  < > 0.38*  CALCIUM 9.5  < > 8.9  MG 2.0  --   --   AST 34  --   --   ALT 71*  --   --   ALKPHOS 67  --   --   BILITOT 0.5  --   --   < > = values in this interval not displayed. ------------------------------------------------------------------------------------------------------------------  Cardiac Enzymes No results for input(s): TROPONINI in the last 168 hours. ------------------------------------------------------------------------------------------------------------------  RADIOLOGY:  No results found.  EKG:   Orders placed or performed during the hospital encounter of 01/29/15  . ED EKG  . ED EKG  . ED EKG  . ED EKG  . EKG 12-Lead  . EKG 12-Lead  . EKG    ASSESSMENT AND PLAN:   #1 recurrent Clostridium difficile colitis: Appreciate gastroenterology consultation. Continue deficid 200 mg twice a day 10 days.  Stool for C. difficile was rechecked on 25th of November was reported negative same day.7.  Not  having any more loose bowel movement   #2 history of pulmonary emboli: Continue elequis  #3 history of traumatic brain injury: His sitter is here who knows him well. She states that he is almost back to his mental status baseline but Mother has been very frustrated and upset.  Discussed with mother about stool C. difficile result.  She is agreeable with discharge back to home tomorrow   All the records are reviewed and case discussed with  Care Management/Social Worker. Management plans discussed with the patient, mom and they are in agreement.  CODE STATUS: Full  TOTAL TIME TAKING CARE OF THIS PATIENT: 15 minutes.   Greater than 50% of time spent in care coordination and counseling. POSSIBLE D/C IN AM   [facility], Corwin Kuiken M.D on 06/24/2015 at 11:08 AM  Between 7am to 6pm - Pager - [phone]  After 6pm go to [email or web address] - password [email or web address] [facility]  [facility]  Office  [phone]  CC: Primary care physician; [name]., MD

## 2015-06-24 NOTE — Progress Notes (Signed)
Patient tolerated svn treatment well along with chest therapy with his home vest. Vitals stable. Continues to talk throughout entire process.

## 2015-06-25 MED ORDER — FIDAXOMICIN 200 MG PO TABS: 200.0000 mg | ORAL_TABLET | Freq: Two times a day (BID) | ORAL | Status: DC

## 2015-06-25 MED ORDER — PROPRANOLOL HCL ER 60 MG PO CP24: 60.0000 mg | ORAL_CAPSULE | Freq: Every day | ORAL | Status: DC

## 2015-06-25 NOTE — Care Management Note (Signed)
Case Management Note  Patient Details  Name: Fayrene [name] Smits MRN: [id number] Date of Birth: 10/28/1985  Subjective/Objective:   Discussed discharge planning with Mr Morten's mother who chose [facility] to be his home health PT provider. A referral was called and faxed to [facility] requesting home health PT.                  Action/Plan:   Expected Discharge Date:                  Expected Discharge Plan:     In-House Referral:     Discharge planning Services     Post Acute Care Choice:    Choice offered to:     DME Arranged:    DME Agency:     HH Arranged:    HH Agency:     Status of Service:     Medicare Important Message Given:    Date Medicare IM Given:    Medicare IM give by:    Date Additional Medicare IM Given:    Additional Medicare Important Message give by:     If discussed at Long Length of Stay Meetings, dates discussed:    Additional Comments:  Luiza Carranco A, RN 06/25/2015, 11:03 AM

## 2015-06-25 NOTE — Discharge Instructions (Signed)
Clostridium Difficile Infection Clostridium difficile (C. difficile or C. diff) is a germ found in the intestines. C. difficile infection can happen after taking antibiotic medicines. Antiobiotics may cause the C. difficile to grow out of control and make a toxin that causes the infection. C. difficile infection can cause watery poop (diarrhea) or severe disease. HOME CARE  Drink enough fluids to keep your pee (urine) clear or pale yellow. Avoid milk, caffeine, and alcohol.  Ask your doctor how to replace the body fluids you lost (rehydrate).  Eat small meals often. Avoid eating large meals.  Take your antibiotics as told. Finish them even if you start to feel better.  Do not  take medicines that help watery poop. These medicines may stop you from healing as fast or cause problems.  Wash your hands after using the bathroom and before touching food. Make sure people who live with you wash their hands often.  Clean all surfaces in your home. Use a product that has chlorine bleach in it. GET HELP IF:  Your watery poop lasts longer than expected.  Your watery poop comes back after you finish your antibiotic medicine.  You feel very dry or thirsty (dehydrated).  You have a fever. GET HELP RIGHT AWAY IF:   You have more stomach pain or tenderness.  You have blood in your poop (stool). Your poop may look black and tar-like.  You cannot eat or drink without throwing up (vomiting).   This information is not intended to replace advice given to you by your health care provider. Make sure you discuss any questions you have with your health care provider.   Document Released: 05/12/2009 Document Revised: 08/05/2014 Document Reviewed: 01/16/2015 Elsevier Interactive Patient Education 2016 Elsevier Inc.  

## 2015-06-25 NOTE — Discharge Summary (Signed)
[facility] - [place] at [facility]   PATIENT NAME: [name] [name]    MR#:  [id number]  DATE OF BIRTH:  Mar 30, 1986  DATE OF ADMISSION:  06/19/2015 ADMITTING PHYSICIAN: Shaune Pollack, MD  DATE OF DISCHARGE: No discharge date for patient encounter.  PRIMARY CARE PHYSICIAN: [name]., MD    ADMISSION DIAGNOSIS:  recurrent c diff colitis   DISCHARGE DIAGNOSIS:  Principal Problem:   Colitis, Clostridium difficile   SECONDARY DIAGNOSIS:   Past Medical History  Diagnosis Date  . Hepatitis C   . Scoliosis   . TBI (traumatic brain injury) (HCC) 07/2011  . Subarachnoid hemorrhage (HCC)   . Pulmonary emboli (HCC)   . Hepatitis C   . Depression     HOSPITAL COURSE:  29 y.o. male with TBI and history of Pulmonary Embolus admitted with recurrent C. difficile infection.  Patient's C. difficile was positive in Dr. Rondel Baton office 4 days before admission and he was started on Dificid.  As per admission note.  He was not improving, for which she was admitted for possible stool transplant.  Please see Dr. Nicky Pugh dictated history and physical for further details.  GI consultation was obtained with Dr. Bluford Kaufmann who recommended continuing Dificid, patient was not having any more loose stool while in the hospital and his repeat C. difficile test was 25 of November.  This was discussed with patient's mother who is in agreement with not getting any further treatment and then continuing oral antibiotics.  Usually he was scheduled for stool transplant on December 2 by Dr. Mechele Collin, but now with negative repeat check of C. difficile.  That will be held off.   He is recommended to finish course of deficit for total 10 days.  If his symptoms recur within a week after that including more than 3 loose watery bowel movement without getting any laxatives.  recheck C. difficile at that point.  Mother is in agreement with same.  He is being discharged back to home in stable condition. DISCHARGE  CONDITIONS:  Stable CONSULTS OBTAINED:  Treatment Team:  Elnita Maxwell, MD  DRUG ALLERGIES:   Allergies  Allergen Reactions  . Ambien [Zolpidem] Other (See Comments)    Patient start hallucinating.   . Ativan [Lorazepam] Itching and Other (See Comments)    Patient gets hot and sweaty. Hallucinations   . Depakote Er [Divalproex Sodium Er] Itching and Other (See Comments)    Patient gets hot and sweaty.  . Dilaudid [Hydromorphone] Hives and Other (See Comments)    Patient gets hot and sweaty.  Marland Kitchen Keppra [Levetiracetam] Itching and Other (See Comments)    Patient gets hot and sweaty.    DISCHARGE MEDICATIONS:   Current Discharge Medication List    START taking these medications   Details  fidaxomicin (DIFICID) 200 MG TABS tablet Take 1 tablet (200 mg total) by mouth 2 (two) times daily. Qty: 10 tablet, Refills: 0    propranolol ER (INDERAL LA) 60 MG 24 hr capsule Take 1 capsule (60 mg total) by mouth daily. Qty: 30 capsule, Refills: 0      CONTINUE these medications which have NOT CHANGED   Details  acidophilus (RISAQUAD) CAPS capsule Take 1 capsule by mouth 2 (two) times daily. Qty: 1 capsule, Refills: 1    albuterol (PROVENTIL) (2.5 MG/3ML) 0.083% nebulizer solution Inhale 3 mLs into the lungs 2 (two) times daily as needed.    apixaban (ELIQUIS) 5 MG TABS tablet Take 5 mg by mouth 2 (two)  times daily.    gabapentin (NEURONTIN) 300 MG capsule Take 900 mg by mouth 3 (three) times daily.     indomethacin (INDOCIN SR) 75 MG CR capsule Take 75 mg by mouth daily.    lacosamide (VIMPAT) 200 MG TABS tablet Take 200 mg by mouth 2 (two) times daily.    Multiple Vitamins-Minerals (MULTIVITAMIN WITH MINERALS) tablet Take 1 tablet by mouth daily.    Nutritional Supplements (FEEDING SUPPLEMENT, JEVITY 1.5 CAL/FIBER,) LIQD Place 237 mLs into feeding tube 5 (five) times daily.    nystatin (MYCOSTATIN/NYSTOP) 100000 UNIT/GM POWD Apply 1 g topically 2 (two) times daily.      OLANZapine (ZYPREXA) 15 MG tablet Take 7.5 mg by mouth at bedtime.     perphenazine (TRILAFON) 4 MG tablet Take 4 mg by mouth 2 (two) times daily.    Respiratory Therapy Supplies (FLUTTER) DEVI Use as directed Qty: 1 each, Refills: 0    risedronate (ACTONEL) 35 MG tablet Take 35 mg by mouth every 7 (seven) days. with water on empty stomach, nothing by mouth or lie down for next 30 minutes. Take on Saturday.    Water For Irrigation, Sterile (FREE WATER) SOLN Place 300 mLs into feeding tube every 6 (six) hours. Qty: 1000 mL, Refills: 11    Cranberry 1000 MG CAPS Take 1,000 mg by mouth daily.       STOP taking these medications     bisacodyl (DULCOLAX) 10 MG suppository      metroNIDAZOLE (FLAGYL) 500 MG tablet      senna (SENOKOT) 8.6 MG TABS tablet      vancomycin (VANCOCIN) 50 mg/mL oral solution        DISCHARGE INSTRUCTIONS:    DIET:  Tube feeds  DISCHARGE CONDITION:  Stable  ACTIVITY:  Bedrest  OXYGEN:  Home Oxygen: No.   Oxygen Delivery: room air  DISCHARGE LOCATION:  home   If you experience worsening of your admission symptoms, develop shortness of breath, life threatening emergency, suicidal or homicidal thoughts you must seek medical attention immediately by calling 911 or calling your MD immediately  if symptoms less severe.  You Must read complete instructions/literature along with all the possible adverse reactions/side effects for all the Medicines you take and that have been prescribed to you. Take any new Medicines after you have completely understood and accpet all the possible adverse reactions/side effects.   Please note  You were cared for by a hospitalist during your hospital stay. If you have any questions about your discharge medications or the care you received while you were in the hospital after you are discharged, you can call the unit and asked to speak with the hospitalist on call if the hospitalist that took care of you is not  available. Once you are discharged, your primary care physician will handle any further medical issues. Please note that NO REFILLS for any discharge medications will be authorized once you are discharged, as it is imperative that you return to your primary care physician (or establish a relationship with a primary care physician if you do not have one) for your aftercare needs so that they can reassess your need for medications and monitor your lab values.    On the day of Discharge:   VITAL SIGNS:  Blood pressure 109/55, pulse 77, temperature 98.3 F (36.8 C), temperature source Oral, resp. rate 20, height 6' (1.829 m), weight 95.528 kg (210 lb 9.6 oz), SpO2 93 %.  I/O:   Intake/Output Summary (Last 24 hours)  at 06/25/15 0934 Last data filed at 06/25/15 1610  Gross per 24 hour  Intake 3530.67 ml  Output   2800 ml  Net 730.67 ml    PHYSICAL EXAMINATION:  GENERAL:  29 y.o.-year-old patient lying in the bed with no acute distress.  HENT:  Head: Normocephalic and atraumatic.  Mouth/Throat: Oropharynx is clear and moist.  Eyes: Conjunctivae and EOM are normal. Pupils are equal, round, and reactive to light. No scleral icterus.  Neck: Normal range of motion. Neck supple. No JVD present. No thyromegaly present.  Cardiovascular: Normal rate, regular rhythm and intact distal pulses. Exam reveals no gallop and no friction rub.  No murmur heard. Respiratory: Effort normal and breath sounds normal. No respiratory distress. He has no wheezes. He has no rales.  GI: Soft. Bowel sounds are normal. He exhibits no distension. There is no tenderness.  Musculoskeletal: He exhibits no edema  Quadriplegic. No arthritis, no gout.  Lymphadenopathy:   He has no cervical adenopathy.  Neurological: He is alert and oriented to person, place, and time. No cranial nerve deficit.  No dysarthria, no aphasia  Skin: Skin is warm and dry. No rash noted. No erythema.  Psychiatric: He has a normal mood and  affect. His behavior is normal. Judgment and thought content normal.  DATA REVIEW:   CBC  Recent Labs Lab 06/21/15 0554  WBC 4.5  HGB 12.1*  HCT 38.3*  PLT 182    Chemistries   Recent Labs Lab 06/19/15 2119  06/21/15 0554  NA 141  < > 143  K 4.3  < > 3.9  CL 104  < > 110  CO2 31  < > 27  GLUCOSE 91  < > 96  BUN 12  < > 10  CREATININE 0.43*  < > 0.38*  CALCIUM 9.5  < > 8.9  MG 2.0  --   --   AST 34  --   --   ALT 71*  --   --   ALKPHOS 67  --   --   BILITOT 0.5  --   --   < > = values in this interval not displayed.  Cardiac Enzymes No results for input(s): TROPONINI in the last 168 hours.  Microbiology Results  Results for orders placed or performed during the hospital encounter of 06/19/15  C difficile quick scan w PCR reflex     Status: None   Collection Time: 06/23/15 10:13 PM  Result Value Ref Range Status   C Diff antigen NEGATIVE NEGATIVE Final   C Diff toxin NEGATIVE NEGATIVE Final   C Diff interpretation Negative for C. difficile  Final    RADIOLOGY:  No results found.   Management plans discussed with the patient, family and they are in agreement.  CODE STATUS:     Code Status Orders        Start     Ordered   06/19/15 2051  Full code   Continuous     06/19/15 2050      TOTAL TIME TAKING CARE OF THIS PATIENT: 55 minutes.    [facility], Hyden Soley M.D on 06/25/2015 at 9:34 AM  Between 7am to 6pm - Pager - [phone]  After 6pm go to [email or web address] - password [email or web address] [facility]  [place] [facility]  Office  [phone]  CC: Primary care physician; [name]., MD Lynnae Prude , M.D.

## 2015-06-25 NOTE — Progress Notes (Signed)
Resting comfortably on home bipap with 2liter bleedin

## 2015-06-25 NOTE — Care Management Note (Signed)
Case Management Note  Patient Details  Name: Fayrene [name] Gautreau MRN: [id number] Date of Birth: 01/13/1986  Subjective/Objective:      Left message on Mom's phone to please call this writer to give her preferred home health provider for home health PT.              Action/Plan:   Expected Discharge Date:                  Expected Discharge Plan:     In-House Referral:     Discharge planning Services     Post Acute Care Choice:    Choice offered to:     DME Arranged:    DME Agency:     HH Arranged:    HH Agency:     Status of Service:     Medicare Important Message Given:    Date Medicare IM Given:    Medicare IM give by:    Date Additional Medicare IM Given:    Additional Medicare Important Message give by:     If discussed at Long Length of Stay Meetings, dates discussed:    Additional Comments:  Doriann Zuch A, RN 06/25/2015, 9:56 AM

## 2015-06-26 MED ORDER — FIDAXOMICIN 200 MG PO TABS: 200.0000 mg | ORAL_TABLET | Freq: Two times a day (BID) | ORAL | Status: DC

## 2015-06-26 NOTE — Care Management (Signed)
Patient discharged on Sunday with Rx for Dificid. [facility] and is cost prohibitive. No area pharmacies have medication in stock. Spoke wit patient mother [name] [name] who stated that patient has enough medications for today. Contacted [employer] representative [name] who [name] . [name] and spoke with [name] who will process pre auth.  Contacted [employer] and applied for discount card and sent card to [facility] to see what co pay would be . Was informed by [name] that co pay is $700. Continue to work issue.

## 2015-06-26 NOTE — Care Management (Signed)
Spoke with [name] [name] at [facility]. [phone] Who confirms that insurance had approved RX. Medication will be Fed Ex overnight and should arrive first thing tomorrow, with no co-pay due .  Arline AspCindy will send me tracking number via Email. Contacted Mrs [name] GainesJeanie [name] and also Dr [name] [name] to inform this. Mrs [name] [name] expressed appreciation.

## 2015-06-28 ENCOUNTER — Encounter: Payer: Self-pay | Admitting: Pulmonary Disease

## 2015-06-28 ENCOUNTER — Ambulatory Visit (INDEPENDENT_AMBULATORY_CARE_PROVIDER_SITE_OTHER): Payer: BLUE CROSS/BLUE SHIELD | Admitting: Pulmonary Disease

## 2015-06-28 VITALS — BP 120/64 | HR 75 | Ht 71.0 in | Wt 212.0 lb

## 2015-06-28 DIAGNOSIS — T17500A Unspecified foreign body in bronchus causing asphyxiation, initial encounter: Secondary | ICD-10-CM

## 2015-06-28 DIAGNOSIS — J181 Lobar pneumonia, unspecified organism: Secondary | ICD-10-CM

## 2015-06-28 DIAGNOSIS — J189 Pneumonia, unspecified organism: Secondary | ICD-10-CM

## 2015-06-28 DIAGNOSIS — J9809 Other diseases of bronchus, not elsewhere classified: Secondary | ICD-10-CM | POA: Diagnosis not present
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
## 2015-06-30 NOTE — Care Management (Signed)
Update.  06/29/2015 1210pm  Received a [facility] who stated that patient had been denied transfer after review by medical staff.  Informed Ms Daphane ShepherdMeyer that [facility]

## 2015-07-11 ENCOUNTER — Ambulatory Visit: Payer: BLUE CROSS/BLUE SHIELD | Attending: Internal Medicine | Admitting: Speech Pathology

## 2015-07-11 DIAGNOSIS — R471 Dysarthria and anarthria: Secondary | ICD-10-CM | POA: Insufficient documentation

## 2015-07-11 DIAGNOSIS — M24541 Contracture, right hand: Secondary | ICD-10-CM | POA: Insufficient documentation

## 2015-07-12 ENCOUNTER — Encounter: Payer: Self-pay | Admitting: Speech Pathology

## 2015-07-12 NOTE — Therapy (Signed)
[facility] [address] [place], [place], [address] Phone: [phone]   Fax:  [phone]  Speech Language Pathology Evaluation  Patient Details  Name: [name] [name] [name] MRN: [id number] Date of Birth: 08/04/1985 Referring Provider: Dr. Hyacinth MeekerMiller  Encounter Date: 07/11/2015      End of Session - 07/12/15 1630    Visit Number 1   Number of Visits 8   Date for SLP Re-Evaluation 09/13/15   SLP Start Time 1500   SLP Stop Time  1559   SLP Time Calculation (min) 59 min   Activity Tolerance Patient tolerated treatment well      Past Medical History  Diagnosis Date  . Hepatitis C   . Scoliosis   . TBI (traumatic brain injury) (HCC) 07/2011  . Subarachnoid hemorrhage (HCC)   . Pulmonary emboli (HCC)   . Hepatitis C   . Depression     Past Surgical History  Procedure Laterality Date  . Fracture surgery      left acetabulum  . Ileostomy    . Lung tube    . Ankle surgery Bilateral   . Tracheostomy    . Peg tube placement    . Janeway gastrostomy  07/02/2014  . Ivc filter placement ([facility] hx)    . Brain surgery      There were no vitals filed for this visit.  Visit Diagnosis: Dysarthria - Plan: SLP plan of care cert/re-cert      Subjective Assessment - 07/12/15 1628    Subjective 29 year old man, S/P TBI in 2013 with resultant chronic dysphagia and dysarthria, is presenting with severe dysarthria and moderate oropharyngeal dysphagia.  The patient has had multiple swallowing evaluations, [facility]              SLP Evaluation [facility] - 07/12/15 0001    SLP Visit Information   SLP Received On 07/11/15   Referring Provider Dr. Hyacinth MeekerMiller   Onset Date 07/05/2015   Medical Diagnosis TBI 2013   Pain Assessment   Pain Score --  Patient able to alert caregiver to location of specific pain   Pain Assessment   Pain Assessment No/denies pain   Cognition   Overall Cognitive Status History  of cognitive impairments - at baseline   Oral Motor/Sensory Function   Overall Oral Motor/Sensory Function Impaired at baseline   Labial ROM Reduced right;Reduced left   Labial Symmetry Within Functional Limits   Labial Strength Reduced   Labial Coordination Reduced   Lingual ROM Reduced right;Reduced left   Lingual Symmetry Within Functional Limits   Lingual Strength Reduced   Lingual Coordination Reduced   Mandible Impaired   Overall Oral Motor/Sensory Function Severe dysarthria   Motor Speech   Overall Motor Speech Impaired at baseline   Respiration Impaired   Level of Impairment Conversation   Phonation Low vocal intensity;Hoarse;Breathy   Resonance Hypernasality   Articulation Impaired   Level of Impairment Word   Intelligibility Intelligibility reduced   Word --  Intelligible only when agitated   Phonation Impaired   Vocal Abuses Habitual Hyperphonia   Volume Soft   Pitch --  Frequently aphonic              SLP Education - 07/12/15 1629    Education provided Yes   Education Details Tongue, lips, and jaw exercises, respiratory support exercises   Person(s) Educated Patient;Caregiver(s)   Methods Explanation;Demonstration;Verbal cues;Handout   Comprehension Verbalized understanding;Returned demonstration;Need further  instruction            SLP Long Term Goals - 07/12/15 1634    SLP LONG TERM GOAL #1   Title Pt will improve oral motor strength / coordination for speech and swallowing by completing oral motor exercises with assistance from trained caregivers.   Time 6   Period Weeks   Status New   SLP LONG TERM GOAL #2   Title Pt will improve breath support for speech and swallowing by completing breath support exercises with assistance from trained caregivers.   Time 6   Period Weeks   Status New          Plan - 07/12/15 1631    Clinical Impression Statement At 4 years post onset of TBI, the patient is presenting with severe dysarthria  characterized by severely reduced range, strength, and coordination of oral musculature as well as severely reduced respiratory support for speech and airway protection.   The patient will benefit from consistent implementation of a home exercise program to promote improved oral motor strength/coordination and improved breath support.  Such a program is best when implemented daily and all caregivers are trained in accurate administration of the exercises.  Will schedule the patient once a week for 5-6 weeks for caregiver training and refinement of the specific program to maximize patient response and benefit.     Speech Therapy Frequency 1x /week   Duration Other (comment)  6 weeks   Potential to Achieve Goals Fair   Potential Considerations Ability to learn/carryover information;Cooperation/participation level;Severity of impairments   SLP Home Exercise Plan Tongue, lips, and jaw exercises, breath support exercises   Consulted and Agree with Plan of Care Patient;Family member/caregiver   Family Member Consulted Caregiver/CNA        Problem List Patient Active Problem List   Diagnosis Date Noted  . Colitis, Clostridium difficile 06/19/2015  . Hypoxia 05/25/2015  . Clostridium difficile infection 05/25/2015  . Mucus plugging of bronchi 05/25/2015  . Low grade fever 05/25/2015  . History of pulmonary embolism 05/25/2015  . OSA on CPAP 05/25/2015  . Pressure ulcer 05/25/2015  . Convulsions/seizures (HCC) 02/10/2015  . Aspiration pneumonia (HCC) 02/07/2015  . Encephalopathy, metabolic 02/07/2015  . Acute respiratory failure (HCC) 01/29/2015  . Skin irritation 11/09/2014  . TBI (traumatic brain injury) (HCC) 02/04/2012  . 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 Heterotopic ossification 02/04/2012   [name] Primrose, MS/CCC- SLP  Leandrew Koyanagi 07/12/2015, 4:39 PM  [place] [facility] MAIN [facility] SERVICES [address] [place], [place],  [address] Phone: [phone]   Fax:  [phone]  Name: [name] [name] MRN: [id number] Date of Birth: [birth year]

## 2015-07-19 ENCOUNTER — Ambulatory Visit: Payer: BLUE CROSS/BLUE SHIELD | Admitting: Occupational Therapy

## 2015-07-19 DIAGNOSIS — R471 Dysarthria and anarthria: Secondary | ICD-10-CM | POA: Diagnosis not present

## 2015-07-19 DIAGNOSIS — M24541 Contracture, right hand: Secondary | ICD-10-CM

## 2015-07-20 ENCOUNTER — Encounter: Payer: Self-pay | Admitting: Occupational Therapy

## 2015-07-20 NOTE — Therapy (Signed)
[place] [facility] [address] [place], [place], [address] Phone: [phone]   Fax:  [phone]  Occupational Therapy Evaluation  Patient Details  Name: [name] [name] Wulff MRN: [id number] Date of Birth: 12/01/1985 Referring Provider: Lysbeth PennerPfeil  Encounter Date: 07/19/2015      OT End of Session - 07/20/15 1258    Visit Number 1   Number of Visits 3   Date for OT Re-Evaluation 08/17/15   OT Start Time 1345   OT Stop Time 1445   OT Time Calculation (min) 60 min   Activity Tolerance Treatment limited secondary to medical complications (Comment);Treatment limited secondary to agitation   Behavior During Therapy Agitated;Anxious      Past Medical History  Diagnosis Date  . Hepatitis C   . Scoliosis   . TBI (traumatic brain injury) (HCC) 07/2011  . Subarachnoid hemorrhage (HCC)   . Pulmonary emboli (HCC)   . Hepatitis C   . Depression     Past Surgical History  Procedure Laterality Date  . Fracture surgery      left acetabulum  . Ileostomy    . Lung tube    . Ankle surgery Bilateral   . Tracheostomy    . Peg tube placement    . Janeway gastrostomy  07/02/2014  . Ivc filter placement ([facility] hx)    . Brain surgery      There were no vitals filed for this visit.  Visit Diagnosis:  Contracture of finger joint, right      Subjective Assessment - 07/19/15 1300    Subjective  patient is mostly non communicative at time of visit . Attendant reports he has contractures beginning at right hand and wrist  Patient tolerates splint application, assists as able. Reports no pain when asked.   Pertinent History TBI 2013    Currently in Pain? 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 Aide demonstrated understanding of use and precautions.                    OT Education - 07/20/15 1257    Education provided Yes   Education Details Instructed in splint wear, precautions around skin breakdown or irritation: assess at ulnar side of wrist. don and doff   Person(s) Educated Patient;Caregiver(s)   Methods Explanation;Demonstration   Comprehension Verbalized understanding;Returned Therapist, occupationaldemonstration  Caregiver             OT Long Term Goals - 07/20/15 1304    OT LONG TERM GOAL #1   Title Patient will tolerate resting pan forearm based splint to resolve flexions contractures   Time 4   Period Weeks   Status New   OT LONG TERM GOAL #2   Title Caregiver will be able to don doff  splint and perform skin checks    Time 1   Period Days   Status Achieved               Plan - 07/20/15 1300    Clinical Impression Statement RUE flexion contractures beginning at digits and wrist, would benefit from a trial of OT services and splint use   Rehab Potential Fair   Clinical Impairments Affecting Rehab Potential Has an aide, TBI and co-morbidities, limitted ability to participate   OT Frequency Other (comment)  2 follow visits to check on splint to RUE   OT Treatment/Interventions Splinting;Other (comment);Patient/family education  Skin checks   Plan Fabricate long arm resting pan splint and follow up in 2 weeks    Consulted and Agree with Plan of Care Family member/caregiver        Problem List Patient Active Problem List   Diagnosis Date Noted  . Colitis, Clostridium difficile 06/19/2015  . Hypoxia 05/25/2015  . Clostridium difficile infection  05/25/2015  . Mucus plugging of bronchi 05/25/2015  . Low grade fever 05/25/2015  . History of pulmonary embolism 05/25/2015  . OSA on CPAP 05/25/2015  . Pressure ulcer 05/25/2015  . Convulsions/seizures (HCC) 02/10/2015  . Aspiration pneumonia (HCC) 02/07/2015  . Encephalopathy, metabolic 02/07/2015  . Acute respiratory failure (HCC) 01/29/2015  . Skin irritation 11/09/2014  . TBI (traumatic brain injury) (HCC) 02/04/2012  . 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 Heterotopic ossification 02/04/2012    [name] [name]  OTR/L 07/20/2015, 1:09 PM   [facility] [address] [place], [address] Phone: [phone]   Fax:  [phone]  Name: [name] [name] MRN: [id number] Date of Birth: 03-17-1986

## 2015-07-26 ENCOUNTER — Encounter: Payer: BLUE CROSS/BLUE SHIELD | Admitting: Speech Pathology

## 2015-07-27 ENCOUNTER — Ambulatory Visit: Payer: BLUE CROSS/BLUE SHIELD | Admitting: Speech Pathology

## 2015-07-27 DIAGNOSIS — R471 Dysarthria and anarthria: Secondary | ICD-10-CM

## 2015-07-28 ENCOUNTER — Encounter: Payer: Self-pay | Admitting: Speech Pathology

## 2015-07-28 NOTE — Therapy (Signed)
[name] [name] [address] [place], [place], [address] Phone: [phone]   Fax:  [phone]  Speech Language Pathology Treatment  Patient Details  Name: [name] [name] MRN: [id number] Date of Birth: 03/12/1986 Referring Provider: Dr. Hyacinth MeekerMiller  Encounter Date: 07/27/2015      End of Session - 07/28/15 0839    Visit Number 2   Number of Visits 8   Date for SLP Re-Evaluation 09/13/15   SLP Start Time 1500   SLP Stop Time  1549   SLP Time Calculation (min) 49 min   Activity Tolerance Patient limited by pain;Other (comment)  Patient agitated      Past Medical History  Diagnosis Date  . Hepatitis C   . Scoliosis   . TBI (traumatic brain injury) (HCC) 07/2011  . Subarachnoid hemorrhage (HCC)   . Pulmonary emboli (HCC)   . Hepatitis C   . Depression     Past Surgical History  Procedure Laterality Date  . Fracture surgery      left acetabulum  . Ileostomy    . Lung tube    . Ankle surgery Bilateral   . Tracheostomy    . Peg tube placement    . Janeway gastrostomy  07/02/2014  . Ivc filter placement ([facility] hx)    . Brain surgery      There were no vitals filed for this visit.  Visit Diagnosis: Dysarthria      Subjective Assessment - 07/28/15 0837    Subjective The patient is in apparent pain, but cannot effectively communicate the nature or location of pain.  He is very agitated today.   Currently in Pain? Other (Comment)  Cannot effectively communicate               ADULT SLP TREATMENT - 07/28/15 0001    General Information   Behavior/Cognition Agitated   HPI TBI 07/2011   Treatment Provided   Treatment provided Cognitive-Linquistic   Pain Assessment   Pain Assessment --  Patient not able to communicate RE: pain   Cognitive-Linquistic Treatment   Treatment focused on Dysarthria   Skilled Treatment The patient was agitated and not able to actively participate in therapy.  Caregiver education was  provided to [employer] CorporationChelsea Eakes.  Review of exercises for the tongue, lips, jaw, and voice with targeted responses identified (eg. promoting range vs. strength).   Assessment / Recommendations / Plan   Plan Continue with current plan of care   Progression Toward Goals   Progression toward goals Progressing toward goals  Caregiver education given          SLP Education - 07/28/15 [phone]    Education provided Yes   Education Details exercises for the tongue, lips, jaw, and voice   Person(s) Educated Patient;Caregiver(s)   Methods Explanation;Demonstration;Verbal cues;Handout   Comprehension Verbalized understanding;Need further instruction;Other (comment)  Caregiver education            SLP Long Term Goals - 07/12/15 1634    SLP LONG TERM GOAL #1   Title Pt will improve oral motor strength / coordination for speech and swallowing by completing oral motor exercises with assistance from trained caregivers.   Time 6   Period Weeks   Status New   SLP LONG TERM GOAL #2   Title Pt will improve breath support for speech and swallowing by completing breath support exercises with assistance from trained caregivers.   Time 6   Period Weeks   Status New  Plan - 07/28/15 0840    Clinical Impression Statement The patient was not able to actively participate in therapy secondary agitation.  Caregiver training provided.   Speech Therapy Frequency 1x /week   Duration Other (comment)   Treatment/Interventions Oral motor exercises;Other (comment)  Voice exercises   Potential to Achieve Goals Fair   Potential Considerations Ability to learn/carryover information;Cooperation/participation level;Severity of impairments   SLP Home Exercise Plan Tongue, lips, and jaw exercises, voice, breath support exercises   Consulted and Agree with Plan of Care Patient;Family member/caregiver   Family Member Consulted Caregiver Karl Pock        Problem List Patient Active Problem List    Diagnosis Date Noted  . Colitis, Clostridium difficile 06/19/2015  . Hypoxia 05/25/2015  . Clostridium difficile infection 05/25/2015  . Mucus plugging of bronchi 05/25/2015  . Low grade fever 05/25/2015  . History of pulmonary embolism 05/25/2015  . OSA on CPAP 05/25/2015  . Pressure ulcer 05/25/2015  . Convulsions/seizures (HCC) 02/10/2015  . Aspiration pneumonia (HCC) 02/07/2015  . Encephalopathy, metabolic 02/07/2015  . Acute respiratory failure (HCC) 01/29/2015  . Skin irritation 11/09/2014  . TBI (traumatic brain injury) (HCC) 02/04/2012  . 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 Heterotopic ossification 02/04/2012   Dollene Primrose, MS/CCC- SLP  Leandrew Koyanagi 07/28/2015, 8:43 AM  [place] [facility] MAIN [facility] [name] [address] [place], [place], [address] Phone: [phone]   Fax:  [phone]   Name: [name] [name] MRN: [id number] Date of Birth: 1985/10/06

## 2015-07-29 ENCOUNTER — Emergency Department
Admission: EM | Admit: 2015-07-29 | Discharge: 2015-07-29 | Disposition: A | Discharge status: Home / Self Care | Payer: BLUE CROSS/BLUE SHIELD | Attending: Student | Admitting: Student

## 2015-07-29 ENCOUNTER — Emergency Department: Payer: BLUE CROSS/BLUE SHIELD

## 2015-07-29 DIAGNOSIS — L98491 Non-pressure chronic ulcer of skin of other sites limited to breakdown of skin: Secondary | ICD-10-CM | POA: Insufficient documentation

## 2015-07-29 DIAGNOSIS — M62441 Contracture of muscle, right hand: Secondary | ICD-10-CM | POA: Diagnosis not present

## 2015-07-29 DIAGNOSIS — Z87891 Personal history of nicotine dependence: Secondary | ICD-10-CM | POA: Insufficient documentation

## 2015-07-29 DIAGNOSIS — M62442 Contracture of muscle, left hand: Secondary | ICD-10-CM | POA: Diagnosis not present

## 2015-07-29 DIAGNOSIS — M62561 Muscle wasting and atrophy, not elsewhere classified, right lower leg: Secondary | ICD-10-CM | POA: Diagnosis not present

## 2015-07-29 DIAGNOSIS — L03113 Cellulitis of right upper limb: Secondary | ICD-10-CM | POA: Diagnosis not present

## 2015-07-29 DIAGNOSIS — M62562 Muscle wasting and atrophy, not elsewhere classified, left lower leg: Secondary | ICD-10-CM | POA: Diagnosis not present

## 2015-07-29 DIAGNOSIS — Z792 Long term (current) use of antibiotics: Secondary | ICD-10-CM | POA: Insufficient documentation

## 2015-07-29 DIAGNOSIS — Z7901 Long term (current) use of anticoagulants: Secondary | ICD-10-CM | POA: Diagnosis not present

## 2015-07-29 DIAGNOSIS — Z79899 Other long term (current) drug therapy: Secondary | ICD-10-CM | POA: Insufficient documentation

## 2015-07-29 DIAGNOSIS — Z87820 Personal history of traumatic brain injury: Secondary | ICD-10-CM | POA: Diagnosis not present

## 2015-07-29 DIAGNOSIS — L089 Local infection of the skin and subcutaneous tissue, unspecified: Secondary | ICD-10-CM | POA: Diagnosis present

## 2015-07-29 LAB — BASIC METABOLIC PANEL
Anion gap: 6 (ref 5–15)
BUN: 11 mg/dL (ref 6–20)
CHLORIDE: 106 mmol/L (ref 101–111)
CO2: 28 mmol/L (ref 22–32)
CREATININE: 0.61 mg/dL (ref 0.61–1.24)
Calcium: 9.2 mg/dL (ref 8.9–10.3)
GFR calc non Af Amer: >60 mL/min (ref >60)
Glucose, Bld: 97 mg/dL (ref 65–99)
POTASSIUM: 4.4 mmol/L (ref 3.5–5.1)
SODIUM: 140 mmol/L (ref 135–145)

## 2015-07-29 LAB — URINALYSIS COMPLETE WITH MICROSCOPIC (ARMC ONLY)
Bacteria, UA: NONE SEEN
Bilirubin Urine: NEGATIVE
Glucose, UA: NEGATIVE mg/dL
Hgb urine dipstick: NEGATIVE
KETONES UR: NEGATIVE mg/dL
Leukocytes, UA: NEGATIVE
NITRITE: NEGATIVE
PROTEIN: NEGATIVE mg/dL
SPECIFIC GRAVITY, URINE: 1.02 (ref 1.005–1.030)
Squamous Epithelial / LPF: NONE SEEN
pH: 6 (ref 5.0–8.0)

## 2015-07-29 LAB — CBC WITH DIFFERENTIAL/PLATELET
BASOS ABS: 0 10*3/uL (ref 0–0.1)
BASOS PCT: 0 %
EOS ABS: 0.1 10*3/uL (ref 0–0.7)
Eosinophils Relative: 2 %
HCT: 43.8 % (ref 40.0–52.0)
HEMOGLOBIN: 14.2 g/dL (ref 13.0–18.0)
Lymphocytes Relative: 32 %
Lymphs Abs: 1.9 10*3/uL (ref 1.0–3.6)
MCH: 26.7 pg (ref 26.0–34.0)
MCHC: 32.4 g/dL (ref 32.0–36.0)
MCV: 82.4 fL (ref 80.0–100.0)
Monocytes Absolute: 0.7 10*3/uL (ref 0.2–1.0)
Monocytes Relative: 11 %
NEUTROS PCT: 55 %
Neutro Abs: 3.3 10*3/uL (ref 1.4–6.5)
Platelets: 192 10*3/uL (ref 150–440)
RBC: 5.32 MIL/uL (ref 4.40–5.90)
RDW: 15.1 % — ABNORMAL HIGH (ref 11.5–14.5)
WBC: 6 10*3/uL (ref 3.8–10.6)

## 2015-07-29 MED ORDER — OXYCODONE HCL 5 MG/5ML PO SOLN: 5.0000 mg | Freq: Four times a day (QID) | ORAL | Status: DC | PRN

## 2015-07-29 MED ORDER — OXYCODONE HCL 5 MG/5ML PO SOLN
ORAL | Status: AC
  Administered 2015-07-29: 10 mg
  Filled 2015-07-29: qty 5

## 2015-07-29 MED ORDER — OXYCODONE HCL 5 MG/5ML PO SOLN
10.0000 mg | Freq: Once | ORAL | Status: AC
Start: 2015-07-29 — End: 2015-07-29
  Administered 2015-07-29: 10 mg
  Filled 2015-07-29: qty 10

## 2015-07-29 NOTE — ED Notes (Signed)
Pt from home via EMS. TBI post 3 yrs, pt at baseline, has infection to R. hand, on 3 antibiotics but family is concerned injury is not getting better

## 2015-07-29 NOTE — ED Notes (Addendum)
Pts caregiver states pt had fever 99.7, gave advil 3 hrs ago, temp now 98.1. Caregiver also states pts fingers have doubled in size from swelling. States skin has been breaking down since infection.

## 2015-07-29 NOTE — ED Provider Notes (Signed)
[facility] Emergency Department Provider Note  ____________________________________________  Time seen: Approximately 4:56 PM  I have reviewed the triage vital signs and the nursing notes.   HISTORY  Chief Complaint Wound Infection  Caveat-history of present illness and review of systems is limited due to the patient's TBI. All information is obtained from his mother over the phone as well as his caregiver at bedside.  HPI [name] [name] is a 29 y.o. male with history of TBI with quadriplegia and severe disinhibition, pulmonary emboli on eliquis, hepatitis C who presents for evaluation of 2 weeks right hand swelling and erythema. Patient has had some erythema of the distal fingertips, mostly associated with the right third finger. This was treated with Bactroban with no improvement. Patient was seen by his primary care doctor, Dr. Hyacinth Meeker, on 07/27/2015 and started on doxycycline and septra for cellulitis without abscess however family and caregivers are concerned that his symptoms are not getting any better. Dr. Hyacinth Meeker also recommended that they cut the patient's fingernails because his fingernails were digging into the palm of his hand. Mother is concerned because the patient had a temperature of 99.7 today and over the past several days he has been agitated from time to time, though this can happen to him often. He has had no vomiting, no cough. He has been complaining of pain in the right hand.   Past Medical History  Diagnosis Date  . Hepatitis C   . Scoliosis   . TBI (traumatic brain injury) (HCC) 07/2011  . Subarachnoid hemorrhage (HCC)   . Pulmonary emboli (HCC)   . Hepatitis C   . Depression     Patient Active Problem List   Diagnosis Date Noted  . Colitis, Clostridium difficile 06/19/2015  . Hypoxia 05/25/2015  . Clostridium difficile infection 05/25/2015  . Mucus plugging of bronchi 05/25/2015  . Low grade fever 05/25/2015  . History of pulmonary  embolism 05/25/2015  . OSA on CPAP 05/25/2015  . Pressure ulcer 05/25/2015  . Convulsions/seizures (HCC) 02/10/2015  . Aspiration pneumonia (HCC) 02/07/2015  . Encephalopathy, metabolic 02/07/2015  . Acute respiratory failure (HCC) 01/29/2015  . Skin irritation 11/09/2014  . TBI (traumatic brain injury) (HCC) 02/04/2012  . 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 Fracture surgery      left acetabulum  . Ileostomy    . Lung tube    . Ankle surgery Bilateral   . Tracheostomy    . Peg tube placement    . Janeway gastrostomy  07/02/2014  . Ivc filter placement ([facility] hx)    . Brain surgery      Current Outpatient Rx  Name  Route  Sig  Dispense  Refill  . acidophilus (RISAQUAD) CAPS capsule   Oral   Take 1 capsule by mouth 2 (two) times daily.   1 capsule   1   . albuterol (PROVENTIL) (2.5 MG/3ML) 0.083% nebulizer solution   Inhalation   Inhale 3 mLs into the lungs 3 (three) times daily.          Marland Kitchen apixaban (ELIQUIS) 5 MG TABS tablet   Oral   Take 5 mg by mouth 3 (three) times daily.          . Cranberry 1000 MG CAPS   Oral   Take 1,000 mg by mouth daily.          . fidaxomicin (DIFICID) 200 MG TABS tablet  Oral   Take 1 tablet (200 mg total) by mouth 2 (two) times daily.   20 tablet   0   . gabapentin (NEURONTIN) 300 MG capsule   Oral   Take 900 mg by mouth 3 (three) times daily.          . indomethacin (INDOCIN SR) 75 MG CR capsule   Oral   Take 75 mg by mouth daily.         Marland Kitchen lacosamide (VIMPAT) 200 MG TABS tablet   Oral   Take 200 mg by mouth 2 (two) times daily.         . Multiple Vitamins-Minerals (MULTIVITAMIN WITH MINERALS) tablet   Oral   Take 1 tablet by mouth daily.         . Nutritional Supplements (FEEDING SUPPLEMENT, JEVITY 1.5 CAL/FIBER,) LIQD   Per Tube   Place 237 mLs into feeding tube 5 (five) times daily.         Marland Kitchen nystatin  (MYCOSTATIN/NYSTOP) 100000 UNIT/GM POWD   Topical   Apply 1 g topically 2 (two) times daily.          Marland Kitchen OLANZapine (ZYPREXA) 15 MG tablet   Oral   Take 7.5 mg by mouth at bedtime.          Marland Kitchen perphenazine (TRILAFON) 4 MG tablet   Oral   Take 4 mg by mouth 2 (two) times daily.         . propranolol ER (INDERAL LA) 60 MG 24 hr capsule   Oral   Take 1 capsule (60 mg total) by mouth daily.   30 capsule   0   . Respiratory Therapy Supplies (FLUTTER) DEVI      Use as directed   1 each   0   . risedronate (ACTONEL) 35 MG tablet   Oral   Take 35 mg by mouth every 7 (seven) days. with water on empty stomach, nothing by mouth or lie down for next 30 minutes. Take on Saturday.         . Water For Irrigation, Sterile (FREE WATER) SOLN   Per Tube   Place 300 mLs into feeding tube every 6 (six) hours.   1000 mL   11     Allergies Ambien; Ativan; Depakote er; Dilaudid; and Keppra  Family History  Problem Relation Age of Onset  . Hypertension    . Hyperlipidemia Father   . Autoimmune disease Mother   . Cancer Maternal Grandmother   . Heart disease Maternal Grandfather     Social History Social History  Substance Use Topics  . Smoking status: Former Smoker    Quit date: 08/07/2011  . Smokeless tobacco: Never Used  . Alcohol Use: No    Review of Systems Constitutional: elevated body temperature. Gastrointestinal: No vomiting.  No diarrhea.   Skin: Negative for rash.  Caveat-history of present illness and review of systems is limited due to the patient's TBI. All information is obtained from his mother over the phone as well as his caregiver at bedside. ____________________________________________   PHYSICAL EXAM:  VITAL SIGNS: ED Triage Vitals  Enc Vitals Group     BP 07/29/15 1650 106/64 mmHg     Pulse Rate 07/29/15 1650 71     Resp --      Temp 07/29/15 1651 98.1 F (36.7 C)     Temp src --      SpO2 07/29/15 1650 94 %     Weight --  Height  07/29/15 1649  (1.778 m)     Head Cir --      Peak Flow --      Pain Score --      Pain Loc --      Pain Edu? --      Excl. in GC? --     Constitutional: The patient is alert, appears comfortable but intermittently begins to get agitated and curse but this is a rectal bowl and he calmed down when offered chocolate. As well as severe TBI noted. Eyes: Conjunctivae are normal. PERRL. EOMI. Head: Atraumatic. Nose: No congestion/rhinnorhea. Mouth/Throat: Mucous membranes are moist.  Oropharynx non-erythematous. Neck: No stridor.  Cardiovascular: Normal rate, regular rhythm. Grossly normal heart sounds.  Good peripheral circulation. Respiratory: Normal respiratory effort.  No retractions. Lungs CTAB. Gastrointestinal: Soft and nontender. No distention.  Genitourinary: deferred Musculoskeletal: No lower extremity tenderness nor edema.  There is a mild amount of erythema associated with the distal aspect of the right third finger, on the ulnar side of the middle finger nail there is a healing laceration. There is no appreciable abscess, no fluctuance or drainage. There is no paronychia, there is no swelling of the distal pulp. It is mild erythema associated with the distal fourth finger but this may  just be local irritation given that his hand is contracted so tightly and his fingers are pushing into his right palm.  2+ right radial pulse. Neurologic:  Contractures of the hands with muscle wasting of the legs and no spontaneous movement of the lower extremities. Skin:  Skin is warm, dry and intact. No rash noted. There are some small areas of very superficial skin breakdown in the back of the left arm without associated cellulitis.   ____________________________________________   LABS (all labs ordered are listed, but only abnormal results are displayed)  Labs Reviewed  CBC WITH DIFFERENTIAL/PLATELET - Abnormal; Notable for the following:    RDW 15.1 (*)    All other components within  normal limits  URINALYSIS COMPLETEWITH MICROSCOPIC ([facility] ONLY) - Abnormal; Notable for the following:    Color, Urine YELLOW (*)    APPearance CLEAR (*)    All other components within normal limits  CULTURE, BLOOD (ROUTINE X 2)  CULTURE, BLOOD (ROUTINE X 2)  BASIC METABOLIC PANEL   ____________________________________________  EKG  none ____________________________________________  RADIOLOGY  CXR  IMPRESSION: 1. Low lung volumes without radiographic evidence of acute cardiopulmonary disease.  ____________________________________________   PROCEDURES  Procedure(s) performed: None  Critical Care performed: No  ____________________________________________   INITIAL IMPRESSION / ASSESSMENT AND PLAN / ED COURSE  Pertinent labs & imaging results that were available during my care of the patient were reviewed by me and considered in my medical decision making (see chart for details).  Kanav Kazmierczak is a 29 y.o. male with history of TBI with quadriplegia and severe disinhibition, pulmonary emboli on eliquis, hepatitis C who presents for evaluation of 2 weeks right hand swelling and erythema, diagnosed with cellulitis of the third and fourth distal fingers and treated for the past 2 days with Bactrim and doxycycline. On exam, he does not appear to be in distress and when he does become agitated he is easily redirectable when offered chocolate. The findings assisted with his hand do not appear consistent with severe cellulitis however his mother is quite concerned that he is "going septic" because his temperature was elevated today. I discussed with her that we would attempt to treat his pain and that  that would  be prudent  to continue his outpatient trial of antibiotics as he has only been on them for 2 days and his exam findings are not very concerning at this time. Additionally, I discussed with her that admission with IV antibiotics would probably not be in his best interest  unless absolutely needed especially in view of the fact that he has a history of recurrent refractory C. difficile colitis and was hospitalized for this as recently as 1 month ago. She voices understanding of this. We discussed that we would pursue a basic septic workup. She is quite demanding and requesting GI and infectious disease consults which I do not feel are necessary at this time however at her request I will attempt to get in contact with the patient's GI doctor. We'll obtain screening labs, chest x-ray, urinalysis and reassess for disposition.  ----------------------------------------- 7:48 PM on 07/29/2015 -----------------------------------------  Abs reviewed. Normal CBC and BMP. Urinalysis is not consistent with infection. Chest x-ray no acute cart up on a process. Patient still with vital signs that are stable and he is not meeting any septic criteria. discussed this with his mother and she is comfortable with discharge. I discussed with her meticulous indications for his immediate return to the emergency department as well as need to continue the above antibiotics and follow up with Dr. Hyacinth MeekerMiller on Monday. She voices understanding of this and is comfortable with the discharge plan. Will DC home with a short course of oxycodone for breakthrough pain. ____________________________________________   FINAL CLINICAL IMPRESSION(S) / ED DIAGNOSES  Final diagnoses:  Cellulitis of right upper extremity      Gayla DossEryka A Jahvier Aldea, MD 07/29/15 [phone]

## 2015-08-03 ENCOUNTER — Ambulatory Visit: Payer: Commercial Managed Care - HMO | Attending: Internal Medicine | Admitting: Speech Pathology

## 2015-08-03 ENCOUNTER — Telehealth: Payer: Self-pay | Admitting: *Deleted

## 2015-08-03 DIAGNOSIS — R471 Dysarthria and anarthria: Secondary | ICD-10-CM

## 2015-08-03 DIAGNOSIS — Z9181 History of falling: Secondary | ICD-10-CM | POA: Diagnosis present

## 2015-08-03 DIAGNOSIS — R278 Other lack of coordination: Secondary | ICD-10-CM | POA: Diagnosis present

## 2015-08-03 DIAGNOSIS — Z7409 Other reduced mobility: Secondary | ICD-10-CM | POA: Insufficient documentation

## 2015-08-03 DIAGNOSIS — M6281 Muscle weakness (generalized): Secondary | ICD-10-CM | POA: Diagnosis present

## 2015-08-03 LAB — CULTURE, BLOOD (ROUTINE X 2)
CULTURE: NO GROWTH
Culture: NO GROWTH

## 2015-08-03 MED ORDER — ALBUTEROL SULFATE (2.5 MG/3ML) 0.083% IN NEBU: 3.0000 mL | INHALATION_SOLUTION | Freq: Three times a day (TID) | RESPIRATORY_TRACT | Status: DC

## 2015-08-03 NOTE — Telephone Encounter (Signed)
°*  STAT* If patient is at the pharmacy, call can be transferred to refill team.   1. Which medications need to be refilled? (please list name of each medication and dose if known) Albuterol   2. Which pharmacy/location (including street and city if local pharmacy) is medication to be sent to? [facility]   Pt has been out for a few weeks. Need sent in as soon as we can send it.

## 2015-08-03 NOTE — Telephone Encounter (Signed)
[facility] calling instead of sending in refill for 120 which last patient about 14 days if we cn do  150 and it last him 30 days on albuterol  Please advise.

## 2015-08-03 NOTE — Telephone Encounter (Signed)
Approval given to fill Albuterol for 150 ML to last for 30 days to [facility]. Nothing further needed.

## 2015-08-03 NOTE — Telephone Encounter (Signed)
We have not filled Albuterol neb for pt before. It is listed as TID scheduled. Please advise if you want it sent TID scheduled or differently. Please advise.

## 2015-08-03 NOTE — Telephone Encounter (Signed)
Per DS, send Albuterol TID scheduled. Mom informed medication has been sent.

## 2015-08-04 ENCOUNTER — Institutional Professional Consult (permissible substitution): Payer: BLUE CROSS/BLUE SHIELD | Admitting: Pulmonary Disease

## 2015-08-04 ENCOUNTER — Encounter: Payer: Self-pay | Admitting: Speech Pathology

## 2015-08-04 NOTE — Therapy (Signed)
[facility] MAIN [facility] SERVICES [address] [place], [place], [address] Phone: [phone]   Fax:  [phone]  Speech Language Pathology Treatment  Patient Details  Name: [name] [name] MRN: [id number] Date of Birth: October 16, 1985 Referring Provider: Dr. Hyacinth Meeker  Encounter Date: 08/03/2015      End of Session - 08/04/15 6962    Visit Number 3   Number of Visits 8   Date for SLP Re-Evaluation 09/13/15   SLP Start Time 1500   SLP Stop Time  1545   SLP Time Calculation (min) 45 min   Activity Tolerance Patient limited by pain;Other (comment)  agitation      Past Medical History  Diagnosis Date  . Hepatitis C   . Scoliosis   . TBI (traumatic brain injury) (HCC) 07/2011  . Subarachnoid hemorrhage (HCC)   . Pulmonary emboli (HCC)   . Hepatitis C   . Depression     Past Surgical History  Procedure Laterality Date  . Fracture surgery      left acetabulum  . Ileostomy    . Lung tube    . Ankle surgery Bilateral   . Tracheostomy    . Peg tube placement    . Janeway gastrostomy  07/02/2014  . Ivc filter placement ([facility] hx)    . Brain surgery      There were no vitals filed for this visit.  Visit Diagnosis: Dysarthria      Subjective Assessment - 08/04/15 0818    Subjective The patient was more cooperative today but remains easily agitated with poor participation.   Currently in Pain? Other (Comment)  Cannot effectively communicate pain level or location               ADULT SLP TREATMENT - 08/04/15 0001    General Information   Behavior/Cognition Agitated  less agitated than last session   HPI TBI 07/2011   Treatment Provided   Treatment provided Cognitive-Linquistic   Pain Assessment   Pain Assessment --  Cannot effectively communicate RE: pain level or location   Cognitive-Linquistic Treatment   Treatment focused on Dysarthria   Skilled Treatment The patient was less agitated with better participation in therapy.   Patient completed 1-3 reps of 6 of 16 tongue, lip, jaw, and voice exercises.  Caregiver education was provided to [employer].  Review of exercises for the tongue, lips, jaw, and voice with targeted responses identified (eg. promoting range vs. strength).   Assessment / Recommendations / Plan   Plan Continue with current plan of care   Progression Toward Goals   Progression toward goals Progressing toward goals          SLP Education - 08/04/15 0820    Education provided Yes   Education Details exercises for the tongue, lips, jaw, and voice   Person(s) Educated Patient;Caregiver(s)   Methods Explanation;Demonstration;Verbal cues;Tactile cues;Handout   Comprehension Verbalized understanding;Need further instruction;Other (comment)  caregiver education            SLP Long Term Goals - 07/12/15 1634    SLP LONG TERM GOAL #1   Title Pt will improve oral motor strength / coordination for speech and swallowing by completing oral motor exercises with assistance from trained caregivers.   Time 6   Period Weeks   Status New   SLP LONG TERM GOAL #2   Title Pt will improve breath support for speech and swallowing by completing breath support exercises with assistance from trained caregivers.  Time 6   Period Weeks   Status New          Plan - 08/04/15 [id number]    Clinical Impression Statement The patient was more cooperative today, but only completes 1-3 reps of any given exercise.  Caregiver training provided.   Speech Therapy Frequency 1x /week   Duration 1 week   Treatment/Interventions Oral motor exercises;Other (comment)  Voice exercises   Potential to Achieve Goals Fair   Potential Considerations Ability to learn/carryover information;Cooperation/participation level;Severity of impairments   SLP Home Exercise Plan Tongue, lips, and jaw exercises, voice, breath support exercises   Consulted and Agree with Plan of Care Patient;Family member/caregiver   Family Member  Consulted Caregiver [name] [name]        Problem List Patient Active Problem List   Diagnosis Date Noted  . Colitis, Clostridium difficile 06/19/2015  . Hypoxia 05/25/2015  . Clostridium difficile infection 05/25/2015  . Mucus plugging of bronchi 05/25/2015  . Low grade fever 05/25/2015  . History of pulmonary embolism 05/25/2015  . OSA on CPAP 05/25/2015  . Pressure ulcer 05/25/2015  . Convulsions/seizures (HCC) 02/10/2015  . Aspiration pneumonia (HCC) 02/07/2015  . Encephalopathy, metabolic 02/07/2015  . Acute respiratory failure (HCC) 01/29/2015  . Skin irritation 11/09/2014  . TBI (traumatic brain injury) (HCC) 02/04/2012  . 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 Heterotopic ossification 02/04/2012   Dollene PrimroseSusan G Zadaya [name], MS/CCC- SLP  [name] [name], [name] [name] 08/04/2015, 8:23 AM  [facility] [address] [place], [place], [address] Phone: [phone]   Fax:  [phone]   Name: [name] [name] MRN: [id number] Date of Birth: 08/27/1985

## 2015-08-08 ENCOUNTER — Encounter: Payer: 59 | Attending: Surgery | Admitting: Surgery

## 2015-08-08 DIAGNOSIS — Z8619 Personal history of other infectious and parasitic diseases: Secondary | ICD-10-CM | POA: Insufficient documentation

## 2015-08-08 DIAGNOSIS — Z930 Tracheostomy status: Secondary | ICD-10-CM | POA: Insufficient documentation

## 2015-08-08 DIAGNOSIS — Z932 Ileostomy status: Secondary | ICD-10-CM | POA: Diagnosis not present

## 2015-08-08 DIAGNOSIS — Z87820 Personal history of traumatic brain injury: Secondary | ICD-10-CM | POA: Insufficient documentation

## 2015-08-08 DIAGNOSIS — Z931 Gastrostomy status: Secondary | ICD-10-CM | POA: Insufficient documentation

## 2015-08-08 DIAGNOSIS — L89622 Pressure ulcer of left heel, stage 2: Secondary | ICD-10-CM | POA: Diagnosis present

## 2015-08-08 DIAGNOSIS — Z87891 Personal history of nicotine dependence: Secondary | ICD-10-CM | POA: Insufficient documentation

## 2015-08-09 NOTE — Progress Notes (Signed)
[name] [name], Ignazio ([id number]) Visit Report for 08/08/2015 Abuse/Suicide Risk Screen Details Patient Name: [name] [name], Daruis Date of Service: 08/08/2015 9:30 AM Medical Record Number: [id number] Patient Account Number: [id number] Date of Birth/Sex: 12/06/1985 (29 y.o. Male) Treating RN: Clover MealyAfful, RN, BSN, [place] Sinkita Primary Care Physician: Bethann PunchesMiller, Mark Other Clinician: Referring Physician: Bethann PunchesMiller, Mark Treating Physician/Extender: Rudene ReBritto, Errol Weeks in Treatment: 0 Abuse/Suicide Risk Screen Items Answer ABUSE/SUICIDE RISK SCREEN: Has anyone close to you tried to hurt or harm you recentlyo No Do you feel uncomfortable with anyone in your familyo No Has anyone forced you do things that you didnot want to doo No Do you have any thoughts of harming yourselfo No Patient displays signs or symptoms of abuse and/or neglect. No Electronic Signature(s) Signed: 08/08/2015 4:56:50 PM By: Elpidio EricAfful, Rita BSN, RN Entered By: Elpidio EricAfful, Rita on 08/08/2015 09:59:12 [name] HelperIRBY, Yordan ([id number]) -------------------------------------------------------------------------------- Activities of Daily Living Details Patient Name: [name] HelperIRBY, Marquize Date of Service: 08/08/2015 9:30 AM Medical Record Number: [id number] Patient Account Number: [id number] Date of Birth/Sex: 07/26/1986 (29 y.o. Male) Treating RN: Clover MealyAfful, RN, BSN, [place] Sinkita Primary Care Physician: Bethann PunchesMiller, Mark Other Clinician: Referring Physician: Bethann PunchesMiller, Mark Treating Physician/Extender: Rudene ReBritto, Errol Weeks in Treatment: 0 Activities of Daily Living Items Answer Activities of Daily Living (Please select one for each item) Drive Automobile Not Able Take Medications Not Able Use Telephone Not Able Care for Appearance Not Able Use Toilet Not Able Mady HaagensenBath / Shower Not Able Dress Self Not Able Feed Self Not Able Walk Not Able Get In / Out Bed Not Able Housework Not Able Prepare Meals Not Able Handle Money Not Able Shop for Self Not Able Electronic Signature(s) Signed: 08/08/2015  4:56:50 PM By: Elpidio EricAfful, Rita BSN, RN Entered By: Elpidio EricAfful, Rita on 08/08/2015 09:58:58 [name] HelperIRBY, Angad ([id number]) -------------------------------------------------------------------------------- Education Assessment Details Patient Name: [name] HelperIRBY, Gal Date of Service: 08/08/2015 9:30 AM Medical Record Number: [id number] Patient Account Number: [id number] Date of Birth/Sex: 06/05/1986 (29 y.o. Male) Treating RN: Clover MealyAfful, RN, BSN, [place] Sinkita Primary Care Physician: Bethann PunchesMiller, Mark Other Clinician: Referring Physician: Bethann PunchesMiller, Mark Treating Physician/Extender: Rudene ReBritto, Errol Weeks in Treatment: 0 Primary Learner Assessed: Caregiver mother Learning Preferences/Education Level/Primary Language Learning Preference: Explanation Highest Education Level: High School Preferred Language: English Cognitive Barrier Assessment/Beliefs Language Barrier: No Physical Barrier Assessment Impaired Vision: No Impaired Hearing: No Decreased Hand dexterity: No Knowledge/Comprehension Assessment Knowledge Level: Medium Comprehension Level: Medium Ability to understand written Medium instructions: Ability to understand verbal Medium instructions: Motivation Assessment Anxiety Level: Calm Cooperation: Cooperative Education Importance: Acknowledges Need Interest in Health Problems: Asks Questions Perception: Coherent Willingness to Engage in Self- Medium Management Activities: Readiness to Engage in Self- Medium Management Activities: Electronic Signature(s) Signed: 08/08/2015 4:56:50 PM By: Elpidio EricAfful, Rita BSN, RN Entered By: Elpidio EricAfful, Rita on 08/08/2015 09:58:28 [name] HelperIRBY, Reilly ([id number]) -------------------------------------------------------------------------------- Fall Risk Assessment Details Patient Name: [name] HelperIRBY, Kacy Date of Service: 08/08/2015 9:30 AM Medical Record Number: [id number] Patient Account Number: [id number] Date of Birth/Sex: 10/11/1985 28(29 y.o. Male) Treating RN: Clover MealyAfful, RN, BSN, Rita Primary Care  Physician: Bethann PunchesMiller, Mark Other Clinician: Referring Physician: Bethann PunchesMiller, Mark Treating Physician/Extender: Rudene ReBritto, Errol Weeks in Treatment: 0 Fall Risk Assessment Items Have you had 2 or more falls in the last 12 monthso 0 No Have you had any fall that resulted in injury in the last 12 monthso 0 No FALL RISK ASSESSMENT: History of falling - immediate or within 3 months 0 No Secondary diagnosis 0 No Ambulatory aid None/bed rest/wheelchair/nurse 0 Yes Crutches/cane/walker 0 No Furniture 0 No IV Access/Saline Lock 0 No Gait/Training Normal/bed rest/immobile 0 Yes Weak 0  No Impaired 20 Yes Mental Status Oriented to own ability 0 No Electronic Signature(s) Signed: 08/08/2015 4:56:50 PM By: Elpidio Eric BSN, RN Entered By: Elpidio Eric on 08/08/2015 09:57:40 [name] Helper ([id number]) -------------------------------------------------------------------------------- Foot Assessment Details Patient Name: [name] Helper Date of Service: 08/08/2015 9:30 AM Medical Record Number: [id number] Patient Account Number: [id number] Date of Birth/Sex: 1986-07-28 (29 y.o. Male) Treating RN: Clover Mealy, RN, BSN, [place] Sink Primary Care Physician: Bethann Punches Other Clinician: Referring Physician: Bethann Punches Treating Physician/Extender: Rudene Re in Treatment: 0 Foot Assessment Items Site Locations + = Sensation present, - = Sensation absent, C = Callus, U = Ulcer R = Redness, W = Warmth, M = Maceration, PU = Pre-ulcerative lesion F = Fissure, S = Swelling, D = Dryness Assessment Right: Left: Other Deformity: No No Prior Foot Ulcer: No No Prior Amputation: No No Charcot Joint: No No Ambulatory Status: Non-ambulatory Assistance Device: Wheelchair Gait: Surveyor, mining) Signed: 08/08/2015 4:56:50 PM By: Elpidio Eric BSN, RN Entered By: Elpidio Eric on 08/08/2015 09:56:43 [name] Helper  ([id number]) -------------------------------------------------------------------------------- Nutrition Risk Assessment Details Patient Name: [name] Helper Date of Service: 08/08/2015 9:30 AM Medical Record Number: [id number] Patient Account Number: [id number] Date of Birth/Sex: [birth year] (29 y.o. Male) Treating RN: Clover Mealy, RN, BSN, Rita Primary Care Physician: Bethann Punches Other Clinician: Referring Physician: Bethann Punches Treating Physician/Extender: Rudene Re in Treatment: 0 Height (in): Weight (lbs): Body Mass Index (BMI): Nutrition Risk Assessment Items NUTRITION RISK SCREEN: I have an illness or condition that made me change the kind and/or 0 No amount of food I eat I eat fewer than two meals per day 3 Yes I eat few fruits and vegetables, or milk products 0 No I have three or more drinks of beer, liquor or wine almost every day 0 No I have tooth or mouth problems that make it hard for me to eat 0 No I don't always have enough money to buy the food I need 0 No I eat alone most of the time 0 No I take three or more different prescribed or over-the-counter drugs a 0 No day Without wanting to, I have lost or gained 10 pounds in the last six 2 Yes months I am not always physically able to shop, cook and/or feed myself 2 Yes Nutrition Protocols Good Risk Protocol Moderate Risk Protocol Provide education on High Risk Proctocol 0 Electronic Signature(s) Signed: 08/08/2015 4:56:50 PM By: Elpidio Eric BSN, RN Entered By: Elpidio Eric on 08/08/2015 09:57:19

## 2015-08-09 NOTE — Progress Notes (Signed)
MUHAMMAD, VACCA ([id number]) Visit Report for 08/08/2015 Allergy List Details Patient Name: [name] [name], [name] [name] Date of Service: 08/08/2015 9:30 AM Medical Record Number: [id number] Patient Account Number: [id number] Date of Birth/Sex: March 21, 1986 (29 y.o. Male) Treating RN: Clover Mealy, RN, BSN, [place] Sink Primary Care Physician: Bethann Punches Other Clinician: Referring Physician: Bethann Punches Treating Physician/Extender: Rudene Re in Treatment: 0 Allergies Active Allergies Ambien ativan Depakote dilauded Keppra Allergy Notes Electronic Signature(s) Signed: 08/08/2015 4:56:50 PM By: Elpidio Eric BSN, RN Entered By: Elpidio Eric on 08/08/2015 09:59:58 [name] [name] ([id number]) -------------------------------------------------------------------------------- Arrival Information Details Patient Name: [name] [name] Date of Service: 08/08/2015 9:30 AM Medical Record Number: [id number] Patient Account Number: [id number] Date of Birth/Sex: 08-11-1985 (29 y.o. Male) Treating RN: Clover Mealy, RN, BSN, [place] Sink Primary Care Physician: Bethann Punches Other Clinician: Referring Physician: Bethann Punches Treating Physician/Extender: Rudene Re in Treatment: 0 Visit Information Patient Arrived: Wheel Chair Arrival Time: 09:53 Accompanied By: mom Transfer Assistance: None Patient Identification Verified: Yes Secondary Verification Process Yes Completed: Patient Requires Transmission-Based No Precautions: Patient Has Alerts: No History Since Last Visit Added or deleted any medications: No Any new allergies or adverse reactions: No Had a fall or experienced change in activities of daily living that may affect risk of falls: No Signs or symptoms of abuse/neglect since last visito No Hospitalized since last visit: No Has Dressing in Place as Prescribed: Yes Electronic Signature(s) Signed: 08/08/2015 4:56:50 PM By: Elpidio Eric BSN, RN Entered By: Elpidio Eric on 08/08/2015 09:54:12 [name] [name]  ([id number]) -------------------------------------------------------------------------------- Clinic Level of Care Assessment Details Patient Name: [name] [name] Date of Service: 08/08/2015 9:30 AM Medical Record Number: [id number] Patient Account Number: [id number] Date of Birth/Sex: 1986-07-15 (29 y.o. Male) Treating RN: Clover Mealy, RN, BSN, [place] Sink Primary Care Physician: Bethann Punches Other Clinician: Referring Physician: Bethann Punches Treating Physician/Extender: Rudene Re in Treatment: 0 Clinic Level of Care Assessment Items TOOL 2 Quantity Score []  - Use when only an EandM is performed on the INITIAL visit 0 ASSESSMENTS - Nursing Assessment / Reassessment X - General Physical Exam (combine w/ comprehensive assessment (listed just 1 20 below) when performed on new pt. evals) X - Comprehensive Assessment (HX, ROS, Risk Assessments, Wounds Hx, etc.) 1 25 ASSESSMENTS - Wound and Skin Assessment / Reassessment X - Simple Wound Assessment / Reassessment - one wound 1 5 []  - Complex Wound Assessment / Reassessment - multiple wounds 0 []  - Dermatologic / Skin Assessment (not related to wound area) 0 ASSESSMENTS - Ostomy and/or Continence Assessment and Care []  - Incontinence Assessment and Management 0 []  - Ostomy Care Assessment and Management (repouching, etc.) 0 PROCESS - Coordination of Care X - Simple Patient / Family Education for ongoing care 1 15 []  - Complex (extensive) Patient / Family Education for ongoing care 0 []  - Staff obtains Chiropractor, Records, Test Results / Process Orders 0 []  - Staff telephones HHA, Nursing Homes / Clarify orders / etc 0 []  - Routine Transfer to another Facility (non-emergent condition) 0 []  - Routine Hospital Admission (non-emergent condition) 0 X - New Admissions / Manufacturing engineer / Ordering NPWT, Apligraf, etc. 1 15 []  - Emergency Hospital Admission (emergent condition) 0 []  - Simple Discharge Coordination 0 [name] [name], [name] [name]  ([id number]) []  - Complex (extensive) Discharge Coordination 0 PROCESS - Special Needs []  - Pediatric / Minor Patient Management 0 []  - Isolation Patient Management 0 []  - Hearing / Language / Visual special needs 0 []  - Assessment of Community assistance (transportation, D/C planning, etc.) 0 []  - Additional assistance / Altered  mentation 0 []  - Support Surface(s) Assessment (bed, cushion, seat, etc.) 0 INTERVENTIONS - Wound Cleansing / Measurement X - Wound Imaging (photographs - any number of wounds) 1 5 []  - Wound Tracing (instead of photographs) 0 X - Simple Wound Measurement - one wound 1 5 []  - Complex Wound Measurement - multiple wounds 0 []  - Simple Wound Cleansing - one wound 0 []  - Complex Wound Cleansing - multiple wounds 0 INTERVENTIONS - Wound Dressings X - Small Wound Dressing one or multiple wounds 1 10 []  - Medium Wound Dressing one or multiple wounds 0 []  - Large Wound Dressing one or multiple wounds 0 []  - Application of Medications - injection 0 INTERVENTIONS - Miscellaneous []  - External ear exam 0 []  - Specimen Collection (cultures, biopsies, blood, body fluids, etc.) 0 []  - Specimen(s) / Culture(s) sent or taken to Lab for analysis 0 []  - Patient Transfer (multiple staff / Nurse, adultHoyer Lift / Similar devices) 0 []  - Simple Staple / Suture removal (25 or less) 0 []  - Complex Staple / Suture removal (26 or more) 0 [name] [name], [name] [name] ([id number]) []  - Hypo / Hyperglycemic Management (close monitor of Blood Glucose) 0 []  - Ankle / Brachial Index (ABI) - do not check if billed separately 0 Has the patient been seen at the hospital within the last three years: Yes Total Score: 100 Level Of Care: New/Established - Level 3 Electronic Signature(s) Signed: 08/08/2015 4:56:50 PM By: Elpidio EricAfful, Rita BSN, RN Entered By: Elpidio EricAfful, Rita on 08/08/2015 10:38:47 [name] HelperIRBY, Keary ([id number]) -------------------------------------------------------------------------------- Encounter Discharge  Information Details Patient Name: [name] HelperIRBY, [name] [name] Date of Service: 08/08/2015 9:30 AM Medical Record Number: [id number] Patient Account Number: [id number] Date of Birth/Sex: 04/02/1986 [age] y.o. Male) Treating RN: Clover MealyAfful, RN, BSN, [place] Sinkita Primary Care Physician: Bethann PunchesMiller, Mark Other Clinician: Referring Physician: Bethann PunchesMiller, Mark Treating Physician/Extender: Rudene ReBritto, Errol Weeks in Treatment: 0 Encounter Discharge Information Items Discharge Pain Level: 0 Discharge Condition: Stable Ambulatory Status: Wheelchair Discharge Destination: Home Transportation: Other mother, Accompanied By: caregiver Schedule Follow-up Appointment: No Medication Reconciliation completed and provided to Patient/Care No Delmy Holdren: Provided on Clinical Summary of Care: 08/08/2015 Form Type Recipient Paper Patient ZI Electronic Signature(s) Signed: 08/08/2015 4:56:50 PM By: Elpidio EricAfful, Rita BSN, RN Previous Signature: 08/08/2015 10:37:27 AM Version By: Gwenlyn PerkingMoore, Shelia Entered By: Elpidio EricAfful, Rita on 08/08/2015 10:39:37 [name] HelperIRBY, Dayquan ([id number]) -------------------------------------------------------------------------------- Lower Extremity Assessment Details Patient Name: [name] HelperIRBY, [name] [name] Date of Service: 08/08/2015 9:30 AM Medical Record Number: [id number] Patient Account Number: [id number] Date of Birth/Sex: 05/06/1986 (29 y.o. Male) Treating RN: Clover MealyAfful, RN, BSN, [place] Sinkita Primary Care Physician: Bethann PunchesMiller, Mark Other Clinician: Referring Physician: Bethann PunchesMiller, Mark Treating Physician/Extender: Rudene ReBritto, Errol Weeks in Treatment: 0 Edema Assessment Assessed: Kyra Searles[Left: No] [Right: No] Edema: [Left: Ye] [Right: s] Vascular Assessment Pulses: Posterior Tibial Dorsalis Pedis Palpable: [Left:Yes] Extremity colors, hair growth, and conditions: Extremity Color: [Left:Normal] Hair Growth on Extremity: [Left:Yes] Temperature of Extremity: [Left:Warm] Capillary Refill: [Left:< 3 seconds] Toe Nail Assessment Left: Right: Thick: No Discolored:  No Deformed: No Improper Length and Hygiene: No Electronic Signature(s) Signed: 08/08/2015 4:56:50 PM By: Elpidio EricAfful, Rita BSN, RN Entered By: Elpidio EricAfful, Rita on 08/08/2015 10:05:42 [name] HelperIRBY, [name] [name] ([id number]) -------------------------------------------------------------------------------- Multi Wound Chart Details Patient Name: [name] HelperIRBY, [name] [name] Date of Service: 08/08/2015 9:30 AM Medical Record Number: [id number] Patient Account Number: [id number] Date of Birth/Sex: 12/29/1985 (29 y.o. Male) Treating RN: Clover MealyAfful, RN, BSN, Rita Primary Care Physician: Bethann PunchesMiller, Mark Other Clinician: Referring Physician: Bethann PunchesMiller, Mark Treating Physician/Extender: Rudene ReBritto, Errol Weeks in Treatment: 0 Vital Signs Height(in): 72 Pulse(bpm): 72 Weight(lbs): 200 Blood Pressure 106/66 (mmHg):  Body Mass Index(BMI): 27 Temperature(F): 97.3 Respiratory Rate 18 (breaths/min): Photos: [1:No Photos] [N/A:N/A] Wound Location: [1:Left Abdomen - Upper Quadrant] [N/A:N/A] Wounding Event: [1:Gradually Appeared] [N/A:N/A] Primary Etiology: [1:Infection - not elsewhere N/A classified] Comorbid History: [1:Hypertension, Hepatitis C, N/A History of pressure wounds, Paraplegia] Date Acquired: [1:03/27/2015] [N/A:N/A] Weeks of Treatment: [1:0] [N/A:N/A] Wound Status: [1:Open] [N/A:N/A] Measurements L x W x D 1.7x1x0.1 [N/A:N/A] (cm) Area (cm) : [1:1.335] [N/A:N/A] Volume (cm) : [1:0.134] [N/A:N/A] % Reduction in Area: [1:0.00%] [N/A:N/A] % Reduction in Volume: 0.00% [N/A:N/A] Classification: [1:Partial Thickness] [N/A:N/A] Exudate Amount: [1:Small] [N/A:N/A] Exudate Type: [1:Serosanguineous] [N/A:N/A] Exudate Color: [1:red, brown] [N/A:N/A] Wound Margin: [1:Distinct, outline attached N/A] Granulation Amount: [1:Small (1-33%)] [N/A:N/A] Granulation Quality: [1:Pale] [N/A:N/A] Necrotic Amount: [1:Large (67-100%)] [N/A:N/A] Exposed Structures: [1:Fascia: No Fat: No Tendon: No Muscle: No] [N/A:N/A] Joint: No Bone: No Limited  to Skin Breakdown Epithelialization: None N/A N/A Periwound Skin Texture: Edema: Yes N/A N/A Excoriation: No Induration: No Callus: No Crepitus: No Fluctuance: No Friable: No Rash: No Scarring: No Periwound Skin Moist: Yes N/A N/A Moisture: Maceration: No Dry/Scaly: No Periwound Skin Color: Atrophie Blanche: No N/A N/A Cyanosis: No Ecchymosis: No Erythema: No Hemosiderin Staining: No Mottled: No Pallor: No Rubor: No Temperature: No Abnormality N/A N/A Tenderness on Yes N/A N/A Palpation: Wound Preparation: Ulcer Cleansing: N/A N/A Rinsed/Irrigated with Saline Topical Anesthetic Applied: Other: lidocaine 4% Treatment Notes Electronic Signature(s) Signed: 08/08/2015 4:56:50 PM By: Elpidio Eric BSN, RN Entered By: Elpidio Eric on 08/08/2015 10:31:40 [name] [name] ([id number]) -------------------------------------------------------------------------------- Pain Assessment Details Patient Name: [name] [name] Date of Service: 08/08/2015 9:30 AM Medical Record Number: [id number] Patient Account Number: [id number] Date of Birth/Sex: [birth year] (29 y.o. Male) Treating RN: Clover Mealy, RN, BSN, Rita Primary Care Physician: Bethann Punches Other Clinician: Referring Physician: Bethann Punches Treating Physician/Extender: Rudene Re in Treatment: 0 Active Problems Location of Pain Severity and Description of Pain Patient Has Paino No Site Locations Pain Management and Medication Current Pain Management: Electronic Signature(s) Signed: 08/08/2015 4:56:50 PM By: Elpidio Eric BSN, RN Entered By: Elpidio Eric on 08/08/2015 09:54:19 [name] [name] ([id number]) -------------------------------------------------------------------------------- Wound Assessment Details Patient Name: [name] [name] Date of Service: 08/08/2015 9:30 AM Medical Record Number: [id number] Patient Account Number: [id number] Date of Birth/Sex: June 29, 1986 (29 y.o. Male) Treating RN: Clover Mealy, RN, BSN, Rita Primary Care  Physician: Bethann Punches Other Clinician: Referring Physician: Bethann Punches Treating Physician/Extender: Rudene Re in Treatment: 0 Wound Status Wound Number: 1 Primary Infection - not elsewhere classified Etiology: Wound Location: Left Abdomen - Upper Quadrant Wound Open Status: Wounding Event: Gradually Appeared Comorbid Hypertension, Hepatitis C, History of Date Acquired: 03/27/2015 History: pressure wounds, Paraplegia Weeks Of Treatment: 0 Clustered Wound: No Photos Photo Uploaded By: Elpidio Eric on 08/08/2015 16:33:26 Wound Measurements Length: (cm) 1.7 Width: (cm) 1 Depth: (cm) 0.1 Area: (cm) 1.335 Volume: (cm) 0.134 % Reduction in Area: 0% % Reduction in Volume: 0% Epithelialization: None Tunneling: No Undermining: No Wound Description Classification: Partial Thickness Wound Margin: Distinct, outline attached Exudate Amount: Small Exudate Type: Serosanguineous Exudate Color: red, brown Foul Odor After Cleansing: No Wound Bed Granulation Amount: Small (1-33%) Exposed Structure Granulation Quality: Pale Fascia Exposed: No Necrotic Amount: Large (67-100%) Fat Layer Exposed: No [name] [name], [name] [name] ([id number]) Necrotic Quality: Adherent Slough Tendon Exposed: No Muscle Exposed: No Joint Exposed: No Bone Exposed: No Limited to Skin Breakdown Periwound Skin Texture Texture Color No Abnormalities Noted: No No Abnormalities Noted: No Callus: No Atrophie Blanche: No Crepitus: No Cyanosis: No Excoriation: No Ecchymosis: No Fluctuance: No Erythema: No Friable: No  Hemosiderin Staining: No Induration: No Mottled: No Localized Edema: Yes Pallor: No Rash: No Rubor: No Scarring: No Temperature / Pain Moisture Temperature: No Abnormality No Abnormalities Noted: No Tenderness on Palpation: Yes Dry / Scaly: No Maceration: No Moist: Yes Wound Preparation Ulcer Cleansing: Rinsed/Irrigated with Saline Topical Anesthetic Applied: Other: lidocaine  4%, Electronic Signature(s) Signed: 08/08/2015 4:56:50 PM By: Elpidio Eric BSN, RN Entered By: Elpidio Eric on 08/08/2015 10:04:59 [name] [name] ([id number]) -------------------------------------------------------------------------------- Vitals Details Patient Name: [name] [name] Date of Service: 08/08/2015 9:30 AM Medical Record Number: [id number] Patient Account Number: [id number] Date of Birth/Sex: 1985-11-05 (29 y.o. Male) Treating RN: Afful, RN, BSN, Rita Primary Care Physician: Bethann Punches Other Clinician: Referring Physician: Bethann Punches Treating Physician/Extender: Rudene Re in Treatment: 0 Vital Signs Time Taken: 10:06 Temperature (F): 97.3 Height (in): 72 Pulse (bpm): 72 Source: Stated Respiratory Rate (breaths/min): 18 Weight (lbs): 200 Blood Pressure (mmHg): 106/66 Source: Stated Reference Range: 80 - 120 mg / dl Body Mass Index (BMI): 27.1 Electronic Signature(s) Signed: 08/08/2015 4:56:50 PM By: Elpidio Eric BSN, RN Entered By: Elpidio Eric on 08/08/2015 10:08:51

## 2015-08-09 NOTE — Progress Notes (Signed)
[name] [name], [name] [name] ([id number]) Visit Report for 08/08/2015 Chief Complaint Document Details Patient Name: [name] [name], [name] [name] Date of Service: 08/08/2015 9:30 AM Medical Record Number: [id number] Patient Account Number: [id number] Date of Birth/Sex: 1986/06/15 (29 y.o. Male) Treating RN: [name] Mealy, RN, BSN, [place] Sink Primary Care Physician: [name] [name] Other Clinician: Referring Physician: Bethann [name] Treating Physician/Extender: [name] [name] in Treatment: 0 Information Obtained from: Caregiver Chief Complaint Patient is at the clinic for treatment of an open pressure ulcer to his left heel which she's had for about 4-5 months Electronic Signature(s) Signed: 08/08/2015 11:30:42 AM By: [name] Kanner MD, FACS Entered By: [name] [name] on 08/08/2015 11:30:42 [name] [name] ([id number]) -------------------------------------------------------------------------------- HPI Details Patient Name: [name] [name] Date of Service: 08/08/2015 9:30 AM Medical Record Number: [id number] Patient Account Number: [id number] Date of Birth/Sex: [birth year] (29 y.o. Male) Treating RN: [name] Mealy, RN, BSN, [place] Sink Primary Care Physician: [name] [name] Other Clinician: Referring Physician: Bethann [name] Treating Physician/Extender: [name] [name] in Treatment: 0 History of Present Illness Location: left heel Quality: Patient reports No Pain. Severity: Patient states wound are getting worse. Duration: Patient has had the wound for > 3 months prior to seeking treatment at the wound center Context: The wound appeared gradually over time Modifying Factors: Other treatment(s) tried include:local care with some silver and zinc paste HPI Description: 30 year old male who has a history of traumatic brain injury and also has a ulcer on his left heel which was noted by the family about 4-5 months ago. The TBI was in January 2013 and prior to this visit the patient has had hyperbaric oxygen therapy approximately 2 years after his TBI. He  is had several gastrostomy tube placed by the gastroenterologist but then finally had a surgical procedure to bring the gastric mucosa to the skin surface. This was done approximately 9 months ago. Past medical history of hepatitis C, traumatic brain injury, pulmonary emboli, ileostomy, tracheostomy, PEG tube placement. Was a former smoker and quit in January 2013 Electronic Signature(s) Signed: 08/08/2015 11:32:43 AM By: [name] Kanner MD, FACS Entered By: [name] [name] on 08/08/2015 11:32:43 [name] [name] ([id number]) -------------------------------------------------------------------------------- Physical Exam Details Patient Name: [name] [name] Date of Service: 08/08/2015 9:30 AM Medical Record Number: [id number] Patient Account Number: [id number] Date of Birth/Sex: 13-Apr-1986 (29 y.o. Male) Treating RN: [name] Mealy, RN, BSN, [place] Sink Primary Care Physician: [name] [name] Other Clinician: Referring Physician: Bethann [name] Treating Physician/Extender: [name] [name] in Treatment: 0 Constitutional . Pulse regular. Respirations normal and unlabored. Afebrile. . Eyes Nonicteric. Reactive to light. Ears, Nose, Mouth, and Throat Lips, teeth, and gums WNL.Marland Kitchen Moist mucosa without lesions. Neck supple and nontender. No palpable supraclavicular or cervical adenopathy. Normal sized without goiter. Respiratory WNL. No retractions.. Cardiovascular Pedal Pulses WNL. it was not possible to measure his ABI. No clubbing, cyanosis or edema. Gastrointestinal (GI) Abdomen without masses or tenderness.. No liver or spleen enlargement or tenderness.. Lymphatic No adneopathy. No adenopathy. No adenopathy. Musculoskeletal Adexa without tenderness or enlargement.. Digits and nails w/o clubbing, cyanosis, infection, petechiae, ischemia, or inflammatory conditions.. Integumentary (Hair, Skin) No suspicious lesions. No crepitus or fluctuance. No peri-wound warmth or erythema. No masses.Marland Kitchen Psychiatric Judgement  and insight Intact.. No evidence of depression, anxiety, or agitation.. Notes he has a stage II pressure injury to his left heel with minimal debris and slough which was washed out with moist saline gauze. Electronic Signature(s) Signed: 08/08/2015 11:33:29 AM By: [name] Kanner MD, FACS Entered By: [name] [name] on 08/08/2015 11:33:28 [name] [name] ([id number]) -------------------------------------------------------------------------------- Physician Orders Details Patient Name: [name] [name] Date of Service:  08/08/2015 9:30 AM Medical Record Number: [id number] Patient Account Number: [id number] Date of Birth/Sex: 1986-03-09 (29 y.o. Male) Treating RN: [name] Mealy, RN, BSN, [place] Sink Primary Care Physician: [name] [name] Other Clinician: Referring Physician: Bethann [name] Treating Physician/Extender: [name] [name] in Treatment: 0 Verbal / Phone Orders: No Diagnosis Coding Wound Cleansing Wound #1 Left Calcaneus o Clean wound with Normal Saline. Skin Barriers/Peri-Wound Care Wound #1 Left Calcaneus o Skin Prep Primary Wound Dressing Wound #1 Left Calcaneus o Aquacel Ag Secondary Dressing Wound #1 Left Calcaneus o Boardered Foam Dressing Dressing Change Frequency Wound #1 Left Calcaneus o Change dressing every other day. Follow-up Appointments Wound #1 Left Calcaneus o Return Appointment in 1 week. Off-Loading Wound #1 Left Calcaneus o Heel suspension boot to: - SAGE boots Additional Orders / Instructions Wound #1 Left Calcaneus o Increase protein intake. o Other: - ZINC, MVI, Vitamin C Home Health [name] [name] ([id number]) Wound #1 Left Calcaneus o Continue Home Health Visits - advanced home health o Home Health Nurse may visit PRN to address patientos wound care needs. o FACE TO FACE ENCOUNTER: MEDICARE and MEDICAID PATIENTS: I certify that this patient is under my care and that I had a face-to-face encounter that meets the physician  face-to-face encounter requirements with this patient on this date. The encounter with the patient was in whole or in part for the following MEDICAL CONDITION: (primary reason for Home Healthcare) MEDICAL NECESSITY: I certify, that based on my findings, NURSING services are a medically necessary home health service. HOME BOUND STATUS: I certify that my clinical findings support that this patient is homebound (i.e., Due to illness or injury, pt requires aid of supportive devices such as crutches, cane, wheelchairs, walkers, the use of special transportation or the assistance of another person to leave their place of residence. There is a normal inability to leave the home and doing so requires considerable and taxing effort. Other absences are for medical reasons / religious services and are infrequent or of short duration when for other reasons). o If current dressing causes regression in wound condition, may D/C ordered dressing product/s and apply Normal Saline Moist Dressing daily until next Wound Healing Center / Other MD appointment. Notify Wound Healing Center of regression in wound condition at [phone]. Electronic Signature(s) Signed: 08/08/2015 4:32:01 PM By: [name] Kanner MD, FACS Signed: 08/08/2015 4:56:50 PM By: Elpidio Eric BSN, RN Entered By: Elpidio Eric on 08/08/2015 10:38:05 [name] [name] ([id number]) -------------------------------------------------------------------------------- Problem List Details Patient Name: [name] [name] Date of Service: 08/08/2015 9:30 AM Medical Record Number: [id number] Patient Account Number: [id number] Date of Birth/Sex: 04/23/1986 (29 y.o. Male) Treating RN: [name] Mealy, RN, BSN, [place] Sink Primary Care Physician: [name] [name] Other Clinician: Referring Physician: Bethann [name] Treating Physician/Extender: [name] [name] in Treatment: 0 Active Problems ICD-10 Encounter Code Description Active Date Diagnosis [phone] Pressure ulcer of left heel,  stage 2 08/08/2015 Yes Z87.820 Personal history of traumatic brain injury 08/08/2015 Yes Inactive Problems Resolved Problems Electronic Signature(s) Signed: 08/08/2015 11:30:05 AM By: [name] Kanner MD, FACS Entered By: [name] [name] on 08/08/2015 11:30:05 [name] [name] ([id number]) -------------------------------------------------------------------------------- Progress Note Details Patient Name: [name] [name] Date of Service: 08/08/2015 9:30 AM Medical Record Number: [id number] Patient Account Number: [id number] Date of Birth/Sex: 1985-10-27 (29 y.o. Male) Treating RN: [name] Mealy, RN, BSN, [place] Sink Primary Care Physician: [name] [name] Other Clinician: Referring Physician: Bethann [name] Treating Physician/Extender: [name] [name] in Treatment: 0 Subjective Chief Complaint Information obtained from Caregiver Patient is at the clinic for treatment of an open pressure ulcer to his left  heel which she's had for about 4-5 months History of Present Illness (HPI) The following HPI elements were documented for the patient's wound: Location: left heel Quality: Patient reports No Pain. Severity: Patient states wound are getting worse. Duration: Patient has had the wound for > 3 months prior to seeking treatment at the wound center Context: The wound appeared gradually over time Modifying Factors: Other treatment(s) tried include:local care with some silver and zinc paste 30 year old male who has a history of traumatic brain injury and also has a ulcer on his left heel which was noted by the family about 4-5 months ago. The TBI was in January 2013 and prior to this visit the patient has had hyperbaric oxygen therapy approximately 2 years after his TBI. He is had several gastrostomy tube placed by the gastroenterologist but then finally had a surgical procedure to bring the gastric mucosa to the skin surface. This was done approximately 9 months ago. Past medical history of hepatitis C, traumatic brain  injury, pulmonary emboli, ileostomy, tracheostomy, PEG tube placement. Was a former smoker and quit in January 2013 Wound History Patient presents with 1 open wound that has been present for approximately long time. The wound has been healed in the past but has [name]-opened. Laboratory tests have not been performed in the last month. Patient reportedly has tested positive for an antibiotic resistant organism. Patient reportedly has not tested positive for osteomyelitis. Patient reportedly has not had testing performed to evaluate circulation in the legs. Patient History Information obtained from Patient. Allergies Ambien, ativan, Depakote, dilauded, Keppra [name] [name], [name] [name] ([id number]) Family History Hypertension - Mother, No family history of Cancer, Diabetes, Heart Disease, Hereditary Spherocytosis, Kidney Disease, Lung Disease, Seizures, Stroke, Thyroid Problems, Tuberculosis. Social History Former smoker, Marital Status - Single, Alcohol Use - Never, Drug Use - No History, Caffeine Use - Never. Medical History Respiratory Denies history of Aspiration, Asthma, Chronic Obstructive Pulmonary Disease (COPD), Pneumothorax, Sleep Apnea, Tuberculosis Endocrine Denies history of Type I Diabetes, Type II Diabetes Genitourinary Denies history of End Stage Renal Disease Immunological Denies history of Lupus Erythematosus, Raynaud s, Scleroderma Integumentary (Skin) Patient has history of History of pressure wounds Medical And Surgical History Notes Respiratory lung tube, trachesotomy, Genitourinary foley Review of Systems (ROS) Eyes The patient has no complaints or symptoms. Ear/Nose/Mouth/Throat The patient has no complaints or symptoms. Hematologic/Lymphatic The patient has no complaints or symptoms. Respiratory The patient has no complaints or symptoms. Cardiovascular The patient has no complaints or symptoms. Gastrointestinal The patient has no complaints or  symptoms. Endocrine The patient has no complaints or symptoms. Genitourinary The patient has no complaints or symptoms. Immunological The patient has no complaints or symptoms. Integumentary (Skin) Complains or has symptoms of Wounds, Breakdown. Musculoskeletal The patient has no complaints or symptoms. Neurologic [name] [name], [name] [name] ([id number]) The patient has no complaints or symptoms. Oncologic The patient has no complaints or symptoms. Psychiatric Denies complaints or symptoms of Anxiety, Claustrophobia. Medications gabapentin 300 mg capsule oral 3 1 capsule oral Vimpat 200 mg tablet oral 1 1 tablet oral tamsulosin 0.4 mg capsule oral 1 1 capsule,extended release 24hr oral albuterol sulfate 2.5 mg/3 mL (0.083 %) solution for nebulization inhalation 1 1 solution for nebulization inhalation propranolol XL 80 mg capsule,extended release 24 hr oral 1 1 capsule,extended release 24hr oral Peridex 0.12 % mouthwash mucous membrane mouthwash mucous membrane Eliquis 5 mg tablet oral 2 2 tablets oral ibuprofen 600 mg tablet oral 1 1 tablet oral indomethacin ER 75 mg capsule,extended release oral 1 1  capsule, extended release oral baclofen 20 mg tablet oral 1 1 tablet oral Thick-It oral powder oral powder oral nystatin 100,000 unit/gram topical powder topical powder topical Objective Constitutional Pulse regular. Respirations normal and unlabored. Afebrile. Vitals Time Taken: 10:06 AM, Height: 72 in, Source: Stated, Weight: 200 lbs, Source: Stated, BMI: 27.1, Temperature: 97.3 F, Pulse: 72 bpm, Respiratory Rate: 18 breaths/min, Blood Pressure: 106/66 mmHg. Eyes Nonicteric. Reactive to light. Ears, Nose, Mouth, and Throat Lips, teeth, and gums WNL.Marland Kitchen Moist mucosa without lesions. Neck supple and nontender. No palpable supraclavicular or cervical adenopathy. Normal sized without goiter. Respiratory WNL. No retractions.Kayren Eaves, Earna Coder ([id number]) Cardiovascular Pedal Pulses WNL. it was  not possible to measure his ABI. No clubbing, cyanosis or edema. Gastrointestinal (GI) Abdomen without masses or tenderness.. No liver or spleen enlargement or tenderness.. Lymphatic No adneopathy. No adenopathy. No adenopathy. Musculoskeletal Adexa without tenderness or enlargement.. Digits and nails w/o clubbing, cyanosis, infection, petechiae, ischemia, or inflammatory conditions.Marland Kitchen Psychiatric Judgement and insight Intact.. No evidence of depression, anxiety, or agitation.. General Notes: he has a stage II pressure injury to his left heel with minimal debris and slough which was washed out with moist saline gauze. Integumentary (Hair, Skin) No suspicious lesions. No crepitus or fluctuance. No peri-wound warmth or erythema. No masses.. Wound #1 status is Open. Original cause of wound was Gradually Appeared. The wound is located on the Left Calcaneus. The wound measures 1.7cm length x 1cm width x 0.1cm depth; 1.335cm^2 area and 0.134cm^3 volume. The wound is limited to skin breakdown. There is no tunneling or undermining noted. There is a small amount of serosanguineous drainage noted. The wound margin is distinct with the outline attached to the wound base. There is small (1-33%) pale granulation within the wound bed. There is a large (67-100%) amount of necrotic tissue within the wound bed including Adherent Slough. The periwound skin appearance exhibited: Localized Edema, Moist. The periwound skin appearance did not exhibit: Callus, Crepitus, Excoriation, Fluctuance, Friable, Induration, Rash, Scarring, Dry/Scaly, Maceration, Atrophie Blanche, Cyanosis, Ecchymosis, Hemosiderin Staining, Mottled, Pallor, Rubor, Erythema. Periwound temperature was noted as No Abnormality. The periwound has tenderness on palpation. Assessment Active Problems ICD-10 N82.956 - Pressure ulcer of left heel, stage 2 Z87.820 - Personal history of traumatic brain injury [name] [name], [name] [name] ([id number]) This  30 year old gentleman with a traumatic brain injury and who is wheelchair-bound is now seen with the stage II pressure injury to his left heel. He has a very caring family and we have discussed in detail local wound care with Aquacel Ag and a heel protector. They also using Sage Boots, appropriateAir mattress and offloading appropriately. Have discussed increase protein intake, multivitamins, vitamin C and zinc supplements. He was seen as back on regular intervals in the office. Plan Wound Cleansing: Wound #1 Left Calcaneus: Clean wound with Normal Saline. Skin Barriers/Peri-Wound Care: Wound #1 Left Calcaneus: Skin Prep Primary Wound Dressing: Wound #1 Left Calcaneus: Aquacel Ag Secondary Dressing: Wound #1 Left Calcaneus: Boardered Foam Dressing Dressing Change Frequency: Wound #1 Left Calcaneus: Change dressing every other day. Follow-up Appointments: Wound #1 Left Calcaneus: Return Appointment in 1 week. Off-Loading: Wound #1 Left Calcaneus: Heel suspension boot to: - SAGE boots Additional Orders / Instructions: Wound #1 Left Calcaneus: Increase protein intake. Other: - ZINC, MVI, Vitamin C Home Health: Wound #1 Left Calcaneus: Continue Home Health Visits - advanced home health Home Health Nurse may visit PRN to address patient s wound care needs. FACE TO FACE ENCOUNTER: MEDICARE and MEDICAID PATIENTS: I certify that this patient  is under my care and that I had a face-to-face encounter that meets the physician face-to-face encounter requirements with this patient on this date. The encounter with the patient was in whole or in part for the following MEDICAL CONDITION: (primary reason for Home Healthcare) MEDICAL NECESSITY: I certify, that based on my findings, NURSING services are a medically necessary home health service. HOME BOUND STATUS: I certify that my clinical findings support that this patient is homebound (i.e., Due to illness or injury, pt requires aid of  supportive devices such as crutches, cane, wheelchairs, walkers, the use of special transportation or the assistance of another person to leave their place of residence. There is a normal inability to leave the home and doing so requires considerable and taxing effort. Other absences are [name] [name], [name] [name] ([id number]) for medical reasons / religious services and are infrequent or of short duration when for other reasons). If current dressing causes regression in wound condition, may D/C ordered dressing product/s and apply Normal Saline Moist Dressing daily until next Wound Healing Center / Other MD appointment. Notify Wound Healing Center of regression in wound condition at [phone]. This 30 year old gentleman with a traumatic brain injury and who is wheelchair-bound is now seen with the stage II pressure injury to his left heel. He has a very caring family and we have discussed in detail local wound care with Aquacel Ag and a heel protector. They also using Sage Boots, appropriateAir mattress and offloading appropriately. Have discussed increase protein intake, multivitamins, vitamin C and zinc supplements. He was seen as back on regular intervals in the office. Electronic Signature(s) Signed: 08/08/2015 11:35:08 AM By: [name] Kanner MD, FACS Entered By: [name] [name] on 08/08/2015 11:35:08 [name] [name] ([id number]) -------------------------------------------------------------------------------- ROS/PFSH Details Patient Name: [name] [name] Date of Service: 08/08/2015 9:30 AM Medical Record Number: [id number] Patient Account Number: [id number] Date of Birth/Sex: 1986-05-19 (29 y.o. Male) Treating RN: [name] Mealy, RN, BSN, Rita Primary Care Physician: [name] [name] Other Clinician: Referring Physician: Bethann [name] Treating Physician/Extender: [name] [name] in Treatment: 0 Information Obtained From Patient Wound History Do you currently have one or more open woundso Yes How many open  wounds do you currently haveo 1 Approximately how long have you had your woundso long time Has your wound(s) ever healed and then [name]-openedo Yes Have you had any lab work done in the past montho No Have you tested positive for osteomyelitis (bone infection)o No Have you had any tests for circulation on your legso No Integumentary (Skin) Complaints and Symptoms: Positive for: Wounds; Breakdown Medical History: Positive for: History of pressure wounds Negative for: History of Burn Psychiatric Complaints and Symptoms: Negative for: Anxiety; Claustrophobia Medical History: Negative for: Anorexia/bulimia; Confinement Anxiety Eyes Complaints and Symptoms: No Complaints or Symptoms Medical History: Negative for: Cataracts; Glaucoma; Optic Neuritis Ear/Nose/Mouth/Throat Complaints and Symptoms: No Complaints or Symptoms Medical HistoryBALTASAR, [name] [name] ([id number]) Negative for: Chronic sinus problems/congestion; Middle ear problems Hematologic/Lymphatic Complaints and Symptoms: No Complaints or Symptoms Medical History: Negative for: Anemia; Hemophilia; Human Immunodeficiency Virus; Lymphedema; Sickle Cell Disease Respiratory Complaints and Symptoms: No Complaints or Symptoms Medical History: Negative for: Aspiration; Asthma; Chronic Obstructive Pulmonary Disease (COPD); Pneumothorax; Sleep Apnea; Tuberculosis Past Medical History Notes: lung tube, trachesotomy, Cardiovascular Complaints and Symptoms: No Complaints or Symptoms Medical History: Positive for: Hypertension Gastrointestinal Complaints and Symptoms: No Complaints or Symptoms Medical History: Positive for: Hepatitis C Endocrine Complaints and Symptoms: No Complaints or Symptoms Medical History: Negative for: Type I Diabetes; Type II Diabetes Genitourinary Complaints and Symptoms: No Complaints  or Symptoms Medical History: Negative for: End Stage Renal Disease [name] [name], [name] [name] ([id number]) Past Medical  History Notes: foley Immunological Complaints and Symptoms: No Complaints or Symptoms Medical History: Negative for: Lupus Erythematosus; Raynaudos; Scleroderma Musculoskeletal Complaints and Symptoms: No Complaints or Symptoms Medical History: Negative for: Gout; Rheumatoid Arthritis; Osteoarthritis; Osteomyelitis Neurologic Complaints and Symptoms: No Complaints or Symptoms Medical History: Positive for: Paraplegia - heterotopic Negative for: Dementia; Neuropathy Oncologic Complaints and Symptoms: No Complaints or Symptoms Medical History: Negative for: Received Chemotherapy; Received Radiation Family and Social History Cancer: No; Diabetes: No; Heart Disease: No; Hereditary Spherocytosis: No; Hypertension: Yes - Mother; Kidney Disease: No; Lung Disease: No; Seizures: No; Stroke: No; Thyroid Problems: No; Tuberculosis: No; Former smoker; Marital Status - Single; Alcohol Use: Never; Drug Use: No History; Caffeine Use: Never; Financial Concerns: No; Food, Clothing or Shelter Needs: No; Support System Lacking: No; Advanced Directives: No; Patient does not want information on Advanced Directives; Living Will: No Physician Affirmation I have reviewed and agree with the above information. Electronic Signature(s) Signed: 08/08/2015 10:24:14 AM By: [name] KannerBritto, Vita Currin MD, FACS Signed: 08/08/2015 4:56:50 PM By: Elpidio EricAfful, Rita BSN, RN Entered By: [name] KannerBritto, Rajon Bisig on 08/08/2015 10:24:13 [name] [name], [name] [name] ([id number]) [name] [name], [name] [name] ([id number]) -------------------------------------------------------------------------------- SuperBill Details Patient Name: [name] [name], Pershing Date of Service: 08/08/2015 Medical Record Number: [id number] Patient Account Number: [id number] Date of Birth/Sex: 08/13/1985 (29 y.o. Male) Treating RN: [name] MealyAfful, RN, BSN, [place] Sinkita Primary Care Physician: [name] PunchesMiller, Mark Other Clinician: Referring Physician: Bethann PunchesMiller, Mark Treating Physician/Extender: [name] ReBritto, Danica Camarena Weeks in Treatment: 0 Diagnosis  Coding ICD-10 Codes Code Description [id number] Pressure ulcer of left heel, stage 2 Z87.820 Personal history of traumatic brain injury Facility Procedures CPT4 Code: [id number] Description: 99213 - WOUND CARE VISIT-LEV 3 EST PT Modifier: Quantity: 1 Physician Procedures CPT4 Code: [id number] Description: 99214 - WC PHYS LEVEL 4 - EST PT ICD-10 Description Diagnosis L89.622 Pressure ulcer of left heel, stage 2 Z87.820 Personal history of traumatic brain injury Modifier: Quantity: 1 Electronic Signature(s) Signed: 08/08/2015 11:35:20 AM By: [name] KannerBritto, Chondra Boyde MD, FACS Entered By: [name] KannerBritto, Trivia Heffelfinger on 08/08/2015 11:35:19

## 2015-08-10 ENCOUNTER — Ambulatory Visit: Payer: Commercial Managed Care - HMO | Admitting: Speech Pathology

## 2015-08-10 DIAGNOSIS — R471 Dysarthria and anarthria: Secondary | ICD-10-CM | POA: Diagnosis not present

## 2015-08-11 ENCOUNTER — Encounter: Payer: Self-pay | Admitting: Speech Pathology

## 2015-08-11 NOTE — Therapy (Signed)
[place] [facility] [address] [place], [place], [address] Phone: [phone]   Fax:  [phone]  Speech Language Pathology Treatment  Patient Details  Name: [name] [name] [name] MRN: [id number] Date of Birth: 04/04/1986 Referring Provider: Dr. Hyacinth MeekerMiller  Encounter Date: 08/10/2015      End of Session - 08/11/15 1347    Visit Number 4   Number of Visits 8   Date for SLP Re-Evaluation 09/13/15   SLP Start Time 1505   SLP Stop Time  1558   SLP Time Calculation (min) 53 min   Activity Tolerance Patient limited by pain;Other (comment)  Agitation      Past Medical History  Diagnosis Date  . Hepatitis C   . Scoliosis   . TBI (traumatic brain injury) (HCC) 07/2011  . Subarachnoid hemorrhage (HCC)   . Pulmonary emboli (HCC)   . Hepatitis C   . Depression     Past Surgical History  Procedure Laterality Date  . Fracture surgery      left acetabulum  . Ileostomy    . Lung tube    . Ankle surgery Bilateral   . Tracheostomy    . Peg tube placement    . [place] gastrostomy  07/02/2014  . Ivc filter placement ([facility] hx)    . Brain surgery      There were no vitals filed for this visit.  Visit Diagnosis: Dysarthria      Subjective Assessment - 08/11/15 1346    Subjective The patient was more cooperative today but remains easily agitated with poor participation.   Currently in Pain? Yes  Cannot accurately state pain location or level               ADULT SLP TREATMENT - 08/11/15 0001    General Information   Behavior/Cognition Agitated   HPI TBI 07/2011   Treatment Provided   Treatment provided Cognitive-Linquistic   Pain Assessment   Pain Assessment --  Cannot accurately verbalize   Cognitive-Linquistic Treatment   Treatment focused on Dysarthria   Skilled Treatment The patient was less agitated with better participation in therapy.  Patient completed 1-3 reps of 6 of 16 tongue, lip, jaw, and voice exercises.   Caregiver education was provided to the patient's sister.  Review of exercises for the tongue, lips, jaw, and voice with targeted responses identified (eg. promoting range vs. strength). Reviewed potential substitute movements to elicit given targeted response.   Assessment / Recommendations / Plan   Plan Continue with current plan of care   Progression Toward Goals   Progression toward goals Progressing toward goals          SLP Education - 08/11/15 1346    Education provided Yes   Education Details exercises for tongue, lips, jaw, and voice   Person(s) Educated Patient;Other (comment)   Methods Explanation;Demonstration;Verbal cues;Handout   Comprehension Verbalized understanding;Other (comment)  Caregive education            SLP Long Term Goals - 07/12/15 1634    SLP LONG TERM GOAL #1   Title Pt will improve oral motor strength / coordination for speech and swallowing by completing oral motor exercises with assistance from trained caregivers.   Time 6   Period Weeks   Status New   SLP LONG TERM GOAL #2   Title Pt will improve breath support for speech and swallowing by completing breath support exercises with assistance from trained caregivers.   Time 6   Period  Weeks   Status New          Plan - 08/11/15 1348    Clinical Impression Statement The patient was more cooperative today, but only completes 1-3 reps of any given exercise.  Patient's sister in for training, demonstrates good understanding.   Speech Therapy Frequency 1x /week   Duration Other (comment)   Treatment/Interventions Oral motor exercises;Other (comment)  voice exercises   Potential to Achieve Goals Fair   Potential Considerations Ability to learn/carryover information;Cooperation/participation level;Severity of impairments   SLP Home Exercise Plan Tongue, lips, and jaw exercises, voice, breath support exercises   Consulted and Agree with Plan of Care Patient;Family member/caregiver   Family  Member Consulted sister        Problem List Patient Active Problem List   Diagnosis Date Noted  . Colitis, Clostridium difficile 06/19/2015  . Hypoxia 05/25/2015  . Clostridium difficile infection 05/25/2015  . Mucus plugging of bronchi 05/25/2015  . Low grade fever 05/25/2015  . History of pulmonary embolism 05/25/2015  . OSA on CPAP 05/25/2015  . Pressure ulcer 05/25/2015  . Convulsions/seizures (HCC) 02/10/2015  . Aspiration pneumonia (HCC) 02/07/2015  . Encephalopathy, metabolic 02/07/2015  . Acute respiratory failure (HCC) 01/29/2015  . Skin irritation 11/09/2014  . TBI (traumatic brain injury) (HCC) 02/04/2012  . 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 Heterotopic ossification 02/04/2012   [name] Primrose, MS/CCC- SLP  [name] [name] 08/11/2015, 1:50 PM  [place] [facility] MAIN [facility] SERVICES [address] [place], [place], [address] Phone: [phone]   Fax:  [phone]   Name: [name] [name] MRN: [id number] Date of Birth: Apr 09, 1986

## 2015-08-15 ENCOUNTER — Encounter: Payer: 59 | Admitting: Surgery

## 2015-08-15 DIAGNOSIS — L89622 Pressure ulcer of left heel, stage 2: Secondary | ICD-10-CM | POA: Diagnosis not present

## 2015-08-16 NOTE — Progress Notes (Signed)
[name] [name], [name] [name] ([id number]) Visit Report for 08/15/2015 Arrival Information Details Patient Name: [name] [name] Date of Service: 08/15/2015 10:45 AM Medical Record Number: [id number] Patient Account Number: [id number] Date of Birth/Sex: 11-24-1985 (29 y.o. Male) Treating RN: Curtis Sites Primary Care Physician: Bethann Punches Other Clinician: Referring Physician: Bethann Punches Treating Physician/Extender: Rudene Re in Treatment: 1 Visit Information History Since Last Visit Added or deleted any medications: No Patient Arrived: Wheel Chair Any new allergies or adverse reactions: No Arrival Time: 10:46 Had a fall or experienced change in No activities of daily living that may affect Accompanied By: caregiver risk of falls: Transfer Assistance: None Signs or symptoms of abuse/neglect since last No Patient Identification Verified: Yes visito Secondary Verification Process Yes Hospitalized since last visit: No Completed: Pain Present Now: No Patient Requires Transmission-Based No Precautions: Patient Has Alerts: No Electronic Signature(s) Signed: 08/15/2015 5:04:45 PM By: Curtis Sites Entered By: Curtis Sites on 08/15/2015 10:47:14 [name] [name] ([id number]) -------------------------------------------------------------------------------- Clinic Level of Care Assessment Details Patient Name: [name] [name] Date of Service: 08/15/2015 10:45 AM Medical Record Number: [id number] Patient Account Number: [id number] Date of Birth/Sex: 07-14-1986 (29 y.o. Male) Treating RN: Curtis Sites Primary Care Physician: Bethann Punches Other Clinician: Referring Physician: Bethann Punches Treating Physician/Extender: Rudene Re in Treatment: 1 Clinic Level of Care Assessment Items TOOL 4 Quantity Score []  - Use when only an EandM is performed on FOLLOW-UP visit 0 ASSESSMENTS - Nursing Assessment / Reassessment X - Reassessment of Co-morbidities (includes updates in patient status) 1  10 X - Reassessment of Adherence to Treatment Plan 1 5 ASSESSMENTS - Wound and Skin Assessment / Reassessment X - Simple Wound Assessment / Reassessment - one wound 1 5 []  - Complex Wound Assessment / Reassessment - multiple wounds 0 []  - Dermatologic / Skin Assessment (not related to wound area) 0 ASSESSMENTS - Focused Assessment []  - Circumferential Edema Measurements - multi extremities 0 []  - Nutritional Assessment / Counseling / Intervention 0 X - Lower Extremity Assessment (monofilament, tuning fork, pulses) 1 5 []  - Peripheral Arterial Disease Assessment (using hand held doppler) 0 ASSESSMENTS - Ostomy and/or Continence Assessment and Care []  - Incontinence Assessment and Management 0 []  - Ostomy Care Assessment and Management (repouching, etc.) 0 PROCESS - Coordination of Care X - Simple Patient / Family Education for ongoing care 1 15 []  - Complex (extensive) Patient / Family Education for ongoing care 0 []  - Staff obtains Chiropractor, Records, Test Results / Process Orders 0 []  - Staff telephones HHA, Nursing Homes / Clarify orders / etc 0 []  - Routine Transfer to another Facility (non-emergent condition) 0 [name] [name] ([id number]) []  - Routine Hospital Admission (non-emergent condition) 0 []  - New Admissions / Manufacturing engineer / Ordering NPWT, Apligraf, etc. 0 []  - Emergency Hospital Admission (emergent condition) 0 X - Simple Discharge Coordination 1 10 []  - Complex (extensive) Discharge Coordination 0 PROCESS - Special Needs []  - Pediatric / Minor Patient Management 0 []  - Isolation Patient Management 0 []  - Hearing / Language / Visual special needs 0 []  - Assessment of Community assistance (transportation, D/C planning, etc.) 0 []  - Additional assistance / Altered mentation 0 []  - Support Surface(s) Assessment (bed, cushion, seat, etc.) 0 INTERVENTIONS - Wound Cleansing / Measurement X - Simple Wound Cleansing - one wound 1 5 []  - Complex Wound Cleansing -  multiple wounds 0 X - Wound Imaging (photographs - any number of wounds) 1 5 []  - Wound Tracing (instead of photographs) 0 X - Simple Wound Measurement -  one wound 1 5  - Complex Wound Measurement - multiple wounds 0 INTERVENTIONS - Wound Dressings X - Small Wound Dressing one or multiple wounds 1 10  - Medium Wound Dressing one or multiple wounds 0  - Large Wound Dressing one or multiple wounds 0  - Application of Medications - topical 0  - Application of Medications - injection 0 INTERVENTIONS - Miscellaneous  - External ear exam 0 [name] [name] ([id number])  - Specimen Collection (cultures, biopsies, blood, body fluids, etc.) 0  - Specimen(s) / Culture(s) sent or taken to Lab for analysis 0  - Patient Transfer (multiple staff / Michiel Sites Lift / Similar devices) 0  - Simple Staple / Suture removal (25 or less) 0  - Complex Staple / Suture removal (26 or more) 0  - Hypo / Hyperglycemic Management (close monitor of Blood Glucose) 0  - Ankle / Brachial Index (ABI) - do not check if billed separately 0 X - Vital Signs 1 5 Has the patient been seen at the hospital within the last three years: Yes Total Score: 80 Level Of Care: New/Established - Level 3 Electronic Signature(s) Signed: 08/15/2015 2:18:11 PM By: Curtis Sites Entered By: Curtis Sites on 08/15/2015 14:18:10 [name] [name] ([id number]) -------------------------------------------------------------------------------- Encounter Discharge Information Details Patient Name: [name] [name] Date of Service: 08/15/2015 10:45 AM Medical Record Number: [id number] Patient Account Number: [id number] Date of Birth/Sex: 11-13-1985 (29 y.o. Male) Treating RN: Curtis Sites Primary Care Physician: Bethann Punches Other Clinician: Referring Physician: Bethann Punches Treating Physician/Extender: Rudene Re in Treatment: 1 Encounter Discharge Information Items Discharge Pain Level: 0 Discharge Condition:  Stable Ambulatory Status: Wheelchair Discharge Destination: Home Transportation: Private Auto Accompanied By: caregiver Schedule Follow-up Appointment: Yes Medication Reconciliation completed and provided to Patient/Care No Towanda Hornstein: Provided on Clinical Summary of Care: 08/15/2015 Form Type Recipient Paper Patient ZI Electronic Signature(s) Signed: 08/15/2015 11:22:21 AM By: Gwenlyn Perking Entered By: Gwenlyn Perking on 08/15/2015 11:22:21 [name] [name] ([id number]) -------------------------------------------------------------------------------- Lower Extremity Assessment Details Patient Name: [name] [name] Date of Service: 08/15/2015 10:45 AM Medical Record Number: [id number] Patient Account Number: [id number] Date of Birth/Sex: 1986/07/07 (29 y.o. Male) Treating RN: Curtis Sites Primary Care Physician: Bethann Punches Other Clinician: Referring Physician: Bethann Punches Treating Physician/Extender: Rudene Re in Treatment: 1 Edema Assessment Assessed: [Left: No] [Right: No] Edema: [Left: Ye] [Right: s] Vascular Assessment Pulses: Posterior Tibial Dorsalis Pedis Palpable: [Left:Yes] Extremity colors, hair growth, and conditions: Extremity Color: [Left:Normal] Hair Growth on Extremity: [Left:Yes] Temperature of Extremity: [Left:Warm] Capillary Refill: [Left:< 3 seconds] Electronic Signature(s) Signed: 08/15/2015 5:04:45 PM By: Curtis Sites Entered By: Curtis Sites on 08/15/2015 10:53:28 [name] [name] ([id number]) -------------------------------------------------------------------------------- Multi Wound Chart Details Patient Name: [name] [name] Date of Service: 08/15/2015 10:45 AM Medical Record Number: [id number] Patient Account Number: [id number] Date of Birth/Sex: 10-Nov-1985 (29 y.o. Male) Treating RN: Curtis Sites Primary Care Physician: Bethann Punches Other Clinician: Referring Physician: Bethann Punches Treating Physician/Extender: Rudene Re in  Treatment: 1 Vital Signs Height(in): 72 Pulse(bpm): 70 Weight(lbs): 200 Blood Pressure 120/60 (mmHg): Body Mass Index(BMI): 27 Temperature(F): 97.8 Respiratory Rate 16 (breaths/min): Photos: [1:No Photos] [N/A:N/A] Wound Location: [1:Left Calcaneus] [N/A:N/A] Wounding Event: [1:Gradually Appeared] [N/A:N/A] Primary Etiology: [1:Infection - not elsewhere N/A classified] Comorbid History: [1:Hypertension, Hepatitis C, N/A History of pressure wounds, Paraplegia] Date Acquired: [1:06/11/2015] [N/A:N/A] Weeks of Treatment: [1:1] [N/A:N/A] Wound Status: [1:Open] [N/A:N/A] Measurements L x W x D 1.5x0.9x0.1 [N/A:N/A] (cm) Area (cm) : [1:1.06] [N/A:N/A] Volume (cm) : [1:0.106] [N/A:N/A] % Reduction in Area: [1:20.60%] [N/A:N/A] % Reduction in Volume:  20.90% [N/A:N/A] Classification: [1:Full Thickness Without Exposed Support Structures] [N/A:N/A] Exudate Amount: [1:Small] [N/A:N/A] Exudate Type: [1:Serosanguineous] [N/A:N/A] Exudate Color: [1:red, brown] [N/A:N/A] Wound Margin: [1:Distinct, outline attached N/A] Granulation Amount: [1:Small (1-33%)] [N/A:N/A] Granulation Quality: [1:Pale] [N/A:N/A] Necrotic Amount: [1:Large (67-100%)] [N/A:N/A] Necrotic Tissue: [1:Eschar, Adherent Slough N/A] Exposed Structures: [1:Fascia: No Fat: No] [N/A:N/A] Tendon: No Muscle: No Joint: No Bone: No Limited to Skin Breakdown Epithelialization: None N/A N/A Periwound Skin Texture: Edema: Yes N/A N/A Excoriation: No Induration: No Callus: No Crepitus: No Fluctuance: No Friable: No Rash: No Scarring: No Periwound Skin Moist: Yes N/A N/A Moisture: Maceration: No Dry/Scaly: No Periwound Skin Color: Atrophie Blanche: No N/A N/A Cyanosis: No Ecchymosis: No Erythema: No Hemosiderin Staining: No Mottled: No Pallor: No Rubor: No Temperature: No Abnormality N/A N/A Tenderness on Yes N/A N/A Palpation: Wound Preparation: Ulcer Cleansing: N/A N/A Rinsed/Irrigated  with Saline Topical Anesthetic Applied: Other: lidocaine 4% Treatment Notes Electronic Signature(s) Signed: 08/15/2015 5:04:45 PM By: Curtis Sites Entered By: Curtis Sites on 08/15/2015 10:56:47 [name] [name] ([id number]) -------------------------------------------------------------------------------- Multi-Disciplinary Care Plan Details Patient Name: [name] [name] Date of Service: 08/15/2015 10:45 AM Medical Record Number: [id number] Patient Account Number: [id number] Date of Birth/Sex: [birth year] (29 y.o. Male) Treating RN: Curtis Sites Primary Care Physician: Bethann Punches Other Clinician: Referring Physician: Bethann Punches Treating Physician/Extender: Rudene Re in Treatment: 1 Active Inactive Electronic Signature(s) Signed: 08/15/2015 5:04:45 PM By: Curtis Sites Entered By: Curtis Sites on 08/15/2015 10:56:08 [name] [name] ([id number]) -------------------------------------------------------------------------------- Patient/Caregiver Education Details Patient Name: [name] [name] Date of Service: 08/15/2015 10:45 AM Medical Record Number: [id number] Patient Account Number: [id number] Date of Birth/Gender: [birth year] (29 y.o. Male) Treating RN: Curtis Sites Primary Care Physician: Bethann Punches Other Clinician: Referring Physician: Bethann Punches Treating Physician/Extender: Rudene Re in Treatment: 1 Education Assessment Education Provided To: Caregiver Education Topics Provided Wound/Skin Impairment: Handouts: Other: wound care as ordered Methods: Demonstration, Explain/Verbal Responses: State content correctly Electronic Signature(s) Signed: 08/15/2015 5:04:45 PM By: Curtis Sites Entered By: Curtis Sites on 08/15/2015 11:44:27 [name] [name] ([id number]) -------------------------------------------------------------------------------- Wound Assessment Details Patient Name: [name] [name] Date of Service: 08/15/2015 10:45 AM Medical Record  Number: [id number] Patient Account Number: [id number] Date of Birth/Sex: [birth year] (29 y.o. Male) Treating RN: Curtis Sites Primary Care Physician: Bethann Punches Other Clinician: Referring Physician: Bethann Punches Treating Physician/Extender: Rudene Re in Treatment: 1 Wound Status Wound Number: 1 Primary Infection - not elsewhere classified Etiology: Wound Location: Left Calcaneus Wound Open Wounding Event: Gradually Appeared Status: Date Acquired: 06/11/2015 Comorbid Hypertension, Hepatitis C, History of Weeks Of Treatment: 1 History: pressure wounds, Paraplegia Clustered Wound: No Photos Photo Uploaded By: Curtis Sites on 08/15/2015 12:51:05 Wound Measurements Length: (cm) 1.5 Width: (cm) 0.9 Depth: (cm) 0.1 Area: (cm) 1.06 Volume: (cm) 0.106 % Reduction in Area: 20.6% % Reduction in Volume: 20.9% Epithelialization: None Tunneling: No Undermining: No Wound Description Full Thickness Without Exposed Foul Odor Afte Classification: Support Structures Wound Margin: Distinct, outline attached Exudate Small Amount: Exudate Type: Serosanguineous Exudate Color: red, brown r Cleansing: No Wound Bed Granulation Amount: Small (1-33%) Exposed Structure Granulation Quality: Pale Fascia Exposed: No Necrotic Amount: Large (67-100%) Fat Layer Exposed: No Sianez, Earna [name] ([id number]) Necrotic Quality: Eschar, Adherent Slough Tendon Exposed: No Muscle Exposed: No Joint Exposed: No Bone Exposed: No Limited to Skin Breakdown Periwound Skin Texture Texture Color No Abnormalities Noted: No No Abnormalities Noted: No Callus: No Atrophie Blanche: No Crepitus: No Cyanosis: No Excoriation: No Ecchymosis: No Fluctuance: No Erythema: No Friable: No Hemosiderin Staining: No Induration: No Mottled: No Localized  Edema: Yes Pallor: No Rash: No Rubor: No Scarring: No Temperature / Pain Moisture Temperature: No Abnormality No Abnormalities Noted:  No Tenderness on Palpation: Yes Dry / Scaly: No Maceration: No Moist: Yes Wound Preparation Ulcer Cleansing: Rinsed/Irrigated with Saline Topical Anesthetic Applied: Other: lidocaine 4%, Treatment Notes Wound #1 (Left Calcaneus) 1. Cleansed with: Clean wound with Normal Saline 2. Anesthetic Topical Lidocaine 4% cream to wound bed prior to debridement 4. Dressing Applied: Aquacel Ag 5. Secondary Dressing Applied Bordered Foam Dressing Notes sage boot Electronic Signature(s) Signed: 08/15/2015 5:04:45 PM By: Curtis Sites Entered By: Curtis Sites on 08/15/2015 10:55:22 [name] [name] ([id number]) -------------------------------------------------------------------------------- Vitals Details Patient Name: [name] [name] Date of Service: 08/15/2015 10:45 AM Medical Record Number: [id number] Patient Account Number: [id number] Date of Birth/Sex: January 26, 1986 (29 y.o. Male) Treating RN: Curtis Sites Primary Care Physician: Bethann Punches Other Clinician: Referring Physician: Bethann Punches Treating Physician/Extender: Rudene Re in Treatment: 1 Vital Signs Time Taken: 10:48 Temperature (F): 97.8 Height (in): 72 Pulse (bpm): 70 Weight (lbs): 200 Respiratory Rate (breaths/min): 16 Body Mass Index (BMI): 27.1 Blood Pressure (mmHg): 120/60 Reference Range: 80 - 120 mg / dl Electronic Signature(s) Signed: 08/15/2015 5:04:45 PM By: Curtis Sites Entered By: Curtis Sites on 08/15/2015 10:49:48

## 2015-08-16 NOTE — Progress Notes (Signed)
KOY, LAMP ([id number]) Visit Report for 08/15/2015 Chief Complaint Document Details Patient Name: [name] [name], [name] [name] Date of Service: 08/15/2015 10:45 AM Medical Record Number: [id number] Patient Account Number: [id number] Date of Birth/Sex: 09/07/1985 (30 y.o. Male) Treating RN: Curtis Sites Primary Care Physician: Bethann Punches Other Clinician: Referring Physician: Bethann Punches Treating Physician/Extender: Rudene Re in Treatment: 1 Information Obtained from: Caregiver Chief Complaint Patient is at the clinic for treatment of an open pressure ulcer to his left heel which she's had for about 4-5 months Electronic Signature(s) Signed: 08/15/2015 11:45:42 AM By: Evlyn Kanner MD, FACS Entered By: Evlyn Kanner on 08/15/2015 11:45:42 [name] [name] ([id number]) -------------------------------------------------------------------------------- HPI Details Patient Name: [name] [name] Date of Service: 08/15/2015 10:45 AM Medical Record Number: [id number] Patient Account Number: [id number] Date of Birth/Sex: 09-09-1985 (30 y.o. Male) Treating RN: Curtis Sites Primary Care Physician: Bethann Punches Other Clinician: Referring Physician: Bethann Punches Treating Physician/Extender: Rudene Re in Treatment: 1 History of Present Illness Location: left heel Quality: Patient reports No Pain. Severity: Patient states wound are getting worse. Duration: Patient has had the wound for > 3 months prior to seeking treatment at the wound center Context: The wound appeared gradually over time Modifying Factors: Other treatment(s) tried include:local care with some silver and zinc paste HPI Description: 30 year old male who has a history of traumatic brain injury and also has a ulcer on his left heel which was noted by the family about 4-5 months ago. The TBI was in January 2013 and prior to this visit the patient has had hyperbaric oxygen therapy approximately 2 years after his TBI. He is had  several gastrostomy tube placed by the gastroenterologist but then finally had a surgical procedure to bring the gastric mucosa to the skin surface. This was done approximately 9 months ago. Past medical history of hepatitis C, traumatic brain injury, pulmonary emboli, ileostomy, tracheostomy, PEG tube placement. Was a former smoker and quit in January 2013 Electronic Signature(s) Signed: 08/15/2015 11:45:46 AM By: Evlyn Kanner MD, FACS Entered By: Evlyn Kanner on 08/15/2015 11:45:46 [name] [name] ([id number]) -------------------------------------------------------------------------------- Physical Exam Details Patient Name: [name] [name] Date of Service: 08/15/2015 10:45 AM Medical Record Number: [id number] Patient Account Number: [id number] Date of Birth/Sex: 1986-07-05 (30 y.o. Male) Treating RN: Curtis Sites Primary Care Physician: Bethann Punches Other Clinician: Referring Physician: Bethann Punches Treating Physician/Extender: Rudene Re in Treatment: 1 Constitutional . Pulse regular. Respirations normal and unlabored. Afebrile. . Eyes Nonicteric. Reactive to light. Ears, Nose, Mouth, and Throat Lips, teeth, and gums WNL.Marland Kitchen Moist mucosa without lesions. Neck supple and nontender. No palpable supraclavicular or cervical adenopathy. Normal sized without goiter. Respiratory WNL. No retractions.. Cardiovascular Pedal Pulses WNL. No clubbing, cyanosis or edema. Lymphatic No adneopathy. No adenopathy. No adenopathy. Musculoskeletal Adexa without tenderness or enlargement.. Digits and nails w/o clubbing, cyanosis, infection, petechiae, ischemia, or inflammatory conditions.. Integumentary (Hair, Skin) No suspicious lesions. No crepitus or fluctuance. No peri-wound warmth or erythema. No masses.Marland Kitchen Psychiatric Judgement and insight Intact.. No evidence of depression, anxiety, or agitation.. Notes the stage II pressure injury on the left heel was debrided gently with moist saline  gauze and the patient still has a lot of tenderness Electronic Signature(s) Signed: 08/15/2015 11:46:16 AM By: Evlyn Kanner MD, FACS Entered By: Evlyn Kanner on 08/15/2015 11:46:15 [name] [name] ([id number]) -------------------------------------------------------------------------------- Physician Orders Details Patient Name: [name] [name] Date of Service: 08/15/2015 10:45 AM Medical Record Number: [id number] Patient Account Number: [id number] Date of Birth/Sex: October 23, 1985 (30 y.o. Male) Treating RN: Curtis Sites Primary Care Physician: Bethann Punches  Other Clinician: Referring Physician: Bethann Punches Treating Physician/Extender: Rudene Re in Treatment: 1 Verbal / Phone Orders: Yes Clinician: Curtis Sites Read Back and Verified: Yes Diagnosis Coding Wound Cleansing Wound #1 Left Calcaneus o Clean wound with Normal Saline. Skin Barriers/Peri-Wound Care Wound #1 Left Calcaneus o Skin Prep Primary Wound Dressing Wound #1 Left Calcaneus o Aquacel Ag Secondary Dressing Wound #1 Left Calcaneus o Boardered Foam Dressing Dressing Change Frequency Wound #1 Left Calcaneus o Change dressing every other day. - and as needed Follow-up Appointments Wound #1 Left Calcaneus o Return Appointment in 1 week. Off-Loading Wound #1 Left Calcaneus o Heel suspension boot to: - SAGE boots Additional Orders / Instructions Wound #1 Left Calcaneus o Increase protein intake. o Other: - ZINC, MVI, Vitamin C Home Health GLENN, GULLICKSON ([id number]) Wound #1 Left Calcaneus o Continue Home Health Visits - advanced home health o Home Health Nurse may visit PRN to address patientos wound care needs. o FACE TO FACE ENCOUNTER: MEDICARE and MEDICAID PATIENTS: I certify that this patient is under my care and that I had a face-to-face encounter that meets the physician face-to-face encounter requirements with this patient on this date. The encounter with the patient was  in whole or in part for the following MEDICAL CONDITION: (primary reason for Home Healthcare) MEDICAL NECESSITY: I certify, that based on my findings, NURSING services are a medically necessary home health service. HOME BOUND STATUS: I certify that my clinical findings support that this patient is homebound (i.e., Due to illness or injury, pt requires aid of supportive devices such as crutches, cane, wheelchairs, walkers, the use of special transportation or the assistance of another person to leave their place of residence. There is a normal inability to leave the home and doing so requires considerable and taxing effort. Other absences are for medical reasons / religious services and are infrequent or of short duration when for other reasons). o If current dressing causes regression in wound condition, may D/C ordered dressing product/s and apply Normal Saline Moist Dressing daily until next Wound Healing Center / Other MD appointment. Notify Wound Healing Center of regression in wound condition at [phone]. Electronic Signature(s) Signed: 08/15/2015 3:35:32 PM By: Evlyn Kanner MD, FACS Signed: 08/15/2015 5:04:45 PM By: Curtis Sites Entered By: Curtis Sites on 08/15/2015 11:15:18 [name] [name] ([id number]) -------------------------------------------------------------------------------- Problem List Details Patient Name: [name] [name] Date of Service: 08/15/2015 10:45 AM Medical Record Number: [id number] Patient Account Number: [id number] Date of Birth/Sex: July 10, 1986 (30 y.o. Male) Treating RN: Curtis Sites Primary Care Physician: Bethann Punches Other Clinician: Referring Physician: Bethann Punches Treating Physician/Extender: Rudene Re in Treatment: 1 Active Problems ICD-10 Encounter Code Description Active Date Diagnosis [phone] Pressure ulcer of left heel, stage 2 08/08/2015 Yes Z87.820 Personal history of traumatic brain injury 08/08/2015 Yes Inactive  Problems Resolved Problems Electronic Signature(s) Signed: 08/15/2015 11:45:36 AM By: Evlyn Kanner MD, FACS Entered By: Evlyn Kanner on 08/15/2015 11:45:36 [name] [name] ([id number]) -------------------------------------------------------------------------------- Progress Note Details Patient Name: [name] [name] Date of Service: 08/15/2015 10:45 AM Medical Record Number: [id number] Patient Account Number: [id number] Date of Birth/Sex: Mar 08, 1986 (30 y.o. Male) Treating RN: Curtis Sites Primary Care Physician: Bethann Punches Other Clinician: Referring Physician: Bethann Punches Treating Physician/Extender: Rudene Re in Treatment: 1 Subjective Chief Complaint Information obtained from Caregiver Patient is at the clinic for treatment of an open pressure ulcer to his left heel which she's had for about 4-5 months History of Present Illness (HPI) The following HPI elements were documented for the patient's wound:  Location: left heel Quality: Patient reports No Pain. Severity: Patient states wound are getting worse. Duration: Patient has had the wound for > 3 months prior to seeking treatment at the wound center Context: The wound appeared gradually over time Modifying Factors: Other treatment(s) tried include:local care with some silver and zinc paste 30 year old male who has a history of traumatic brain injury and also has a ulcer on his left heel which was noted by the family about 4-5 months ago. The TBI was in January 2013 and prior to this visit the patient has had hyperbaric oxygen therapy approximately 2 years after his TBI. He is had several gastrostomy tube placed by the gastroenterologist but then finally had a surgical procedure to bring the gastric mucosa to the skin surface. This was done approximately 9 months ago. Past medical history of hepatitis C, traumatic brain injury, pulmonary emboli, ileostomy, tracheostomy, PEG tube placement. Was a former smoker and quit  in January 2013 Objective Constitutional Pulse regular. Respirations normal and unlabored. Afebrile. Vitals Time Taken: 10:48 AM, Height: 72 in, Weight: 200 lbs, BMI: 27.1, Temperature: 97.8 F, Pulse: 70 bpm, Respiratory Rate: 16 breaths/min, Blood Pressure: 120/60 mmHg. Eyes VANDY, TSUCHIYA ([id number]) Nonicteric. Reactive to light. Ears, Nose, Mouth, and Throat Lips, teeth, and gums WNL.Marland Kitchen Moist mucosa without lesions. Neck supple and nontender. No palpable supraclavicular or cervical adenopathy. Normal sized without goiter. Respiratory WNL. No retractions.. Cardiovascular Pedal Pulses WNL. No clubbing, cyanosis or edema. Lymphatic No adneopathy. No adenopathy. No adenopathy. Musculoskeletal Adexa without tenderness or enlargement.. Digits and nails w/o clubbing, cyanosis, infection, petechiae, ischemia, or inflammatory conditions.Marland Kitchen Psychiatric Judgement and insight Intact.. No evidence of depression, anxiety, or agitation.. General Notes: the stage II pressure injury on the left heel was debrided gently with moist saline gauze and the patient still has a lot of tenderness Integumentary (Hair, Skin) No suspicious lesions. No crepitus or fluctuance. No peri-wound warmth or erythema. No masses.. Wound #1 status is Open. Original cause of wound was Gradually Appeared. The wound is located on the Left Calcaneus. The wound measures 1.5cm length x 0.9cm width x 0.1cm depth; 1.06cm^2 area and 0.106cm^3 volume. The wound is limited to skin breakdown. There is no tunneling or undermining noted. There is a small amount of serosanguineous drainage noted. The wound margin is distinct with the outline attached to the wound base. There is small (1-33%) pale granulation within the wound bed. There is a large (67-100%) amount of necrotic tissue within the wound bed including Eschar and Adherent Slough. The periwound skin appearance exhibited: Localized Edema, Moist. The periwound skin appearance  did not exhibit: Callus, Crepitus, Excoriation, Fluctuance, Friable, Induration, Rash, Scarring, Dry/Scaly, Maceration, Atrophie Blanche, Cyanosis, Ecchymosis, Hemosiderin Staining, Mottled, Pallor, Rubor, Erythema. Periwound temperature was noted as No Abnormality. The periwound has tenderness on palpation. Assessment Active Problems FURQAN, GOSSELIN ([id number]) ICD-10 [phone] - Pressure ulcer of left heel, stage 2 Z87.820 - Personal history of traumatic brain injury Plan Wound Cleansing: Wound #1 Left Calcaneus: Clean wound with Normal Saline. Skin Barriers/Peri-Wound Care: Wound #1 Left Calcaneus: Skin Prep Primary Wound Dressing: Wound #1 Left Calcaneus: Aquacel Ag Secondary Dressing: Wound #1 Left Calcaneus: Boardered Foam Dressing Dressing Change Frequency: Wound #1 Left Calcaneus: Change dressing every other day. - and as needed Follow-up Appointments: Wound #1 Left Calcaneus: Return Appointment in 1 week. Off-Loading: Wound #1 Left Calcaneus: Heel suspension boot to: - SAGE boots Additional Orders / Instructions: Wound #1 Left Calcaneus: Increase protein intake. Other: - ZINC, MVI, Vitamin C Home  Health: Wound #1 Left Calcaneus: Continue Home Health Visits - advanced home health Home Health Nurse may visit PRN to address patient s wound care needs. FACE TO FACE ENCOUNTER: MEDICARE and MEDICAID PATIENTS: I certify that this patient is under my care and that I had a face-to-face encounter that meets the physician face-to-face encounter requirements with this patient on this date. The encounter with the patient was in whole or in part for the following MEDICAL CONDITION: (primary reason for Home Healthcare) MEDICAL NECESSITY: I certify, that based on my findings, NURSING services are a medically necessary home health service. HOME BOUND STATUS: I certify that my clinical findings support that this patient is homebound (i.e., Due to illness or injury, pt requires aid  of supportive devices such as crutches, cane, wheelchairs, walkers, the use of special transportation or the assistance of another person to leave their place of residence. There is a normal inability to leave the home and doing so requires considerable and taxing effort. Other absences are for medical reasons / religious services and are infrequent or of short duration when for other reasons). JATHNIEL, SMELTZER ([id number]) If current dressing causes regression in wound condition, may D/C ordered dressing product/s and apply Normal Saline Moist Dressing daily until next Wound Healing Center / Other MD appointment. Notify Wound Healing Center of regression in wound condition at [phone]. I have recommended wound care with Aquacel Ag and a heel protector. They also using [employer], appropriate Air mattress and offloading appropriately. Have discussed increase protein intake, multivitamins, vitamin C and zinc supplements. Electronic Signature(s) Signed: 08/15/2015 11:46:56 AM By: Evlyn Kanner MD, FACS Entered By: Evlyn Kanner on 08/15/2015 11:46:56 [name] [name] ([id number]) -------------------------------------------------------------------------------- SuperBill Details Patient Name: [name] [name] Date of Service: 08/15/2015 Medical Record Number: [id number] Patient Account Number: [id number] Date of Birth/Sex: 1986/02/03 (30 y.o. Male) Treating RN: Curtis Sites Primary Care Physician: Bethann Punches Other Clinician: Referring Physician: Bethann Punches Treating Physician/Extender: Rudene Re in Treatment: 1 Diagnosis Coding ICD-10 Codes Code Description [id number] Pressure ulcer of left heel, stage 2 Z87.820 Personal history of traumatic brain injury Facility Procedures CPT4 Code: [id number] Description: 99213 - WOUND CARE VISIT-LEV 3 EST PT Modifier: Quantity: 1 Physician Procedures CPT4 Code: [id number] Description: 99213 - WC PHYS LEVEL 3 - EST PT ICD-10 Description Diagnosis  L89.622 Pressure ulcer of left heel, stage 2 Z87.820 Personal history of traumatic brain injury Modifier: Quantity: 1 Electronic Signature(s) Signed: 08/15/2015 2:18:21 PM By: Curtis Sites Signed: 08/15/2015 3:35:32 PM By: Evlyn Kanner MD, FACS Previous Signature: 08/15/2015 11:47:07 AM Version By: Evlyn Kanner MD, FACS Entered By: Curtis Sites on 08/15/2015 14:18:21

## 2015-08-17 ENCOUNTER — Encounter: Payer: Self-pay | Admitting: Speech Pathology

## 2015-08-17 ENCOUNTER — Ambulatory Visit: Payer: Commercial Managed Care - HMO | Admitting: Speech Pathology

## 2015-08-17 DIAGNOSIS — R471 Dysarthria and anarthria: Secondary | ICD-10-CM

## 2015-08-17 NOTE — Therapy (Signed)
[facility] MAIN [facility] SERVICES [address] [place], [place], [address] Phone: [phone]   Fax:  [phone]  Speech Language Pathology Treatment  Patient Details  Name: [name] [name] MRN: [id number] Date of Birth: [birth year] Referring Provider: Dr. Hyacinth Meeker  Encounter Date: 08/17/2015      End of Session - 08/17/15 1643    Visit Number 5   Number of Visits 8   Date for SLP Re-Evaluation 09/13/15   SLP Start Time 1513   SLP Stop Time  1554   SLP Time Calculation (min) 41 min   Activity Tolerance Patient tolerated treatment well      Past Medical History  Diagnosis Date  . Hepatitis C   . Scoliosis   . TBI (traumatic brain injury) (HCC) 07/2011  . Subarachnoid hemorrhage (HCC)   . Pulmonary emboli (HCC)   . Hepatitis C   . Depression     Past Surgical History  Procedure Laterality Date  . Fracture surgery      left acetabulum  . Ileostomy    . Lung tube    . Ankle surgery Bilateral   . Tracheostomy    . Peg tube placement    . [place] gastrostomy  07/02/2014  . Ivc filter placement ([facility] hx)    . Brain surgery      There were no vitals filed for this visit.  Visit Diagnosis: Dysarthria      Subjective Assessment - 08/17/15 1642    Subjective The patient was much more cooperative today with better effort.    Currently in Pain? No/denies               ADULT SLP TREATMENT - 08/17/15 0001    General Information   Behavior/Cognition Cooperative   HPI TBI 07/2011   Treatment Provided   Treatment provided Cognitive-Linquistic   Pain Assessment   Pain Assessment --  Cannot accurately state level and location of pain   Cognitive-Linquistic Treatment   Treatment focused on Dysarthria   Skilled Treatment The patient was much less agitated with significantly better participation in therapy.  Patient completed 5 reps of 16 of 16 tongue, lip, jaw, and voice exercises.  Caregiver education was provided to the patient's  sister.  Review of exercises for the tongue, lips, jaw, and voice with targeted responses identified (eg. promoting range vs. strength). Reviewed potential substitute movements to elicit given targeted response.   Assessment / Recommendations / Plan   Plan Continue with current plan of care   Progression Toward Goals   Progression toward goals Progressing toward goals          SLP Education - 08/17/15 1642    Education provided Yes   Education Details exercises for the tongue, lips, jaw, and voice   Person(s) Educated Patient;Other (comment)   Methods Explanation;Demonstration;Verbal cues;Handout   Comprehension Verbalized understanding;Returned demonstration;Verbal cues required;Tactile cues required;Need further instruction            SLP Long Term Goals - 07/12/15 1634    SLP LONG TERM GOAL #1   Title Pt will improve oral motor strength / coordination for speech and swallowing by completing oral motor exercises with assistance from trained caregivers.   Time 6   Period Weeks   Status New   SLP LONG TERM GOAL #2   Title Pt will improve breath support for speech and swallowing by completing breath support exercises with assistance from trained caregivers.   Time 6   Period Weeks  Status New          Plan - 08/17/15 1644    Clinical Impression Statement The patient was more cooperative today and completed all exercises given max encouragement from SLP and sister.   Speech Therapy Frequency 1x /week   Duration Other (comment)   Treatment/Interventions Oral motor exercises;Other (comment)  Voice exercises   Potential to Achieve Goals Fair   Potential Considerations Ability to learn/carryover information;Cooperation/participation level;Severity of impairments   SLP Home Exercise Plan Tongue, lips, and jaw exercises, voice, breath support exercises   Consulted and Agree with Plan of Care Patient;Family member/caregiver   Family Member Consulted sister         Problem List Patient Active Problem List   Diagnosis Date Noted  . Colitis, Clostridium difficile 06/19/2015  . Hypoxia 05/25/2015  . Clostridium difficile infection 05/25/2015  . Mucus plugging of bronchi 05/25/2015  . Low grade fever 05/25/2015  . History of pulmonary embolism 05/25/2015  . OSA on CPAP 05/25/2015  . Pressure ulcer 05/25/2015  . Convulsions/seizures (HCC) 02/10/2015  . Aspiration pneumonia (HCC) 02/07/2015  . Encephalopathy, metabolic 02/07/2015  . Acute respiratory failure (HCC) 01/29/2015  . Skin irritation 11/09/2014  . TBI (traumatic brain injury) (HCC) 02/04/2012  . 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 Heterotopic ossification 02/04/2012   Dollene Primrose, MS/CCC- SLP  Leandrew Koyanagi 08/17/2015, 4:45 PM  [facility] MAIN [facility] SERVICES [address] [place], [place], [address] Phone: [phone]   Fax:  [phone]   Name: [name] [name] MRN: [id number] Date of Birth: [birth year]

## 2015-08-22 ENCOUNTER — Encounter: Payer: 59 | Admitting: Surgery

## 2015-08-22 DIAGNOSIS — L89622 Pressure ulcer of left heel, stage 2: Secondary | ICD-10-CM | POA: Diagnosis not present

## 2015-08-22 NOTE — Progress Notes (Addendum)
ROI, JAFARI ([id number]) Visit Report for 08/22/2015 Chief Complaint Document Details Patient Name: [name] [name], [name] [name] Date of Service: 08/22/2015 10:45 AM Medical Record Number: [id number] Patient Account Number: [id number] Date of Birth/Sex: 1986-06-21 (30 y.o. Male) Treating RN: Phillis Haggis Primary Care Physician: Bethann Punches Other Clinician: Referring Physician: Bethann Punches Treating Physician/Extender: Rudene Re in Treatment: 2 Information Obtained from: Caregiver Chief Complaint Patient is at the clinic for treatment of an open pressure ulcer to his left heel which she's had for about 4-5 months Electronic Signature(s) Signed: 08/22/2015 11:28:13 AM By: Evlyn Kanner MD, FACS Entered By: Evlyn Kanner on 08/22/2015 11:28:13 [name] [name] ([id number]) -------------------------------------------------------------------------------- HPI Details Patient Name: [name] [name] Date of Service: 08/22/2015 10:45 AM Medical Record Number: [id number] Patient Account Number: [id number] Date of Birth/Sex: 07-Oct-1985 (30 y.o. Male) Treating RN: Phillis Haggis Primary Care Physician: Bethann Punches Other Clinician: Referring Physician: Bethann Punches Treating Physician/Extender: Rudene Re in Treatment: 2 History of Present Illness Location: left heel Quality: Patient reports No Pain. Severity: Patient states wound are getting worse. Duration: Patient has had the wound for > 3 months prior to seeking treatment at the wound center Context: The wound appeared gradually over time Modifying Factors: Other treatment(s) tried include:local care with some silver and zinc paste HPI Description: 30 year old male who has a history of traumatic brain injury and also has a ulcer on his left heel which was noted by the family about 4-5 months ago. The TBI was in January 2013 and prior to this visit the patient has had hyperbaric oxygen therapy approximately 2 years after his TBI. He is had  several gastrostomy tube placed by the gastroenterologist but then finally had a surgical procedure to bring the gastric mucosa to the skin surface. This was done approximately 9 months ago. Past medical history of hepatitis C, traumatic brain injury, pulmonary emboli, ileostomy, tracheostomy, PEG tube placement. Was a former smoker and quit in January 2013 Electronic Signature(s) Signed: 08/22/2015 11:28:19 AM By: Evlyn Kanner MD, FACS Entered By: Evlyn Kanner on 08/22/2015 11:28:19 [name] [name] ([id number]) -------------------------------------------------------------------------------- Physical Exam Details Patient Name: [name] [name] Date of Service: 08/22/2015 10:45 AM Medical Record Number: [id number] Patient Account Number: [id number] Date of Birth/Sex: 11/15/1985 (30 y.o. Male) Treating RN: Phillis Haggis Primary Care Physician: Bethann Punches Other Clinician: Referring Physician: Bethann Punches Treating Physician/Extender: Rudene Re in Treatment: 2 Constitutional . Pulse regular. Respirations normal and unlabored. Afebrile. . Eyes Nonicteric. Reactive to light. Ears, Nose, Mouth, and Throat Lips, teeth, and gums WNL.Marland Kitchen Moist mucosa without lesions. Neck supple and nontender. No palpable supraclavicular or cervical adenopathy. Normal sized without goiter. Respiratory WNL. No retractions.. Cardiovascular Pedal Pulses WNL. No clubbing, cyanosis or edema. Lymphatic No adneopathy. No adenopathy. No adenopathy. Musculoskeletal Adexa without tenderness or enlargement.. Digits and nails w/o clubbing, cyanosis, infection, petechiae, ischemia, or inflammatory conditions.. Integumentary (Hair, Skin) No suspicious lesions. No crepitus or fluctuance. No peri-wound warmth or erythema. No masses.Marland Kitchen Psychiatric Judgement and insight Intact.. No evidence of depression, anxiety, or agitation.. Notes the ulceration on the left heel has some subcutaneous debris which cannot be  debrided because of tenderness. We will use Santyl ointment locally Electronic Signature(s) Signed: 08/22/2015 11:29:08 AM By: Evlyn Kanner MD, FACS Entered By: Evlyn Kanner on 08/22/2015 11:29:08 [name] [name] ([id number]) -------------------------------------------------------------------------------- Physician Orders Details Patient Name: [name] [name] Date of Service: 08/22/2015 10:45 AM Medical Record Number: [id number] Patient Account Number: [id number] Date of Birth/Sex: July 06, 1986 (30 y.o. Male) Treating RN: Phillis Haggis Primary Care Physician: Bethann Punches Other Clinician:  Referring Physician: Bethann Punches Treating Physician/Extender: Rudene Re in Treatment: 2 Verbal / Phone Orders: Yes Clinician: Pinkerton, Debi Read Back and Verified: Yes Diagnosis Coding Wound Cleansing Wound #1 Left Calcaneus o Clean wound with Normal Saline. Anesthetic Wound #1 Left Calcaneus o Topical Lidocaine 4% cream applied to wound bed prior to debridement Primary Wound Dressing Wound #1 Left Calcaneus o Santyl Ointment Secondary Dressing Wound #1 Left Calcaneus o Boardered Foam Dressing Dressing Change Frequency Wound #1 Left Calcaneus o Change dressing every day. Follow-up Appointments Wound #1 Left Calcaneus o Return Appointment in 1 week. Home Health Wound #1 Left Calcaneus o Continue Home Health Visits o Home Health Nurse may visit PRN to address patientos wound care needs. o FACE TO FACE ENCOUNTER: MEDICARE and MEDICAID PATIENTS: I certify that this patient is under my care and that I had a face-to-face encounter that meets the physician face-to-face encounter requirements with this patient on this date. The encounter with the patient was in whole or in part for the following MEDICAL CONDITION: (primary reason for Home Healthcare) MEDICAL NECESSITY: I certify, that based on my findings, NURSING services are a medically necessary home health service.  HOME BOUND STATUS: I certify that my clinical findings support that this patient is homebound (i.e., Due to illness or injury, pt requires aid of CORRAN, LALONE ([id number]) supportive devices such as crutches, cane, wheelchairs, walkers, the use of special transportation or the assistance of another person to leave their place of residence. There is a normal inability to leave the home and doing so requires considerable and taxing effort. Other absences are for medical reasons / religious services and are infrequent or of short duration when for other reasons). o If current dressing causes regression in wound condition, may D/C ordered dressing product/s and apply Normal Saline Moist Dressing daily until next Wound Healing Center / Other MD appointment. Notify Wound Healing Center of regression in wound condition at [phone]. o Please direct any NON-WOUND related issues/requests for orders to patient's Primary Care Physician Medications-please add to medication list. Wound #1 Left Calcaneus o Santyl Enzymatic Ointment Patient Medications Allergies: Ambien, ativan, Depakote, dilauded, Keppra Notifications Medication Indication Start End Santyl 08/22/2015 DOSE topical 250 unit/gram ointment - ointment topical to the left heel as prescribed Electronic Signature(s) Signed: 08/23/2015 4:12:51 PM By: Evlyn Kanner MD, FACS Signed: 08/23/2015 5:42:28 PM By: Alejandro Mulling Previous Signature: 08/22/2015 2:32:13 PM Version By: Evlyn Kanner MD, FACS Previous Signature: 08/22/2015 3:03:16 PM Version By: Alejandro Mulling Previous Signature: 08/22/2015 11:27:46 AM Version By: Evlyn Kanner MD, FACS Entered By: Alejandro Mulling on 08/22/2015 15:17:34 [name] [name] ([id number]) -------------------------------------------------------------------------------- Problem List Details Patient Name: [name] [name] Date of Service: 08/22/2015 10:45 AM Medical Record Number: [id number] Patient Account  Number: [id number] Date of Birth/Sex: 06-Jan-1986 (30 y.o. Male) Treating RN: Phillis Haggis Primary Care Physician: Bethann Punches Other Clinician: Referring Physician: Bethann Punches Treating Physician/Extender: Rudene Re in Treatment: 2 Active Problems ICD-10 Encounter Code Description Active Date Diagnosis [phone] Pressure ulcer of left heel, stage 2 08/08/2015 Yes Z87.820 Personal history of traumatic brain injury 08/08/2015 Yes Inactive Problems Resolved Problems Electronic Signature(s) Signed: 08/22/2015 11:28:05 AM By: Evlyn Kanner MD, FACS Entered By: Evlyn Kanner on 08/22/2015 11:28:05 [name] [name] ([id number]) -------------------------------------------------------------------------------- Progress Note Details Patient Name: [name] [name] Date of Service: 08/22/2015 10:45 AM Medical Record Number: [id number] Patient Account Number: [id number] Date of Birth/Sex: Jan 25, 1986 (30 y.o. Male) Treating RN: Phillis Haggis Primary Care Physician: Bethann Punches Other Clinician: Referring Physician: Bethann Punches Treating Physician/Extender:  Jaishaun Mcnab Weeks in Treatment: 2 Subjective Chief Complaint Information obtained from Caregiver Patient is at the clinic for treatment of an open pressure ulcer to his left heel which she's had for about 4-5 months History of Present Illness (HPI) The following HPI elements were documented for the patient's wound: Location: left heel Quality: Patient reports No Pain. Severity: Patient states wound are getting worse. Duration: Patient has had the wound for > 3 months prior to seeking treatment at the wound center Context: The wound appeared gradually over time Modifying Factors: Other treatment(s) tried include:local care with some silver and zinc paste 30 year old male who has a history of traumatic brain injury and also has a ulcer on his left heel which was noted by the family about 4-5 months ago. The TBI was in January 2013 and  prior to this visit the patient has had hyperbaric oxygen therapy approximately 2 years after his TBI. He is had several gastrostomy tube placed by the gastroenterologist but then finally had a surgical procedure to bring the gastric mucosa to the skin surface. This was done approximately 9 months ago. Past medical history of hepatitis C, traumatic brain injury, pulmonary emboli, ileostomy, tracheostomy, PEG tube placement. Was a former smoker and quit in January 2013 Objective Constitutional Pulse regular. Respirations normal and unlabored. Afebrile. Vitals Time Taken: 11:07 AM, Height: 72 in, Weight: 200 lbs, BMI: 27.1, Temperature: 97.5 F, Pulse: 72 bpm, Respiratory Rate: 18 breaths/min, Blood Pressure: 130/63 mmHg. Eyes [name] [name], [name] [name] ([id number]) Nonicteric. Reactive to light. Ears, Nose, Mouth, and Throat Lips, teeth, and gums WNL.Marland Kitchen Moist mucosa without lesions. Neck supple and nontender. No palpable supraclavicular or cervical adenopathy. Normal sized without goiter. Respiratory WNL. No retractions.. Cardiovascular Pedal Pulses WNL. No clubbing, cyanosis or edema. Lymphatic No adneopathy. No adenopathy. No adenopathy. Musculoskeletal Adexa without tenderness or enlargement.. Digits and nails w/o clubbing, cyanosis, infection, petechiae, ischemia, or inflammatory conditions.Marland Kitchen Psychiatric Judgement and insight Intact.. No evidence of depression, anxiety, or agitation.. General Notes: the ulceration on the left heel has some subcutaneous debris which cannot be debrided because of tenderness. We will use Santyl ointment locally Integumentary (Hair, Skin) No suspicious lesions. No crepitus or fluctuance. No peri-wound warmth or erythema. No masses.. Wound #1 status is Open. Original cause of wound was Gradually Appeared. The wound is located on the Left Calcaneus. The wound measures 1.5cm length x 0.8cm width x 0.1cm depth; 0.942cm^2 area and 0.094cm^3 volume. The wound is  limited to skin breakdown. There is no tunneling or undermining noted. There is a medium amount of serosanguineous drainage noted. The wound margin is distinct with the outline attached to the wound base. There is small (1-33%) pale granulation within the wound bed. There is a large (67-100%) amount of necrotic tissue within the wound bed including Eschar and Adherent Slough. The periwound skin appearance exhibited: Localized Edema, Moist. The periwound skin appearance did not exhibit: Callus, Crepitus, Excoriation, Fluctuance, Friable, Induration, Rash, Scarring, Dry/Scaly, Maceration, Atrophie Blanche, Cyanosis, Ecchymosis, Hemosiderin Staining, Mottled, Pallor, Rubor, Erythema. Periwound temperature was noted as No Abnormality. The periwound has tenderness on palpation. Assessment Active Problems [name] [name], [name] [name] ([id number]) ICD-10 [phone] - Pressure ulcer of left heel, stage 2 Z87.820 - Personal history of traumatic brain injury I have recommended wound care with Santyl ointment and a heel protector. If his insurance doesn't cover this we will use [employer]. They also using [employer], appropriate Air mattress and offloading appropriately. Have discussed increase protein intake, multivitamins, vitamin C and zinc supplements. Plan Wound Cleansing:  Wound #1 Left Calcaneus: Clean wound with Normal Saline. Anesthetic: Wound #1 Left Calcaneus: Topical Lidocaine 4% cream applied to wound bed prior to debridement Primary Wound Dressing: Wound #1 Left Calcaneus: Santyl Ointment Secondary Dressing: Wound #1 Left Calcaneus: Boardered Foam Dressing Dressing Change Frequency: Wound #1 Left Calcaneus: Change dressing every day. Follow-up Appointments: Wound #1 Left Calcaneus: Return Appointment in 1 week. Home Health: Wound #1 Left Calcaneus: Continue Home Health Visits Home Health Nurse may visit PRN to address patient s wound care needs. FACE TO FACE ENCOUNTER: MEDICARE and MEDICAID  PATIENTS: I certify that this patient is under my care and that I had a face-to-face encounter that meets the physician face-to-face encounter requirements with this patient on this date. The encounter with the patient was in whole or in part for the following MEDICAL CONDITION: (primary reason for Home Healthcare) MEDICAL NECESSITY: I certify, that based on my findings, NURSING services are a medically necessary home health service. HOME BOUND STATUS: I certify that my clinical findings support that this patient is homebound (i.e., Due to illness or injury, pt requires aid of supportive devices such as crutches, cane, wheelchairs, walkers, the use of special transportation or the assistance of another person to leave their place of residence. There is a normal inability to leave the home and doing so requires considerable and taxing effort. Other absences are for medical reasons / religious services and are infrequent or of short duration when for other reasons). [name] [name], [name] [name] ([id number]) If current dressing causes regression in wound condition, may D/C ordered dressing product/s and apply Normal Saline Moist Dressing daily until next Wound Healing Center / Other MD appointment. Notify Wound Healing Center of regression in wound condition at [phone]. Please direct any NON-WOUND related issues/requests for orders to patient's Primary Care Physician Medications-please add to medication list.: Wound #1 Left Calcaneus: Santyl Enzymatic Ointment The following medication(s) was prescribed: Santyl topical 250 unit/gram ointment ointment topical to the left heel as prescribed starting 08/22/2015 I have recommended wound care with Santyl ointment and a heel protector. If his insurance doesn't cover this we will use [employer]. They also using [employer], appropriate Air mattress and offloading appropriately. Have discussed increase protein intake, multivitamins, vitamin C and zinc  supplements. Electronic Signature(s) Signed: 08/23/2015 4:14:59 PM By: Evlyn Kanner MD, FACS Previous Signature: 08/22/2015 2:39:11 PM Version By: Evlyn Kanner MD, FACS Previous Signature: 08/22/2015 11:30:06 AM Version By: Evlyn Kanner MD, FACS Entered By: Evlyn Kanner on 08/23/2015 16:14:59 [name] [name] ([id number]) -------------------------------------------------------------------------------- SuperBill Details Patient Name: [name] [name] Date of Service: 08/22/2015 Medical Record Number: [id number] Patient Account Number: [id number] Date of Birth/Sex: [birth year] (30 y.o. Male) Treating RN: Phillis Haggis Primary Care Physician: Bethann Punches Other Clinician: Referring Physician: Bethann Punches Treating Physician/Extender: Rudene Re in Treatment: 2 Diagnosis Coding ICD-10 Codes Code Description [phone] Personal history of traumatic brain injury [phone] Pressure ulcer of left heel, stage 3 Facility Procedures CPT4 Code: [id number] Description: [id number] - WOUND CARE VISIT-LEV 2 EST PT Modifier: Quantity: 1 Physician Procedures CPT4 Code: [id number] Description: 99213 - WC PHYS LEVEL 3 - EST PT ICD-10 Description Diagnosis Z87.820 Personal history of traumatic brain injury L89.623 Pressure ulcer of left heel, stage 3 Modifier: Quantity: 1 Electronic Signature(s) Signed: 08/22/2015 2:32:13 PM By: Evlyn Kanner MD, FACS Signed: 08/22/2015 3:03:16 PM By: Alejandro Mulling Previous Signature: 08/22/2015 11:31:18 AM Version By: Evlyn Kanner MD, FACS Entered By: Alejandro Mulling on 08/22/2015 14:17:04

## 2015-08-22 NOTE — Progress Notes (Signed)
TIEGAN, TERPSTRA ([id number]) Visit Report for 08/22/2015 Arrival Information Details Patient Name: [name] [name], [name] [name] Date of Service: 08/22/2015 10:45 AM Medical Record Number: [id number] Patient Account Number: [id number] Date of Birth/Sex: 1986/01/22 (30 y.o. Male) Treating RN: Phillis Haggis Primary Care Physician: Bethann Punches Other Clinician: Referring Physician: Bethann Punches Treating Physician/Extender: Rudene Re in Treatment: 2 Visit Information History Since Last Visit All ordered tests and consults were completed: No Patient Arrived: Wheel Chair Added or deleted any medications: No Arrival Time: 11:06 Any new allergies or adverse reactions: No Accompanied By: caregiver Had a fall or experienced change in No activities of daily living that may affect Transfer Assistance: None risk of falls: Patient Identification Verified: Yes Signs or symptoms of abuse/neglect since last No Secondary Verification Process Yes visito Completed: Hospitalized since last visit: No Patient Requires Transmission-Based No Pain Present Now: Yes Precautions: Patient Has Alerts: No Electronic Signature(s) Signed: 08/22/2015 3:03:16 PM By: Alejandro Mulling Entered By: Alejandro Mulling on 08/22/2015 11:06:31 [name] [name] ([id number]) -------------------------------------------------------------------------------- Clinic Level of Care Assessment Details Patient Name: [name] [name] Date of Service: 08/22/2015 10:45 AM Medical Record Number: [id number] Patient Account Number: [id number] Date of Birth/Sex: 1986-03-08 (30 y.o. Male) Treating RN: Phillis Haggis Primary Care Physician: Bethann Punches Other Clinician: Referring Physician: Bethann Punches Treating Physician/Extender: Rudene Re in Treatment: 2 Clinic Level of Care Assessment Items TOOL 4 Quantity Score []  - Use when only an EandM is performed on FOLLOW-UP visit 0 ASSESSMENTS - Nursing Assessment / Reassessment []  - Reassessment  of Co-morbidities (includes updates in patient status) 0 X - Reassessment of Adherence to Treatment Plan 1 5 ASSESSMENTS - Wound and Skin Assessment / Reassessment X - Simple Wound Assessment / Reassessment - one wound 1 5 []  - Complex Wound Assessment / Reassessment - multiple wounds 0 []  - Dermatologic / Skin Assessment (not related to wound area) 0 ASSESSMENTS - Focused Assessment []  - Circumferential Edema Measurements - multi extremities 0 []  - Nutritional Assessment / Counseling / Intervention 0 []  - Lower Extremity Assessment (monofilament, tuning fork, pulses) 0 []  - Peripheral Arterial Disease Assessment (using hand held doppler) 0 ASSESSMENTS - Ostomy and/or Continence Assessment and Care []  - Incontinence Assessment and Management 0 []  - Ostomy Care Assessment and Management (repouching, etc.) 0 PROCESS - Coordination of Care X - Simple Patient / Family Education for ongoing care 1 15 []  - Complex (extensive) Patient / Family Education for ongoing care 0 []  - Staff obtains Chiropractor, Records, Test Results / Process Orders 0 []  - Staff telephones HHA, Nursing Homes / Clarify orders / etc 0 []  - Routine Transfer to another Facility (non-emergent condition) 0 Fisk, Kramer ([id number]) []  - Routine Hospital Admission (non-emergent condition) 0 []  - New Admissions / Manufacturing engineer / Ordering NPWT, Apligraf, etc. 0 []  - Emergency Hospital Admission (emergent condition) 0 X - Simple Discharge Coordination 1 10 []  - Complex (extensive) Discharge Coordination 0 PROCESS - Special Needs []  - Pediatric / Minor Patient Management 0 []  - Isolation Patient Management 0 []  - Hearing / Language / Visual special needs 0 []  - Assessment of Community assistance (transportation, D/C planning, etc.) 0 []  - Additional assistance / Altered mentation 0 []  - Support Surface(s) Assessment (bed, cushion, seat, etc.) 0 INTERVENTIONS - Wound Cleansing / Measurement X - Simple Wound Cleansing  - one wound 1 5 []  - Complex Wound Cleansing - multiple wounds 0 X - Wound Imaging (photographs - any number of wounds) 1 5 []  - Wound Tracing (instead of photographs)  0 X - Simple Wound Measurement - one wound 1 5  - Complex Wound Measurement - multiple wounds 0 INTERVENTIONS - Wound Dressings X - Small Wound Dressing one or multiple wounds 1 10  - Medium Wound Dressing one or multiple wounds 0  - Large Wound Dressing one or multiple wounds 0 X - Application of Medications - topical 1 5  - Application of Medications - injection 0 INTERVENTIONS - Miscellaneous  - External ear exam 0 Robart, Fannie ([id number])  - Specimen Collection (cultures, biopsies, blood, body fluids, etc.) 0  - Specimen(s) / Culture(s) sent or taken to Lab for analysis 0  - Patient Transfer (multiple staff / Michiel Sites Lift / Similar devices) 0  - Simple Staple / Suture removal (25 or less) 0  - Complex Staple / Suture removal (26 or more) 0  - Hypo / Hyperglycemic Management (close monitor of Blood Glucose) 0  - Ankle / Brachial Index (ABI) - do not check if billed separately 0 X - Vital Signs 1 5 Has the patient been seen at the hospital within the last three years: Yes Total Score: 70 Level Of Care: New/Established - Level 2 Electronic Signature(s) Signed: 08/22/2015 3:03:16 PM By: Alejandro Mulling Entered By: Alejandro Mulling on 08/22/2015 14:16:52 [name] [name] ([id number]) -------------------------------------------------------------------------------- Encounter Discharge Information Details Patient Name: [name] [name] Date of Service: 08/22/2015 10:45 AM Medical Record Number: [id number] Patient Account Number: [id number] Date of Birth/Sex: 05/15/1986 (30 y.o. Male) Treating RN: Phillis Haggis Primary Care Physician: Bethann Punches Other Clinician: Referring Physician: Bethann Punches Treating Physician/Extender: Rudene Re in Treatment: 2 Encounter Discharge Information  Items Discharge Pain Level: 0 Discharge Condition: Stable Ambulatory Status: Wheelchair Discharge Destination: Home Transportation: Private Auto Accompanied By: caregiver Schedule Follow-up Appointment: Yes Medication Reconciliation completed and provided to Patient/Care Yes Sherlock Nancarrow: Provided on Clinical Summary of Care: 08/22/2015 Form Type Recipient Paper Patient ZI Electronic Signature(s) Signed: 08/22/2015 11:34:47 AM By: Gwenlyn Perking Entered By: Gwenlyn Perking on 08/22/2015 11:34:47 [name] [name] ([id number]) -------------------------------------------------------------------------------- Lower Extremity Assessment Details Patient Name: [name] [name] Date of Service: 08/22/2015 10:45 AM Medical Record Number: [id number] Patient Account Number: [id number] Date of Birth/Sex: Feb 12, 1986 (30 y.o. Male) Treating RN: Phillis Haggis Primary Care Physician: Bethann Punches Other Clinician: Referring Physician: Bethann Punches Treating Physician/Extender: Rudene Re in Treatment: 2 Vascular Assessment Pulses: Posterior Tibial Dorsalis Pedis Palpable: [Left:Yes] Extremity colors, hair growth, and conditions: Extremity Color: [Left:Normal] Temperature of Extremity: [Left:Warm] Capillary Refill: [Left:< 3 seconds] Electronic Signature(s) Signed: 08/22/2015 3:03:16 PM By: Alejandro Mulling Entered By: Alejandro Mulling on 08/22/2015 11:10:43 [name] [name] ([id number]) -------------------------------------------------------------------------------- Multi Wound Chart Details Patient Name: [name] [name] Date of Service: 08/22/2015 10:45 AM Medical Record Number: [id number] Patient Account Number: [id number] Date of Birth/Sex: 21-Feb-1986 (30 y.o. Male) Treating RN: Phillis Haggis Primary Care Physician: Bethann Punches Other Clinician: Referring Physician: Bethann Punches Treating Physician/Extender: Rudene Re in Treatment: 2 Vital Signs Height(in): 72 Pulse(bpm):  72 Weight(lbs): 200 Blood Pressure 130/63 (mmHg): Body Mass Index(BMI): 27 Temperature(F): 97.5 Respiratory Rate 18 (breaths/min): Photos: [1:No Photos] [N/A:N/A] Wound Location: [1:Left Calcaneus] [N/A:N/A] Wounding Event: [1:Gradually Appeared] [N/A:N/A] Primary Etiology: [1:Infection - not elsewhere N/A classified] Comorbid History: [1:Hypertension, Hepatitis C, N/A History of pressure wounds, Paraplegia] Date Acquired: [1:06/11/2015] [N/A:N/A] Weeks of Treatment: [1:2] [N/A:N/A] Wound Status: [1:Open] [N/A:N/A] Measurements L x W x D 1.5x0.8x0.1 [N/A:N/A] (cm) Area (cm) : [1:0.942] [N/A:N/A] Volume (cm) : [1:0.094] [N/A:N/A] % Reduction in Area: [1:29.40%] [N/A:N/A] % Reduction in Volume: 29.90% [N/A:N/A] Classification: [1:Full Thickness Without Exposed Support Structures] [  N/A:N/A] Exudate Amount: [1:Medium] [N/A:N/A] Exudate Type: [1:Serosanguineous] [N/A:N/A] Exudate Color: [1:red, brown] [N/A:N/A] Wound Margin: [1:Distinct, outline attached N/A] Granulation Amount: [1:Small (1-33%)] [N/A:N/A] Granulation Quality: [1:Pale] [N/A:N/A] Necrotic Amount: [1:Large (67-100%)] [N/A:N/A] Necrotic Tissue: [1:Eschar, Adherent Slough N/A] Exposed Structures: [1:Fascia: No Fat: No] [N/A:N/A] Tendon: No Muscle: No Joint: No Bone: No Limited to Skin Breakdown Epithelialization: None N/A N/A Periwound Skin Texture: Edema: Yes N/A N/A Excoriation: No Induration: No Callus: No Crepitus: No Fluctuance: No Friable: No Rash: No Scarring: No Periwound Skin Moist: Yes N/A N/A Moisture: Maceration: No Dry/Scaly: No Periwound Skin Color: Atrophie Blanche: No N/A N/A Cyanosis: No Ecchymosis: No Erythema: No Hemosiderin Staining: No Mottled: No Pallor: No Rubor: No Temperature: No Abnormality N/A N/A Tenderness on Yes N/A N/A Palpation: Wound Preparation: Ulcer Cleansing: N/A N/A Rinsed/Irrigated with Saline Topical Anesthetic Applied: Other:  lidocaine 4% Treatment Notes Electronic Signature(s) Signed: 08/22/2015 3:03:16 PM By: Alejandro Mulling Entered By: Alejandro Mulling on 08/22/2015 11:18:52 [name] [name] ([id number]) -------------------------------------------------------------------------------- Multi-Disciplinary Care Plan Details Patient Name: [name] [name] Date of Service: 08/22/2015 10:45 AM Medical Record Number: [id number] Patient Account Number: [id number] Date of Birth/Sex: 02/05/1986 (30 y.o. Male) Treating RN: Phillis Haggis Primary Care Physician: Bethann Punches Other Clinician: Referring Physician: Bethann Punches Treating Physician/Extender: Rudene Re in Treatment: 2 Active Inactive Electronic Signature(s) Signed: 08/22/2015 3:03:16 PM By: Alejandro Mulling Entered By: Alejandro Mulling on 08/22/2015 11:18:43 [name] [name] ([id number]) -------------------------------------------------------------------------------- Pain Assessment Details Patient Name: [name] [name] Date of Service: 08/22/2015 10:45 AM Medical Record Number: [id number] Patient Account Number: [id number] Date of Birth/Sex: 1985/09/12 (30 y.o. Male) Treating RN: Phillis Haggis Primary Care Physician: Bethann Punches Other Clinician: Referring Physician: Bethann Punches Treating Physician/Extender: Rudene Re in Treatment: 2 Active Problems Location of Pain Severity and Description of Pain Patient Has Paino Patient Unable to Respond Site Locations Pain Management and Medication Current Pain Management: Notes pt has head injury and he can only say his legs hurt Electronic Signature(s) Signed: 08/22/2015 3:03:16 PM By: Alejandro Mulling Entered By: Alejandro Mulling on 08/22/2015 11:07:04 [name] [name] ([id number]) -------------------------------------------------------------------------------- Patient/Caregiver Education Details Patient Name: [name] [name] Date of Service: 08/22/2015 10:45 AM Medical Record Number:  [id number] Patient Account Number: [id number] Date of Birth/Gender: 11-Jun-1986 (30 y.o. Male) Treating RN: Phillis Haggis Primary Care Physician: Bethann Punches Other Clinician: Referring Physician: Bethann Punches Treating Physician/Extender: Rudene Re in Treatment: 2 Education Assessment Education Provided To: Caregiver Education Topics Provided Wound/Skin Impairment: Handouts: Other: change dressing as ordered Electronic Signature(s) Signed: 08/22/2015 3:03:16 PM By: Alejandro Mulling Entered By: Alejandro Mulling on 08/22/2015 11:30:45 [name] [name] ([id number]) -------------------------------------------------------------------------------- Wound Assessment Details Patient Name: [name] [name] Date of Service: 08/22/2015 10:45 AM Medical Record Number: [id number] Patient Account Number: [id number] Date of Birth/Sex: 06-17-1986 (30 y.o. Male) Treating RN: Phillis Haggis Primary Care Physician: Bethann Punches Other Clinician: Referring Physician: Bethann Punches Treating Physician/Extender: Rudene Re in Treatment: 2 Wound Status Wound Number: 1 Primary Infection - not elsewhere classified Etiology: Wound Location: Left Calcaneus Wound Open Wounding Event: Gradually Appeared Status: Date Acquired: 06/11/2015 Comorbid Hypertension, Hepatitis C, History of Weeks Of Treatment: 2 History: pressure wounds, Paraplegia Clustered Wound: No Photos Photo Uploaded By: Alejandro Mulling on 08/22/2015 14:04:22 Wound Measurements Length: (cm) 1.5 Width: (cm) 0.8 Depth: (cm) 0.1 Area: (cm) 0.942 Volume: (cm) 0.094 % Reduction in Area: 29.4% % Reduction in Volume: 29.9% Epithelialization: None Tunneling: No Undermining: No Wound Description Full Thickness Without Exposed Foul Odor Afte Classification: Support Structures Wound Margin: Distinct, outline attached Exudate Medium Amount: Exudate Type:  Serosanguineous Exudate Color: red, brown r Cleansing: No Wound  Bed Granulation Amount: Small (1-33%) Exposed Structure Granulation Quality: Pale Fascia Exposed: No Necrotic Amount: Large (67-100%) Fat Layer Exposed: No Benefiel, Earna Coder ([id number]) Necrotic Quality: Eschar, Adherent Slough Tendon Exposed: No Muscle Exposed: No Joint Exposed: No Bone Exposed: No Limited to Skin Breakdown Periwound Skin Texture Texture Color No Abnormalities Noted: No No Abnormalities Noted: No Callus: No Atrophie Blanche: No Crepitus: No Cyanosis: No Excoriation: No Ecchymosis: No Fluctuance: No Erythema: No Friable: No Hemosiderin Staining: No Induration: No Mottled: No Localized Edema: Yes Pallor: No Rash: No Rubor: No Scarring: No Temperature / Pain Moisture Temperature: No Abnormality No Abnormalities Noted: No Tenderness on Palpation: Yes Dry / Scaly: No Maceration: No Moist: Yes Wound Preparation Ulcer Cleansing: Rinsed/Irrigated with Saline Topical Anesthetic Applied: Other: lidocaine 4%, Treatment Notes Wound #1 (Left Calcaneus) 1. Cleansed with: Clean wound with Normal Saline 2. Anesthetic Topical Lidocaine 4% cream to wound bed prior to debridement 4. Dressing Applied: Santyl Ointment 5. Secondary Dressing Applied Bordered Foam Dressing Notes sage boot Electronic Signature(s) Signed: 08/22/2015 3:03:16 PM By: Alejandro Mulling Entered By: Alejandro Mulling on 08/22/2015 11:18:34 [name] [name] ([id number]) -------------------------------------------------------------------------------- Vitals Details Patient Name: [name] [name] Date of Service: 08/22/2015 10:45 AM Medical Record Number: [id number] Patient Account Number: [id number] Date of Birth/Sex: 09/14/1985 (30 y.o. Male) Treating RN: Phillis Haggis Primary Care Physician: Bethann Punches Other Clinician: Referring Physician: Bethann Punches Treating Physician/Extender: Rudene Re in Treatment: 2 Vital Signs Time Taken: 11:07 Temperature (F): 97.5 Height (in):  72 Pulse (bpm): 72 Weight (lbs): 200 Respiratory Rate (breaths/min): 18 Body Mass Index (BMI): 27.1 Blood Pressure (mmHg): 130/63 Reference Range: 80 - 120 mg / dl Electronic Signature(s) Signed: 08/22/2015 3:03:16 PM By: Alejandro Mulling Entered By: Alejandro Mulling on 08/22/2015 11:09:26

## 2015-08-23 ENCOUNTER — Ambulatory Visit: Payer: Commercial Managed Care - HMO | Admitting: Occupational Therapy

## 2015-08-23 ENCOUNTER — Encounter: Payer: Self-pay | Admitting: Physical Therapy

## 2015-08-23 ENCOUNTER — Encounter: Payer: Self-pay | Admitting: Occupational Therapy

## 2015-08-23 ENCOUNTER — Ambulatory Visit: Payer: Commercial Managed Care - HMO | Admitting: Physical Therapy

## 2015-08-23 DIAGNOSIS — R278 Other lack of coordination: Secondary | ICD-10-CM

## 2015-08-23 DIAGNOSIS — Z9181 History of falling: Secondary | ICD-10-CM

## 2015-08-23 DIAGNOSIS — R471 Dysarthria and anarthria: Secondary | ICD-10-CM | POA: Diagnosis not present

## 2015-08-23 DIAGNOSIS — Z7409 Other reduced mobility: Secondary | ICD-10-CM

## 2015-08-23 DIAGNOSIS — M6281 Muscle weakness (generalized): Secondary | ICD-10-CM

## 2015-08-23 NOTE — Therapy (Signed)
[facility] MAIN [facility] SERVICES [address] [place], [place], [address] Phone: [phone]   Fax:  [phone]  Occupational Therapy Evaluation   Patient Details  Name: [name] [name] MRN: [id number] Date of Birth: 1985-08-11 Referring Provider: Lysbeth Penner  Encounter Date: 08/23/2015    Past Medical History  Diagnosis Date  . Hepatitis C   . Scoliosis   . TBI (traumatic brain injury) (HCC) 07/2011  . Subarachnoid hemorrhage (HCC)   . Pulmonary emboli (HCC)   . Hepatitis C   . Depression     Past Surgical History  Procedure Laterality Date  . Fracture surgery      left acetabulum  . Ileostomy    . Lung tube    . Ankle surgery Bilateral   . Tracheostomy    . Peg tube placement    . [place] gastrostomy  07/02/2014  . Ivc filter placement ([facility] hx)    . Brain surgery      There were no vitals filed for this visit.  Visit Diagnosis:  Incoordination of extremity      Subjective Assessment - 08/23/15 1528    Subjective  Patient reports his arm used to do more.   Patient is accompained by: Family member   Pertinent History TBI 2013    Pain Score 6   legs    This patient is a 30 year old male who came to [facility] with a TBI from 2013.  As per mom, patient had been able to self feed, use cell phone, brush teeth, wash face and use Deoderant and is not able to do these activities now. Patient was only intermittently cooperative as all areas of testing were not complete.  Left upper extremity is non functional and in a synergistic flexion pattern. When asked to move L upper extremity there was no movement.  R UE active movement is as follows; He demonstrate a combination of shoulder flexion/abduction of 85o, elbow extension limited to -10o, flexion is full, supination to 47o, wrist extension to 39o, wrist flexion to 11o, hand motion gross grasp open and close 70%.Patient is in a wheel chair in a semirecumbent position.                             OT Education - 08/23/15 1532    Education provided Yes   Education Details In purpose of placing "noodle" in hand.   Person(s) Educated Patient;Parent(s);Caregiver(s)   Methods Explanation   Comprehension Verbalized understanding             OT Long Term Goals - 08/23/15 1628    OT LONG TERM GOAL #1   Title Patient will increase active motion in R UE sufficient to self feed with or with out assistive devices.   Baseline Unable to feed self any more.   Time 12   Period Weeks   Status New   OT LONG TERM GOAL #2   Title Patient will show improved upper extremity function to be able to use cell phone.   Baseline unable to use cell phone   Time 12   Period Weeks   Status New   OT LONG TERM GOAL #3   Title Patient will improve upper extremity function to be able to be able to wash his face with or with out assistive devices.   Baseline Unable to wash face.   Time 12   Period Days   Status  New   OT LONG TERM GOAL #4   Title Will improve R UE range of motion to be able to bruch teeth with or with out assistive devices.   Baseline Unable to brush teeth.   Time 12   Period Weeks   Status New   OT LONG TERM GOAL #5   Title Will be able to positioin L UE to prevent further contractures and skin break down.   Baseline Patient's hand is very tight and arm is stuck in synergy.   Time 12   Period Weeks   Status New               Plan - 09/12/2015 1621    Clinical Impression Statement This patient is a 30 year old male who came to [facility] with a TBI from 2013. He and his mom report that his R UE had been able to do more ADL than it does now. He used to be able to self feed, use cell phone, brush teeth, wash face.and is unable to do any of this now. He would benefit from OT  to address these areas and improve UE function.   Pt will benefit from skilled therapeutic intervention in order to improve on the following deficits (Retired)  Decreased activity tolerance;Decreased coordination;Decreased endurance;Decreased range of motion;Decreased skin integrity;Decreased strength;Impaired UE functional use;Impaired tone   OT Frequency 1x / week   OT Duration 8 weeks   OT Treatment/Interventions Self-care/ADL training;Neuromuscular education;Therapeutic exercise;Energy conservation;Manual Therapy;Therapeutic activities;Therapeutic exercises;Splinting;Passive range of motion;Patient/family education   Consulted and Agree with Plan of Care Family member/caregiver   Family Member Consulted mother and aid.          G-Codes - September 12, 2015 1713    Functional Assessment Tool Used clinical judgement   Functional Limitation Self care   Self Care Current Status [phone]) At least 1 percent but less than 20 percent impaired, limited or restricted   Self Care Goal Status (B2841) At least 20 percent but less than 40 percent impaired, limited or restricted      Problem List Patient Active Problem List   Diagnosis Date Noted  . Colitis, Clostridium difficile 06/19/2015  . Hypoxia 05/25/2015  . Clostridium difficile infection 05/25/2015  . Mucus plugging of bronchi 05/25/2015  . Low grade fever 05/25/2015  . History of pulmonary embolism 05/25/2015  . OSA on CPAP 05/25/2015  . Pressure ulcer 05/25/2015  . Convulsions/seizures (HCC) 02/10/2015  . Aspiration pneumonia (HCC) 02/07/2015  . Encephalopathy, metabolic 02/07/2015  . Acute respiratory failure (HCC) 01/29/2015  . Skin irritation 11/09/2014  . TBI (traumatic brain injury) (HCC) 02/04/2012  . 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 Heterotopic ossification 02/04/2012   [name], MS/OTR/L  [name] 2015-09-12, 5:19 PM  [facility] MAIN [facility] SERVICES [address] [place], [place], [address] Phone: [phone]   Fax:  [phone]  Name: [name] [name] MRN: [id number] Date of Birth: 05-Dec-1985

## 2015-08-23 NOTE — Patient Instructions (Signed)
Instructed to continue with placing "noodle"

## 2015-08-24 ENCOUNTER — Ambulatory Visit: Payer: Commercial Managed Care - HMO | Admitting: Speech Pathology

## 2015-08-24 DIAGNOSIS — R471 Dysarthria and anarthria: Secondary | ICD-10-CM

## 2015-08-24 NOTE — Therapy (Addendum)
[facility] MAIN [facility] SERVICES [address] [place], [place], [address] Phone: [phone]   Fax:  [phone]  Physical Therapy Evaluation  Patient Details  Name: [name] [name] MRN: [id number] Date of Birth: April 13, 1986 Referring Provider: [name] MD  Encounter Date: 08/23/2015      PT End of Session - 08/24/15 6962    Visit Number 1   Number of Visits 25   Date for PT Re-Evaluation 11/16/15   PT Start Time 1600   PT Stop Time 1630   PT Time Calculation (min) 30 min   Activity Tolerance Treatment limited secondary to agitation   Behavior During Therapy Agitated;Anxious      Past Medical History  Diagnosis Date  . Hepatitis C   . Scoliosis   . TBI (traumatic brain injury) (HCC) 07/2011  . Subarachnoid hemorrhage (HCC)   . Pulmonary emboli (HCC)   . Hepatitis C   . Depression     Past Surgical History  Procedure Laterality Date  . Fracture surgery      left acetabulum  . Ileostomy    . Lung tube    . Ankle surgery Bilateral   . Tracheostomy    . Peg tube placement    . Janeway gastrostomy  07/02/2014  . Ivc filter placement ([facility] hx)    . Brain surgery      There were no vitals filed for this visit.  Visit Diagnosis:  Muscular weakness - Plan: PT plan of care cert/re-cert  Risk for falls - Plan: PT plan of care cert/re-cert  Impaired mobility - Plan: PT plan of care cert/re-cert      Subjective Assessment - 08/23/15 1604    Subjective 30 yo Male s/p TBI in 2013 with quadriplegia; Mom, Donnamarie Poag was present during evaluation an assisted with history intake; Patient presents in tilted and in manual wheelchair; Patient has a power chair but doesn't work well; Caregiver reports that they hope to get another power wheelchair to assist with mobility; Patient had inpatient rehab in 2013 and was very strong being able to do squat pivot transfer; After getting home he got sick and [facility]; Inpatient  rehab went well; he was able to initiate standing and try to take a few steps with assistance; Mom reports increased deconditioning, regression, infections over last year; Working with pulmonary MD with chest  vest and has been able to control respiratory infections; Caregiver reports that he has been able to initiate standing in stander for 45 min 2x a day; Has a wound on left heel which won't heal; going to wound center to work on healing; Hoping to be able to do more with his legs; Appealing the state for a leg bike; Also requesting to get an arm bike for increased mobility; Caregiver reports wanting to do all rehab that's possible; getting close to trying a stem cell research;  (she is thinking that stem cell research will be in April); Hoping to get weaned off baclofen pump for surgery; Patient is getting ROM and stretching 2x a day; standing in standing frame 2x a day; Sister/dad able to stand him with gait belt for transfer; 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 na   How long can you stand comfortably? able to stand for a few seconds with max A; stands in stander for 45 min at a time 2x a day;   How long can you walk comfortably? unable to ambulate at this time   Patient Stated Goals having leg pain, hoping to work on HEP and mobility; Improve mobility prior to stem cell surgery;    Currently in Pain? No/denies            [facility] PT Assessment - 08/24/15 0001    Assessment   Medical Diagnosis s/p TBI 2013; z87.820   Referring Provider [name] MD   Onset Date/Surgical Date 07/30/11   Hand Dominance Right   Next MD Visit  unknown   Prior Therapy had inpatient and home health PT; very little outpatient PT   Precautions   Precautions Fall   Required Braces or Orthoses --  splints for BUE; no braces on BLE   Restrictions   Other Position/Activity Restrictions has wound on left heel; WBAT on LLE   Balance Screen   Has the patient fallen in the past 6 months No   Has the patient had a decrease in activity level because of a fear of falling?  No   Is the patient reluctant to leave their home because of a fear of falling?  No   Home Environment   Additional Comments Ramp to enter house, single level home, handicap home accessible; has power and manual chair in home; has home aide daily    Prior Function   Level of Independence Needs assistance with ADLs   Comments likes sports   Cognition   Overall Cognitive Status History of cognitive impairments - at baseline  speech therapist says he is rancho score #4   Observation/Other Assessments   Skin Integrity has wound on left heel; going to wound care for treatment;    Sensation   Light Touch Appears Intact   Posture/Postural Control   Posture Comments patient in a tilt wheelchair; demonstrates increased BLER tone with extensor and hip adduction;    AROM   Overall AROM Comments Patient able to initiate RUE movement and LUE AROM; unable to initiate LE movement;    Strength   Overall Strength Comments PT attempted to assess LE strength however patient uncooperative today;    Palpation   Palpation comment denies any tenderness to palpation;    Transfers   Comments unable to assess transfers based on patient's refusal; Caregiver reports that they use a hoyer and that he is able to stand for short periods of time with max A   Ambulation/Gait   Gait Comments unable to ambulate at this time;    High Level Balance   High Level Balance Comments unable to assess balance based on patient's refusal;                            PT Education -  08/24/15 0838    Education provided Yes   Education Details plan of care, recommendations   Person(s) Educated Patient;Parent(s)   Methods Explanation   Comprehension Verbalized understanding             PT Long Term Goals - 08/24/15 0909    PT LONG TERM GOAL #1   Title Caregivers will be independent in performing HEP with patient including PROM and AAROM exercise  for increased joint flexibility by 11/16/15   Baseline Caregivers are doing some PROM but would benefit from education to improve all joint mobility;   Time 12   Period Weeks   Status New   PT LONG TERM GOAL #2   Title Patient will be able to sit with 1 UE supported only with back unsupported keeping erect posture with supervision for ADLs such as dressing and bathing by 11/16/15   Baseline unable to sit unsupported without assistance;   Time 12   Period Weeks   Status New   PT LONG TERM GOAL #3   Title Patient will be mod A for initiating sit<>stand transfer with LRAD to improve functional mobility in the home by 11/16/15   Baseline requires max A to initiate standing but unable to stand without 2 person assist;   Time 12   Period Weeks   Status New   PT LONG TERM GOAL #4   Title Patient will be able to stand with 1 UE supported with CGA for at least 15 sec or more to improve standing tolerance for ADLs by 11/16/15   Baseline Patient unable to stand without heavy assistance; He is standing in stander for 45 min 2x a day but unable to stand with caregivers;    Time 12   Period Weeks   Status New               Plan - September 03, 2015 0840    Clinical Impression Statement 30 yo Male s/p TBI in 2013; Patient is mostly wheelchair bound and has impaired cognitition. He is #4 on ranchos scale and sometimes will have inappropriate behavior and outburts. Patient has had extensive inpatient rehab. He has tried some outpatient PT but not much. Patient is scheduled to have stem cell surgery in April of this year and his caregiver  is interested in him getting outpatient rehab to improve functional mobility. He has experienced a decline in function over last year with deconditioning, increased weakness, decreased transfer ability due to recent infections and hospitalizations. Patient was agitated during evaluation and was not cooperative to attempt transfer or assess ROM/strength. He would benefit from skilled PT intervention to improve functional mobility and strength;    Pt will benefit from skilled therapeutic intervention in order to improve on the following deficits Abnormal gait;Decreased endurance;Decreased skin integrity;Hypomobility;Impaired tone;Decreased activity tolerance;Decreased knowledge of use of DME;Decreased strength;Increased muscle spasms;Decreased mobility;Decreased balance;Decreased cognition;Impaired perceived functional ability;Decreased safety awareness;Decreased coordination   Rehab Potential Fair   Clinical Impairments Affecting Rehab Potential positive: good caregiver support; negative: chronic condition; Patient's current clinical presentation is unstable as he has impaired congnition, behavioral changes and mobility deficits that change regularly due to TBI presentation;    PT Frequency 2x / week   PT Duration 12 weeks   PT Treatment/Interventions ADLs/Self Care Home Management;Cryotherapy;Electrical Stimulation;Moist Heat;Balance training;Therapeutic exercise;Therapeutic activities;Functional mobility training;Gait training;DME Instruction;Neuromuscular re-education;Patient/family education;Taping;Energy conservation;Passive range of motion   PT Next Visit Plan work on transfers and strength testing   PT Home Exercise Plan will work on next session   Recommended Other Services getting speech and occupational therapy;    Consulted and Agree with Plan of Care Patient;Family member/caregiver   Family Member Consulted mom          G-Codes - 09/03/2015 0915    Functional Assessment Tool Used Clinical  judgement   Functional Limitation Changing and maintaining body position   Changing and Maintaining Body Position Current Status [phone]) At least 80 percent but less  than 100 percent impaired, limited or restricted   Changing and Maintaining Body Position Goal Status ([id number]) At least 40 percent but less than 60 percent impaired, limited or restricted       Problem List Patient Active Problem List   Diagnosis Date Noted  . Colitis, Clostridium difficile 06/19/2015  . Hypoxia 05/25/2015  . Clostridium difficile infection 05/25/2015  . Mucus plugging of bronchi 05/25/2015  . Low grade fever 05/25/2015  . History of pulmonary embolism 05/25/2015  . OSA on CPAP 05/25/2015  . Pressure ulcer 05/25/2015  . Convulsions/seizures (HCC) 02/10/2015  . Aspiration pneumonia (HCC) 02/07/2015  . Encephalopathy, metabolic 02/07/2015  . Acute respiratory failure (HCC) 01/29/2015  . Skin irritation 11/09/2014  . TBI (traumatic brain injury) (HCC) 02/04/2012  . 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

## 2015-08-25 ENCOUNTER — Encounter: Payer: Self-pay | Admitting: Speech Pathology

## 2015-08-25 NOTE — Therapy (Signed)
[facility] MAIN [facility] SERVICES [address] [place], [place], [address] Phone: [phone]   Fax:  [phone]  Speech Language Pathology Treatment  Patient Details  Name: [name] [name] MRN: [id number] Date of Birth: 06-03-1986 Referring Provider: Dr. Hyacinth Meeker  Encounter Date: 08/24/2015      End of Session - 08/25/15 0903    Visit Number 6   Number of Visits 8   Date for SLP Re-Evaluation 09/13/15   SLP Start Time 1500   SLP Stop Time  1547   SLP Time Calculation (min) 47 min   Activity Tolerance Treatment limited secondary to agitation      Past Medical History  Diagnosis Date  . Hepatitis C   . Scoliosis   . TBI (traumatic brain injury) (HCC) 07/2011  . Subarachnoid hemorrhage (HCC)   . Pulmonary emboli (HCC)   . Hepatitis C   . Depression     Past Surgical History  Procedure Laterality Date  . Fracture surgery      left acetabulum  . Ileostomy    . Lung tube    . Ankle surgery Bilateral   . Tracheostomy    . Peg tube placement    . Janeway gastrostomy  07/02/2014  . Ivc filter placement ([facility] hx)    . Brain surgery      There were no vitals filed for this visit.  Visit Diagnosis: Dysarthria      Subjective Assessment - 08/25/15 0902    Subjective Poor cooperation   Currently in Pain? Yes   Pain Score --  Patient reports pain in back and ribs               ADULT SLP TREATMENT - 08/25/15 0001    General Information   Behavior/Cognition Agitated   HPI TBI 07/2011   Treatment Provided   Treatment provided Cognitive-Linquistic   Pain Assessment   Pain Assessment --  Patient reports pain in back and ribs   Cognitive-Linquistic Treatment   Treatment focused on Dysarthria   Skilled Treatment The patient was agitated with poor participation in  direct therapy.  Patient completed 5 reps of 16 of 16 tongue, lip, jaw, and voice exercises.  Caregiver education was provided. Review of exercises for the tongue,  lips, jaw, and voice with targeted responses identified (eg. promoting range vs. strength). Reviewed potential substitute movements/activities to elicit given targeted response.   Assessment / Recommendations / Plan   Plan Continue with current plan of care   Progression Toward Goals   Progression toward goals Progressing toward goals  Caregiver education          SLP Education - 08/25/15 [phone]    Education provided Yes   Education Details exercises for the tongue, lips, jaw, and voice   Person(s) Educated Patient;Caregiver(s)   Methods Explanation;Demonstration   Comprehension Verbalized understanding            SLP Long Term Goals - 07/12/15 1634    SLP LONG TERM GOAL #1   Title Pt will improve oral motor strength / coordination for speech and swallowing by completing oral motor exercises with assistance from trained caregivers.   Time 6   Period Weeks   Status New   SLP LONG TERM GOAL #2   Title Pt will improve breath support for speech and swallowing by completing breath support exercises with assistance from trained caregivers.   Time 6   Period Weeks   Status New  Plan - 08/25/15 0904    Clinical Impression Statement The patient was less cooperative today and completed 3-5 reps of 4 of 16 exercises given max encouragement from SLP and caregiver.   Speech Therapy Frequency 1x /week   Duration Other (comment)   Treatment/Interventions Oral motor exercises;Other (comment);Patient/family education  Voice exercises   Potential to Achieve Goals Fair   Potential Considerations Ability to learn/carryover information;Cooperation/participation level;Severity of impairments   SLP Home Exercise Plan Tongue, lips, and jaw exercises, voice, breath support exercises   Consulted and Agree with Plan of Care Patient;Family member/caregiver   Family Member Consulted Caregiver        Problem List Patient Active Problem List   Diagnosis Date Noted  . Colitis,  Clostridium difficile 06/19/2015  . Hypoxia 05/25/2015  . Clostridium difficile infection 05/25/2015  . Mucus plugging of bronchi 05/25/2015  . Low grade fever 05/25/2015  . History of pulmonary embolism 05/25/2015  . OSA on CPAP 05/25/2015  . Pressure ulcer 05/25/2015  . Convulsions/seizures (HCC) 02/10/2015  . Aspiration pneumonia (HCC) 02/07/2015  . Encephalopathy, metabolic 02/07/2015  . Acute respiratory failure (HCC) 01/29/2015  . Skin irritation 11/09/2014  . TBI (traumatic brain injury) (HCC) 02/04/2012  . 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 Heterotopic ossification 02/04/2012   Dollene Primrose, MS/CCC- SLP  Leandrew Koyanagi 08/25/2015, 9:06 AM  [facility] MAIN [facility] SERVICES [address] [place], [place], [address] Phone: [phone]   Fax:  [phone]   Name: [name] [name] MRN: [id number] Date of Birth: February 08, 1986

## 2015-08-28 ENCOUNTER — Encounter: Payer: 59 | Admitting: Occupational Therapy

## 2015-08-29 ENCOUNTER — Encounter: Payer: 59 | Admitting: Surgery

## 2015-08-29 DIAGNOSIS — L89622 Pressure ulcer of left heel, stage 2: Secondary | ICD-10-CM | POA: Diagnosis not present

## 2015-08-30 ENCOUNTER — Encounter: Payer: BLUE CROSS/BLUE SHIELD | Admitting: Occupational Therapy

## 2015-08-30 ENCOUNTER — Ambulatory Visit: Payer: BLUE CROSS/BLUE SHIELD | Admitting: Physical Therapy

## 2015-08-31 ENCOUNTER — Encounter: Payer: BLUE CROSS/BLUE SHIELD | Admitting: Speech Pathology

## 2015-08-31 ENCOUNTER — Ambulatory Visit: Payer: 59 | Admitting: Physical Therapy

## 2015-08-31 NOTE — Progress Notes (Signed)
CHANE, COWDEN ([id number]) Visit Report for 08/29/2015 Arrival Information Details Patient Name: [name] [name], [name] [name] Date of Service: 08/29/2015 1:30 PM Medical Record Number: [id number] Patient Account Number: [id number] Date of Birth/Sex: [birth year] (29 y.o. Male) Treating RN: Phillis Haggis Primary Care Physician: Bethann Punches Other Clinician: Referring Physician: Bethann Punches Treating Physician/Extender: Rudene Re in Treatment: 3 Visit Information History Since Last Visit All ordered tests and consults were completed: No Patient Arrived: Wheel Chair Added or deleted any medications: No Arrival Time: 13:45 Any new allergies or adverse reactions: No Accompanied By: caregiver Had a fall or experienced change in No activities of daily living that may affect Transfer Assistance: Other risk of falls: Patient Identification Verified: Yes Signs or symptoms of abuse/neglect since last No Secondary Verification Process Yes visito Completed: Hospitalized since last visit: No Patient Requires Transmission-Based No Pain Present Now: No Precautions: Patient Has Alerts: No Electronic Signature(s) Signed: 08/30/2015 5:03:09 PM By: Alejandro Mulling Entered By: Alejandro Mulling on 08/29/2015 13:46:06 [name] [name] ([id number]) -------------------------------------------------------------------------------- Clinic Level of Care Assessment Details Patient Name: [name] [name] Date of Service: 08/29/2015 1:30 PM Medical Record Number: [id number] Patient Account Number: [id number] Date of Birth/Sex: April 01, 1986 (29 y.o. Male) Treating RN: Phillis Haggis Primary Care Physician: Bethann Punches Other Clinician: Referring Physician: Bethann Punches Treating Physician/Extender: Rudene Re in Treatment: 3 Clinic Level of Care Assessment Items TOOL 4 Quantity Score  - Use when only an EandM is performed on FOLLOW-UP visit 0 ASSESSMENTS - Nursing Assessment / Reassessment  - Reassessment of  Co-morbidities (includes updates in patient status) 0 X - Reassessment of Adherence to Treatment Plan 1 5 ASSESSMENTS - Wound and Skin Assessment / Reassessment X - Simple Wound Assessment / Reassessment - one wound 1 5  - Complex Wound Assessment / Reassessment - multiple wounds 0  - Dermatologic / Skin Assessment (not related to wound area) 0 ASSESSMENTS - Focused Assessment  - Circumferential Edema Measurements - multi extremities 0  - Nutritional Assessment / Counseling / Intervention 0  - Lower Extremity Assessment (monofilament, tuning fork, pulses) 0  - Peripheral Arterial Disease Assessment (using hand held doppler) 0 ASSESSMENTS - Ostomy and/or Continence Assessment and Care  - Incontinence Assessment and Management 0  - Ostomy Care Assessment and Management (repouching, etc.) 0 PROCESS - Coordination of Care X - Simple Patient / Family Education for ongoing care 1 15  - Complex (extensive) Patient / Family Education for ongoing care 0  - Staff obtains Chiropractor, Records, Test Results / Process Orders 0  - Staff telephones HHA, Nursing Homes / Clarify orders / etc 0  - Routine Transfer to another Facility (non-emergent condition) 0 [name], [name] ([id number])  - Routine Hospital Admission (non-emergent condition) 0  - New Admissions / Manufacturing engineer / Ordering NPWT, Apligraf, etc. 0  - Emergency Hospital Admission (emergent condition) 0 X - Simple Discharge Coordination 1 10  - Complex (extensive) Discharge Coordination 0 PROCESS - Special Needs  - Pediatric / Minor Patient Management 0  - Isolation Patient Management 0  - Hearing / Language / Visual special needs 0  - Assessment of Community assistance (transportation, D/C planning, etc.) 0  - Additional assistance / Altered mentation 0  - Support Surface(s) Assessment (bed, cushion, seat, etc.) 0 INTERVENTIONS - Wound Cleansing / Measurement X - Simple Wound Cleansing -  one wound 1 5  - Complex Wound Cleansing - multiple wounds 0 X - Wound Imaging (photographs - any number of wounds) 1 5  - Wound Tracing (instead of photographs)  0 X - Simple Wound Measurement - one wound 1 5  - Complex Wound Measurement - multiple wounds 0 INTERVENTIONS - Wound Dressings X - Small Wound Dressing one or multiple wounds 1 10  - Medium Wound Dressing one or multiple wounds 0  - Large Wound Dressing one or multiple wounds 0 X - Application of Medications - topical 1 5  - Application of Medications - injection 0 INTERVENTIONS - Miscellaneous  - External ear exam 0 [name], [name] ([id number])  - Specimen Collection (cultures, biopsies, blood, body fluids, etc.) 0  - Specimen(s) / Culture(s) sent or taken to Lab for analysis 0  - Patient Transfer (multiple staff / Michiel Sites Lift / Similar devices) 0  - Simple Staple / Suture removal (25 or less) 0  - Complex Staple / Suture removal (26 or more) 0  - Hypo / Hyperglycemic Management (close monitor of Blood Glucose) 0  - Ankle / Brachial Index (ABI) - do not check if billed separately 0 X - Vital Signs 1 5 Has the patient been seen at the hospital within the last three years: Yes Total Score: 70 Level Of Care: New/Established - Level 2 Electronic Signature(s) Signed: 08/30/2015 5:03:09 PM By: Alejandro Mulling Entered By: Alejandro Mulling on 08/29/2015 15:12:45 [name] [name] ([id number]) -------------------------------------------------------------------------------- Encounter Discharge Information Details Patient Name: [name] [name] Date of Service: 08/29/2015 1:30 PM Medical Record Number: [id number] Patient Account Number: [id number] Date of Birth/Sex: 1985/08/08 (29 y.o. Male) Treating RN: Phillis Haggis Primary Care Physician: Bethann Punches Other Clinician: Referring Physician: Bethann Punches Treating Physician/Extender: Rudene Re in Treatment: 3 Encounter Discharge Information  Items Discharge Pain Level: 0 Discharge Condition: Stable Ambulatory Status: Wheelchair Discharge Destination: Home Transportation: Private Auto Accompanied By: caregiver Schedule Follow-up Appointment: Yes Medication Reconciliation completed and provided to Patient/Care Yes Aaralynn Shepheard: Provided on Clinical Summary of Care: 08/29/2015 Form Type Recipient Paper Patient ZI Electronic Signature(s) Signed: 08/29/2015 2:20:41 PM By: Gwenlyn Perking Entered By: Gwenlyn Perking on 08/29/2015 14:20:41 [name] [name] ([id number]) -------------------------------------------------------------------------------- Lower Extremity Assessment Details Patient Name: [name] [name] Date of Service: 08/29/2015 1:30 PM Medical Record Number: [id number] Patient Account Number: [id number] Date of Birth/Sex: 03/05/1986 (29 y.o. Male) Treating RN: Phillis Haggis Primary Care Physician: Bethann Punches Other Clinician: Referring Physician: Bethann Punches Treating Physician/Extender: Rudene Re in Treatment: 3 Vascular Assessment Pulses: Posterior Tibial Dorsalis Pedis Palpable: [Left:Yes] Extremity colors, hair growth, and conditions: Temperature of Extremity: [Left:Warm] Capillary Refill: [Left:< 3 seconds] Toe Nail Assessment Left: Right: Thick: No Discolored: No Deformed: No Improper Length and Hygiene: No Electronic Signature(s) Signed: 08/30/2015 5:03:09 PM By: Alejandro Mulling Entered By: Alejandro Mulling on 08/29/2015 15:11:44 [name] [name] ([id number]) -------------------------------------------------------------------------------- Multi Wound Chart Details Patient Name: [name] [name] Date of Service: 08/29/2015 1:30 PM Medical Record Number: [id number] Patient Account Number: [id number] Date of Birth/Sex: 1985/12/23 (29 y.o. Male) Treating RN: Phillis Haggis Primary Care Physician: Bethann Punches Other Clinician: Referring Physician: Bethann Punches Treating Physician/Extender: Rudene Re in Treatment: 3 Vital Signs Height(in): 72 Pulse(bpm): 76 Weight(lbs): 200 Blood Pressure 138/84 (mmHg): Body Mass Index(BMI): 27 Temperature(F): 98.3 Respiratory Rate 18 (breaths/min): Photos: [1:No Photos] [N/A:N/A] Wound Location: [1:Left Calcaneus] [N/A:N/A] Wounding Event: [1:Gradually Appeared] [N/A:N/A] Primary Etiology: [1:Infection - not elsewhere N/A classified] Comorbid History: [1:Hypertension, Hepatitis C, N/A History of pressure wounds, Paraplegia] Date Acquired: [1:06/11/2015] [N/A:N/A] Weeks of Treatment: [1:3] [N/A:N/A] Wound Status: [1:Open] [N/A:N/A] Measurements L x W x D 1.5x1x0.2 [N/A:N/A] (cm) Area (cm) : [1:1.178] [N/A:N/A] Volume (cm) : [1:0.236] [N/A:N/A] % Reduction in Area: [1:11.80%] [N/A:N/A] %  Reduction in Volume: -76.10% [N/A:N/A] Classification: [1:Full Thickness Without Exposed Support Structures] [N/A:N/A] Exudate Amount: [1:Medium] [N/A:N/A] Exudate Type: [1:Serosanguineous] [N/A:N/A] Exudate Color: [1:red, brown] [N/A:N/A] Wound Margin: [1:Distinct, outline attached N/A] Granulation Amount: [1:Medium (34-66%)] [N/A:N/A] Granulation Quality: [1:Pink, Pale] [N/A:N/A] Necrotic Amount: [1:Medium (34-66%)] [N/A:N/A] Exposed Structures: [1:Fascia: No Fat: No Tendon: No] [N/A:N/A] Muscle: No Joint: No Bone: No Limited to Skin Breakdown Epithelialization: None N/A N/A Periwound Skin Texture: Edema: Yes N/A N/A Excoriation: No Induration: No Callus: No Crepitus: No Fluctuance: No Friable: No Rash: No Scarring: No Periwound Skin Moist: Yes N/A N/A Moisture: Maceration: No Dry/Scaly: No Periwound Skin Color: Atrophie Blanche: No N/A N/A Cyanosis: No Ecchymosis: No Erythema: No Hemosiderin Staining: No Mottled: No Pallor: No Rubor: No Temperature: No Abnormality N/A N/A Tenderness on Yes N/A N/A Palpation: Wound Preparation: Ulcer Cleansing: N/A N/A Rinsed/Irrigated with Saline Topical  Anesthetic Applied: Other: lidocaine 4% Treatment Notes Electronic Signature(s) Signed: 08/30/2015 5:03:09 PM By: Alejandro Mulling Entered By: Alejandro Mulling on 08/29/2015 13:56:34 [name] [name] ([id number]) -------------------------------------------------------------------------------- Multi-Disciplinary Care Plan Details Patient Name: [name] [name] Date of Service: 08/29/2015 1:30 PM Medical Record Number: [id number] Patient Account Number: [id number] Date of Birth/Sex: March 17, 1986 (29 y.o. Male) Treating RN: Phillis Haggis Primary Care Physician: Bethann Punches Other Clinician: Referring Physician: Bethann Punches Treating Physician/Extender: Rudene Re in Treatment: 3 Active Inactive Electronic Signature(s) Signed: 08/30/2015 5:03:09 PM By: Alejandro Mulling Entered By: Alejandro Mulling on 08/29/2015 13:56:26 [name] [name] ([id number]) -------------------------------------------------------------------------------- Pain Assessment Details Patient Name: [name] [name] Date of Service: 08/29/2015 1:30 PM Medical Record Number: [id number] Patient Account Number: [id number] Date of Birth/Sex: 04/27/1986 (29 y.o. Male) Treating RN: Phillis Haggis Primary Care Physician: Bethann Punches Other Clinician: Referring Physician: Bethann Punches Treating Physician/Extender: Rudene Re in Treatment: 3 Active Problems Location of Pain Severity and Description of Pain Patient Has Paino No Site Locations Pain Management and Medication Current Pain Management: Electronic Signature(s) Signed: 08/30/2015 5:03:09 PM By: Alejandro Mulling Entered By: Alejandro Mulling on 08/29/2015 13:46:13 [name] [name] ([id number]) -------------------------------------------------------------------------------- Patient/Caregiver Education Details Patient Name: [name] [name] Date of Service: 08/29/2015 1:30 PM Medical Record Number: [id number] Patient Account Number: [id number] Date of Birth/Gender: 26-Dec-1985  (29 y.o. Male) Treating RN: Phillis Haggis Primary Care Physician: Bethann Punches Other Clinician: Referring Physician: Bethann Punches Treating Physician/Extender: Rudene Re in Treatment: 3 Education Assessment Education Provided To: Caregiver Education Topics Provided Wound/Skin Impairment: Handouts: Other: change dressing as ordered Methods: Demonstration, Explain/Verbal Responses: State content correctly Electronic Signature(s) Signed: 08/30/2015 5:03:09 PM By: Alejandro Mulling Entered By: Alejandro Mulling on 08/29/2015 14:04:53 [name] [name] ([id number]) -------------------------------------------------------------------------------- Wound Assessment Details Patient Name: [name] [name] Date of Service: 08/29/2015 1:30 PM Medical Record Number: [id number] Patient Account Number: [id number] Date of Birth/Sex: 1985/12/23 (29 y.o. Male) Treating RN: Phillis Haggis Primary Care Physician: Bethann Punches Other Clinician: Referring Physician: Bethann Punches Treating Physician/Extender: Rudene Re in Treatment: 3 Wound Status Wound Number: 1 Primary Infection - not elsewhere classified Etiology: Wound Location: Left Calcaneus Wound Open Wounding Event: Gradually Appeared Status: Date Acquired: 06/11/2015 Comorbid Hypertension, Hepatitis C, History of Weeks Of Treatment: 3 History: pressure wounds, Paraplegia Clustered Wound: No Photos Photo Uploaded By: Alejandro Mulling on 08/29/2015 15:45:16 Wound Measurements Length: (cm) 1.5 Width: (cm) 1 Depth: (cm) 0.2 Area: (cm) 1.178 Volume: (cm) 0.236 % Reduction in Area: 11.8% % Reduction in Volume: -76.1% Epithelialization: None Tunneling: No Undermining: No Wound Description Full Thickness Without Exposed Foul Odor Afte Classification: Support Structures Wound Margin: Distinct, outline attached Exudate Medium Amount: Exudate Type: Serosanguineous Exudate  Color: red, brown r Cleansing: No Wound  Bed Granulation Amount: Medium (34-66%) Exposed Structure Granulation Quality: Pink, Pale Fascia Exposed: No Necrotic Amount: Medium (34-66%) Fat Layer Exposed: No [name], [name] Coder ([id number]) Necrotic Quality: Adherent Slough Tendon Exposed: No Muscle Exposed: No Joint Exposed: No Bone Exposed: No Limited to Skin Breakdown Periwound Skin Texture Texture Color No Abnormalities Noted: No No Abnormalities Noted: No Callus: No Atrophie Blanche: No Crepitus: No Cyanosis: No Excoriation: No Ecchymosis: No Fluctuance: No Erythema: No Friable: No Hemosiderin Staining: No Induration: No Mottled: No Localized Edema: Yes Pallor: No Rash: No Rubor: No Scarring: No Temperature / Pain Moisture Temperature: No Abnormality No Abnormalities Noted: No Tenderness on Palpation: Yes Dry / Scaly: No Maceration: No Moist: Yes Wound Preparation Ulcer Cleansing: Rinsed/Irrigated with Saline Topical Anesthetic Applied: Other: lidocaine 4%, Treatment Notes Wound #1 (Left Calcaneus) 1. Cleansed with: Clean wound with Normal Saline 2. Anesthetic Topical Lidocaine 4% cream to wound bed prior to debridement 3. Peri-wound Care: Skin Prep 4. Dressing Applied: Santyl Ointment 5. Secondary Dressing Applied Bordered Foam Dressing Dry Gauze Notes sage boot Electronic Signature(s) Signed: 08/30/2015 5:03:09 PM By: Angie Fava, [name] Coder ([id number]) Entered By: Alejandro Mulling on 08/29/2015 13:54:11 [name] [name] ([id number]) -------------------------------------------------------------------------------- Vitals Details Patient Name: [name] [name] Date of Service: 08/29/2015 1:30 PM Medical Record Number: [id number] Patient Account Number: [id number] Date of Birth/Sex: [birth year] (29 y.o. Male) Treating RN: Phillis Haggis Primary Care Physician: Bethann Punches Other Clinician: Referring Physician: Bethann Punches Treating Physician/Extender: Rudene Re in Treatment: 3 Vital  Signs Time Taken: 13:46 Temperature (F): 98.3 Height (in): 72 Pulse (bpm): 76 Weight (lbs): 200 Respiratory Rate (breaths/min): 18 Body Mass Index (BMI): 27.1 Blood Pressure (mmHg): 138/84 Reference Range: 80 - 120 mg / dl Electronic Signature(s) Signed: 08/30/2015 5:03:09 PM By: Alejandro Mulling Entered By: Alejandro Mulling on 08/29/2015 13:49:09

## 2015-09-01 ENCOUNTER — Ambulatory Visit: Payer: 59 | Admitting: Physical Therapy

## 2015-09-01 ENCOUNTER — Ambulatory Visit: Payer: Commercial Managed Care - HMO | Admitting: Speech Pathology

## 2015-09-01 NOTE — Progress Notes (Signed)
GRADYN, SHEIN ([id number]) Visit Report for 08/29/2015 Chief Complaint Document Details Patient Name: [name] [name], [name] [name] Date of Service: 08/29/2015 1:30 PM Medical Record Number: [id number] Patient Account Number: [id number] Date of Birth/Sex: [birth year] (29 y.o. Male) Treating RN: Phillis Haggis Primary Care Physician: Bethann Punches Other Clinician: Referring Physician: Bethann Punches Treating Physician/Extender: Rudene Re in Treatment: 3 Information Obtained from: Caregiver Chief Complaint Patient is at the clinic for treatment of an open pressure ulcer to his left heel which she's had for about 4-5 months Electronic Signature(s) Signed: 08/29/2015 2:21:05 PM By: Evlyn Kanner MD, FACS Entered By: Evlyn Kanner on 08/29/2015 14:21:05 [name] [name] ([id number]) -------------------------------------------------------------------------------- HPI Details Patient Name: [name] [name] Date of Service: 08/29/2015 1:30 PM Medical Record Number: [id number] Patient Account Number: [id number] Date of Birth/Sex: 02/23/1986 (29 y.o. Male) Treating RN: Phillis Haggis Primary Care Physician: Bethann Punches Other Clinician: Referring Physician: Bethann Punches Treating Physician/Extender: Rudene Re in Treatment: 3 History of Present Illness Location: left heel Quality: Patient reports No Pain. Severity: Patient states wound are getting worse. Duration: Patient has had the wound for > 3 months prior to seeking treatment at the wound center Context: The wound appeared gradually over time Modifying Factors: Other treatment(s) tried include:local care with some silver and zinc paste HPI Description: 30 year old male who has a history of traumatic brain injury and also has a ulcer on his left heel which was noted by the family about 4-5 months ago. The TBI was in January 2013 and prior to this visit the patient has had hyperbaric oxygen therapy approximately 2 years after his TBI. He is had  several gastrostomy tube placed by the gastroenterologist but then finally had a surgical procedure to bring the gastric mucosa to the skin surface. This was done approximately 9 months ago. Past medical history of hepatitis C, traumatic brain injury, pulmonary emboli, ileostomy, tracheostomy, PEG tube placement. Was a former smoker and quit in January 2013 Electronic Signature(s) Signed: 08/29/2015 2:21:14 PM By: Evlyn Kanner MD, FACS Entered By: Evlyn Kanner on 08/29/2015 14:21:14 [name] [name] ([id number]) -------------------------------------------------------------------------------- Physical Exam Details Patient Name: [name] [name] Date of Service: 08/29/2015 1:30 PM Medical Record Number: [id number] Patient Account Number: [id number] Date of Birth/Sex: [birth year] (29 y.o. Male) Treating RN: Phillis Haggis Primary Care Physician: Bethann Punches Other Clinician: Referring Physician: Bethann Punches Treating Physician/Extender: Rudene Re in Treatment: 3 Constitutional . Pulse regular. Respirations normal and unlabored. Afebrile. . Eyes Nonicteric. Reactive to light. Ears, Nose, Mouth, and Throat Lips, teeth, and gums WNL.Marland Kitchen Moist mucosa without lesions. Neck supple and nontender. No palpable supraclavicular or cervical adenopathy. Normal sized without goiter. Respiratory WNL. No retractions.. Cardiovascular Pedal Pulses WNL. No clubbing, cyanosis or edema. Lymphatic No adneopathy. No adenopathy. No adenopathy. Musculoskeletal Adexa without tenderness or enlargement.. Digits and nails w/o clubbing, cyanosis, infection, petechiae, ischemia, or inflammatory conditions.. Integumentary (Hair, Skin) No suspicious lesions. No crepitus or fluctuance. No peri-wound warmth or erythema. No masses.Marland Kitchen Psychiatric Judgement and insight Intact.. No evidence of depression, anxiety, or agitation.. Notes the wound on the left heel is looking much cleaner and I washed it out with saline  and gauze and remove some of the debris. It is too tender to debride with a curette and we will continue with the Santyl ointment locally Electronic Signature(s) Signed: 08/29/2015 2:21:58 PM By: Evlyn Kanner MD, FACS Entered By: Evlyn Kanner on 08/29/2015 14:21:57 [name] [name] ([id number]) -------------------------------------------------------------------------------- Physician Orders Details Patient Name: [name] [name] Date of Service: 08/29/2015 1:30 PM Medical Record Number: [id number] Patient Account  Number: [id number] Date of Birth/Sex: [birth year] (29 y.o. Male) Treating RN: Phillis Haggis Primary Care Physician: Bethann Punches Other Clinician: Referring Physician: Bethann Punches Treating Physician/Extender: Rudene Re in Treatment: 3 Verbal / Phone Orders: Yes Clinician: Pinkerton, Debi Read Back and Verified: Yes Diagnosis Coding Wound Cleansing Wound #1 Left Calcaneus o Clean wound with Normal Saline. Anesthetic Wound #1 Left Calcaneus o Topical Lidocaine 4% cream applied to wound bed prior to debridement Primary Wound Dressing Wound #1 Left Calcaneus o Santyl Ointment Secondary Dressing Wound #1 Left Calcaneus o Dry Gauze o Boardered Foam Dressing Dressing Change Frequency Wound #1 Left Calcaneus o Change dressing every day. Follow-up Appointments Wound #1 Left Calcaneus o Return Appointment in 1 week. Home Health Wound #1 Left Calcaneus o Continue Home Health Visits o Home Health Nurse may visit PRN to address patientos wound care needs. o FACE TO FACE ENCOUNTER: MEDICARE and MEDICAID PATIENTS: I certify that this patient is under my care and that I had a face-to-face encounter that meets the physician face-to-face encounter requirements with this patient on this date. The encounter with the patient was in whole or in part for the following MEDICAL CONDITION: (primary reason for Home Healthcare) MEDICAL NECESSITY: I certify, that  based on my findings, NURSING services are a medically necessary home health service. HOME BOUND STATUS: I certify that my clinical findings [name] [name], [name] [name] ([id number]) support that this patient is homebound (i.e., Due to illness or injury, pt requires aid of supportive devices such as crutches, cane, wheelchairs, walkers, the use of special transportation or the assistance of another person to leave their place of residence. There is a normal inability to leave the home and doing so requires considerable and taxing effort. Other absences are for medical reasons / religious services and are infrequent or of short duration when for other reasons). o If current dressing causes regression in wound condition, may D/C ordered dressing product/s and apply Normal Saline Moist Dressing daily until next Wound Healing Center / Other MD appointment. Notify Wound Healing Center of regression in wound condition at [phone]. o Please direct any NON-WOUND related issues/requests for orders to patient's Primary Care Physician Electronic Signature(s) Signed: 08/29/2015 3:33:01 PM By: Evlyn Kanner MD, FACS Signed: 08/30/2015 5:03:09 PM By: Alejandro Mulling Entered By: Alejandro Mulling on 08/29/2015 14:05:49 [name] [name] ([id number]) -------------------------------------------------------------------------------- Problem List Details Patient Name: [name] [name] Date of Service: 08/29/2015 1:30 PM Medical Record Number: [id number] Patient Account Number: [id number] Date of Birth/Sex: Dec 20, 1985 (29 y.o. Male) Treating RN: Phillis Haggis Primary Care Physician: Bethann Punches Other Clinician: Referring Physician: Bethann Punches Treating Physician/Extender: Rudene Re in Treatment: 3 Active Problems ICD-10 Encounter Code Description Active Date Diagnosis [phone] Pressure ulcer of left heel, stage 2 08/08/2015 Yes Z87.820 Personal history of traumatic brain injury 08/08/2015 Yes Inactive  Problems Resolved Problems Electronic Signature(s) Signed: 08/29/2015 3:33:01 PM By: Evlyn Kanner MD, FACS Signed: 08/30/2015 5:03:09 PM By: Alejandro Mulling Previous Signature: 08/29/2015 2:20:56 PM Version By: Evlyn Kanner MD, FACS Entered By: Alejandro Mulling on 08/29/2015 15:13:01 [name] [name] ([id number]) -------------------------------------------------------------------------------- Progress Note Details Patient Name: [name] [name] Date of Service: 08/29/2015 1:30 PM Medical Record Number: [id number] Patient Account Number: [id number] Date of Birth/Sex: Jan 22, 1986 (29 y.o. Male) Treating RN: Phillis Haggis Primary Care Physician: Bethann Punches Other Clinician: Referring Physician: Bethann Punches Treating Physician/Extender: Rudene Re in Treatment: 3 Subjective Chief Complaint Information obtained from Caregiver Patient is at the clinic for treatment of an open pressure ulcer to his left heel which she's had  for about 4-5 months History of Present Illness (HPI) The following HPI elements were documented for the patient's wound: Location: left heel Quality: Patient reports No Pain. Severity: Patient states wound are getting worse. Duration: Patient has had the wound for > 3 months prior to seeking treatment at the wound center Context: The wound appeared gradually over time Modifying Factors: Other treatment(s) tried include:local care with some silver and zinc paste 30 year old male who has a history of traumatic brain injury and also has a ulcer on his left heel which was noted by the family about 4-5 months ago. The TBI was in January 2013 and prior to this visit the patient has had hyperbaric oxygen therapy approximately 2 years after his TBI. He is had several gastrostomy tube placed by the gastroenterologist but then finally had a surgical procedure to bring the gastric mucosa to the skin surface. This was done approximately 9 months ago. Past medical history of  hepatitis C, traumatic brain injury, pulmonary emboli, ileostomy, tracheostomy, PEG tube placement. Was a former smoker and quit in January 2013 Objective Constitutional Pulse regular. Respirations normal and unlabored. Afebrile. Vitals Time Taken: 1:46 PM, Height: 72 in, Weight: 200 lbs, BMI: 27.1, Temperature: 98.3 F, Pulse: 76 bpm, Respiratory Rate: 18 breaths/min, Blood Pressure: 138/84 mmHg. Eyes [name] [name], [name] [name] ([id number]) Nonicteric. Reactive to light. Ears, Nose, Mouth, and Throat Lips, teeth, and gums WNL.Marland Kitchen Moist mucosa without lesions. Neck supple and nontender. No palpable supraclavicular or cervical adenopathy. Normal sized without goiter. Respiratory WNL. No retractions.. Cardiovascular Pedal Pulses WNL. No clubbing, cyanosis or edema. Lymphatic No adneopathy. No adenopathy. No adenopathy. Musculoskeletal Adexa without tenderness or enlargement.. Digits and nails w/o clubbing, cyanosis, infection, petechiae, ischemia, or inflammatory conditions.Marland Kitchen Psychiatric Judgement and insight Intact.. No evidence of depression, anxiety, or agitation.. General Notes: the wound on the left heel is looking much cleaner and I washed it out with saline and gauze and remove some of the debris. It is too tender to debride with a curette and we will continue with the Santyl ointment locally Integumentary (Hair, Skin) No suspicious lesions. No crepitus or fluctuance. No peri-wound warmth or erythema. No masses.. Wound #1 status is Open. Original cause of wound was Gradually Appeared. The wound is located on the Left Calcaneus. The wound measures 1.5cm length x 1cm width x 0.2cm depth; 1.178cm^2 area and 0.236cm^3 volume. The wound is limited to skin breakdown. There is no tunneling or undermining noted. There is a medium amount of serosanguineous drainage noted. The wound margin is distinct with the outline attached to the wound base. There is medium (34-66%) pink, pale granulation within  the wound bed. There is a medium (34-66%) amount of necrotic tissue within the wound bed including Adherent Slough. The periwound skin appearance exhibited: Localized Edema, Moist. The periwound skin appearance did not exhibit: Callus, Crepitus, Excoriation, Fluctuance, Friable, Induration, Rash, Scarring, Dry/Scaly, Maceration, Atrophie Blanche, Cyanosis, Ecchymosis, Hemosiderin Staining, Mottled, Pallor, Rubor, Erythema. Periwound temperature was noted as No Abnormality. The periwound has tenderness on palpation. Assessment [name] [name], [name] [name] ([id number]) Active Problems ICD-10 [phone] - Pressure ulcer of left heel, stage 2 Z87.820 - Personal history of traumatic brain injury Plan Wound Cleansing: Wound #1 Left Calcaneus: Clean wound with Normal Saline. Anesthetic: Wound #1 Left Calcaneus: Topical Lidocaine 4% cream applied to wound bed prior to debridement Primary Wound Dressing: Wound #1 Left Calcaneus: Santyl Ointment Secondary Dressing: Wound #1 Left Calcaneus: Dry Gauze Boardered Foam Dressing Dressing Change Frequency: Wound #1 Left Calcaneus: Change dressing every day. Follow-up  Appointments: Wound #1 Left Calcaneus: Return Appointment in 1 week. Home Health: Wound #1 Left Calcaneus: Continue Home Health Visits Home Health Nurse may visit PRN to address patient s wound care needs. FACE TO FACE ENCOUNTER: MEDICARE and MEDICAID PATIENTS: I certify that this patient is under my care and that I had a face-to-face encounter that meets the physician face-to-face encounter requirements with this patient on this date. The encounter with the patient was in whole or in part for the following MEDICAL CONDITION: (primary reason for Home Healthcare) MEDICAL NECESSITY: I certify, that based on my findings, NURSING services are a medically necessary home health service. HOME BOUND STATUS: I certify that my clinical findings support that this patient is homebound (i.e., Due to illness  or injury, pt requires aid of supportive devices such as crutches, cane, wheelchairs, walkers, the use of special transportation or the assistance of another person to leave their place of residence. There is a normal inability to leave the home and doing so requires considerable and taxing effort. Other absences are for medical reasons / religious services and are infrequent or of short duration when for other reasons). If current dressing causes regression in wound condition, may D/C ordered dressing product/s and apply Normal Saline Moist Dressing daily until next Wound Healing Center / Other MD appointment. Notify Wound Healing Center of regression in wound condition at [phone]. Please direct any NON-WOUND related issues/requests for orders to patient's Primary Care Physician [name] [name], [name] [name] ([id number]) I have recommended wound care with Santyl ointment and a heel protector. They also using [employer], appropriate Air mattress and offloading appropriately. Have discussed increase protein intake, multivitamins, vitamin C and zinc supplements. Electronic Signature(s) Signed: 08/29/2015 3:38:56 PM By: Evlyn Kanner MD, FACS Previous Signature: 08/29/2015 3:38:49 PM Version By: Evlyn Kanner MD, FACS Previous Signature: 08/29/2015 2:22:52 PM Version By: Evlyn Kanner MD, FACS Entered By: Evlyn Kanner on 08/29/2015 15:38:56 [name] [name] ([id number]) -------------------------------------------------------------------------------- SuperBill Details Patient Name: [name] [name] Date of Service: 08/29/2015 Medical Record Number: [id number] Patient Account Number: [id number] Date of Birth/Sex: [birth year] (29 y.o. Male) Treating RN: Phillis Haggis Primary Care Physician: Bethann Punches Other Clinician: Referring Physician: Bethann Punches Treating Physician/Extender: Rudene Re in Treatment: 3 Diagnosis Coding ICD-10 Codes Code Description [phone] Pressure ulcer of left heel, stage  2 Z87.820 Personal history of traumatic brain injury Facility Procedures CPT4 Code: [id number] Description: [phone] - WOUND CARE VISIT-LEV 2 EST PT Modifier: Quantity: 1 Physician Procedures CPT4 Code: [id number] Description: 99213 - WC PHYS LEVEL 3 - EST PT ICD-10 Description Diagnosis L89.622 Pressure ulcer of left heel, stage 2 Z87.820 Personal history of traumatic brain injury Modifier: Quantity: 1 Electronic Signature(s) Signed: 08/29/2015 3:33:01 PM By: Evlyn Kanner MD, FACS Signed: 08/30/2015 5:03:09 PM By: Alejandro Mulling Previous Signature: 08/29/2015 2:23:04 PM Version By: Evlyn Kanner MD, FACS Entered By: Alejandro Mulling on 08/29/2015 15:17:30

## 2015-09-04 ENCOUNTER — Ambulatory Visit: Payer: BLUE CROSS/BLUE SHIELD | Admitting: Physical Therapy

## 2015-09-04 ENCOUNTER — Encounter: Payer: BLUE CROSS/BLUE SHIELD | Admitting: Occupational Therapy

## 2015-09-04 ENCOUNTER — Encounter: Payer: BLUE CROSS/BLUE SHIELD | Admitting: Speech Pathology

## 2015-09-05 ENCOUNTER — Telehealth: Payer: Self-pay

## 2015-09-05 ENCOUNTER — Encounter: Payer: Self-pay | Admitting: Physical Therapy

## 2015-09-05 ENCOUNTER — Ambulatory Visit: Payer: 59 | Admitting: Physical Therapy

## 2015-09-05 ENCOUNTER — Ambulatory Visit: Payer: Commercial Managed Care - HMO | Attending: Internal Medicine | Admitting: Speech Pathology

## 2015-09-05 ENCOUNTER — Ambulatory Visit: Payer: Commercial Managed Care - HMO | Admitting: Physical Therapy

## 2015-09-05 ENCOUNTER — Encounter: Payer: 59 | Attending: Internal Medicine | Admitting: Internal Medicine

## 2015-09-05 ENCOUNTER — Ambulatory Visit: Payer: Commercial Managed Care - HMO | Admitting: Occupational Therapy

## 2015-09-05 DIAGNOSIS — R279 Unspecified lack of coordination: Secondary | ICD-10-CM | POA: Insufficient documentation

## 2015-09-05 DIAGNOSIS — Z741 Need for assistance with personal care: Secondary | ICD-10-CM

## 2015-09-05 DIAGNOSIS — Z87820 Personal history of traumatic brain injury: Secondary | ICD-10-CM | POA: Insufficient documentation

## 2015-09-05 DIAGNOSIS — M6281 Muscle weakness (generalized): Secondary | ICD-10-CM

## 2015-09-05 DIAGNOSIS — Z87891 Personal history of nicotine dependence: Secondary | ICD-10-CM | POA: Insufficient documentation

## 2015-09-05 DIAGNOSIS — Z9181 History of falling: Secondary | ICD-10-CM | POA: Diagnosis present

## 2015-09-05 DIAGNOSIS — Z7409 Other reduced mobility: Secondary | ICD-10-CM | POA: Insufficient documentation

## 2015-09-05 DIAGNOSIS — R531 Weakness: Secondary | ICD-10-CM | POA: Insufficient documentation

## 2015-09-05 DIAGNOSIS — Z931 Gastrostomy status: Secondary | ICD-10-CM | POA: Insufficient documentation

## 2015-09-05 DIAGNOSIS — L89622 Pressure ulcer of left heel, stage 2: Secondary | ICD-10-CM | POA: Insufficient documentation

## 2015-09-05 DIAGNOSIS — R460 Very low level of personal hygiene: Secondary | ICD-10-CM | POA: Insufficient documentation

## 2015-09-05 DIAGNOSIS — R633 Feeding difficulties: Secondary | ICD-10-CM | POA: Insufficient documentation

## 2015-09-05 DIAGNOSIS — M24541 Contracture, right hand: Secondary | ICD-10-CM | POA: Diagnosis present

## 2015-09-05 DIAGNOSIS — R278 Other lack of coordination: Secondary | ICD-10-CM | POA: Insufficient documentation

## 2015-09-05 DIAGNOSIS — R471 Dysarthria and anarthria: Secondary | ICD-10-CM | POA: Diagnosis not present

## 2015-09-05 NOTE — Telephone Encounter (Signed)
Pt's mother returned my call and explained that with [facility] denying vest, that secondary insurance will cover in full.  Pt's mother was very grateful and relieved that patient would be able to continue on the incourage vest as she has seen wonderful results with this vest.  Pt has not been in the ED with any respiratory events, is able to talk more and feels better.  Nothing else needed at this time.  Rhonda J Cobb

## 2015-09-05 NOTE — Telephone Encounter (Signed)
Spoke with mom and she is sending a copy of the letter she received form the new insurance stating that the vest was denied. Will await papers from mom. Mom states pt is doing well with the vest. That is has not had any hosptilizations, no resp issues. Pt is talking better and is calling people. Once papers are received we will see what needs to be done and go from there.

## 2015-09-05 NOTE — Telephone Encounter (Signed)
Called [employer] at [phone] spoke with Marquita V.  Pt changed insurance's at the first of year from [facility] to [facility]. [employer] is denying the incourage vest due to the diagnosis code.  [employer] will only cover the vest if there is a diagnosis of Bronchiectasis or CF of which is not the case with patient.  Per Cristal Ford, since [facility] will not cover the device, [employer] will can bill secondary insurance since primary insurance denied and secondary insurance will cover the device.  Called and LMOAM for pt's mother to return my call about this issue.  Rhonda J Cobb

## 2015-09-05 NOTE — Therapy (Signed)
[facility] MAIN [facility] SERVICES [address] [place], [place], [address] Phone: [phone]   Fax:  [phone]  Physical Therapy Treatment  Patient Details  Name: [name] [name] MRN: [id number] Date of Birth: [birth year] Referring Provider: [name] MD  Encounter Date: 09/05/2015      PT End of Session - 09/05/15 1320    Visit Number 2   Number of Visits 25   Date for PT Re-Evaluation 11/16/15   PT Start Time 1100   PT Stop Time 1145   PT Time Calculation (min) 45 min   Activity Tolerance Treatment limited secondary to agitation   Behavior During Therapy Agitated;Anxious      Past Medical History  Diagnosis Date  . Hepatitis C   . Scoliosis   . TBI (traumatic brain injury) (HCC) 07/2011  . Subarachnoid hemorrhage (HCC)   . Pulmonary emboli (HCC)   . Hepatitis C   . Depression     Past Surgical History  Procedure Laterality Date  . Fracture surgery      left acetabulum  . Ileostomy    . Lung tube    . Ankle surgery Bilateral   . Tracheostomy    . Peg tube placement    . Janeway gastrostomy  07/02/2014  . Ivc filter placement ([facility] hx)    . Brain surgery      There were no vitals filed for this visit.  Visit Diagnosis:  Muscular weakness  Risk for falls  Impaired mobility      Subjective Assessment - 09/05/15 1319    Subjective "I am so cold. I don't want to get out of my chair"   Patient is accompained by: Family member   Pertinent History personal factors affecting rehab: chronic condition, high fall risk; LLE hip heterotrophic osscification; impaired cognition, ranchos scale #4 with inappropriate behavior at times;    How long can you sit comfortably? na   How long can you stand comfortably? able to stand for a few seconds with max A; stands in stander for 45 min at a time 2x a day;   How long can you walk comfortably? unable to ambulate at this time   Patient Stated Goals having leg pain, hoping to work on HEP  and mobility; Improve mobility prior to stem cell surgery;    Currently in Pain? No/denies            [facility] PT Assessment - 09/05/15 0001    Strength   Right Hip Flexion 0/5   Right Hip ABduction 0/5   Right Hip ADduction 0/5   Left Hip Flexion 0/5   Left Hip ABduction 0/5   Left Hip ADduction 0/5   Right Knee Flexion 1/5   Right Knee Extension 3-/5   Left Knee Flexion 0/5   Left Knee Extension 0/5   Right Ankle Dorsiflexion 2-/5   Right Ankle Plantar Flexion 2-/5   Left Ankle Dorsiflexion 0/5   Left Ankle Plantar Flexion 0/5        TREATMENT:  PT assessed LE strength, see above;  Attempted to transfer but patient will need hoyer lift as patient's wheelchair has lateral supports and he has no trunk control for slide board transfer; Will attempt using hoyer lift next visit to further assess motion;  PT performed PROM of RLE: Hip flexion/extension x2 reps, patient got very agitated due to increased hip pain from hip dysplasia; PT stopped at patient's request Passive knee flexion/extension x5 reps; Passive ankle DF/PF  x5 reps;  Passive LLE: Knee flexion/extension x5 reps; Ankle DF/PF x5 reps;  PT instructed patient in RLE LAQ AROM x5 reps LLE AAROM (with minimal muscle activation) LAQ x5 reps;  Educated patient on diaphragmatic breathing. Patient able to exhibit good stomach expansion during inspiration but demonstrates decreased core activation and weak exhalation. Would benefit from additional exercise for breath control to improve muscle activation including core stabilization;  RUE: reach with ball to multiple direction to engage core and trunk activation x5 reps; Patient often gets distracted and hallucinates requiring max VCs and demonstration to redirect to task.                       PT Education - 09/05/15 1320    Education provided Yes   Education Details LE exercise, breathing   Person(s) Educated Patient   Methods Explanation;Verbal  cues   Comprehension Verbalized understanding;Returned demonstration;Verbal cues required             PT Long Term Goals - 08/24/15 0909    PT LONG TERM GOAL #1   Title Caregivers will be independent in performing HEP with patient including PROM and AAROM exercise for increased joint flexibility by 11/16/15   Baseline Caregivers are doing some PROM but would benefit from education to improve all joint mobility;   Time 12   Period Weeks   Status New   PT LONG TERM GOAL #2   Title Patient will be able to sit with 1 UE supported only with back unsupported keeping erect posture with supervision for ADLs such as dressing and bathing by 11/16/15   Baseline unable to sit unsupported without assistance;   Time 12   Period Weeks   Status New   PT LONG TERM GOAL #3   Title Patient will be mod A for initiating sit<>stand transfer with LRAD to improve functional mobility in the home by 11/16/15   Baseline requires max A to initiate standing but unable to stand without 2 person assist;   Time 12   Period Weeks   Status New   PT LONG TERM GOAL #4   Title Patient will be able to stand with 1 UE supported with CGA for at least 15 sec or more to improve standing tolerance for ADLs by 11/16/15   Baseline Patient unable to stand without heavy assistance; He is standing in stander for 45 min 2x a day but unable to stand with caregivers;    Time 12   Period Weeks   Status New               Plan - 09/05/15 1325    Clinical Impression Statement Patient is very anxious and distracted during physical therapy; PT assessed LE strength and core strength. He demonstrates decreased tolerance with hip movement. Patient requires frequent verbal and visual cues to redirect to task. He would benefit from additional skilled PT Intervention to improve ROM, flexibility, functional mobility and tolerance with ADLs.    Pt will benefit from skilled therapeutic intervention in order to improve on the following  deficits Abnormal gait;Decreased endurance;Decreased skin integrity;Hypomobility;Impaired tone;Decreased activity tolerance;Decreased knowledge of use of DME;Decreased strength;Increased muscle spasms;Decreased mobility;Decreased balance;Decreased cognition;Impaired perceived functional ability;Decreased safety awareness;Decreased coordination   Rehab Potential Fair   Clinical Impairments Affecting Rehab Potential positive: good caregiver support; negative: chronic condition; Patient's current clinical presentation is unstable as he has impaired congnition, behavioral changes and mobility deficits that change regularly due to TBI presentation;  PT Frequency 2x / week   PT Duration 12 weeks   PT Treatment/Interventions ADLs/Self Care Home Management;Cryotherapy;Electrical Stimulation;Moist Heat;Balance training;Therapeutic exercise;Therapeutic activities;Functional mobility training;Gait training;DME Instruction;Neuromuscular re-education;Patient/family education;Taping;Energy conservation;Passive range of motion   PT Next Visit Plan work on transfers and strength testing   PT Home Exercise Plan will work on next session   Consulted and Agree with Plan of Care Patient;Family member/caregiver   Family Member Consulted mom        Problem List Patient Active Problem List   Diagnosis Date Noted  . Colitis, Clostridium difficile 06/19/2015  . Hypoxia 05/25/2015  . Clostridium difficile infection 05/25/2015  . Mucus plugging of bronchi 05/25/2015  . Low grade fever 05/25/2015  . History of pulmonary embolism 05/25/2015  . OSA on CPAP 05/25/2015  . Pressure ulcer 05/25/2015  . Convulsions/seizures (HCC) 02/10/2015  . Aspiration pneumonia (HCC) 02/07/2015  . Encephalopathy, metabolic 02/07/2015  . Acute respiratory failure (HCC) 01/29/2015  . Skin irritation 11/09/2014  . TBI (traumatic brain injury) (HCC) 02/04/2012  . 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 Heterotopic ossification  02/04/2012    Trotter,Margaret PT, DPT 09/05/2015, 1:33 PM  [facility] MAIN [facility] SERVICES [address] [place], [place], [address] Phone: [phone]   Fax:  [phone]  Name: [name] [name] MRN: [id number] Date of Birth: 1986-02-23

## 2015-09-05 NOTE — Telephone Encounter (Signed)
Pt mother called, states she has received a denial for pt equipment that was ordered. States they have changed insurance companies. Please call.

## 2015-09-06 ENCOUNTER — Encounter: Payer: Self-pay | Admitting: Speech Pathology

## 2015-09-06 NOTE — Therapy (Signed)
[place] [facility] MAIN [facility] SERVICES [address] [place], [place], [address] Phone: [phone]   Fax:  [phone]  Occupational Therapy Treatment  Patient Details  Name: [name] [name] MRN: [id number] Date of Birth: [birth year] Referring Provider: Lysbeth Penner  Encounter Date: 09/05/2015      OT End of Session - 09/06/15 1754    Visit Number 2   Number of Visits 8   Date for OT Re-Evaluation 11/15/15   OT Start Time 1000   OT Stop Time 1046   OT Time Calculation (min) 46 min   Activity Tolerance Patient tolerated treatment well;Treatment limited secondary to agitation   Behavior During Therapy Agitated;Anxious      Past Medical History  Diagnosis Date  . Hepatitis C   . Scoliosis   . TBI (traumatic brain injury) (HCC) 07/2011  . Subarachnoid hemorrhage (HCC)   . Pulmonary emboli (HCC)   . Hepatitis C   . Depression     Past Surgical History  Procedure Laterality Date  . Fracture surgery      left acetabulum  . Ileostomy    . Lung tube    . Ankle surgery Bilateral   . Tracheostomy    . Peg tube placement    . Janeway gastrostomy  07/02/2014  . Ivc filter placement ([facility] hx)    . Brain surgery      There were no vitals filed for this visit.  Visit Diagnosis:  Muscular weakness  Incoordination of extremity  Contracture of finger joint, right  Self-care deficit for hygiene      Subjective Assessment - 09/06/15 1751    Subjective  Patient reports if he does good in therapy today, he can go to [place].   Patient is accompained by: Family member   Pertinent History TBI 2013    Patient Stated Goals Patient wants to be able to do more for himself, use his phone better.     Currently in Pain? Yes   Pain Score 4    Pain Location Hand   Pain Orientation Left   Pain Descriptors / Indicators Aching   Pain Type Acute pain   Pain Onset More than a month ago   Pain Frequency Intermittent   Aggravating Factors  Any stretching or  movement of left UE produces pain in the hand and digits.   Pain Relieving Factors occasion rest breaks during treatment, shift in focus   Multiple Pain Sites No                      OT Treatments/Exercises (OP) - 09/06/15 0001    ADLs   ADL Comments Patient seen for hygiene tasks of washing face and hands with right UE, set up for supplies and cues provided by therapist for thoroughness.     Neurological Re-education Exercises   Other Exercises 1 Patient seen for RUE reaching tasks in multi directions with therapist guiding and cues.  PROM to LUE for digits and wrist for flexion/extension with prolonged stretching as patient tolerated.  Instructed caregiver on ROM.  Patient seen for PROM of left shoulder, elbow and forearm, patient able to tolerate these motions more than wrist and hand.    Other Exercises 2 AAROM with 1# dowel with therapist assist to place left hand on dowel and provide guiding technique for ROM, patient does better with this approach to ROM since he is more actively participating.  OT Education - 09/06/15 1753    Education provided Yes   Education Details ROM, use of right arm for functional tasks, stretching of left hand and UE to prevent further contractures and for hygiene.   Person(s) Educated Patient;Caregiver(s)   Methods Explanation;Demonstration;Verbal cues   Comprehension Verbalized understanding;Returned demonstration;Verbal cues required             OT Long Term Goals - 08/23/15 1628    OT LONG TERM GOAL #1   Title Patient will increase active motion in R UE sufficient to self feed with or with out assistive devices.   Baseline Unable to feed self any more.   Time 12   Period Weeks   Status New   OT LONG TERM GOAL #2   Title Patient will show improved upper extremity function to be able to use cell phone.   Baseline unable to use cell phone   Time 12   Period Weeks   Status New   OT LONG TERM GOAL #3    Title Patient will improve upper extremity function to be able to be able to wash his face with or with out assistive devices.   Baseline Unable to wash face.   Time 12   Period Days   Status New   OT LONG TERM GOAL #4   Title Will improve R UE range of motion to be able to bruch teeth with or with out assistive devices.   Baseline Unable to brush teeth.   Time 12   Period Weeks   Status New   OT LONG TERM GOAL #5   Title Will be able to positioin L UE to prevent further contractures and skin break down.   Baseline Patient's hand is very tight and arm is stuck in synergy.   Time 12   Period Weeks   Status New               Plan - 09/06/15 1758    Clinical Impression Statement Patient became agitated at times but was able to be redirected with alternating tasks.  He often resists ROM of either UE however worse on the left than the right.  He voices when he doesn't want to perform specific tasks.  Patient requires guiding from therapist as well as cues for purposeful use of right UE for self care tasks. He performs best with distraction during ROM.   Pt will benefit from skilled therapeutic intervention in order to improve on the following deficits (Retired) Decreased activity tolerance;Decreased coordination;Decreased endurance;Decreased range of motion;Decreased skin integrity;Decreased strength;Impaired UE functional use;Impaired tone   Rehab Potential Fair   Clinical Impairments Affecting Rehab Potential Has an aide, TBI and co-morbidities, limitted ability to participate   OT Frequency 1x / week   OT Duration 8 weeks   OT Treatment/Interventions Self-care/ADL training;Neuromuscular education;Therapeutic exercise;Energy conservation;Manual Therapy;Therapeutic activities;Therapeutic exercises;Splinting;Passive range of motion;Patient/family education   Consulted and Agree with Plan of Care Family member/caregiver;Patient   Family Member Consulted cargiver        Problem  List Patient Active Problem List   Diagnosis Date Noted  . Colitis, Clostridium difficile 06/19/2015  . Hypoxia 05/25/2015  . Clostridium difficile infection 05/25/2015  . Mucus plugging of bronchi 05/25/2015  . Low grade fever 05/25/2015  . History of pulmonary embolism 05/25/2015  . OSA on CPAP 05/25/2015  . Pressure ulcer 05/25/2015  . Convulsions/seizures (HCC) 02/10/2015  . Aspiration pneumonia (HCC) 02/07/2015  . Encephalopathy, metabolic 02/07/2015  . Acute respiratory failure (HCC) 01/29/2015  .  Skin irritation 11/09/2014  . TBI (traumatic brain injury) (HCC) 02/04/2012  . 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 Heterotopic ossification 02/04/2012   Dashanique Brownstein T Arne Cleveland, OTR/L, CLT  Rayley Gao 09/06/2015, 6:08 PM  [place] [facility] MAIN [facility] SERVICES [address] [place], [place], [address] Phone: [phone]   Fax:  [phone]  Name: [name] [name] MRN: [id number] Date of Birth: [birth year]

## 2015-09-06 NOTE — Therapy (Signed)
[place] MAIN [facility] SERVICES [address] [place], [place], [address] Phone: [phone]   Fax:  [phone]  Speech Language Pathology Treatment/Discharge Summary  Patient Details  Name: [name] [name] MRN: [id number] Date of Birth: Mar 31, 1986 Referring Provider: Dr. Sabra Heck  Encounter Date: 09/05/2015      End of Session - 09/06/15 1147    Visit Number 7   Number of Visits 8   Date for SLP Re-Evaluation 09/13/15   SLP Start Time 0910   SLP Stop Time  0957   SLP Time Calculation (min) 47 min   Activity Tolerance Treatment limited secondary to agitation      Past Medical History  Diagnosis Date  . Hepatitis C   . Scoliosis   . TBI (traumatic brain injury) ([place]) 07/2011  . Subarachnoid hemorrhage ([place])   . Pulmonary emboli ([place])   . Hepatitis C   . Depression     Past Surgical History  Procedure Laterality Date  . Fracture surgery      left acetabulum  . Ileostomy    . Lung tube    . Ankle surgery Bilateral   . Tracheostomy    . Peg tube placement    . Janeway gastrostomy  07/02/2014  . Ivc filter placement ([facility] hx)    . Brain surgery      There were no vitals filed for this visit.  Visit Diagnosis: Dysarthria      Subjective Assessment - 09/06/15 1145    Subjective The patient continues to be inconsistently cooperative with structured tasks.    Currently in Pain? No/denies               ADULT SLP TREATMENT - 09/06/15 0001    General Information   Behavior/Cognition Agitated   HPI TBI 07/2011   Treatment Provided   Treatment provided Cognitive-Linquistic   Pain Assessment   Pain Assessment No/denies pain   Cognitive-Linquistic Treatment   Treatment focused on Dysarthria   Skilled Treatment The patient was less agitated with improved participation in direct therapy.  Patient completed 5 reps of 16 of 16 tongue, lip, jaw, and voice exercises.  Caregiver education was provided. Review of exercises for the  tongue, lips, jaw, and voice with targeted responses identified (eg. promoting range vs. strength). Reviewed potential substitute movements/activities to elicit given targeted response. Reviewed the concept of high effort/high intensity vocal exercises   Assessment / Recommendations / Plan   Plan Discharge SLP treatment due to (comment)   Progression Toward Goals   Progression toward goals Goals met, education completed, patient discharged from SLP          SLP Education - 09/06/15 1146    Education provided Yes   Education Details exercises for the tongue, lips, jaw, and voice; reminder that the best exercise for speech IS speech; high effort/high intensity vocal exercises   Person(s) Educated Patient;Caregiver(s)   Methods Explanation   Comprehension Verbalized understanding            SLP Long Term Goals - 09/06/15 1154    SLP LONG TERM GOAL #1   Title Pt will improve oral motor strength / coordination for speech and swallowing by completing oral motor exercises with assistance from trained caregivers.   Status Partially Met   SLP LONG TERM GOAL #2   Title Pt will improve breath support for speech and swallowing by completing breath support exercises with assistance from trained caregivers.   Status Partially Met  Plan - 09/06/15 1147    Clinical Impression Statement The patient is inconsistently cooperative with standard oral motor exercises even given max encouragement from SLP and caregiver.  The care giver with the patient today is capable of encouraging maximum responses from the patient, as is his sister who came to 2 sessions.  High effort/high intensity vocal exercises will improve the patient's speech, voice, and swallowing.   Speech Therapy Frequency 1x /week   Duration Other (comment)   Treatment/Interventions Oral motor exercises;Other (comment);Patient/family education   Potential Considerations Ability to learn/carryover  information;Cooperation/participation level;Severity of impairments   SLP Home Exercise Plan exercises for the tongue, lips, jaw, and voice; reminder that the best exercise for speech IS speech; high effort/high intensity vocal exercises   Consulted and Agree with Plan of Care Patient;Family member/caregiver   Family Member Consulted Caregiver        Problem List Patient Active Problem List   Diagnosis Date Noted  . Colitis, Clostridium difficile 06/19/2015  . Hypoxia 05/25/2015  . Clostridium difficile infection 05/25/2015  . Mucus plugging of bronchi 05/25/2015  . Low grade fever 05/25/2015  . History of pulmonary embolism 05/25/2015  . OSA on CPAP 05/25/2015  . Pressure ulcer 05/25/2015  . Convulsions/seizures ([place]) 02/10/2015  . Aspiration pneumonia ([place]) 02/07/2015  . Encephalopathy, metabolic 37/00/5259  . Acute respiratory failure ([place]) 01/29/2015  . Skin irritation 11/09/2014  . TBI (traumatic brain injury) ([place]) 02/04/2012  . Spastic tetraplegia ([place]) 02/04/2012  . Heterotopic ossification 02/04/2012   Leroy Sea, MS/CCC- SLP  Lou Miner 09/06/2015, 11:55 AM  [place] MAIN [facility] SERVICES [address] [place], [place], [address] Phone: [phone]   Fax:  [phone]   Name: [name] [name] MRN: [id number] Date of Birth: 10/07/1985

## 2015-09-07 ENCOUNTER — Ambulatory Visit: Payer: BLUE CROSS/BLUE SHIELD | Admitting: Physical Therapy

## 2015-09-07 ENCOUNTER — Encounter: Payer: Self-pay | Admitting: Physical Therapy

## 2015-09-07 ENCOUNTER — Ambulatory Visit: Payer: Commercial Managed Care - HMO | Admitting: Physical Therapy

## 2015-09-07 ENCOUNTER — Encounter: Payer: BLUE CROSS/BLUE SHIELD | Admitting: Occupational Therapy

## 2015-09-07 DIAGNOSIS — Z9181 History of falling: Secondary | ICD-10-CM

## 2015-09-07 DIAGNOSIS — Z7409 Other reduced mobility: Secondary | ICD-10-CM

## 2015-09-07 DIAGNOSIS — M6281 Muscle weakness (generalized): Secondary | ICD-10-CM

## 2015-09-07 DIAGNOSIS — R471 Dysarthria and anarthria: Secondary | ICD-10-CM | POA: Diagnosis not present

## 2015-09-08 ENCOUNTER — Encounter: Payer: 59 | Admitting: Speech Pathology

## 2015-09-08 NOTE — Progress Notes (Signed)
[name] [name], [name] [name] ([id number]) Visit Report for 09/05/2015 Chief Complaint Document Details Patient Name: [name] [name], [name] [name] Date of Service: 09/05/2015 1:30 PM Medical Record Number: [id number] Patient Account Number: [id number] Date of Birth/Sex: [birth year] (29 y.o. Male) Treating RN: Phillis Haggis Primary Care Physician: Bethann Punches Other Clinician: Referring Physician: Bethann Punches Treating Physician/Extender: Altamese Warner in Treatment: 4 Information Obtained from: Caregiver Chief Complaint Patient is at the clinic for treatment of an open pressure ulcer to his left heel which she's had for about 4-5 months Electronic Signature(s) Signed: 09/05/2015 3:38:56 PM By: Baltazar Najjar MD Entered By: Baltazar Najjar on 09/05/2015 14:31:55 [name] [name] ([id number]) -------------------------------------------------------------------------------- HPI Details Patient Name: [name] [name] Date of Service: 09/05/2015 1:30 PM Medical Record Number: [id number] Patient Account Number: [id number] Date of Birth/Sex: [birth year] (29 y.o. Male) Treating RN: Phillis Haggis Primary Care Physician: Bethann Punches Other Clinician: Referring Physician: Bethann Punches Treating Physician/Extender: Maxwell Caul Weeks in Treatment: 4 History of Present Illness Location: left heel Quality: Patient reports No Pain. Severity: Patient states wound are getting worse. Duration: Patient has had the wound for > 3 months prior to seeking treatment at the wound center Context: The wound appeared gradually over time Modifying Factors: Other treatment(s) tried include:local care with some silver and zinc paste HPI Description: 30 year old male who has a history of traumatic brain injury and also has a ulcer on his left heel which was noted by the family about 4-5 months ago. The TBI was in January 2013 and prior to this visit the patient has had hyperbaric oxygen therapy approximately 2 years after his TBI. He is had  several gastrostomy tube placed by the gastroenterologist but then finally had a surgical procedure to bring the gastric mucosa to the skin surface. This was done approximately 9 months ago. Past medical history of hepatitis C, traumatic brain injury, pulmonary emboli, ileostomy, tracheostomy, PEG tube placement. Was a former smoker and quit in January 2013 09/05/15; this is a 30 year old man who has a history of traumatic brain injury. He has fed through a PEG tube. He has a pressure ulcer on the left heel which she's had for many months. Currently they've been using Santyl to the heel wound which had a surface eschar. There are returning today in follow-up Electronic Signature(s) Signed: 09/05/2015 3:38:56 PM By: Baltazar Najjar MD Entered By: Baltazar Najjar on 09/05/2015 14:33:28 [name] [name] ([id number]) -------------------------------------------------------------------------------- Physical Exam Details Patient Name: [name] [name] Date of Service: 09/05/2015 1:30 PM Medical Record Number: [id number] Patient Account Number: [id number] Date of Birth/Sex: 09/20/1985 (29 y.o. Male) Treating RN: Phillis Haggis Primary Care Physician: Bethann Punches Other Clinician: Referring Physician: Bethann Punches Treating Physician/Extender: Maxwell Caul Weeks in Treatment: 4 Notes Wound exam; the wound on the Achilles area of the left heel has a nice healthy-looking base. There was nothing that required debridement here. In fact I think the Melburn Popper has done its job. Electronic Signature(s) Signed: 09/05/2015 3:38:56 PM By: Baltazar Najjar MD Entered By: Baltazar Najjar on 09/05/2015 14:34:23 [name] [name] ([id number]) -------------------------------------------------------------------------------- Physician Orders Details Patient Name: [name] [name] Date of Service: 09/05/2015 1:30 PM Medical Record Number: [id number] Patient Account Number: [id number] Date of Birth/Sex: 05-11-1986 (29 y.o. Male) Treating  RN: Phillis Haggis Primary Care Physician: Bethann Punches Other Clinician: Referring Physician: Bethann Punches Treating Physician/Extender: Altamese Bronson in Treatment: 4 Verbal / Phone Orders: Yes ClinicianAshok Cordia, Debi Read Back and Verified: Yes Diagnosis Coding Wound Cleansing Wound #1 Left Calcaneus o Clean wound with Normal Saline. Anesthetic Wound #1  Left Calcaneus o Topical Lidocaine 4% cream applied to wound bed prior to debridement Skin Barriers/Peri-Wound Care Wound #1 Left Calcaneus o Skin Prep Primary Wound Dressing Wound #1 Left Calcaneus o Prisma Ag Secondary Dressing o Boardered Foam Dressing Dressing Change Frequency Wound #1 Left Calcaneus o Change dressing every other day. Follow-up Appointments Wound #1 Left Calcaneus o Return Appointment in 1 week. Electronic Signature(s) Signed: 09/05/2015 3:38:56 PM By: Baltazar Najjar MD Signed: 09/07/2015 5:51:12 PM By: Alejandro Mulling Entered By: Alejandro Mulling on 09/05/2015 14:29:11 [name] [name] ([id number]) -------------------------------------------------------------------------------- Problem List Details Patient Name: [name] [name] Date of Service: 09/05/2015 1:30 PM Medical Record Number: [id number] Patient Account Number: [id number] Date of Birth/Sex: 1986-02-18 (29 y.o. Male) Treating RN: Phillis Haggis Primary Care Physician: Bethann Punches Other Clinician: Referring Physician: Bethann Punches Treating Physician/Extender: Altamese [place] in Treatment: 4 Active Problems ICD-10 Encounter Code Description Active Date Diagnosis L89.622 Pressure ulcer of left heel, stage 2 08/08/2015 Yes Z87.820 Personal history of traumatic brain injury 08/08/2015 Yes Inactive Problems Resolved Problems Electronic Signature(s) Signed: 09/05/2015 3:38:56 PM By: Baltazar Najjar MD Entered By: Baltazar Najjar on 09/05/2015 14:31:35 [name] [name]  ([id number]) -------------------------------------------------------------------------------- Progress Note Details Patient Name: [name] [name] Date of Service: 09/05/2015 1:30 PM Medical Record Number: [id number] Patient Account Number: [id number] Date of Birth/Sex: Jun 15, 1986 (29 y.o. Male) Treating RN: Phillis Haggis Primary Care Physician: Bethann Punches Other Clinician: Referring Physician: Bethann Punches Treating Physician/Extender: Altamese V[place]en in Treatment: 4 Subjective Chief Complaint Information obtained from Caregiver Patient is at the clinic for treatment of an open pressure ulcer to his left heel which she's had for about 4-5 months History of Present Illness (HPI) The following HPI elements were documented for the patient's wound: Location: left heel Quality: Patient reports No Pain. Severity: Patient states wound are getting worse. Duration: Patient has had the wound for > 3 months prior to seeking treatment at the wound center Context: The wound appeared gradually over time Modifying Factors: Other treatment(s) tried include:local care with some silver and zinc paste 30 year old male who has a history of traumatic brain injury and also has a ulcer on his left heel which was noted by the family about 4-5 months ago. The TBI was in January 2013 and prior to this visit the patient has had hyperbaric oxygen therapy approximately 2 years after his TBI. He is had several gastrostomy tube placed by the gastroenterologist but then finally had a surgical procedure to bring the gastric mucosa to the skin surface. This was done approximately 9 months ago. Past medical history of hepatitis C, traumatic brain injury, pulmonary emboli, ileostomy, tracheostomy, PEG tube placement. Was a former smoker and quit in January 2013 09/05/15; this is a 30 year old man who has a history of traumatic brain injury. He has fed through a PEG tube. He has a pressure ulcer on the left heel which  she's had for many months. Currently they've been using Santyl to the heel wound which had a surface eschar. There are returning today in follow-up Objective Constitutional Vitals Time Taken: 1:53 PM, Height: 72 in, Weight: 200 lbs, BMI: 27.1, Temperature: 98.0 F, Pulse: 69 bpm, Respiratory Rate: 18 breaths/min, Blood Pressure: 103/71 mmHg. [name] [name], [name] [name] ([id number]) Integumentary (Hair, Skin) Wound #1 status is Open. Original cause of wound was Gradually Appeared. The wound is located on the Left Calcaneus. The wound measures 1.6cm length x 0.8cm width x 0.1cm depth; 1.005cm^2 area and 0.101cm^3 volume. The wound is limited to skin breakdown. There is no tunneling or  undermining noted. There is a large amount of serosanguineous drainage noted. The wound margin is distinct with the outline attached to the wound base. There is medium (34-66%) pink, pale granulation within the wound bed. There is a medium (34-66%) amount of necrotic tissue within the wound bed including Adherent Slough. The periwound skin appearance exhibited: Localized Edema, Moist. The periwound skin appearance did not exhibit: Callus, Crepitus, Excoriation, Fluctuance, Friable, Induration, Rash, Scarring, Dry/Scaly, Maceration, Atrophie Blanche, Cyanosis, Ecchymosis, Hemosiderin Staining, Mottled, Pallor, Rubor, Erythema. Periwound temperature was noted as No Abnormality. The periwound has tenderness on palpation. Assessment Active Problems ICD-10 N56.213 - Pressure ulcer of left heel, stage 2 Z87.820 - Personal history of traumatic brain injury Plan Wound Cleansing: Wound #1 Left Calcaneus: Clean wound with Normal Saline. Anesthetic: Wound #1 Left Calcaneus: Topical Lidocaine 4% cream applied to wound bed prior to debridement Skin Barriers/Peri-Wound Care: Wound #1 Left Calcaneus: Skin Prep Primary Wound Dressing: Wound #1 Left Calcaneus: Prisma Ag Secondary Dressing: Boardered Foam Dressing Dressing Change  Frequency: Wound #1 Left Calcaneus: Change dressing every other day. Follow-up Appointments: [name] [name], [name] [name] ([id number]) Wound #1 Left Calcaneus: Return Appointment in 1 week. #1 the wound bed looked healthy enough to discontinue the sample at this point and apply a silver collagen hydrogel border foam based dressings. We had some discussion about the best way to get supplies into his home. Apparently his mother's been trying to get supplies into the home without much success. Over the phone she says this is not because home health is going into her home. We will order collagen and foam through [facility]. This can be changed every second day however I told them that this could be left on longer if the dressing doesn't get unnecessarily wet or soiled. We will have another look at this next week. I also discussed pressure relieved with the patient's caregiver who accompanied him Electronic Signature(s) Signed: 09/05/2015 3:38:56 PM By: Baltazar Najjar MD Entered By: Baltazar Najjar on 09/05/2015 14:36:19 [name] [name] ([id number]) -------------------------------------------------------------------------------- SuperBill Details Patient Name: [name] [name] Date of Service: 09/05/2015 Medical Record Number: [id number] Patient Account Number: [id number] Date of Birth/Sex: 1986-05-10 (29 y.o. Male) Treating RN: Phillis Haggis Primary Care Physician: Bethann Punches Other Clinician: Referring Physician: Bethann Punches Treating Physician/Extender: Maxwell Caul Weeks in Treatment: 4 Diagnosis Coding ICD-10 Codes Code Description [phone] Pressure ulcer of left heel, stage 2 Z87.820 Personal history of traumatic brain injury Facility Procedures CPT4 Code: [id number] Description: [phone] - WOUND CARE VISIT-LEV 2 EST PT Modifier: Quantity: 1 Physician Procedures CPT4 Code: [id number] Description: 99212 - WC PHYS LEVEL 2 - EST PT ICD-10 Description Diagnosis L89.622 Pressure ulcer of left heel, stage  2 Modifier: Quantity: 1 Electronic Signature(s) Signed: 09/05/2015 3:38:56 PM By: Baltazar Najjar MD Signed: 09/07/2015 5:51:12 PM By: Alejandro Mulling Entered By: Alejandro Mulling on 09/05/2015 15:13:01

## 2015-09-08 NOTE — Therapy (Signed)
[facility] MAIN [facility] SERVICES [address] [place], [place], [address] Phone: [phone]   Fax:  [phone]  Physical Therapy Treatment  Patient Details  Name: [name] [name] MRN: [id number] Date of Birth: 26-Apr-1986 Referring Provider: [name] MD  Encounter Date: 09/07/2015      PT End of Session - 09/08/15 0747    Visit Number 3   Number of Visits 25   Date for PT Re-Evaluation 11/16/15   PT Start Time 1430   PT Stop Time 1515   PT Time Calculation (min) 45 min   Activity Tolerance Treatment limited secondary to agitation   Behavior During Therapy Agitated;Anxious      Past Medical History  Diagnosis Date  . Hepatitis C   . Scoliosis   . TBI (traumatic brain injury) (HCC) 07/2011  . Subarachnoid hemorrhage (HCC)   . Pulmonary emboli (HCC)   . Hepatitis C   . Depression     Past Surgical History  Procedure Laterality Date  . Fracture surgery      left acetabulum  . Ileostomy    . Lung tube    . Ankle surgery Bilateral   . Tracheostomy    . Peg tube placement    . [place] gastrostomy  07/02/2014  . Ivc filter placement ([facility] hx)    . Brain surgery      There were no vitals filed for this visit.  Visit Diagnosis:  Muscular weakness  Risk for falls  Impaired mobility      Subjective Assessment - 09/07/15 1555    Subjective Patient reports feeling cold. He also wants to stay in his chair. His caregiver reports that he needs to do more exercise and said to try and work him a little harder today.   Patient is accompained by: Family member   Pertinent History personal factors affecting rehab: chronic condition, high fall risk; LLE hip heterotrophic osscification; impaired cognition, ranchos scale #4 with inappropriate behavior at times;    How long can you sit comfortably? na   How long can you stand comfortably? able to stand for a few seconds with max A; stands in stander for 45 min at a time 2x a day;   How  long can you walk comfortably? unable to ambulate at this time   Patient Stated Goals having leg pain, hoping to work on HEP and mobility; Improve mobility prior to stem cell surgery;    Currently in Pain? No/denies  no pain at rest     TREATMENT: PT used hoyer lift to get patient from wheelchair<>mat table x2 reps;  Patient is max A +2 for positioning on mat table; PT bent right knee to mat table; Educated patient in reaching with RUE and pushing through right leg to log roll to left side and then reach back and roll backwards to back x5 reps;  Patient is max A +2 to move to sitting edge of mat table: Reaching in multiple directions with RUE x2-3 min with max A +1 to maintain sitting positioning; Pulling with RUE on wand to pull into sitting and then relax reclined x5 reps;  PT educated caregiver in sitting tasks to perform at home to initiate trunk movement for better trunk control. Patient continues to perseverate on discomfort and hallucinates during PT treatment requiring max VCs to redirect to task.  PT Education - 09/08/15 0747    Education provided Yes   Education Details bed mobility, sitting exercise   Person(s) Educated Patient   Methods Explanation;Verbal cues   Comprehension Verbalized understanding;Returned demonstration;Verbal cues required             PT Long Term Goals - 08/24/15 0909    PT LONG TERM GOAL #1   Title Caregivers will be independent in performing HEP with patient including PROM and AAROM exercise for increased joint flexibility by 11/16/15   Baseline Caregivers are doing some PROM but would benefit from education to improve all joint mobility;   Time 12   Period Weeks   Status New   PT LONG TERM GOAL #2   Title Patient will be able to sit with 1 UE supported only with back unsupported keeping erect posture with supervision for ADLs such as dressing and bathing by 11/16/15   Baseline unable to sit  unsupported without assistance;   Time 12   Period Weeks   Status New   PT LONG TERM GOAL #3   Title Patient will be mod A for initiating sit<>stand transfer with LRAD to improve functional mobility in the home by 11/16/15   Baseline requires max A to initiate standing but unable to stand without 2 person assist;   Time 12   Period Weeks   Status New   PT LONG TERM GOAL #4   Title Patient will be able to stand with 1 UE supported with CGA for at least 15 sec or more to improve standing tolerance for ADLs by 11/16/15   Baseline Patient unable to stand without heavy assistance; He is standing in stander for 45 min 2x a day but unable to stand with caregivers;    Time 12   Period Weeks   Status New               Plan - 09/08/15 0747    Clinical Impression Statement Patient continues to be anxious during treatment session despite private treatment area and having caregiver around. PT instructed patient in bed mobility, rolling to left to improve functional movement with RUE/RLE. Patient required max verbal and tactile cues for orientation and follow through of task. Patient continues to hallucinate and gets distracted easily. Patient is max A+2 for supine to sitting edge of mat table. Instructed patient and caregiver in sitting tasks to perform including reaching outside base of support, forward/backward trunk movement to facilitate trunk activation. He would benefit from additional skilled PT Intervention to improve functional mobility and strength.    Pt will benefit from skilled therapeutic intervention in order to improve on the following deficits Abnormal gait;Decreased endurance;Decreased skin integrity;Hypomobility;Impaired tone;Decreased activity tolerance;Decreased knowledge of use of DME;Decreased strength;Increased muscle spasms;Decreased mobility;Decreased balance;Decreased cognition;Impaired perceived functional ability;Decreased safety awareness;Decreased coordination   Rehab  Potential Fair   Clinical Impairments Affecting Rehab Potential positive: good caregiver support; negative: chronic condition; Patient's current clinical presentation is unstable as he has impaired congnition, behavioral changes and mobility deficits that change regularly due to TBI presentation;    PT Frequency 2x / week   PT Duration 12 weeks   PT Treatment/Interventions ADLs/Self Care Home Management;Cryotherapy;Electrical Stimulation;Moist Heat;Balance training;Therapeutic exercise;Therapeutic activities;Functional mobility training;Gait training;DME Instruction;Neuromuscular re-education;Patient/family education;Taping;Energy conservation;Passive range of motion   PT Next Visit Plan work on bed mobility; advance sitting exercise   PT Home Exercise Plan initiated- sitting exercise   Consulted and Agree with Plan of Care Patient;Family member/caregiver   Family Member Consulted mom  Problem List Patient Active Problem List   Diagnosis Date Noted  . Colitis, Clostridium difficile 06/19/2015  . Hypoxia 05/25/2015  . Clostridium difficile infection 05/25/2015  . Mucus plugging of bronchi 05/25/2015  . Low grade fever 05/25/2015  . History of pulmonary embolism 05/25/2015  . OSA on CPAP 05/25/2015  . Pressure ulcer 05/25/2015  . Convulsions/seizures (HCC) 02/10/2015  . Aspiration pneumonia (HCC) 02/07/2015  . Encephalopathy, metabolic 02/07/2015  . Acute respiratory failure (HCC) 01/29/2015  . Skin irritation 11/09/2014  . TBI (traumatic brain injury) (HCC) 02/04/2012  . 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 Heterotopic ossification 02/04/2012    Aryan Sparks PT, DPT 09/08/2015, 7:50 AM  [facility] MAIN [facility] SERVICES [address] [place], [place], [address] Phone: [phone]   Fax:  [phone]  Name: [name] [name] MRN: [id number] Date of Birth: 1985-10-01

## 2015-09-08 NOTE — Progress Notes (Signed)
[name] [name], [name] [name] ([id number]) Visit Report for 09/05/2015 Arrival Information Details Patient Name: [name] [name], [name] [name] Date of Service: 09/05/2015 1:30 PM Medical Record Number: [id number] Patient Account Number: [id number] Date of Birth/Sex: [birth year] (29 y.o. Male) Treating RN: Phillis Haggis Primary Care Physician: Bethann Punches Other Clinician: Referring Physician: Bethann Punches Treating Physician/Extender: Altamese [place] in Treatment: 4 Visit Information History Since Last Visit All ordered tests and consults were completed: No Patient Arrived: Wheel Chair Added or deleted any medications: No Arrival Time: 13:52 Any new allergies or adverse reactions: No Accompanied By: caregiver Had a fall or experienced change in No activities of daily living that may affect Transfer Assistance: None risk of falls: Patient Requires Transmission-Based No Signs or symptoms of abuse/neglect since last No Precautions: visito Patient Has Alerts: No Hospitalized since last visit: No Pain Present Now: No Electronic Signature(s) Signed: 09/07/2015 5:51:12 PM By: Alejandro Mulling Entered By: Alejandro Mulling on 09/05/2015 13:52:51 [name] [name] ([id number]) -------------------------------------------------------------------------------- Clinic Level of Care Assessment Details Patient Name: [name] [name] Date of Service: 09/05/2015 1:30 PM Medical Record Number: [id number] Patient Account Number: [id number] Date of Birth/Sex: [birth year] (29 y.o. Male) Treating RN: Phillis Haggis Primary Care Physician: Bethann Punches Other Clinician: Referring Physician: Bethann Punches Treating Physician/Extender: Altamese  in Treatment: 4 Clinic Level of Care Assessment Items TOOL 4 Quantity Score []  - Use when only an EandM is performed on FOLLOW-UP visit 0 ASSESSMENTS - Nursing Assessment / Reassessment []  - Reassessment of Co-morbidities (includes updates in patient status) 0 X - Reassessment of  Adherence to Treatment Plan 1 5 ASSESSMENTS - Wound and Skin Assessment / Reassessment X - Simple Wound Assessment / Reassessment - one wound 1 5 []  - Complex Wound Assessment / Reassessment - multiple wounds 0 []  - Dermatologic / Skin Assessment (not related to wound area) 0 ASSESSMENTS - Focused Assessment []  - Circumferential Edema Measurements - multi extremities 0 []  - Nutritional Assessment / Counseling / Intervention 0 []  - Lower Extremity Assessment (monofilament, tuning fork, pulses) 0 []  - Peripheral Arterial Disease Assessment (using hand held doppler) 0 ASSESSMENTS - Ostomy and/or Continence Assessment and Care []  - Incontinence Assessment and Management 0 []  - Ostomy Care Assessment and Management (repouching, etc.) 0 PROCESS - Coordination of Care X - Simple Patient / Family Education for ongoing care 1 15 []  - Complex (extensive) Patient / Family Education for ongoing care 0 []  - Staff obtains Chiropractor, Records, Test Results / Process Orders 0 []  - Staff telephones HHA, Nursing Homes / Clarify orders / etc 0 []  - Routine Transfer to another Facility (non-emergent condition) 0 [name] [name], [name] [name] ([id number]) []  - Routine Hospital Admission (non-emergent condition) 0 []  - New Admissions / Manufacturing engineer / Ordering NPWT, Apligraf, etc. 0 []  - Emergency Hospital Admission (emergent condition) 0 X - Simple Discharge Coordination 1 10 []  - Complex (extensive) Discharge Coordination 0 PROCESS - Special Needs []  - Pediatric / Minor Patient Management 0 []  - Isolation Patient Management 0 []  - Hearing / Language / Visual special needs 0 []  - Assessment of Community assistance (transportation, D/C planning, etc.) 0 []  - Additional assistance / Altered mentation 0 []  - Support Surface(s) Assessment (bed, cushion, seat, etc.) 0 INTERVENTIONS - Wound Cleansing / Measurement X - Simple Wound Cleansing - one wound 1 5 []  - Complex Wound Cleansing - multiple wounds 0 X - Wound  Imaging (photographs - any number of wounds) 1 5 []  - Wound Tracing (instead of photographs) 0 X - Simple Wound Measurement -  one wound 1 5 []  - Complex Wound Measurement - multiple wounds 0 INTERVENTIONS - Wound Dressings X - Small Wound Dressing one or multiple wounds 1 10 []  - Medium Wound Dressing one or multiple wounds 0 []  - Large Wound Dressing one or multiple wounds 0 X - Application of Medications - topical 1 5 []  - Application of Medications - injection 0 INTERVENTIONS - Miscellaneous []  - External ear exam 0 [name] [name], [name] [name] ([id number]) []  - Specimen Collection (cultures, biopsies, blood, body fluids, etc.) 0 []  - Specimen(s) / Culture(s) sent or taken to Lab for analysis 0 []  - Patient Transfer (multiple staff / Michiel Sites Lift / Similar devices) 0 []  - Simple Staple / Suture removal (25 or less) 0 []  - Complex Staple / Suture removal (26 or more) 0 []  - Hypo / Hyperglycemic Management (close monitor of Blood Glucose) 0 []  - Ankle / Brachial Index (ABI) - do not check if billed separately 0 X - Vital Signs 1 5 Has the patient been seen at the hospital within the last three years: Yes Total Score: 70 Level Of Care: New/Established - Level 2 Electronic Signature(s) Signed: 09/07/2015 5:51:12 PM By: Alejandro Mulling Entered By: Alejandro Mulling on 09/05/2015 15:12:49 [name] [name] ([id number]) -------------------------------------------------------------------------------- Encounter Discharge Information Details Patient Name: [name] [name] Date of Service: 09/05/2015 1:30 PM Medical Record Number: [id number] Patient Account Number: [id number] Date of Birth/Sex: Feb 05, 1986 (29 y.o. Male) Treating RN: Phillis Haggis Primary Care Physician: Bethann Punches Other Clinician: Referring Physician: Bethann Punches Treating Physician/Extender: Altamese [place] in Treatment: 4 Encounter Discharge Information Items Discharge Pain Level: 0 Discharge Condition: Stable Ambulatory Status:  Wheelchair Discharge Destination: Home Transportation: Private Auto Accompanied By: caregiver Schedule Follow-up Appointment: Yes Medication Reconciliation completed Yes and provided to Patient/Care Jera Headings: Provided on Clinical Summary of Care: 09/05/2015 Form Type Recipient Paper Patient ZI Electronic Signature(s) Signed: 09/05/2015 2:41:39 PM By: Gwenlyn Perking Entered By: Gwenlyn Perking on 09/05/2015 14:41:39 [name] [name] ([id number]) -------------------------------------------------------------------------------- Lower Extremity Assessment Details Patient Name: [name] [name] Date of Service: 09/05/2015 1:30 PM Medical Record Number: [id number] Patient Account Number: [id number] Date of Birth/Sex: 1986-01-26 (29 y.o. Male) Treating RN: Phillis Haggis Primary Care Physician: Bethann Punches Other Clinician: Referring Physician: Bethann Punches Treating Physician/Extender: Maxwell Caul Weeks in Treatment: 4 Vascular Assessment Pulses: Posterior Tibial Dorsalis Pedis Palpable: [Left:Yes] Extremity colors, hair growth, and conditions: Extremity Color: [Left:Normal] Temperature of Extremity: [Left:Warm] Capillary Refill: [Left:< 3 seconds] Toe Nail Assessment Left: Right: Thick: No Discolored: No Deformed: No Improper Length and Hygiene: No Electronic Signature(s) Signed: 09/07/2015 5:51:12 PM By: Alejandro Mulling Entered By: Alejandro Mulling on 09/05/2015 13:57:37 [name] [name] ([id number]) -------------------------------------------------------------------------------- Multi Wound Chart Details Patient Name: [name] [name] Date of Service: 09/05/2015 1:30 PM Medical Record Number: [id number] Patient Account Number: [id number] Date of Birth/Sex: 27-Mar-1986 (29 y.o. Male) Treating RN: Phillis Haggis Primary Care Physician: Bethann Punches Other Clinician: Referring Physician: Bethann Punches Treating Physician/Extender: Maxwell Caul Weeks in Treatment: 4 Vital  Signs Height(in): 72 Pulse(bpm): 69 Weight(lbs): 200 Blood Pressure 103/71 (mmHg): Body Mass Index(BMI): 27 Temperature(F): 98.0 Respiratory Rate 18 (breaths/min): Photos: [1:No Photos] [N/A:N/A] Wound Location: [1:Left Calcaneus] [N/A:N/A] Wounding Event: [1:Gradually Appeared] [N/A:N/A] Primary Etiology: [1:Infection - not elsewhere N/A classified] Comorbid History: [1:Hypertension, Hepatitis C, N/A History of pressure wounds, Paraplegia] Date Acquired: [1:06/11/2015] [N/A:N/A] Weeks of Treatment: [1:4] [N/A:N/A] Wound Status: [1:Open] [N/A:N/A] Measurements L x W x D 1.6x0.8x0.1 [N/A:N/A] (cm) Area (cm) : [1:1.005] [N/A:N/A] Volume (cm) : [1:0.101] [N/A:N/A] % Reduction in Area: [1:24.70%] [N/A:N/A] %  Reduction in Volume: 24.60% [N/A:N/A] Classification: [1:Full Thickness Without Exposed Support Structures] [N/A:N/A] Exudate Amount: [1:Medium] [N/A:N/A] Exudate Type: [1:Serosanguineous] [N/A:N/A] Exudate Color: [1:red, brown] [N/A:N/A] Wound Margin: [1:Distinct, outline attached N/A] Granulation Amount: [1:Medium (34-66%)] [N/A:N/A] Granulation Quality: [1:Pink, Pale] [N/A:N/A] Necrotic Amount: [1:Medium (34-66%)] [N/A:N/A] Exposed Structures: [1:Fascia: No Fat: No Tendon: No] [N/A:N/A] Muscle: No Joint: No Bone: No Limited to Skin Breakdown Epithelialization: None N/A N/A Periwound Skin Texture: Edema: Yes N/A N/A Excoriation: No Induration: No Callus: No Crepitus: No Fluctuance: No Friable: No Rash: No Scarring: No Periwound Skin Moist: Yes N/A N/A Moisture: Maceration: No Dry/Scaly: No Periwound Skin Color: Atrophie Blanche: No N/A N/A Cyanosis: No Ecchymosis: No Erythema: No Hemosiderin Staining: No Mottled: No Pallor: No Rubor: No Temperature: No Abnormality N/A N/A Tenderness on Yes N/A N/A Palpation: Wound Preparation: Ulcer Cleansing: N/A N/A Rinsed/Irrigated with Saline Topical Anesthetic Applied: Other: lidocaine 4% Treatment  Notes Electronic Signature(s) Signed: 09/07/2015 5:51:12 PM By: Alejandro Mulling Entered By: Alejandro Mulling on 09/05/2015 14:02:28 [name] [name] ([id number]) -------------------------------------------------------------------------------- Multi-Disciplinary Care Plan Details Patient Name: [name] [name] Date of Service: 09/05/2015 1:30 PM Medical Record Number: [id number] Patient Account Number: [id number] Date of Birth/Sex: 31-Oct-1985 (29 y.o. Male) Treating RN: Phillis Haggis Primary Care Physician: Bethann Punches Other Clinician: Referring Physician: Bethann Punches Treating Physician/Extender: Maxwell Caul Weeks in Treatment: 4 Active Inactive Electronic Signature(s) Signed: 09/07/2015 5:51:12 PM By: Alejandro Mulling Entered By: Alejandro Mulling on 09/05/2015 14:02:18 [name] [name] ([id number]) -------------------------------------------------------------------------------- Pain Assessment Details Patient Name: [name] [name] Date of Service: 09/05/2015 1:30 PM Medical Record Number: [id number] Patient Account Number: [id number] Date of Birth/Sex: September 19, 1985 (29 y.o. Male) Treating RN: Phillis Haggis Primary Care Physician: Bethann Punches Other Clinician: Referring Physician: Bethann Punches Treating Physician/Extender: Maxwell Caul Weeks in Treatment: 4 Active Problems Location of Pain Severity and Description of Pain Patient Has Paino No Site Locations Pain Management and Medication Current Pain Management: Electronic Signature(s) Signed: 09/07/2015 5:51:12 PM By: Alejandro Mulling Entered By: Alejandro Mulling on 09/05/2015 13:52:57 [name] [name] ([id number]) -------------------------------------------------------------------------------- Patient/Caregiver Education Details Patient Name: [name] [name] Date of Service: 09/05/2015 1:30 PM Medical Record Number: [id number] Patient Account Number: [id number] Date of Birth/Gender: August 17, 1985 (29 y.o. Male) Treating RN: Phillis Haggis Primary Care Physician: Bethann Punches Other Clinician: Referring Physician: Bethann Punches Treating Physician/Extender: Altamese [place] in Treatment: 4 Education Assessment Education Provided To: Caregiver Education Topics Provided Wound/Skin Impairment: Handouts: Other: chnage dressing as ordered Methods: Demonstration, Explain/Verbal Responses: State content correctly Electronic Signature(s) Signed: 09/07/2015 5:51:12 PM By: Alejandro Mulling Entered By: Alejandro Mulling on 09/05/2015 14:20:53 [name] [name] ([id number]) -------------------------------------------------------------------------------- Wound Assessment Details Patient Name: [name] [name] Date of Service: 09/05/2015 1:30 PM Medical Record Number: [id number] Patient Account Number: [id number] Date of Birth/Sex: 1986/05/07 (29 y.o. Male) Treating RN: Phillis Haggis Primary Care Physician: Bethann Punches Other Clinician: Referring Physician: Bethann Punches Treating Physician/Extender: Altamese  in Treatment: 4 Wound Status Wound Number: 1 Primary Infection - not elsewhere classified Etiology: Wound Location: Left Calcaneus Wound Open Wounding Event: Gradually Appeared Status: Date Acquired: 06/11/2015 Comorbid Hypertension, Hepatitis C, History of Weeks Of Treatment: 4 History: pressure wounds, Paraplegia Clustered Wound: No Photos Photo Uploaded By: Alejandro Mulling on 09/05/2015 15:43:21 Wound Measurements Length: (cm) 1.6 Width: (cm) 0.8 Depth: (cm) 0.1 Area: (cm) 1.005 Volume: (cm) 0.101 % Reduction in Area: 24.7% % Reduction in Volume: 24.6% Epithelialization: None Tunneling: No Undermining: No Wound Description Full Thickness Without Exposed Classification: Support Structures Wound Margin: Distinct, outline attached Exudate Large Amount: Exudate Type: Serosanguineous  Exudate Color: red, brown Foul Odor After Cleansing: No Wound Bed Granulation Amount: Medium  (34-66%) Exposed Structure Granulation Quality: Pink, Pale Fascia Exposed: No Necrotic Amount: Medium (34-66%) Fat Layer Exposed: No [name] [name], [name] [name] ([id number]) Necrotic Quality: Adherent Slough Tendon Exposed: No Muscle Exposed: No Joint Exposed: No Bone Exposed: No Limited to Skin Breakdown Periwound Skin Texture Texture Color No Abnormalities Noted: No No Abnormalities Noted: No Callus: No Atrophie Blanche: No Crepitus: No Cyanosis: No Excoriation: No Ecchymosis: No Fluctuance: No Erythema: No Friable: No Hemosiderin Staining: No Induration: No Mottled: No Localized Edema: Yes Pallor: No Rash: No Rubor: No Scarring: No Temperature / Pain Moisture Temperature: No Abnormality No Abnormalities Noted: No Tenderness on Palpation: Yes Dry / Scaly: No Maceration: No Moist: Yes Wound Preparation Ulcer Cleansing: Rinsed/Irrigated with Saline Topical Anesthetic Applied: Other: lidocaine 4%, Treatment Notes Wound #1 (Left Calcaneus) 1. Cleansed with: Clean wound with Normal Saline 2. Anesthetic Topical Lidocaine 4% cream to wound bed prior to debridement 3. Peri-wound Care: Skin Prep 4. Dressing Applied: Prisma Ag 5. Secondary Dressing Applied Bordered Foam Dressing Dry Gauze Notes sage boot Electronic Signature(s) Signed: 09/07/2015 5:51:12 PM By: Angie Fava, [name] [name] ([id number]) Entered By: Alejandro Mulling on 09/05/2015 14:22:10 [name] [name] ([id number]) -------------------------------------------------------------------------------- Vitals Details Patient Name: [name] [name] Date of Service: 09/05/2015 1:30 PM Medical Record Number: [id number] Patient Account Number: [id number] Date of Birth/Sex: [birth year] (29 y.o. Male) Treating RN: Phillis Haggis Primary Care Physician: Bethann Punches Other Clinician: Referring Physician: Bethann Punches Treating Physician/Extender: Altamese [place] in Treatment: 4 Vital Signs Time Taken:  13:53 Temperature (F): 98.0 Height (in): 72 Pulse (bpm): 69 Weight (lbs): 200 Respiratory Rate (breaths/min): 18 Body Mass Index (BMI): 27.1 Blood Pressure (mmHg): 103/71 Reference Range: 80 - 120 mg / dl Electronic Signature(s) Signed: 09/07/2015 5:51:12 PM By: Alejandro Mulling Entered By: Alejandro Mulling on 09/05/2015 13:55:53

## 2015-09-11 ENCOUNTER — Encounter: Payer: 59 | Admitting: Occupational Therapy

## 2015-09-11 ENCOUNTER — Encounter: Payer: 59 | Admitting: Speech Pathology

## 2015-09-12 ENCOUNTER — Encounter: Payer: 59 | Admitting: Internal Medicine

## 2015-09-12 ENCOUNTER — Ambulatory Visit: Payer: 59 | Admitting: Physical Therapy

## 2015-09-12 ENCOUNTER — Encounter: Payer: Self-pay | Admitting: Physical Therapy

## 2015-09-12 ENCOUNTER — Encounter: Payer: 59 | Admitting: Speech Pathology

## 2015-09-12 ENCOUNTER — Ambulatory Visit: Payer: Commercial Managed Care - HMO | Admitting: Physical Therapy

## 2015-09-12 ENCOUNTER — Encounter: Payer: 59 | Admitting: Occupational Therapy

## 2015-09-12 DIAGNOSIS — M6281 Muscle weakness (generalized): Secondary | ICD-10-CM

## 2015-09-12 DIAGNOSIS — L89622 Pressure ulcer of left heel, stage 2: Secondary | ICD-10-CM | POA: Diagnosis not present

## 2015-09-12 DIAGNOSIS — Z9181 History of falling: Secondary | ICD-10-CM

## 2015-09-12 DIAGNOSIS — R471 Dysarthria and anarthria: Secondary | ICD-10-CM | POA: Diagnosis not present

## 2015-09-12 DIAGNOSIS — Z7409 Other reduced mobility: Secondary | ICD-10-CM

## 2015-09-12 NOTE — Therapy (Signed)
[place] [facility] MAIN [facility] SERVICES [address] [place], [place], [address] Phone: [phone]   Fax:  [phone]  Physical Therapy Treatment  Patient Details  Name: [name] [name] MRN: [id number] Date of Birth: 07/08/1986 Referring Provider: [name] MD  Encounter Date: 09/12/2015      PT End of Session - 09/12/15 1341    Visit Number 4   Number of Visits 25   Date for PT Re-Evaluation 11/16/15   PT Start Time 1015   PT Stop Time 1100   PT Time Calculation (min) 45 min   Activity Tolerance Treatment limited secondary to agitation   Behavior During Therapy Agitated;Anxious      Past Medical History  Diagnosis Date  . Hepatitis C   . Scoliosis   . TBI (traumatic brain injury) (HCC) 07/2011  . Subarachnoid hemorrhage (HCC)   . Pulmonary emboli (HCC)   . Hepatitis C   . Depression     Past Surgical History  Procedure Laterality Date  . Fracture surgery      left acetabulum  . Ileostomy    . Lung tube    . Ankle surgery Bilateral   . Tracheostomy    . Peg tube placement    . [place] gastrostomy  07/02/2014  . Ivc filter placement ([facility] hx)    . Brain surgery      There were no vitals filed for this visit.  Visit Diagnosis:  Muscular weakness  Risk for falls  Impaired mobility      Subjective Assessment - 09/12/15 1340    Subjective Patient reports doing okay today; He reports "The devil has kept my legs from moving. they won't move an inch"   Patient is accompained by: Family member   Pertinent History personal factors affecting rehab: chronic condition, high fall risk; LLE hip heterotrophic osscification; impaired cognition, ranchos scale #4 with inappropriate behavior at times;    How long can you sit comfortably? na   How long can you stand comfortably? able to stand for a few seconds with max A; stands in stander for 45 min at a time 2x a day;   How long can you walk comfortably? unable to ambulate at this time   Patient Stated Goals having leg pain, hoping to work on HEP and mobility; Improve mobility prior to stem cell surgery;    Currently in Pain? No/denies  at rest; increased pain with movement;      TREATMENT:  Transferred patient from power chair to nustep using hoyer lift, max A +2 for leg positioning and posture; patient requires mod VCs for hand placement; Patient safely buckled into Nustep with feet positioning on pedals, seat #10-11; Patient used RUE for propelling Nustep; Level 1; Required max verbal and tactile cues to initiate RLE and RUE movement on Nustep; He was sitting on Nustep with cues for posture and UE/LE movement for approximately 25 min however only completed 3 min of active Nustep use. Patient pleased with ability to initiate RLE knee and hip flexion with nustep activation;  Patient continues to fixate on lack of mobility and requires increased cues to redirect to task for increased mobility.                            PT Education - 09/12/15 1340    Education provided Yes   Education Details Nustep, sitting posture   Person(s) Educated Patient   Methods Explanation;Verbal cues  Comprehension Verbalized understanding;Returned demonstration;Verbal cues required             PT Long Term Goals - 08/24/15 0909    PT LONG TERM GOAL #1   Title Caregivers will be independent in performing HEP with patient including PROM and AAROM exercise for increased joint flexibility by 11/16/15   Baseline Caregivers are doing some PROM but would benefit from education to improve all joint mobility;   Time 12   Period Weeks   Status New   PT LONG TERM GOAL #2   Title Patient will be able to sit with 1 UE supported only with back unsupported keeping erect posture with supervision for ADLs such as dressing and bathing by 11/16/15   Baseline unable to sit unsupported without assistance;   Time 12   Period Weeks   Status New   PT LONG TERM GOAL #3   Title  Patient will be mod A for initiating sit<>stand transfer with LRAD to improve functional mobility in the home by 11/16/15   Baseline requires max A to initiate standing but unable to stand without 2 person assist;   Time 12   Period Weeks   Status New   PT LONG TERM GOAL #4   Title Patient will be able to stand with 1 UE supported with CGA for at least 15 sec or more to improve standing tolerance for ADLs by 11/16/15   Baseline Patient unable to stand without heavy assistance; He is standing in stander for 45 min 2x a day but unable to stand with caregivers;    Time 12   Period Weeks   Status New               Plan - 09/12/15 1342    Clinical Impression Statement Instructed patient in BLE movement and BUE movement on Nustep; Patient is max A +2 for hoyer lift transfer with assistance for positioning legs; Patient instructed in using RUE and RLE for movement on Nustep; He did require mod A for initiating RLE knee flexion. He became anxious and agitated with knee movement and required increased cues for motivation on machine. Patient would benefit from additional skilled PT Intervention to improve LE strength, balance and functional mobility;    Pt will benefit from skilled therapeutic intervention in order to improve on the following deficits Abnormal gait;Decreased endurance;Decreased skin integrity;Hypomobility;Impaired tone;Decreased activity tolerance;Decreased knowledge of use of DME;Decreased strength;Increased muscle spasms;Decreased mobility;Decreased balance;Decreased cognition;Impaired perceived functional ability;Decreased safety awareness;Decreased coordination   Rehab Potential Fair   Clinical Impairments Affecting Rehab Potential positive: good caregiver support; negative: chronic condition; Patient's current clinical presentation is unstable as he has impaired congnition, behavioral changes and mobility deficits that change regularly due to TBI presentation;    PT Frequency 2x /  week   PT Duration 12 weeks   PT Treatment/Interventions ADLs/Self Care Home Management;Cryotherapy;Electrical Stimulation;Moist Heat;Balance training;Therapeutic exercise;Therapeutic activities;Functional mobility training;Gait training;DME Instruction;Neuromuscular re-education;Patient/family education;Taping;Energy conservation;Passive range of motion   PT Next Visit Plan work on bed mobility; advance sitting exercise, Nustep   PT Home Exercise Plan continue as previously given   Consulted and Agree with Plan of Care Patient;Family member/caregiver   Family Member Consulted mom        Problem List Patient Active Problem List   Diagnosis Date Noted  . Colitis, Clostridium difficile 06/19/2015  . Hypoxia 05/25/2015  . Clostridium difficile infection 05/25/2015  . Mucus plugging of bronchi 05/25/2015  . Low grade fever 05/25/2015  . History of pulmonary embolism  05/25/2015  . OSA on CPAP 05/25/2015  . Pressure ulcer 05/25/2015  . Convulsions/seizures (HCC) 02/10/2015  . Aspiration pneumonia (HCC) 02/07/2015  . Encephalopathy, metabolic 02/07/2015  . Acute respiratory failure (HCC) 01/29/2015  . Skin irritation 11/09/2014  . TBI (traumatic brain injury) (HCC) 02/04/2012  . 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 Heterotopic ossification 02/04/2012    Trotter,Margaret PT, DPT 09/12/2015, 2:13 PM  [place] [facility] [place] MAIN [facility] SERVICES [address] [place], [place], [address] Phone: [phone]   Fax:  [phone]  Name: [name] [name] MRN: [id number] Date of Birth: [birth year]

## 2015-09-13 ENCOUNTER — Encounter: Payer: 59 | Admitting: Occupational Therapy

## 2015-09-14 ENCOUNTER — Ambulatory Visit: Payer: 59 | Admitting: Physical Therapy

## 2015-09-14 NOTE — Progress Notes (Signed)
[name] [name] ([id number]) Visit Report for 09/12/2015 Arrival Information Details Patient Name: [name] [name], [name] [name] Date of Service: 09/12/2015 12:45 PM Medical Record Number: [id number] Patient Account Number: [id number] Date of Birth/Sex: Dec 02, 1985 (29 y.o. Male) Treating RN: Clover Mealy, RN, BSN, [place] Sink Primary Care Physician: Bethann Punches Other Clinician: Referring Physician: Bethann Punches Treating Physician/Extender: Altamese [place] in Treatment: 5 Visit Information History Since Last Visit Added or deleted any medications: No Patient Arrived: Wheel Chair Any new allergies or adverse reactions: No Arrival Time: 12:57 Had a fall or experienced change in No activities of daily living that may affect Accompanied By: caregiver risk of falls: Transfer Assistance: None Signs or symptoms of abuse/neglect since last No Patient Identification Verified: Yes visito Secondary Verification Process Yes Hospitalized since last visit: No Completed: Has Dressing in Place as Prescribed: Yes Patient Requires Transmission-Based No Pain Present Now: No Precautions: Patient Has Alerts: No Electronic Signature(s) Signed: 09/12/2015 4:45:24 PM By: Elpidio Eric BSN, RN Entered By: Elpidio Eric on 09/12/2015 12:57:41 [name] [name] ([id number]) -------------------------------------------------------------------------------- Clinic Level of Care Assessment Details Patient Name: [name] [name] Date of Service: 09/12/2015 12:45 PM Medical Record Number: [id number] Patient Account Number: [id number] Date of Birth/Sex: Sep 03, 1985 (29 y.o. Male) Treating RN: Clover Mealy, RN, BSN, [place] Primary Care Physician: Bethann Punches Other Clinician: Referring Physician: Bethann Punches Treating Physician/Extender: Altamese [place] in Treatment: 5 Clinic Level of Care Assessment Items TOOL 4 Quantity Score []  - Use when only an EandM is performed on FOLLOW-UP visit 0 ASSESSMENTS - Nursing Assessment / Reassessment X -  Reassessment of Co-morbidities (includes updates in patient status) 1 10 X - Reassessment of Adherence to Treatment Plan 1 5 ASSESSMENTS - Wound and Skin Assessment / Reassessment X - Simple Wound Assessment / Reassessment - one wound 1 5 []  - Complex Wound Assessment / Reassessment - multiple wounds 0 []  - Dermatologic / Skin Assessment (not related to wound area) 0 ASSESSMENTS - Focused Assessment []  - Circumferential Edema Measurements - multi extremities 0 []  - Nutritional Assessment / Counseling / Intervention 0 X - Lower Extremity Assessment (monofilament, tuning fork, pulses) 1 5 []  - Peripheral Arterial Disease Assessment (using hand held doppler) 0 ASSESSMENTS - Ostomy and/or Continence Assessment and Care []  - Incontinence Assessment and Management 0 []  - Ostomy Care Assessment and Management (repouching, etc.) 0 PROCESS - Coordination of Care X - Simple Patient / Family Education for ongoing care 1 15 []  - Complex (extensive) Patient / Family Education for ongoing care 0 []  - Staff obtains Chiropractor, Records, Test Results / Process Orders 0 []  - Staff telephones HHA, Nursing Homes / Clarify orders / etc 0 []  - Routine Transfer to another Facility (non-emergent condition) 0 [name] [name] ([id number]) []  - Routine Hospital Admission (non-emergent condition) 0 []  - New Admissions / Manufacturing engineer / Ordering NPWT, Apligraf, etc. 0 []  - Emergency Hospital Admission (emergent condition) 0 []  - Simple Discharge Coordination 0 []  - Complex (extensive) Discharge Coordination 0 PROCESS - Special Needs []  - Pediatric / Minor Patient Management 0 []  - Isolation Patient Management 0 []  - Hearing / Language / Visual special needs 0 []  - Assessment of Community assistance (transportation, D/C planning, etc.) 0 []  - Additional assistance / Altered mentation 0 []  - Support Surface(s) Assessment (bed, cushion, seat, etc.) 0 INTERVENTIONS - Wound Cleansing / Measurement X - Simple  Wound Cleansing - one wound 1 5 []  - Complex Wound Cleansing - multiple wounds 0 X - Wound Imaging (photographs - any number of wounds) 1  5  - Wound Tracing (instead of photographs) 0  - Simple Wound Measurement - one wound 0  - Complex Wound Measurement - multiple wounds 0 INTERVENTIONS - Wound Dressings X - Small Wound Dressing one or multiple wounds 1 10  - Medium Wound Dressing one or multiple wounds 0  - Large Wound Dressing one or multiple wounds 0  - Application of Medications - topical 0  - Application of Medications - injection 0 INTERVENTIONS - Miscellaneous  - External ear exam 0 [name] [name] ([id number])  - Specimen Collection (cultures, biopsies, blood, body fluids, etc.) 0  - Specimen(s) / Culture(s) sent or taken to Lab for analysis 0  - Patient Transfer (multiple staff / Michiel Sites Lift / Similar devices) 0  - Simple Staple / Suture removal (25 or less) 0  - Complex Staple / Suture removal (26 or more) 0  - Hypo / Hyperglycemic Management (close monitor of Blood Glucose) 0  - Ankle / Brachial Index (ABI) - do not check if billed separately 0 X - Vital Signs 1 5 Has the patient been seen at the hospital within the last three years: Yes Total Score: 65 Level Of Care: New/Established - Level 2 Electronic Signature(s) Signed: 09/12/2015 4:45:24 PM By: Elpidio Eric BSN, RN Entered By: Elpidio Eric on 09/12/2015 13:22:55 [name] [name] ([id number]) -------------------------------------------------------------------------------- Encounter Discharge Information Details Patient Name: [name] [name] Date of Service: 09/12/2015 12:45 PM Medical Record Number: [id number] Patient Account Number: [id number] Date of Birth/Sex: 1986/07/03 (29 y.o. Male) Treating RN: Clover Mealy, RN, BSN, [place] Sink Primary Care Physician: Bethann Punches Other Clinician: Referring Physician: Bethann Punches Treating Physician/Extender: Altamese [place] in Treatment: 5 Encounter Discharge  Information Items Schedule Follow-up Appointment: No Medication Reconciliation completed No and provided to Patient/Care Burnadette Baskett: Provided on Clinical Summary of Care: 09/12/2015 Form Type Recipient Paper Patient ZI Electronic Signature(s) Signed: 09/12/2015 1:23:43 PM By: Gwenlyn Perking Entered By: Gwenlyn Perking on 09/12/2015 13:23:43 [name] [name] ([id number]) -------------------------------------------------------------------------------- Lower Extremity Assessment Details Patient Name: [name] [name] Date of Service: 09/12/2015 12:45 PM Medical Record Number: [id number] Patient Account Number: [id number] Date of Birth/Sex: 01/05/1986 (29 y.o. Male) Treating RN: Clover Mealy, RN, BSN, [place] Sink Primary Care Physician: Bethann Punches Other Clinician: Referring Physician: Bethann Punches Treating Physician/Extender: Maxwell Caul [place] in Treatment: 5 Vascular Assessment Pulses: Posterior Tibial Dorsalis Pedis Palpable: [Left:Yes] Extremity colors, hair growth, and conditions: Hair Growth on Extremity: [Left:Yes] Temperature of Extremity: [Left:Warm] Capillary Refill: [Left:< 3 seconds] Toe Nail Assessment Left: Right: Thick: No Discolored: No Deformed: No Improper Length and Hygiene: No Electronic Signature(s) Signed: 09/12/2015 4:45:24 PM By: Elpidio Eric BSN, RN Entered By: Elpidio Eric on 09/12/2015 13:02:24 [name] [name] ([id number]) -------------------------------------------------------------------------------- Multi Wound Chart Details Patient Name: [name] [name] Date of Service: 09/12/2015 12:45 PM Medical Record Number: [id number] Patient Account Number: [id number] Date of Birth/Sex: 1986-06-10 (29 y.o. Male) Treating RN: Clover Mealy, RN, BSN, [place] Primary Care Physician: Bethann Punches Other Clinician: Referring Physician: Bethann Punches Treating Physician/Extender: Maxwell Caul Weeks in Treatment: 5 Vital Signs Height(in): 72 Pulse(bpm): 72 Weight(lbs): 200 Blood  Pressure 120/69 (mmHg): Body Mass Index(BMI): 27 Temperature(F): 97.7 Respiratory Rate 18 (breaths/min): Photos: [1:No Photos] [N/A:N/A] Wound Location: [1:Left Calcaneus] [N/A:N/A] Wounding Event: [1:Gradually Appeared] [N/A:N/A] Primary Etiology: [1:Infection - not elsewhere N/A classified] Comorbid History: [1:Hypertension, Hepatitis C, N/A History of pressure wounds, Paraplegia] Date Acquired: [1:06/11/2015] [N/A:N/A] Weeks of Treatment: [1:5] [N/A:N/A] Wound Status: [1:Open] [N/A:N/A] Measurements L x W x D 1x1x0.3 [N/A:N/A] (cm) Area (cm) : [1:0.785] [N/A:N/A] Volume (cm) : [1:0.236] [N/A:N/A] %  Reduction in Area: [1:41.20%] [N/A:N/A] % Reduction in Volume: -76.10% [N/A:N/A] Classification: [1:Full Thickness Without Exposed Support Structures] [N/A:N/A] Exudate Amount: [1:Large] [N/A:N/A] Exudate Type: [1:Serosanguineous] [N/A:N/A] Exudate Color: [1:red, brown] [N/A:N/A] Wound Margin: [1:Distinct, outline attached N/A] Granulation Amount: [1:Medium (34-66%)] [N/A:N/A] Granulation Quality: [1:Pink, Pale] [N/A:N/A] Necrotic Amount: [1:Medium (34-66%)] [N/A:N/A] Exposed Structures: [1:Fascia: No Fat: No Tendon: No] [N/A:N/A] Muscle: No Joint: No Bone: No Limited to Skin Breakdown Epithelialization: None N/A N/A Periwound Skin Texture: Edema: Yes N/A N/A Excoriation: No Induration: No Callus: No Crepitus: No Fluctuance: No Friable: No Rash: No Scarring: No Periwound Skin Moist: Yes N/A N/A Moisture: Maceration: No Dry/Scaly: No Periwound Skin Color: Atrophie Blanche: No N/A N/A Cyanosis: No Ecchymosis: No Erythema: No Hemosiderin Staining: No Mottled: No Pallor: No Rubor: No Temperature: No Abnormality N/A N/A Tenderness on Yes N/A N/A Palpation: Wound Preparation: Ulcer Cleansing: N/A N/A Rinsed/Irrigated with Saline Topical Anesthetic Applied: Other: lidocaine 4% Treatment Notes Electronic Signature(s) Signed: 09/12/2015 4:45:24 PM By:  Elpidio Eric BSN, RN Entered By: Elpidio Eric on 09/12/2015 13:03:42 [name] [name] ([id number]) -------------------------------------------------------------------------------- Multi-Disciplinary Care Plan Details Patient Name: [name] [name] Date of Service: 09/12/2015 12:45 PM Medical Record Number: [id number] Patient Account Number: [id number] Date of Birth/Sex: 05/03/1986 (29 y.o. Male) Treating RN: Clover Mealy, RN, BSN, [place] Sink Primary Care Physician: Bethann Punches Other Clinician: Referring Physician: Bethann Punches Treating Physician/Extender: Altamese [place] in Treatment: 5 Active Inactive Electronic Signature(s) Signed: 09/12/2015 4:45:24 PM By: Elpidio Eric BSN, RN Entered By: Elpidio Eric on 09/12/2015 13:03:35 [name] [name] ([id number]) -------------------------------------------------------------------------------- Pain Assessment Details Patient Name: [name] [name] Date of Service: 09/12/2015 12:45 PM Medical Record Number: [id number] Patient Account Number: [id number] Date of Birth/Sex: 1986/04/17 (29 y.o. Male) Treating RN: Clover Mealy, RN, BSN, [place] Primary Care Physician: Bethann Punches Other Clinician: Referring Physician: Bethann Punches Treating Physician/Extender: Altamese [place] in Treatment: 5 Active Problems Location of Pain Severity and Description of Pain Patient Has Paino No Site Locations Pain Management and Medication Current Pain Management: Electronic Signature(s) Signed: 09/12/2015 4:45:24 PM By: Elpidio Eric BSN, RN Entered By: Elpidio Eric on 09/12/2015 12:57:49 [name] [name] ([id number]) -------------------------------------------------------------------------------- Wound Assessment Details Patient Name: [name] [name] Date of Service: 09/12/2015 12:45 PM Medical Record Number: [id number] Patient Account Number: [id number] Date of Birth/Sex: 03-05-1986 (29 y.o. Male) Treating RN: Clover Mealy, RN, BSN, [place] Primary Care Physician: Bethann Punches Other  Clinician: Referring Physician: Bethann Punches Treating Physician/Extender: Altamese [place] in Treatment: 5 Wound Status Wound Number: 1 Primary Infection - not elsewhere classified Etiology: Wound Location: Left Calcaneus Wound Open Wounding Event: Gradually Appeared Status: Date Acquired: 06/11/2015 Comorbid Hypertension, Hepatitis C, History of Weeks Of Treatment: 5 History: pressure wounds, Paraplegia Clustered Wound: No Photos Photo Uploaded By: Elpidio Eric on 09/12/2015 16:38:45 Wound Measurements Length: (cm) 1 Width: (cm) 1 Depth: (cm) 0.3 Area: (cm) 0.785 Volume: (cm) 0.236 % Reduction in Area: 41.2% % Reduction in Volume: -76.1% Epithelialization: None Tunneling: No Undermining: No Wound Description Full Thickness Without Exposed Classification: Support Structures Wound Margin: Distinct, outline attached Exudate Large Amount: Exudate Type: Serosanguineous Exudate Color: red, brown Foul Odor After Cleansing: No Wound Bed Granulation Amount: Medium (34-66%) Exposed Structure Granulation Quality: Pink, Pale Fascia Exposed: No Necrotic Amount: Medium (34-66%) Fat Layer Exposed: No [place], Earna Coder ([id number]) Necrotic Quality: Adherent Slough Tendon Exposed: No Muscle Exposed: No Joint Exposed: No Bone Exposed: No Limited to Skin Breakdown Periwound Skin Texture Texture Color No Abnormalities Noted: No No Abnormalities Noted: No Callus: No Atrophie Blanche: No Crepitus: No Cyanosis: No Excoriation:  No Ecchymosis: No Fluctuance: No Erythema: No Friable: No Hemosiderin Staining: No Induration: No Mottled: No Localized Edema: Yes Pallor: No Rash: No Rubor: No Scarring: No Temperature / Pain Moisture Temperature: No Abnormality No Abnormalities Noted: No Tenderness on Palpation: Yes Dry / Scaly: No Maceration: No Moist: Yes Wound Preparation Ulcer Cleansing: Rinsed/Irrigated with Saline Topical Anesthetic Applied: Other:  lidocaine 4%, Electronic Signature(s) Signed: 09/12/2015 4:45:24 PM By: Elpidio Eric BSN, RN Entered By: Elpidio Eric on 09/12/2015 13:02:46 [name] [name] ([id number]) -------------------------------------------------------------------------------- Vitals Details Patient Name: [name] [name] Date of Service: 09/12/2015 12:45 PM Medical Record Number: [id number] Patient Account Number: [id number] Date of Birth/Sex: [birth year] (29 y.o. Male) Treating RN: Clover Mealy, RN, BSN, [place] Primary Care Physician: Bethann Punches Other Clinician: Referring Physician: Bethann Punches Treating Physician/Extender: Altamese [place] in Treatment: 5 Vital Signs Time Taken: 12:57 Temperature (F): 97.7 Height (in): 72 Pulse (bpm): 72 Weight (lbs): 200 Respiratory Rate (breaths/min): 18 Body Mass Index (BMI): 27.1 Blood Pressure (mmHg): 120/69 Reference Range: 80 - 120 mg / dl Electronic Signature(s) Signed: 09/12/2015 4:45:24 PM By: Elpidio Eric BSN, RN Entered By: Elpidio Eric on 09/12/2015 12:58:08

## 2015-09-14 NOTE — Progress Notes (Signed)
[name], [name] ([id number]) Visit Report for 09/12/2015 Chief Complaint Document Details Patient Name: [name] [name] Date of Service: 09/12/2015 12:45 PM Medical Record Number: [id number] Patient Account Number: [id number] Date of Birth/Sex: 1985/10/22 (29 y.o. Male) Treating RN: Clover Mealy, RN, BSN, [place] Sink Primary Care Physician: Bethann Punches Other Clinician: Referring Physician: Bethann Punches Treating Physician/Extender: Altamese [place] in Treatment: 5 Information Obtained from: Caregiver Chief Complaint Patient is at the clinic for treatment of an open pressure ulcer to his left heel which she's had for about 4-5 months Electronic Signature(s) Signed: 09/13/2015 5:04:49 PM By: Baltazar Najjar MD Entered By: Baltazar Najjar on 09/12/2015 14:24:52 [name] [name] ([id number]) -------------------------------------------------------------------------------- HPI Details Patient Name: [name] [name] Date of Service: 09/12/2015 12:45 PM Medical Record Number: [id number] Patient Account Number: [id number] Date of Birth/Sex: May 09, 1986 (29 y.o. Male) Treating RN: Clover Mealy, RN, BSN, [place] Sink Primary Care Physician: Bethann Punches Other Clinician: Referring Physician: Bethann Punches Treating Physician/Extender: Maxwell Caul Weeks in Treatment: 5 History of Present Illness Location: left heel Quality: Patient reports No Pain. Severity: Patient states wound are getting worse. Duration: Patient has had the wound for > 3 months prior to seeking treatment at the wound center Context: The wound appeared gradually over time Modifying Factors: Other treatment(s) tried include:local care with some silver and zinc paste HPI Description: 30 year old male who has a history of traumatic brain injury and also has a ulcer on his left heel which was noted by the family about 4-5 months ago. The TBI was in January 2013 and prior to this visit the patient has had hyperbaric oxygen therapy approximately 2 years after his  TBI. He is had several gastrostomy tube placed by the gastroenterologist but then finally had a surgical procedure to bring the gastric mucosa to the skin surface. This was done approximately 9 months ago. Past medical history of hepatitis C, traumatic brain injury, pulmonary emboli, ileostomy, tracheostomy, PEG tube placement. Was a former smoker and quit in January 2013 09/05/15; this is a 30 year old man who has a history of traumatic brain injury. He has fed through a PEG tube. He has a pressure ulcer on the left heel which he had for many months. Currently they've been using Santyl to the heel wound which had a surface eschar. There are returning today in follow-up 09/12/15; I saw this patient last week for the first time. He has a pressure ulcer on the left heel which she had had for many months. He was using Santyl and didn't have any product. We divided the wound and changed him to Silver collagen which apparently arrived at his house the next day Electronic Signature(s) Signed: 09/13/2015 5:04:49 PM By: Baltazar Najjar MD Entered By: Baltazar Najjar on 09/12/2015 14:25:53 [name] [name] ([id number]) -------------------------------------------------------------------------------- Physical Exam Details Patient Name: [name] [name] Date of Service: 09/12/2015 12:45 PM Medical Record Number: [id number] Patient Account Number: [id number] Date of Birth/Sex: 06-Jan-1986 (29 y.o. Male) Treating RN: Clover Mealy, RN, BSN, [place] Sink Primary Care Physician: Bethann Punches Other Clinician: Referring Physician: Bethann Punches Treating Physician/Extender: Maxwell Caul Weeks in Treatment: 5 Constitutional Sitting or standing Blood Pressure is within target range for patient.. Pulse regular and within target range for patient.Marland Kitchen Respirations regular, non-labored and within target range.. Temperature is normal and within the target range for the patient.Marland Kitchen Respiratory Respiratory effort is easy and symmetric  bilaterally. Rate is normal at rest and on room air.. Bilateral breath sounds are clear and equal in all lobes with no wheezes, rales or rhonchi.. Cardiovascular Heart rhythm and rate regular,  without murmur or gallop.. Pedal pulses palpable and strong bilaterally.. Gastrointestinal (GI) Abdomen is soft and non-distended without masses or tenderness. Bowel sounds active in all quadrants.. No liver or spleen enlargement or tenderness.. Notes Wound exam; the wound is on the Achilles area of the left heel. No debridement was required to. I am hopeful that this is close to closing. There is no evidence of infection or ischemia Electronic Signature(s) Signed: 09/13/2015 5:04:49 PM By: Baltazar Najjar MD Entered By: Baltazar Najjar on 09/12/2015 14:27:05 [name] [name] ([id number]) -------------------------------------------------------------------------------- Physician Orders Details Patient Name: [name] [name] Date of Service: 09/12/2015 12:45 PM Medical Record Number: [id number] Patient Account Number: [id number] Date of Birth/Sex: Apr 23, 1986 (29 y.o. Male) Treating RN: Clover Mealy, RN, BSN, [place] Sink Primary Care Physician: Bethann Punches Other Clinician: Referring Physician: Bethann Punches Treating Physician/Extender: Altamese [place] in Treatment: 5 Verbal / Phone Orders: Yes Clinician: Afful, RN, BSN, Rita Read Back and Verified: Yes Diagnosis Coding Wound Cleansing Wound #1 Left Calcaneus o Clean wound with Normal Saline. Anesthetic Wound #1 Left Calcaneus o Topical Lidocaine 4% cream applied to wound bed prior to debridement Skin Barriers/Peri-Wound Care Wound #1 Left Calcaneus o Skin Prep Primary Wound Dressing Wound #1 Left Calcaneus o Prisma Ag Secondary Dressing o Boardered Foam Dressing Dressing Change Frequency Wound #1 Left Calcaneus o Change dressing every other day. Follow-up Appointments Wound #1 Left Calcaneus o Return Appointment in 1 week. Electronic  Signature(s) Signed: 09/12/2015 4:45:24 PM By: Elpidio Eric BSN, RN Signed: 09/13/2015 5:04:49 PM By: Baltazar Najjar MD Entered By: Elpidio Eric on 09/12/2015 13:21:49 [name] [name] ([id number]) -------------------------------------------------------------------------------- Problem List Details Patient Name: [name] [name] Date of Service: 09/12/2015 12:45 PM Medical Record Number: [id number] Patient Account Number: [id number] Date of Birth/Sex: 1986-02-22 (29 y.o. Male) Treating RN: Clover Mealy, RN, BSN, [place] Sink Primary Care Physician: Bethann Punches Other Clinician: Referring Physician: Bethann Punches Treating Physician/Extender: Altamese [place] in Treatment: 5 Active Problems ICD-10 Encounter Code Description Active Date Diagnosis [phone] Pressure ulcer of left heel, stage 2 08/08/2015 Yes Z87.820 Personal history of traumatic brain injury 08/08/2015 Yes Inactive Problems Resolved Problems Electronic Signature(s) Signed: 09/13/2015 5:04:49 PM By: Baltazar Najjar MD Entered By: Baltazar Najjar on 09/12/2015 14:24:39 [name] [name] ([id number]) -------------------------------------------------------------------------------- Progress Note Details Patient Name: [name] [name] Date of Service: 09/12/2015 12:45 PM Medical Record Number: [id number] Patient Account Number: [id number] Date of Birth/Sex: [birth year] (29 y.o. Male) Treating RN: Clover Mealy, RN, BSN, [place] Sink Primary Care Physician: Bethann Punches Other Clinician: Referring Physician: Bethann Punches Treating Physician/Extender: Altamese [place] in Treatment: 5 Subjective Chief Complaint Information obtained from Caregiver Patient is at the clinic for treatment of an open pressure ulcer to his left heel which she's had for about 4-5 months History of Present Illness (HPI) The following HPI elements were documented for the patient's wound: Location: left heel Quality: Patient reports No Pain. Severity: Patient states wound are getting  worse. Duration: Patient has had the wound for > 3 months prior to seeking treatment at the wound center Context: The wound appeared gradually over time Modifying Factors: Other treatment(s) tried include:local care with some silver and zinc paste 30 year old male who has a history of traumatic brain injury and also has a ulcer on his left heel which was noted by the family about 4-5 months ago. The TBI was in January 2013 and prior to this visit the patient has had hyperbaric oxygen therapy approximately 2 years after his TBI. He is had several gastrostomy tube placed by the gastroenterologist but then finally had  a surgical procedure to bring the gastric mucosa to the skin surface. This was done approximately 9 months ago. Past medical history of hepatitis C, traumatic brain injury, pulmonary emboli, ileostomy, tracheostomy, PEG tube placement. Was a former smoker and quit in January 2013 09/05/15; this is a 30 year old man who has a history of traumatic brain injury. He has fed through a PEG tube. He has a pressure ulcer on the left heel which he had for many months. Currently they've been using Santyl to the heel wound which had a surface eschar. There are returning today in follow-up 09/12/15; I saw this patient last week for the first time. He has a pressure ulcer on the left heel which she had had for many months. He was using Santyl and didn't have any product. We divided the wound and changed him to Silver collagen which apparently arrived at his house the next day Objective Constitutional [name] [name] [name] [name] ([id number]) Sitting or standing Blood Pressure is within target range for patient.. Pulse regular and within target range for patient.Marland Kitchen Respirations regular, non-labored and within target range.. Temperature is normal and within the target range for the patient.. Vitals Time Taken: 12:57 PM, Height: 72 in, Weight: 200 lbs, BMI: 27.1, Temperature: 97.7 F, Pulse: 72 bpm, Respiratory  Rate: 18 breaths/min, Blood Pressure: 120/69 mmHg. Respiratory Respiratory effort is easy and symmetric bilaterally. Rate is normal at rest and on room air.. Bilateral breath sounds are clear and equal in all lobes with no wheezes, rales or rhonchi.. Cardiovascular Heart rhythm and rate regular, without murmur or gallop.. Pedal pulses palpable and strong bilaterally.. Gastrointestinal (GI) Abdomen is soft and non-distended without masses or tenderness. Bowel sounds active in all quadrants.. No liver or spleen enlargement or tenderness.. General Notes: Wound exam; the wound is on the Achilles area of the left heel. No debridement was required to. I am hopeful that this is close to closing. There is no evidence of infection or ischemia Integumentary (Hair, Skin) Wound #1 status is Open. Original cause of wound was Gradually Appeared. The wound is located on the Left Calcaneus. The wound measures 1cm length x 1cm width x 0.3cm depth; 0.785cm^2 area and 0.236cm^3 volume. The wound is limited to skin breakdown. There is no tunneling or undermining noted. There is a large amount of serosanguineous drainage noted. The wound margin is distinct with the outline attached to the wound base. There is medium (34-66%) pink, pale granulation within the wound bed. There is a medium (34-66%) amount of necrotic tissue within the wound bed including Adherent Slough. The periwound skin appearance exhibited: Localized Edema, Moist. The periwound skin appearance did not exhibit: Callus, Crepitus, Excoriation, Fluctuance, Friable, Induration, Rash, Scarring, Dry/Scaly, Maceration, Atrophie Blanche, Cyanosis, Ecchymosis, Hemosiderin Staining, Mottled, Pallor, Rubor, Erythema. Periwound temperature was noted as No Abnormality. The periwound has tenderness on palpation. Assessment Active Problems ICD-10 W09.811 - Pressure ulcer of left heel, stage 2 Z87.820 - Personal history of traumatic brain injury [name],  [name] Coder ([id number]) Plan Wound Cleansing: Wound #1 Left Calcaneus: Clean wound with Normal Saline. Anesthetic: Wound #1 Left Calcaneus: Topical Lidocaine 4% cream applied to wound bed prior to debridement Skin Barriers/Peri-Wound Care: Wound #1 Left Calcaneus: Skin Prep Primary Wound Dressing: Wound #1 Left Calcaneus: Prisma Ag Secondary Dressing: Boardered Foam Dressing Dressing Change Frequency: Wound #1 Left Calcaneus: Change dressing every other day. Follow-up Appointments: Wound #1 Left Calcaneus: Return Appointment in 1 week. #1 continue with the collagen/foam dressings. These are changed every second day. We will see him  again in a week I am hopeful for some progression towards closure. I discussed offloading this area they are already doing this and have bunny boots in place at night Electronic Signature(s) Signed: 09/13/2015 5:04:49 PM By: Baltazar Najjar MD Entered By: Baltazar Najjar on 09/12/2015 14:27:53 [name] [name] ([id number]) -------------------------------------------------------------------------------- SuperBill Details Patient Name: [name] [name] Date of Service: 09/12/2015 Medical Record Number: [id number] Patient Account Number: [id number] Date of Birth/Sex: 12-09-1985 (29 y.o. Male) Treating RN: Clover Mealy, RN, BSN, [place] Sink Primary Care Physician: Bethann Punches Other Clinician: Referring Physician: Bethann Punches Treating Physician/Extender: Maxwell Caul Weeks in Treatment: 5 Diagnosis Coding ICD-10 Codes Code Description [id number] Pressure ulcer of left heel, stage 2 Z87.820 Personal history of traumatic brain injury Facility Procedures CPT4 Code: [id number] Description: [phone] - WOUND CARE VISIT-LEV 2 EST PT Modifier: Quantity: 1 Physician Procedures CPT4 Code: [id number] Description: 99213 - WC PHYS LEVEL 3 - EST PT ICD-10 Description Diagnosis L89.622 Pressure ulcer of left heel, stage 2 Modifier: Quantity: 1 Electronic Signature(s) Signed: 09/12/2015  4:43:17 PM By: Elpidio Eric BSN, RN Signed: 09/13/2015 5:04:49 PM By: Baltazar Najjar MD Entered By: Elpidio Eric on 09/12/2015 16:43:17

## 2015-09-15 ENCOUNTER — Ambulatory Visit: Payer: Commercial Managed Care - HMO | Admitting: Occupational Therapy

## 2015-09-15 ENCOUNTER — Ambulatory Visit: Payer: Commercial Managed Care - HMO | Admitting: Physical Therapy

## 2015-09-15 DIAGNOSIS — Z741 Need for assistance with personal care: Secondary | ICD-10-CM

## 2015-09-15 DIAGNOSIS — R460 Very low level of personal hygiene: Secondary | ICD-10-CM

## 2015-09-15 DIAGNOSIS — M6281 Muscle weakness (generalized): Secondary | ICD-10-CM

## 2015-09-15 DIAGNOSIS — R471 Dysarthria and anarthria: Secondary | ICD-10-CM | POA: Diagnosis not present

## 2015-09-15 DIAGNOSIS — Z9181 History of falling: Secondary | ICD-10-CM

## 2015-09-15 DIAGNOSIS — R531 Weakness: Secondary | ICD-10-CM

## 2015-09-15 DIAGNOSIS — Z7409 Other reduced mobility: Secondary | ICD-10-CM

## 2015-09-15 NOTE — Therapy (Signed)
[facility] MAIN [facility] SERVICES [address] [place], [place], [address] Phone: [phone]   Fax:  [phone]  Occupational Therapy Treatment  Patient Details  Name: [name] [name] MRN: [id number] Date of Birth: 12-09-1985 Referring Provider: Lysbeth Penner  Encounter Date: 09/15/2015      OT End of Session - 09/15/15 1202    Visit Number 3   Number of Visits 8   Date for OT Re-Evaluation 11/15/15   OT Start Time 1015   OT Stop Time 1100   OT Time Calculation (min) 45 min      Past Medical History  Diagnosis Date  . Hepatitis C   . Scoliosis   . TBI (traumatic brain injury) (HCC) 07/2011  . Subarachnoid hemorrhage (HCC)   . Pulmonary emboli (HCC)   . Hepatitis C   . Depression     Past Surgical History  Procedure Laterality Date  . Fracture surgery      left acetabulum  . Ileostomy    . Lung tube    . Ankle surgery Bilateral   . Tracheostomy    . Peg tube placement    . [place] gastrostomy  07/02/2014  . Ivc filter placement ([facility] hx)    . Brain surgery      There were no vitals filed for this visit.  Visit Diagnosis:  Self-care deficit for dressing and grooming  Weakness      Subjective Assessment - 09/15/15 1147    Subjective  Pt.'s mother is in the hospital. Pt. has plans to visit her following his treatment session.   Patient is accompained by: Family member   Pertinent History TBI 2013    Currently in Pain? No/denies                      OT Treatments/Exercises (OP) - 09/15/15 0001    ADLs   ADL Comments Pt. was able to grasp a tissue from the bow with his right hand, however was unable to follow commands to complete 1 step hand to face patterns for light grooming wiping face.   Neurological Re-education Exercises   Other Exercises 1 Pt. was seen for PROM and AAROM to the RUE for shoulder flexion abduction, elbow flexion, extension, wrist flexion extension, digit MP, PIP, DIP flexion, and extension. Pt.  performed LUE PROM for shoulder flexion, abduction, adduction, elbow flexion, extension, digit MP, PIP, and DIP flexion, extension. Pt. presented with fluctuating tone in his RUE and hand. Pt. intermittently able to actively use RUE upon command. Pt. LUE presented in a flexor synergy pattern upon arrival with increased flexor tone.   Other Exercises 2 AAROM with a 1# dowel with assist for LUE, and max cues, as pt. was distracted by constant rapping throughout session.                OT Education - 09/15/15 1201    Education provided Yes   Education Details UE ROM, positioning, protecting palm.    Person(s) Educated Patient;Caregiver(s)   Methods Explanation;Demonstration;Tactile cues;Verbal cues             OT Long Term Goals - 08/23/15 1628    OT LONG TERM GOAL #1   Title Patient will increase active motion in R UE sufficient to self feed with or with out assistive devices.   Baseline Unable to feed self any more.   Time 12   Period Weeks   Status New   OT LONG TERM GOAL #2  Title Patient will show improved upper extremity function to be able to use cell phone.   Baseline unable to use cell phone   Time 12   Period Weeks   Status New   OT LONG TERM GOAL #3   Title Patient will improve upper extremity function to be able to be able to wash his face with or with out assistive devices.   Baseline Unable to wash face.   Time 12   Period Days   Status New   OT LONG TERM GOAL #4   Title Will improve R UE range of motion to be able to bruch teeth with or with out assistive devices.   Baseline Unable to brush teeth.   Time 12   Period Weeks   Status New   OT LONG TERM GOAL #5   Title Will be able to positioin L UE to prevent further contractures and skin break down.   Baseline Patient's hand is very tight and arm is stuck in synergy.   Time 12   Period Weeks   Status New               Plan - 09/15/15 1212    Consulted and Agree with Plan of Care Family  member/caregiver;Patient   Family Member Consulted cargiver        Problem List Patient Active Problem List   Diagnosis Date Noted  . Colitis, Clostridium difficile 06/19/2015  . Hypoxia 05/25/2015  . Clostridium difficile infection 05/25/2015  . Mucus plugging of bronchi 05/25/2015  . Low grade fever 05/25/2015  . History of pulmonary embolism 05/25/2015  . OSA on CPAP 05/25/2015  . Pressure ulcer 05/25/2015  . Convulsions/seizures (HCC) 02/10/2015  . Aspiration pneumonia (HCC) 02/07/2015  . Encephalopathy, metabolic 02/07/2015  . Acute respiratory failure (HCC) 01/29/2015  . Skin irritation 11/09/2014  . TBI (traumatic brain injury) (HCC) 02/04/2012  . 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 Heterotopic ossification 02/04/2012   [name] Messier, MS, OTR/L  [name] [name] 09/15/2015, 12:16 PM  [facility] MAIN [facility] SERVICES [address] [place], [place], [address] Phone: [phone]   Fax:  [phone]  Name: [name] [name] MRN: [id number] Date of Birth: August 03, 1985

## 2015-09-15 NOTE — Therapy (Signed)
[facility] MAIN [facility] SERVICES [address] [place], [place], [address] Phone: [phone]   Fax:  [phone]  Physical Therapy Treatment  Patient Details  Name: [name] [name] MRN: [id number] Date of Birth: [birth year] Referring Provider: [name] MD  Encounter Date: 09/15/2015      PT End of Session - 09/15/15 1024    Visit Number 5   Number of Visits 25   Date for PT Re-Evaluation 11/16/15   PT Start Time 0935   PT Stop Time 1015   PT Time Calculation (min) 40 min   Activity Tolerance Treatment limited secondary to agitation   Behavior During Therapy Anxious;Restless      Past Medical History  Diagnosis Date  . Hepatitis C   . Scoliosis   . TBI (traumatic brain injury) (HCC) 07/2011  . Subarachnoid hemorrhage (HCC)   . Pulmonary emboli (HCC)   . Hepatitis C   . Depression     Past Surgical History  Procedure Laterality Date  . Fracture surgery      left acetabulum  . Ileostomy    . Lung tube    . Ankle surgery Bilateral   . Tracheostomy    . Peg tube placement    . Janeway gastrostomy  07/02/2014  . Ivc filter placement ([facility] hx)    . Brain surgery      There were no vitals filed for this visit.  Visit Diagnosis:  Muscular weakness  Risk for falls  Impaired mobility      Subjective Assessment - 09/15/15 1022    Subjective "I like sweets" Patient presents to therapy eating his cheese danish. Caregiver reports that they had a later start today;   Patient is accompained by: Family member   Pertinent History personal factors affecting rehab: chronic condition, high fall risk; LLE hip heterotrophic osscification; impaired cognition, ranchos scale #4 with inappropriate behavior at times;    How long can you sit comfortably? na   How long can you stand comfortably? able to stand for a few seconds with max A; stands in stander for 45 min at a time 2x a day;   How long can you walk comfortably? unable to ambulate at  this time   Patient Stated Goals having leg pain, hoping to work on HEP and mobility; Improve mobility prior to stem cell surgery;    Currently in Pain? No/denies       TREATMENT: Patient was max A +2 for transferring wheelchair<>Nustep with hoyer lift requiring assistance for positioning feet; Patient instructed in Nustep with RUE and RLE level 1; Patient required max VCs and mod A for initiating Nustep movement. He was often distracted and complained about stiffness in right leg requiring assistance to increase knee flexion. Patient able to initiate RUE/RLE movement independently for approximately 10-15 sec at a time. He required mod A for continuing movement for 2 min before stopping activity. Patient able to tolerate increased ROM on Nustep with less discomfort. He does have increased extensor tone in RLE which limited knee flexion; While on Nustep he also required assistance Min-mod A from 2nd person for holding self up in neutral and reducing right trunk/head lean;  Sitting in chair: throwing ball to basket x5 attempts with RUE with mod A for holding neutral position and mod VCs to increase RUE movement. Patient had difficulty relaxing right hand when throwing ball which limited its release making it difficult.  Patient would benefit from additional skilled treatment to work on  trunk control for better positioning when performing exercise.                           PT Education - 09/15/15 1024    Education provided Yes   Education Details Nustep, throwing ball   Person(s) Educated Patient   Methods Explanation;Verbal cues   Comprehension Verbalized understanding;Returned demonstration;Verbal cues required             PT Long Term Goals - 08/24/15 0909    PT LONG TERM GOAL #1   Title Caregivers will be independent in performing HEP with patient including PROM and AAROM exercise for increased joint flexibility by 11/16/15   Baseline Caregivers are doing some  PROM but would benefit from education to improve all joint mobility;   Time 12   Period Weeks   Status New   PT LONG TERM GOAL #2   Title Patient will be able to sit with 1 UE supported only with back unsupported keeping erect posture with supervision for ADLs such as dressing and bathing by 11/16/15   Baseline unable to sit unsupported without assistance;   Time 12   Period Weeks   Status New   PT LONG TERM GOAL #3   Title Patient will be mod A for initiating sit<>stand transfer with LRAD to improve functional mobility in the home by 11/16/15   Baseline requires max A to initiate standing but unable to stand without 2 person assist;   Time 12   Period Weeks   Status New   PT LONG TERM GOAL #4   Title Patient will be able to stand with 1 UE supported with CGA for at least 15 sec or more to improve standing tolerance for ADLs by 11/16/15   Baseline Patient unable to stand without heavy assistance; He is standing in stander for 45 min 2x a day but unable to stand with caregivers;    Time 12   Period Weeks   Status New               Plan - 09/15/15 1024    Clinical Impression Statement Instructed patient in RUE and RLE movement on Nustep; Patient seemed more excited about Nustep and more enthusiastic about exercise. He is max A +2 for transfer with hoyer lift. Patient did require increased cues for attention to task as he is still heavily distracted by hallucinations and pain; Patient also instructed in supported sitting and throwing ball with RUE; Patient requires mod A for sitting in chair upright as he demonstrates increased right lateral trunk lean; Patient would benefit from additional skilled PT intervention to improve trunk control and mobility;    Pt will benefit from skilled therapeutic intervention in order to improve on the following deficits Abnormal gait;Decreased endurance;Decreased skin integrity;Hypomobility;Impaired tone;Decreased activity tolerance;Decreased knowledge of  use of DME;Decreased strength;Increased muscle spasms;Decreased mobility;Decreased balance;Decreased cognition;Impaired perceived functional ability;Decreased safety awareness;Decreased coordination   Rehab Potential Fair   Clinical Impairments Affecting Rehab Potential positive: good caregiver support; negative: chronic condition; Patient's current clinical presentation is unstable as he has impaired congnition, behavioral changes and mobility deficits that change regularly due to TBI presentation;    PT Frequency 2x / week   PT Duration 12 weeks   PT Treatment/Interventions ADLs/Self Care Home Management;Cryotherapy;Electrical Stimulation;Moist Heat;Balance training;Therapeutic exercise;Therapeutic activities;Functional mobility training;Gait training;DME Instruction;Neuromuscular re-education;Patient/family education;Taping;Energy conservation;Passive range of motion   PT Next Visit Plan work on bed mobility; advance sitting exercise, Nustep  PT Home Exercise Plan continue as previously given   Consulted and Agree with Plan of Care Patient;Family member/caregiver   Family Member Consulted mom        Problem List Patient Active Problem List   Diagnosis Date Noted  . Colitis, Clostridium difficile 06/19/2015  . Hypoxia 05/25/2015  . Clostridium difficile infection 05/25/2015  . Mucus plugging of bronchi 05/25/2015  . Low grade fever 05/25/2015  . History of pulmonary embolism 05/25/2015  . OSA on CPAP 05/25/2015  . Pressure ulcer 05/25/2015  . Convulsions/seizures (HCC) 02/10/2015  . Aspiration pneumonia (HCC) 02/07/2015  . Encephalopathy, metabolic 02/07/2015  . Acute respiratory failure (HCC) 01/29/2015  . Skin irritation 11/09/2014  . TBI (traumatic brain injury) (HCC) 02/04/2012  . 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 Heterotopic ossification 02/04/2012    Daneshia Tavano PT, DPT 09/15/2015, 10:28 AM  [facility] MAIN [facility]  SERVICES [address] [place], [place], [address] Phone: [phone]   Fax:  [phone]  Name: Harrell Niehoff MRN: [id number] Date of Birth: Jun 12, 1986

## 2015-09-18 ENCOUNTER — Encounter: Payer: 59 | Admitting: Occupational Therapy

## 2015-09-18 ENCOUNTER — Encounter: Payer: 59 | Admitting: Speech Pathology

## 2015-09-19 ENCOUNTER — Ambulatory Visit: Payer: Commercial Managed Care - HMO | Admitting: Occupational Therapy

## 2015-09-19 ENCOUNTER — Encounter: Payer: 59 | Admitting: Occupational Therapy

## 2015-09-19 ENCOUNTER — Encounter: Payer: Self-pay | Admitting: Physical Therapy

## 2015-09-19 ENCOUNTER — Encounter: Payer: 59 | Admitting: Internal Medicine

## 2015-09-19 ENCOUNTER — Ambulatory Visit: Payer: 59 | Admitting: Physical Therapy

## 2015-09-19 ENCOUNTER — Ambulatory Visit: Payer: Commercial Managed Care - HMO | Admitting: Physical Therapy

## 2015-09-19 ENCOUNTER — Encounter: Payer: Self-pay | Admitting: Occupational Therapy

## 2015-09-19 DIAGNOSIS — M6281 Muscle weakness (generalized): Secondary | ICD-10-CM

## 2015-09-19 DIAGNOSIS — R279 Unspecified lack of coordination: Secondary | ICD-10-CM

## 2015-09-19 DIAGNOSIS — L89622 Pressure ulcer of left heel, stage 2: Secondary | ICD-10-CM | POA: Diagnosis not present

## 2015-09-19 DIAGNOSIS — Z7409 Other reduced mobility: Secondary | ICD-10-CM

## 2015-09-19 DIAGNOSIS — R6339 Other feeding difficulties: Secondary | ICD-10-CM

## 2015-09-19 DIAGNOSIS — Z9181 History of falling: Secondary | ICD-10-CM

## 2015-09-19 DIAGNOSIS — R531 Weakness: Secondary | ICD-10-CM

## 2015-09-19 DIAGNOSIS — R471 Dysarthria and anarthria: Secondary | ICD-10-CM | POA: Diagnosis not present

## 2015-09-19 DIAGNOSIS — R633 Feeding difficulties: Secondary | ICD-10-CM

## 2015-09-19 NOTE — Therapy (Signed)
[facility] MAIN [facility] SERVICES [address] [place], [place], [address] Phone: [phone]   Fax:  [phone]  Occupational Therapy Treatment  Patient Details  Name: [name] [name] MRN: [id number] Date of Birth: Aug 08, 1985 Referring Provider: Lysbeth Penner  Encounter Date: 09/19/2015      OT End of Session - 09/19/15 1101    Visit Number 4   Number of Visits 8   Date for OT Re-Evaluation 11/15/15   OT Start Time 0945   OT Stop Time 1030   OT Time Calculation (min) 45 min      Past Medical History  Diagnosis Date  . Hepatitis C   . Scoliosis   . TBI (traumatic brain injury) (HCC) 07/2011  . Subarachnoid hemorrhage (HCC)   . Pulmonary emboli (HCC)   . Hepatitis C   . Depression     Past Surgical History  Procedure Laterality Date  . Fracture surgery      left acetabulum  . Ileostomy    . Lung tube    . Ankle surgery Bilateral   . Tracheostomy    . Peg tube placement    . Janeway gastrostomy  07/02/2014  . Ivc filter placement ([facility] hx)    . Brain surgery      There were no vitals filed for this visit.  Visit Diagnosis:  Weakness generalized  Lack of coordination  Self-care deficit for feeding      Subjective Assessment - 09/19/15 1040    Subjective  Pt. is planning to go to [facility] following the therapy sessions.   Patient is accompained by: Family member   Pertinent History Pt. suffered a TBI   Patient Stated Goals Patient wants to be able to do more for himself, use his phone better.     Currently in Pain? No/denies   Pain Score 0-No pain                      OT Treatments/Exercises (OP) - 09/19/15 1058    ADLs   ADL Comments Pt. performed hand to mouth patterns of movement in preparation for drinking, and using utensils. Pt. was able to grasp an empty cup and bring it to his mouth. Several styles and types of cups were attempted.  Pt. performed better with a solid cup with a handle versus a plastic cup  which pt. tends to over grip.  Pt. was able to pick up the cup and place it down on a flat surface. Pt. performed scooping with a spoon and practiced movements needed in preparation for self-feeding with his right hand. Pt. required extensive verbal cues to perform the task slowly. Pt. utilized a mature grasp on the utensil, and was able to maintain it while scooping and bringing it to his mouth.   Neurological Re-education Exercises   Other Exercises 1 Pt. performed AAROM to the RUE and hand with PROM to the end ROM for shoulder flexion, abduction, horizontal shoulder abduction/adduction, elbow flexion/extension, forearm supination/pronation, wrist flexion/extension, digit MP, PIP, and DIP flexion/extension. Pt. participated in and tolerated PROM to LUE in all joint ranges. Pt. with good palmar skin integrity today.                OT Education - 09/19/15 1059    Education provided Yes   Education Details Slow gentle ROM, maintaning good palmar skin integrity, ROM for ADLs and hand to face patterns for self-feeding.  OT Long Term Goals - 08/23/15 1628    OT LONG TERM GOAL #1   Title Patient will increase active motion in R UE sufficient to self feed with or with out assistive devices.   Baseline Unable to feed self any more.   Time 12   Period Weeks   Status New   OT LONG TERM GOAL #2   Title Patient will show improved upper extremity function to be able to use cell phone.   Baseline unable to use cell phone   Time 12   Period Weeks   Status New   OT LONG TERM GOAL #3   Title Patient will improve upper extremity function to be able to be able to wash his face with or with out assistive devices.   Baseline Unable to wash face.   Time 12   Period Days   Status New   OT LONG TERM GOAL #4   Title Will improve R UE range of motion to be able to bruch teeth with or with out assistive devices.   Baseline Unable to brush teeth.   Time 12   Period Weeks   Status  New   OT LONG TERM GOAL #5   Title Will be able to positioin L UE to prevent further contractures and skin break down.   Baseline Patient's hand is very tight and arm is stuck in synergy.   Time 12   Period Weeks   Status New               Plan - 09/19/15 1102    Clinical Impression Statement Pt. presented with good atttention span throughout the entire treatment session. Pt. presesnted with decreased tone through his LUE and RUE. Left hand and digits continue to present with tone and tightness. Pt. was able to actively engage his RUE in light ADL tasks upon command with less cues for initiation and  follow through. Pt. does require extensive cues for attemtpting to perform the task more slowly..   Pt will benefit from skilled therapeutic intervention in order to improve on the following deficits (Retired) Decreased activity tolerance;Decreased coordination;Decreased endurance;Decreased range of motion;Decreased skin integrity;Decreased strength;Impaired UE functional use;Impaired tone   Rehab Potential Fair   Clinical Impairments Affecting Rehab Potential Has an aide, TBI and co-morbidities, limitted ability to participate   OT Duration 8 weeks   OT Treatment/Interventions Self-care/ADL training;Neuromuscular education;Therapeutic exercise;Energy conservation;Manual Therapy;Therapeutic activities;Therapeutic exercises;Splinting;Passive range of motion;Patient/family education   Consulted and Agree with Plan of Care Family member/caregiver;Patient   Family Member Consulted cargiver        Problem List Patient Active Problem List   Diagnosis Date Noted  . Colitis, Clostridium difficile 06/19/2015  . Hypoxia 05/25/2015  . Clostridium difficile infection 05/25/2015  . Mucus plugging of bronchi 05/25/2015  . Low grade fever 05/25/2015  . History of pulmonary embolism 05/25/2015  . OSA on CPAP 05/25/2015  . Pressure ulcer 05/25/2015  . Convulsions/seizures (HCC) 02/10/2015  .  Aspiration pneumonia (HCC) 02/07/2015  . Encephalopathy, metabolic 02/07/2015  . Acute respiratory failure (HCC) 01/29/2015  . Skin irritation 11/09/2014  . TBI (traumatic brain injury) (HCC) 02/04/2012  . 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 Heterotopic ossification 02/04/2012   Olegario Messier, MS, OTR/L  Olegario Messier 09/19/2015, 11:12 AM  [facility] MAIN [facility] SERVICES [address] [place], [place], [address] Phone: [phone]   Fax:  [phone]  Name: [name] [name] MRN: [id number] Date of Birth: Mar 31, 1986

## 2015-09-19 NOTE — Therapy (Signed)
[facility] MAIN [facility] SERVICES [address] [place], [place], [address] Phone: [phone]   Fax:  [phone]  Physical Therapy Treatment  Patient Details  Name: [name] [name] MRN: [id number] Date of Birth: 1985-10-13 Referring Provider: [name] MD  Encounter Date: 09/19/2015      PT End of Session - 09/19/15 1001    Visit Number 6   Number of Visits 25   Date for PT Re-Evaluation 11/16/15   PT Start Time 0850   PT Stop Time 0945   PT Time Calculation (min) 55 min   Activity Tolerance Patient tolerated treatment well   Behavior During Therapy Anxious;Restless      Past Medical History  Diagnosis Date  . Hepatitis C   . Scoliosis   . TBI (traumatic brain injury) (HCC) 07/2011  . Subarachnoid hemorrhage (HCC)   . Pulmonary emboli (HCC)   . Hepatitis C   . Depression     Past Surgical History  Procedure Laterality Date  . Fracture surgery      left acetabulum  . Ileostomy    . Lung tube    . Ankle surgery Bilateral   . Tracheostomy    . Peg tube placement    . Janeway gastrostomy  07/02/2014  . Ivc filter placement ([facility] hx)    . Brain surgery      There were no vitals filed for this visit.  Visit Diagnosis:  Impaired mobility  Muscular weakness  Risk for falls      Subjective Assessment - 09/19/15 0953    Subjective "The devil has kept me from moving my legs. They freaking hurt." Caregiver reports that they have been making him use his right arm for rolling to the left; He still has difficulty rolling to the right in the bed;    Patient is accompained by: Family member   Pertinent History personal factors affecting rehab: chronic condition, high fall risk; LLE hip heterotrophic osscification; impaired cognition, ranchos scale #4 with inappropriate behavior at times;    How long can you sit comfortably? na   How long can you stand comfortably? able to stand for a few seconds with max A; stands in stander for 45  min at a time 2x a day;   How long can you walk comfortably? unable to ambulate at this time   Patient Stated Goals having leg pain, hoping to work on HEP and mobility; Improve mobility prior to stem cell surgery;    Currently in Pain? No/denies  at rest; reports pain with movement but unable to rate     TREATMENT: Patient was max A +2 for transferring wheelchair<>Nustep with hoyer lift requiring assistance for positioning feet; Patient instructed in Nustep with RUE and RLE level 1; Patient required max VCs and mod A for initiating Nustep movement. He was often distracted and complained about stiffness in right leg requiring assistance to increase knee flexion. Patient able to initiate RUE/RLE movement independently for approximately 15-20 sec at a time.  Patient able to tolerate increased ROM on Nustep with less discomfort. He does have increased extensor tone in RLE which limited knee flexion; Performed exercise level 0 x4 min with increased cues and min A for attention to task and to improve ROM; While on Nustep he also required assistance Min-mod A from 2nd person for holding self up in neutral and reducing right trunk/head lean;   Patient was max A +2 for transferring Nustep<>mat table and mat table<>wheelchair;  Sitting  edge of bed with max A +1 for maintaining sitting posture and aide in front of patient assisting with treatment exercise: Sitting: Using RUE for pulling self up into sitting (recline to sitting erect) x10 reps with mod A Lateral flexion trunk lean x5 each direction with mod VCs for positioning and to increase trunk lean for better trunk control;  Patient continues to demonstrate increased right cervical lateral flexion and cervical flexion having difficulty holding head up; PT provided mod tactile and verbal cues to work on holding self up in erect posture with 1 HHA on mat table, min A x10 sec;  Patient is mod A +2 for sitting to supine: PT performed passive cervical  lateral flexion stretch to left 15 sec hold x3 with cues to increase turing head to left for better left upper trap/levator muscle activation; PT re-educated patient's caregiver in importance of doing cervical stretches for increased flexibility;  PT instructed patient in rolling to right using LUE x1 rep, mod A +2 with mod VCs for reaching with LUE and to initiate LUE shoulder progression. Patient has increased difficulty with left UE movement as compared to RUE;                             PT Education - 09/19/15 0954    Education provided Yes   Education Details strengthening on Nustep; sitting posture, balance;    Person(s) Educated Patient;Caregiver(s)   Methods Explanation;Demonstration;Tactile cues;Verbal cues   Comprehension Verbalized understanding;Returned demonstration;Verbal cues required;Tactile cues required             PT Long Term Goals - 08/24/15 0909    PT LONG TERM GOAL #1   Title Caregivers will be independent in performing HEP with patient including PROM and AAROM exercise for increased joint flexibility by 11/16/15   Baseline Caregivers are doing some PROM but would benefit from education to improve all joint mobility;   Time 12   Period Weeks   Status New   PT LONG TERM GOAL #2   Title Patient will be able to sit with 1 UE supported only with back unsupported keeping erect posture with supervision for ADLs such as dressing and bathing by 11/16/15   Baseline unable to sit unsupported without assistance;   Time 12   Period Weeks   Status New   PT LONG TERM GOAL #3   Title Patient will be mod A for initiating sit<>stand transfer with LRAD to improve functional mobility in the home by 11/16/15   Baseline requires max A to initiate standing but unable to stand without 2 person assist;   Time 12   Period Weeks   Status New   PT LONG TERM GOAL #4   Title Patient will be able to stand with 1 UE supported with CGA for at least 15 sec or more  to improve standing tolerance for ADLs by 11/16/15   Baseline Patient unable to stand without heavy assistance; He is standing in stander for 45 min 2x a day but unable to stand with caregivers;    Time 12   Period Weeks   Status New               Plan - 09/19/15 1006    Clinical Impression Statement Instructed patient in RUE and RLE movement on nustep for increased joint mobility and cardiovascular conditioning. Patient continues to require increased cues for increased ROM and speed. Patient was transferred to mat table  with hoyer lift +2 assist. PT educated patient on sitting posture and sitting balance activities working on reaching and leaning difficulty directions for increased trunk control; Patient demonstrates increased right cervical lateral flexion and straight flexion with prolonged sitting unsupported having difficulty holding head up. PT performed passive stretches with patient supine; Re-educated caregiver in cervical stretches to reduce right upper trap tightness; Patient would benefit from additional skilled PT intervention to improve strength, trunk control and functional mobility;    Pt will benefit from skilled therapeutic intervention in order to improve on the following deficits Abnormal gait;Decreased endurance;Decreased skin integrity;Hypomobility;Impaired tone;Decreased activity tolerance;Decreased knowledge of use of DME;Decreased strength;Increased muscle spasms;Decreased mobility;Decreased balance;Decreased cognition;Impaired perceived functional ability;Decreased safety awareness;Decreased coordination   Rehab Potential Fair   Clinical Impairments Affecting Rehab Potential positive: good caregiver support; negative: chronic condition; Patient's current clinical presentation is unstable as he has impaired congnition, behavioral changes and mobility deficits that change regularly due to TBI presentation;    PT Frequency 2x / week   PT Duration 12 weeks   PT  Treatment/Interventions ADLs/Self Care Home Management;Cryotherapy;Electrical Stimulation;Moist Heat;Balance training;Therapeutic exercise;Therapeutic activities;Functional mobility training;Gait training;DME Instruction;Neuromuscular re-education;Patient/family education;Taping;Energy conservation;Passive range of motion   PT Next Visit Plan work on bed mobility; advance sitting exercise, Nustep   PT Home Exercise Plan work on rolling to right using LUE to pull self over;    Consulted and Agree with Plan of Care Patient;Family member/caregiver   Family Member Consulted mom        Problem List Patient Active Problem List   Diagnosis Date Noted  . Colitis, Clostridium difficile 06/19/2015  . Hypoxia 05/25/2015  . Clostridium difficile infection 05/25/2015  . Mucus plugging of bronchi 05/25/2015  . Low grade fever 05/25/2015  . History of pulmonary embolism 05/25/2015  . OSA on CPAP 05/25/2015  . Pressure ulcer 05/25/2015  . Convulsions/seizures (HCC) 02/10/2015  . Aspiration pneumonia (HCC) 02/07/2015  . Encephalopathy, metabolic 02/07/2015  . Acute respiratory failure (HCC) 01/29/2015  . Skin irritation 11/09/2014  . TBI (traumatic brain injury) (HCC) 02/04/2012  . 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 Heterotopic ossification 02/04/2012    Armenta Erskin  PT, DPT  09/19/2015, 12:38 PM  [facility] MAIN [facility] SERVICES [address] [place], [place], [address] Phone: [phone]   Fax:  [phone]  Name: [name] [name] MRN: [id number] Date of Birth: October 05, 1985

## 2015-09-20 ENCOUNTER — Encounter: Payer: 59 | Admitting: Speech Pathology

## 2015-09-20 NOTE — Progress Notes (Signed)
[name] [name] ([id number]) Visit Report for 09/19/2015 Arrival Information Details Patient Name: [name] [name] Date of Service: 09/19/2015 12:45 PM Medical Record Number: [id number] Patient Account Number: [id number] Date of Birth/Sex: 1986/01/02 (29 y.o. Male) Treating RN: Clover Mealy, RN, BSN, [place] Sink Primary Care Physician: Bethann Punches Other Clinician: Referring Physician: Bethann Punches Treating Physician/Extender: Altamese [place] in Treatment: 6 Visit Information History Since Last Visit Added or deleted any medications: No Patient Arrived: Wheel Chair Any new allergies or adverse reactions: No Arrival Time: 12:49 Had a fall or experienced change in No activities of daily living that may affect Accompanied By: caregiver risk of falls: Transfer Assistance: None Signs or symptoms of abuse/neglect since last No Patient Identification Verified: Yes visito Secondary Verification Process Yes Hospitalized since last visit: No Completed: Has Dressing in Place as Prescribed: Yes Patient Requires Transmission-Based No Pain Present Now: No Precautions: Patient Has Alerts: No Electronic Signature(s) Signed: 09/19/2015 5:12:37 PM By: Elpidio Eric BSN, RN Entered By: Elpidio Eric on 09/19/2015 12:49:53 [name] [name] ([id number]) -------------------------------------------------------------------------------- Clinic Level of Care Assessment Details Patient Name: [name] [name] Date of Service: 09/19/2015 12:45 PM Medical Record Number: [id number] Patient Account Number: [id number] Date of Birth/Sex: [birth year] (29 y.o. Male) Treating RN: Clover Mealy, RN, BSN, [place] Primary Care Physician: Bethann Punches Other Clinician: Referring Physician: Bethann Punches Treating Physician/Extender: Altamese [place] in Treatment: 6 Clinic Level of Care Assessment Items TOOL 4 Quantity Score  - Use when only an EandM is performed on FOLLOW-UP visit 0 ASSESSMENTS - Nursing Assessment / Reassessment X -  Reassessment of Co-morbidities (includes updates in patient status) 1 10 X - Reassessment of Adherence to Treatment Plan 1 5 ASSESSMENTS - Wound and Skin Assessment / Reassessment X - Simple Wound Assessment / Reassessment - one wound 1 5  - Complex Wound Assessment / Reassessment - multiple wounds 0  - Dermatologic / Skin Assessment (not related to wound area) 0 ASSESSMENTS - Focused Assessment  - Circumferential Edema Measurements - multi extremities 0  - Nutritional Assessment / Counseling / Intervention 0 X - Lower Extremity Assessment (monofilament, tuning fork, pulses) 1 5  - Peripheral Arterial Disease Assessment (using hand held doppler) 0 ASSESSMENTS - Ostomy and/or Continence Assessment and Care  - Incontinence Assessment and Management 0  - Ostomy Care Assessment and Management (repouching, etc.) 0 PROCESS - Coordination of Care X - Simple Patient / Family Education for ongoing care 1 15  - Complex (extensive) Patient / Family Education for ongoing care 0  - Staff obtains Chiropractor, Records, Test Results / Process Orders 0  - Staff telephones HHA, Nursing Homes / Clarify orders / etc 0  - Routine Transfer to another Facility (non-emergent condition) 0 [name] [name] ([id number])  - Routine Hospital Admission (non-emergent condition) 0  - New Admissions / Manufacturing engineer / Ordering NPWT, Apligraf, etc. 0  - Emergency Hospital Admission (emergent condition) 0  - Simple Discharge Coordination 0  - Complex (extensive) Discharge Coordination 0 PROCESS - Special Needs  - Pediatric / Minor Patient Management 0  - Isolation Patient Management 0  - Hearing / Language / Visual special needs 0  - Assessment of Community assistance (transportation, D/C planning, etc.) 0  - Additional assistance / Altered mentation 0  - Support Surface(s) Assessment (bed, cushion, seat, etc.) 0 INTERVENTIONS - Wound Cleansing / Measurement X - Simple  Wound Cleansing - one wound 1 5  - Complex Wound Cleansing - multiple wounds 0 X - Wound Imaging (photographs - any number of wounds) 1  5  - Wound Tracing (instead of photographs) 0 X - Simple Wound Measurement - one wound 1 5  - Complex Wound Measurement - multiple wounds 0 INTERVENTIONS - Wound Dressings X - Small Wound Dressing one or multiple wounds 1 10  - Medium Wound Dressing one or multiple wounds 0  - Large Wound Dressing one or multiple wounds 0  - Application of Medications - topical 0  - Application of Medications - injection 0 INTERVENTIONS - Miscellaneous  - External ear exam 0 [name] [name] ([id number])  - Specimen Collection (cultures, biopsies, blood, body fluids, etc.) 0  - Specimen(s) / Culture(s) sent or taken to Lab for analysis 0  - Patient Transfer (multiple staff / Michiel Sites Lift / Similar devices) 0  - Simple Staple / Suture removal (25 or less) 0  - Complex Staple / Suture removal (26 or more) 0  - Hypo / Hyperglycemic Management (close monitor of Blood Glucose) 0  - Ankle / Brachial Index (ABI) - do not check if billed separately 0 X - Vital Signs 1 5 Has the patient been seen at the hospital within the last three years: Yes Total Score: 70 Level Of Care: New/Established - Level 2 Electronic Signature(s) Signed: 09/19/2015 5:12:37 PM By: Elpidio Eric BSN, RN Entered By: Elpidio Eric on 09/19/2015 13:13:49 [name] [name] ([id number]) -------------------------------------------------------------------------------- Encounter Discharge Information Details Patient Name: [name] [name] Date of Service: 09/19/2015 12:45 PM Medical Record Number: [id number] Patient Account Number: [id number] Date of Birth/Sex: [birth year] (30 y.o. Male) Treating RN: Clover Mealy, RN, BSN, [place] Sink Primary Care Physician: Bethann Punches Other Clinician: Referring Physician: Bethann Punches Treating Physician/Extender: Altamese [place] in Treatment: 6 Encounter  Discharge Information Items Discharge Pain Level: 0 Discharge Condition: Stable Ambulatory Status: Wheelchair Discharge Destination: Home Private Transportation: Auto Accompanied By: caregiver Schedule Follow-up Appointment: No Medication Reconciliation completed and No provided to Patient/Care Vadhir Mcnay: Clinical Summary of Care: Electronic Signature(s) Signed: 09/19/2015 5:12:37 PM By: Elpidio Eric BSN, RN Entered By: Elpidio Eric on 09/19/2015 13:14:31 [name] [name] ([id number]) -------------------------------------------------------------------------------- Lower Extremity Assessment Details Patient Name: [name] [name] Date of Service: 09/19/2015 12:45 PM Medical Record Number: [id number] Patient Account Number: [id number] Date of Birth/Sex: 1985/10/10 (29 y.o. Male) Treating RN: Clover Mealy, RN, BSN, [place] Sink Primary Care Physician: Bethann Punches Other Clinician: Referring Physician: Bethann Punches Treating Physician/Extender: Maxwell Caul [place] in Treatment: 6 Vascular Assessment Pulses: Posterior Tibial Dorsalis Pedis Palpable: [Left:Yes] Extremity colors, hair growth, and conditions: Extremity Color: [Left:Normal] Hair Growth on Extremity: [Left:Yes] Temperature of Extremity: [Left:Warm] Capillary Refill: [Left:< 3 seconds] Electronic Signature(s) Signed: 09/19/2015 5:12:37 PM By: Elpidio Eric BSN, RN Entered By: Elpidio Eric on 09/19/2015 12:53:25 [name] [name] ([id number]) -------------------------------------------------------------------------------- Multi Wound Chart Details Patient Name: [name] [name] Date of Service: 09/19/2015 12:45 PM Medical Record Number: [id number] Patient Account Number: [id number] Date of Birth/Sex: 12/23/1985 (29 y.o. Male) Treating RN: Clover Mealy, RN, BSN, [place] Primary Care Physician: Bethann Punches Other Clinician: Referring Physician: Bethann Punches Treating Physician/Extender: Maxwell Caul [place] in Treatment: 6 Vital Signs Height(in):  72 Pulse(bpm): 64 Weight(lbs): 200 Blood Pressure 134/64 (mmHg): Body Mass Index(BMI): 27 Temperature(F): 98.2 Respiratory Rate 19 (breaths/min): Photos: [1:No Photos] [N/A:N/A] Wound Location: [1:Left Calcaneus] [N/A:N/A] Wounding Event: [1:Gradually Appeared] [N/A:N/A] Primary Etiology: [1:Infection - not elsewhere N/A classified] Comorbid History: [1:Hypertension, Hepatitis C, N/A History of pressure wounds, Paraplegia] Date Acquired: [1:06/11/2015] [N/A:N/A] Weeks of Treatment: [1:6] [N/A:N/A] Wound Status: [1:Open] [N/A:N/A] Measurements L x W x D 0.8x0.5x0.2 [N/A:N/A] (cm) Area (cm) : [1:0.314] [N/A:N/A] Volume (cm) : [1:0.063] [N/A:N/A] %  Reduction in Area: [1:76.50%] [N/A:N/A] % Reduction in Volume: 53.00% [N/A:N/A] Classification: [1:Full Thickness Without Exposed Support Structures] [N/A:N/A] Exudate Amount: [1:Large] [N/A:N/A] Exudate Type: [1:Serosanguineous] [N/A:N/A] Exudate Color: [1:red, brown] [N/A:N/A] Wound Margin: [1:Distinct, outline attached N/A] Granulation Amount: [1:Medium (34-66%)] [N/A:N/A] Granulation Quality: [1:Pink, Pale] [N/A:N/A] Necrotic Amount: [1:Medium (34-66%)] [N/A:N/A] Exposed Structures: [1:Fascia: No Fat: No Tendon: No] [N/A:N/A] Muscle: No Joint: No Bone: No Limited to Skin Breakdown Epithelialization: None N/A N/A Periwound Skin Texture: Edema: Yes N/A N/A Excoriation: No Induration: No Callus: No Crepitus: No Fluctuance: No Friable: No Rash: No Scarring: No Periwound Skin Moist: Yes N/A N/A Moisture: Maceration: No Dry/Scaly: No Periwound Skin Color: Atrophie Blanche: No N/A N/A Cyanosis: No Ecchymosis: No Erythema: No Hemosiderin Staining: No Mottled: No Pallor: No Rubor: No Temperature: No Abnormality N/A N/A Tenderness on Yes N/A N/A Palpation: Wound Preparation: Ulcer Cleansing: N/A N/A Rinsed/Irrigated with Saline Topical Anesthetic Applied: Other: lidocaine 4% Treatment Notes Electronic  Signature(s) Signed: 09/19/2015 5:12:37 PM By: Elpidio Eric BSN, RN Entered By: Elpidio Eric on 09/19/2015 12:58:58 [name] [name] ([id number]) -------------------------------------------------------------------------------- Multi-Disciplinary Care Plan Details Patient Name: [name] [name] Date of Service: 09/19/2015 12:45 PM Medical Record Number: [id number] Patient Account Number: [id number] Date of Birth/Sex: March 19, 1986 (29 y.o. Male) Treating RN: Clover Mealy, RN, BSN, [place] Sink Primary Care Physician: Bethann Punches Other Clinician: Referring Physician: Bethann Punches Treating Physician/Extender: Altamese [place] in Treatment: 6 Active Inactive Electronic Signature(s) Signed: 09/19/2015 5:12:37 PM By: Elpidio Eric BSN, RN Entered By: Elpidio Eric on 09/19/2015 12:58:05 [name] [name] ([id number]) -------------------------------------------------------------------------------- Pain Assessment Details Patient Name: [name] [name] Date of Service: 09/19/2015 12:45 PM Medical Record Number: [id number] Patient Account Number: [id number] Date of Birth/Sex: [birth year] (29 y.o. Male) Treating RN: Clover Mealy, RN, BSN, [place] Primary Care Physician: Bethann Punches Other Clinician: Referring Physician: Bethann Punches Treating Physician/Extender: Altamese [place] in Treatment: 6 Active Problems Location of Pain Severity and Description of Pain Patient Has Paino No Site Locations Pain Management and Medication Current Pain Management: Electronic Signature(s) Signed: 09/19/2015 5:12:37 PM By: Elpidio Eric BSN, RN Entered By: Elpidio Eric on 09/19/2015 12:50:01 [name] [name] ([id number]) -------------------------------------------------------------------------------- Patient/Caregiver Education Details Patient Name: [name] [name] Date of Service: 09/19/2015 12:45 PM Medical Record Number: [id number] Patient Account Number: [id number] Date of Birth/Gender: [birth year] (29 y.o. Male) Treating RN: Clover Mealy, RN, BSN,  [place] Sink Primary Care Physician: Bethann Punches Other Clinician: Referring Physician: Bethann Punches Treating Physician/Extender: Altamese [place] in Treatment: 6 Education Assessment Education Provided To: Caregiver Education Topics Provided Offloading: Methods: Explain/Verbal Responses: State content correctly Wound/Skin Impairment: Methods: Explain/Verbal Responses: State content correctly Electronic Signature(s) Signed: 09/19/2015 5:12:37 PM By: Elpidio Eric BSN, RN Entered By: Elpidio Eric on 09/19/2015 13:15:47 [name] [name] ([id number]) -------------------------------------------------------------------------------- Wound Assessment Details Patient Name: [name] [name] Date of Service: 09/19/2015 12:45 PM Medical Record Number: [id number] Patient Account Number: [id number] Date of Birth/Sex: Nov 29, 1985 (29 y.o. Male) Treating RN: Clover Mealy, RN, BSN, [place] Primary Care Physician: Bethann Punches Other Clinician: Referring Physician: Bethann Punches Treating Physician/Extender: Altamese [place] in Treatment: 6 Wound Status Wound Number: 1 Primary Infection - not elsewhere classified Etiology: Wound Location: Left Calcaneus Wound Open Wounding Event: Gradually Appeared Status: Date Acquired: 06/11/2015 Comorbid Hypertension, Hepatitis C, History of Weeks Of Treatment: 6 History: pressure wounds, Paraplegia Clustered Wound: No Photos Photo Uploaded By: Elpidio Eric on 09/19/2015 17:17:46 Wound Measurements Length: (cm) 0.8 Width: (cm) 0.5 Depth: (cm) 0.2 Area: (cm) 0.314 Volume: (cm) 0.063 % Reduction in Area: 76.5% % Reduction in Volume: 53% Epithelialization: None Tunneling: No  Undermining: No Wound Description Full Thickness Without Exposed Classification: Support Structures Wound Margin: Distinct, outline attached Exudate Large Amount: Exudate Type: Serosanguineous Exudate Color: red, brown Foul Odor After Cleansing: No Wound Bed Granulation Amount:  Medium (34-66%) Exposed Structure Granulation Quality: Pink, Pale Fascia Exposed: No Necrotic Amount: Medium (34-66%) Fat Layer Exposed: No Ozment, Earna Coder ([id number]) Necrotic Quality: Adherent Slough Tendon Exposed: No Muscle Exposed: No Joint Exposed: No Bone Exposed: No Limited to Skin Breakdown Periwound Skin Texture Texture Color No Abnormalities Noted: No No Abnormalities Noted: No Callus: No Atrophie Blanche: No Crepitus: No Cyanosis: No Excoriation: No Ecchymosis: No Fluctuance: No Erythema: No Friable: No Hemosiderin Staining: No Induration: No Mottled: No Localized Edema: Yes Pallor: No Rash: No Rubor: No Scarring: No Temperature / Pain Moisture Temperature: No Abnormality No Abnormalities Noted: No Tenderness on Palpation: Yes Dry / Scaly: No Maceration: No Moist: Yes Wound Preparation Ulcer Cleansing: Rinsed/Irrigated with Saline Topical Anesthetic Applied: Other: lidocaine 4%, Treatment Notes Wound #1 (Left Calcaneus) 1. Cleansed with: Clean wound with Normal Saline 4. Dressing Applied: Prisma Ag 5. Secondary Dressing Applied Bordered Foam Dressing Notes sage boot Electronic Signature(s) Signed: 09/19/2015 5:12:37 PM By: Elpidio Eric BSN, RN Entered By: Elpidio Eric on 09/19/2015 12:57:55 [name] [name] ([id number]) -------------------------------------------------------------------------------- Vitals Details Patient Name: [name] [name] Date of Service: 09/19/2015 12:45 PM Medical Record Number: [id number] Patient Account Number: [id number] Date of Birth/Sex: 1985/08/28 (29 y.o. Male) Treating RN: Clover Mealy, RN, BSN, [place] Primary Care Physician: Bethann Punches Other Clinician: Referring Physician: Bethann Punches Treating Physician/Extender: Altamese [place] in Treatment: 6 Vital Signs Time Taken: 12:52 Temperature (F): 98.2 Height (in): 72 Pulse (bpm): 64 Weight (lbs): 200 Respiratory Rate (breaths/min): 19 Body Mass Index (BMI):  27.1 Blood Pressure (mmHg): 134/64 Reference Range: 80 - 120 mg / dl Electronic Signature(s) Signed: 09/19/2015 5:12:37 PM By: Elpidio Eric BSN, RN Entered By: Elpidio Eric on 09/19/2015 12:53:10

## 2015-09-20 NOTE — Progress Notes (Signed)
[name], [name] ([id number]) Visit Report for 09/19/2015 Chief Complaint Document Details Patient Name: [name], [name] Date of Service: 09/19/2015 12:45 PM Medical Record Number: [id number] Patient Account Number: [id number] Date of Birth/Sex: Dec 14, 1985 (29 y.[name]. Male) Treating RN: Clover Mealy, RN, BSN, [place] Sink Primary Care Physician: Bethann Punches Other Clinician: Referring Physician: Bethann Punches Treating Physician/Extender: Altamese [place] in Treatment: 6 Information Obtained from: Caregiver Chief Complaint Patient is at the clinic for treatment of an open pressure ulcer to his left heel which she's had for about 4-5 months Electronic Signature(s) Signed: 09/20/2015 8:05:15 AM By: Baltazar Najjar MD Entered By: Baltazar Najjar on 09/19/2015 13:14:49 [name] [name] ([id number]) -------------------------------------------------------------------------------- HPI Details Patient Name: [name] [name] Date of Service: 09/19/2015 12:45 PM Medical Record Number: [id number] Patient Account Number: [id number] Date of Birth/Sex: 08-23-1985 (29 y.[name]. Male) Treating RN: Clover Mealy, RN, BSN, [place] Sink Primary Care Physician: Bethann Punches Other Clinician: Referring Physician: Bethann Punches Treating Physician/Extender: Maxwell Caul Weeks in Treatment: 6 History of Present Illness Location: left heel Quality: Patient reports No Pain. Severity: Patient states wound are getting worse. Duration: Patient has had the wound for > 3 months prior to seeking treatment at the wound center Context: The wound appeared gradually over time Modifying Factors: Other treatment(s) tried include:local care with some silver and zinc paste HPI Description: 30 year old male who has a history of traumatic brain injury and also has a ulcer on his left heel which was noted by the family about 4-5 months ago. The TBI was in January 2013 and prior to this visit the patient has had hyperbaric oxygen therapy approximately 2 years after his  TBI. He is had several gastrostomy tube placed by the gastroenterologist but then finally had a surgical procedure to bring the gastric mucosa to the skin surface. This was done approximately 9 months ago. Past medical history of hepatitis C, traumatic brain injury, pulmonary emboli, ileostomy, tracheostomy, PEG tube placement. Was a former smoker and quit in January 2013 09/05/15; this is a 30 year old man who has a history of traumatic brain injury. He has fed through a PEG tube. He has a pressure ulcer on the left heel which he had for many months. Currently they've been using Santyl to the heel wound which had a surface eschar. There are returning today in follow-up 09/12/15; I saw this patient last week for the first time. He has a pressure ulcer on the left heel which she had had for many months. He was using Santyl and didn't have any product. We divided the wound and changed him to Silver collagen which apparently arrived at his house the next day or/21/17; the patient's wound is roughly half the size of last week currently at 0.6 x 0.5 x 0.1. We have been using Silver collagen for the last 2 weeks. He arrives accompanied by a caregiver. There are no new issues reported Electronic Signature(s) Signed: 09/20/2015 8:05:15 AM By: Baltazar Najjar MD Entered By: Baltazar Najjar on 09/19/2015 13:15:34 [name] [name] ([id number]) -------------------------------------------------------------------------------- Physical Exam Details Patient Name: [name] [name] Date of Service: 09/19/2015 12:45 PM Medical Record Number: [id number] Patient Account Number: [id number] Date of Birth/Sex: 1986-02-13 (29 y.[name]. Male) Treating RN: Clover Mealy, RN, BSN, [place] Sink Primary Care Physician: Bethann Punches Other Clinician: Referring Physician: Bethann Punches Treating Physician/Extender: Maxwell Caul Weeks in Treatment: 6 Constitutional Sitting or standing Blood Pressure is within target range for patient.. Pulse regular  and within target range for patient.Marland Kitchen Respirations regular, non-labored and within target range.. Temperature is normal and within the target range  for the patient.. Cardiovascular Pedal pulses palpable and strong bilaterally.. Extremities are free of varicosities, clubbing or edema. Peripheral pulses strong and equal. Capillary refill < 3 seconds.. Notes Wound exam; the wound is on the Achilles area of the left heel. No debridement was again required. There are rims of epithelialization. The center of the wound may have some surface slough however I did not provide this today and probably won't [name] long [name] the continued improvement in wound dimensions happen. There is no evidence of infection or ischemia Electronic Signature(s) Signed: 09/20/2015 8:05:15 AM By: Baltazar Najjar MD Entered By: Baltazar Najjar on 09/19/2015 13:16:41 [name] [name] ([id number]) -------------------------------------------------------------------------------- Physician Orders Details Patient Name: [name] [name] Date of Service: 09/19/2015 12:45 PM Medical Record Number: [id number] Patient Account Number: [id number] Date of Birth/Sex: 1986-06-10 (29 y.[name]. Male) Treating RN: Clover Mealy, RN, BSN, [place] Sink Primary Care Physician: Bethann Punches Other Clinician: Referring Physician: Bethann Punches Treating Physician/Extender: Altamese [place] in Treatment: 6 Verbal / Phone Orders: Yes Clinician: Afful, RN, BSN, Rita Read Back and Verified: Yes Diagnosis Coding Wound Cleansing Wound #1 Left Calcaneus [name] Clean wound with Normal Saline. Anesthetic Wound #1 Left Calcaneus [name] Topical Lidocaine 4% cream applied to wound bed prior to debridement Skin Barriers/Peri-Wound Care Wound #1 Left Calcaneus [name] Skin Prep Primary Wound Dressing Wound #1 Left Calcaneus [name] Prisma Ag Secondary Dressing [name] Boardered Foam Dressing Dressing Change Frequency Wound #1 Left Calcaneus [name] Change dressing every other day. Follow-up  Appointments Wound #1 Left Calcaneus [name] Return Appointment in 1 week. Electronic Signature(s) Signed: 09/19/2015 5:12:37 PM By: Elpidio Eric BSN, RN Signed: 09/20/2015 8:05:15 AM By: Baltazar Najjar MD Entered By: Elpidio Eric on 09/19/2015 13:13:11 [name] [name] ([id number]) -------------------------------------------------------------------------------- Problem List Details Patient Name: [name] [name] Date of Service: 09/19/2015 12:45 PM Medical Record Number: [id number] Patient Account Number: [id number] Date of Birth/Sex: April 29, 1986 (29 y.[name]. Male) Treating RN: Clover Mealy, RN, BSN, [place] Sink Primary Care Physician: Bethann Punches Other Clinician: Referring Physician: Bethann Punches Treating Physician/Extender: Altamese [place] in Treatment: 6 Active Problems ICD-10 Encounter Code Description Active Date Diagnosis [phone] Pressure ulcer of left heel, stage 2 08/08/2015 Yes Z87.820 Personal history of traumatic brain injury 08/08/2015 Yes Inactive Problems Resolved Problems Electronic Signature(s) Signed: 09/20/2015 8:05:15 AM By: Baltazar Najjar MD Entered By: Baltazar Najjar on 09/19/2015 13:14:02 [name] [name] ([id number]) -------------------------------------------------------------------------------- Progress Note Details Patient Name: [name] [name] Date of Service: 09/19/2015 12:45 PM Medical Record Number: [id number] Patient Account Number: [id number] Date of Birth/Sex: Jun 14, 1986 (29 y.[name]. Male) Treating RN: Clover Mealy, RN, BSN, [place] Sink Primary Care Physician: Bethann Punches Other Clinician: Referring Physician: Bethann Punches Treating Physician/Extender: Altamese  in Treatment: 6 Subjective Chief Complaint Information obtained from Caregiver Patient is at the clinic for treatment of an open pressure ulcer to his left heel which she's had for about 4-5 months History of Present Illness (HPI) The following HPI elements were documented for the patient's wound: Location: left  heel Quality: Patient reports No Pain. Severity: Patient states wound are getting worse. Duration: Patient has had the wound for > 3 months prior to seeking treatment at the wound center Context: The wound appeared gradually over time Modifying Factors: Other treatment(s) tried include:local care with some silver and zinc paste 30 year old male who has a history of traumatic brain injury and also has a ulcer on his left heel which was noted by the family about 4-5 months ago. The TBI was in January 2013 and prior to this visit the patient has had hyperbaric oxygen therapy  approximately 2 years after his TBI. He is had several gastrostomy tube placed by the gastroenterologist but then finally had a surgical procedure to bring the gastric mucosa to the skin surface. This was done approximately 9 months ago. Past medical history of hepatitis C, traumatic brain injury, pulmonary emboli, ileostomy, tracheostomy, PEG tube placement. Was a former smoker and quit in January 2013 09/05/15; this is a 30 year old man who has a history of traumatic brain injury. He has fed through a PEG tube. He has a pressure ulcer on the left heel which he had for many months. Currently they've been using Santyl to the heel wound which had a surface eschar. There are returning today in follow-up 09/12/15; I saw this patient last week for the first time. He has a pressure ulcer on the left heel which she had had for many months. He was using Santyl and didn't have any product. We divided the wound and changed him to Silver collagen which apparently arrived at his house the next day or/21/17; the patient's wound is roughly half the size of last week currently at 0.6 x 0.5 x 0.1. We have been using Silver collagen for the last 2 weeks. He arrives accompanied by a caregiver. There are no new issues reported [name], [name] Coder ([id number]) Objective Constitutional Sitting or standing Blood Pressure is within target range for  patient.. Pulse regular and within target range for patient.Marland Kitchen Respirations regular, non-labored and within target range.. Temperature is normal and within the target range for the patient.. Vitals Time Taken: 12:52 PM, Height: 72 in, Weight: 200 lbs, BMI: 27.1, Temperature: 98.2 F, Pulse: 64 bpm, Respiratory Rate: 19 breaths/min, Blood Pressure: 134/64 mmHg. Cardiovascular Pedal pulses palpable and strong bilaterally.. Extremities are free of varicosities, clubbing or edema. Peripheral pulses strong and equal. Capillary refill < 3 seconds.. General Notes: Wound exam; the wound is on the Achilles area of the left heel. No debridement was again required. There are rims of epithelialization. The center of the wound may have some surface slough however I did not provide this today and probably won't [name] long [name] the continued improvement in wound dimensions happen. There is no evidence of infection or ischemia Integumentary (Hair, Skin) Wound #1 status is Open. Original cause of wound was Gradually Appeared. The wound is located on the Left Calcaneus. The wound measures 0.8cm length x 0.5cm width x 0.2cm depth; 0.314cm^2 area and 0.063cm^3 volume. The wound is limited to skin breakdown. There is no tunneling or undermining noted. There is a large amount of serosanguineous drainage noted. The wound margin is distinct with the outline attached to the wound base. There is medium (34-66%) pink, pale granulation within the wound bed. There is a medium (34-66%) amount of necrotic tissue within the wound bed including Adherent Slough. The periwound skin appearance exhibited: Localized Edema, Moist. The periwound skin appearance did not exhibit: Callus, Crepitus, Excoriation, Fluctuance, Friable, Induration, Rash, Scarring, Dry/Scaly, Maceration, Atrophie Blanche, Cyanosis, Ecchymosis, Hemosiderin Staining, Mottled, Pallor, Rubor, Erythema. Periwound temperature was noted [name] No Abnormality. The periwound  has tenderness on palpation. Assessment Active Problems ICD-10 J47.829 - Pressure ulcer of left heel, stage 2 Z87.820 - Personal history of traumatic brain injury Atha, [name] Coder ([id number]) Plan Wound Cleansing: Wound #1 Left Calcaneus: Clean wound with Normal Saline. Anesthetic: Wound #1 Left Calcaneus: Topical Lidocaine 4% cream applied to wound bed prior to debridement Skin Barriers/Peri-Wound Care: Wound #1 Left Calcaneus: Skin Prep Primary Wound Dressing: Wound #1 Left Calcaneus: Prisma Ag Secondary Dressing: Boardered Foam  Dressing Dressing Change Frequency: Wound #1 Left Calcaneus: Change dressing every other day. Follow-up Appointments: Wound #1 Left Calcaneus: Return Appointment in 1 week. #1 we continue with Prisma with a covering foam dressing. This is being changed every second day #2 to have a funny boot in place for both heels. This will probably also work in terms of secondary prevention once this is healed Psychologist, prison and probation services) Signed: 09/20/2015 8:05:15 AM By: Baltazar Najjar MD Entered By: Baltazar Najjar on 09/19/2015 13:17:47 [name] [name] ([id number]) -------------------------------------------------------------------------------- SuperBill Details Patient Name: [name] [name] Date of Service: 09/19/2015 Medical Record Number: [id number] Patient Account Number: [id number] Date of Birth/Sex: 05-Jun-1986 (29 y.[name]. Male) Treating RN: Clover Mealy, RN, BSN, [place] Sink Primary Care Physician: Bethann Punches Other Clinician: Referring Physician: Bethann Punches Treating Physician/Extender: Maxwell Caul Weeks in Treatment: 6 Diagnosis Coding ICD-10 Codes Code Description [phone] Pressure ulcer of left heel, stage 2 Z87.820 Personal history of traumatic brain injury Facility Procedures CPT4 Code: [id number] Description: [id number] - WOUND CARE VISIT-LEV 2 EST PT Modifier: Quantity: 1 Physician Procedures CPT4 Code: 2952841 Description: 99212 - WC PHYS LEVEL 2 - EST PT  ICD-10 Description Diagnosis L89.622 Pressure ulcer of left heel, stage 2 Modifier: Quantity: 1 Electronic Signature(s) Signed: 09/20/2015 8:05:15 AM By: Baltazar Najjar MD Entered By: Baltazar Najjar on 09/19/2015 13:18:28

## 2015-09-21 ENCOUNTER — Ambulatory Visit: Payer: 59 | Admitting: Physical Therapy

## 2015-09-22 ENCOUNTER — Encounter: Payer: 59 | Admitting: Speech Pathology

## 2015-09-22 ENCOUNTER — Encounter: Payer: Self-pay | Admitting: Physical Therapy

## 2015-09-22 ENCOUNTER — Ambulatory Visit: Payer: Commercial Managed Care - HMO | Admitting: Physical Therapy

## 2015-09-22 DIAGNOSIS — Z9181 History of falling: Secondary | ICD-10-CM

## 2015-09-22 DIAGNOSIS — R471 Dysarthria and anarthria: Secondary | ICD-10-CM | POA: Diagnosis not present

## 2015-09-22 DIAGNOSIS — R531 Weakness: Secondary | ICD-10-CM

## 2015-09-22 DIAGNOSIS — Z7409 Other reduced mobility: Secondary | ICD-10-CM

## 2015-09-22 NOTE — Therapy (Signed)
[facility] MAIN [facility] SERVICES [address] [place], [place], [address] Phone: [phone]   Fax:  [phone]  Physical Therapy Treatment  Patient Details  Name: [name] [name] MRN: [id number] Date of Birth: 1986/07/02 Referring Provider: [name] MD  Encounter Date: 09/22/2015      PT End of Session - 09/22/15 1039    Visit Number 7   Number of Visits 25   Date for PT Re-Evaluation 11/16/15   PT Start Time 1005   PT Stop Time 1035   PT Time Calculation (min) 30 min   Activity Tolerance Patient limited by lethargy;Patient limited by fatigue   Behavior During Therapy Flat affect      Past Medical History  Diagnosis Date  . Hepatitis C   . Scoliosis   . TBI (traumatic brain injury) (HCC) 07/2011  . Subarachnoid hemorrhage (HCC)   . Pulmonary emboli (HCC)   . Hepatitis C   . Depression     Past Surgical History  Procedure Laterality Date  . Fracture surgery      left acetabulum  . Ileostomy    . Lung tube    . Ankle surgery Bilateral   . Tracheostomy    . Peg tube placement    . [place] gastrostomy  07/02/2014  . Ivc filter placement ([facility] hx)    . Brain surgery      There were no vitals filed for this visit.  Visit Diagnosis:  Weakness generalized  Risk for falls  Impaired mobility      Subjective Assessment - 09/22/15 1038    Subjective Patient very tired today and reports, "Please don't move my legs. they hurt so much" Caregiver reports that they were late due to electrical problems.   Patient is accompained by: Family member   Pertinent History personal factors affecting rehab: chronic condition, high fall risk; LLE hip heterotrophic osscification; impaired cognition, ranchos scale #4 with inappropriate behavior at times;    How long can you sit comfortably? na   How long can you stand comfortably? able to stand for a few seconds with max A; stands in stander for 45 min at a time 2x a day;   How long can you  walk comfortably? unable to ambulate at this time   Patient Stated Goals having leg pain, hoping to work on HEP and mobility; Improve mobility prior to stem cell surgery;    Currently in Pain? No/denies  no pain at rest;      TREATMENT: Patient was max A +2 for transferring wheelchair<>Nustep with hoyer lift requiring assistance for positioning feet; He was very fatigued today; Patient instructed in Nustep with RUE movement, level 1 x4 min with mod VCs to increase UE speed to >70 steps per minute. Patient fatigued after 1-2 min and required max VCs to redirect to task;  PT applied McConnell tape to patient's cervical spine to improve erect sitting posture. Applied to left levator scapulae with moderate pull. Educated patient and caregiver on safe tape wearing including to remove tape if patient complains of discomfort or if redness is noted. Patient able to demonstrate better head position for a few seconds but then becomes tired and returns to cervical flexion, right lateral flexion and left rotation;  Patient very fatigued today and required increased time with transfers, sitting posture and mobility;  He would benefit from additional skilled PT intervention to work on trunk position and UE head control;  PT Education - 09/22/15 1038    Education provided Yes   Education Details head position, posture, safety with taping;   Person(s) Educated Patient;Caregiver(s)   Methods Explanation;Demonstration;Verbal cues   Comprehension Verbalized understanding;Returned demonstration;Verbal cues required             PT Long Term Goals - 08/24/15 0909    PT LONG TERM GOAL #1   Title Caregivers will be independent in performing HEP with patient including PROM and AAROM exercise for increased joint flexibility by 11/16/15   Baseline Caregivers are doing some PROM but would benefit from education to improve all joint mobility;   Time 12   Period  Weeks   Status New   PT LONG TERM GOAL #2   Title Patient will be able to sit with 1 UE supported only with back unsupported keeping erect posture with supervision for ADLs such as dressing and bathing by 11/16/15   Baseline unable to sit unsupported without assistance;   Time 12   Period Weeks   Status New   PT LONG TERM GOAL #3   Title Patient will be mod A for initiating sit<>stand transfer with LRAD to improve functional mobility in the home by 11/16/15   Baseline requires max A to initiate standing but unable to stand without 2 person assist;   Time 12   Period Weeks   Status New   PT LONG TERM GOAL #4   Title Patient will be able to stand with 1 UE supported with CGA for at least 15 sec or more to improve standing tolerance for ADLs by 11/16/15   Baseline Patient unable to stand without heavy assistance; He is standing in stander for 45 min 2x a day but unable to stand with caregivers;    Time 12   Period Weeks   Status New               Plan - 09/22/15 1039    Clinical Impression Statement Patient very fatigued today; His eyes were almost closed a few times during treatment session and he required mod VCs for attention to task. Patient continues to have increased tone and discomfort in BLE with transfers. PT instructed patient in Nustep for RUE movement to increase cardiovascular health with mod VCs to increase speed to 70 steps per minute; PT also applied [place] taping to left levator scapulae for better erect head and left lateral flexion to improve head position; educated caregiver on safety with taping. He would benefit from additional skilled PT intervention to improve functional mobility and strength;    Pt will benefit from skilled therapeutic intervention in order to improve on the following deficits Abnormal gait;Decreased endurance;Decreased skin integrity;Hypomobility;Impaired tone;Decreased activity tolerance;Decreased knowledge of use of DME;Decreased  strength;Increased muscle spasms;Decreased mobility;Decreased balance;Decreased cognition;Impaired perceived functional ability;Decreased safety awareness;Decreased coordination   Rehab Potential Fair   Clinical Impairments Affecting Rehab Potential positive: good caregiver support; negative: chronic condition; Patient's current clinical presentation is unstable as he has impaired congnition, behavioral changes and mobility deficits that change regularly due to TBI presentation;    PT Frequency 2x / week   PT Duration 12 weeks   PT Treatment/Interventions ADLs/Self Care Home Management;Cryotherapy;Electrical Stimulation;Moist Heat;Balance training;Therapeutic exercise;Therapeutic activities;Functional mobility training;Gait training;DME Instruction;Neuromuscular re-education;Patient/family education;Taping;Energy conservation;Passive range of motion   PT Next Visit Plan work on bed mobility; advance sitting exercise, Nustep   PT Home Exercise Plan continue as given   Consulted and Agree with Plan of Care Patient;Family member/caregiver   Family Member  Consulted mom        Problem List Patient Active Problem List   Diagnosis Date Noted  . Colitis, Clostridium difficile 06/19/2015  . Hypoxia 05/25/2015  . Clostridium difficile infection 05/25/2015  . Mucus plugging of bronchi 05/25/2015  . Low grade fever 05/25/2015  . History of pulmonary embolism 05/25/2015  . OSA on CPAP 05/25/2015  . Pressure ulcer 05/25/2015  . Convulsions/seizures (HCC) 02/10/2015  . Aspiration pneumonia (HCC) 02/07/2015  . Encephalopathy, metabolic 02/07/2015  . Acute respiratory failure (HCC) 01/29/2015  . Skin irritation 11/09/2014  . TBI (traumatic brain injury) (HCC) 02/04/2012  . 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 Heterotopic ossification 02/04/2012    Trotter,Margaret PT, DPT 09/22/2015, 10:41 AM  [facility] MAIN [facility] SERVICES [address] [place],  [place], [address] Phone: [phone]   Fax:  [phone]  Name: [name] [name] MRN: [id number] Date of Birth: 1986-02-16

## 2015-09-25 ENCOUNTER — Ambulatory Visit: Payer: 59 | Admitting: Physical Therapy

## 2015-09-25 ENCOUNTER — Encounter: Payer: 59 | Admitting: Occupational Therapy

## 2015-09-25 ENCOUNTER — Encounter: Payer: 59 | Admitting: Speech Pathology

## 2015-09-26 ENCOUNTER — Encounter: Payer: Self-pay | Admitting: Physical Therapy

## 2015-09-26 ENCOUNTER — Ambulatory Visit (INDEPENDENT_AMBULATORY_CARE_PROVIDER_SITE_OTHER): Payer: 59 | Admitting: Pulmonary Disease

## 2015-09-26 ENCOUNTER — Ambulatory Visit: Payer: BLUE CROSS/BLUE SHIELD | Admitting: Pulmonary Disease

## 2015-09-26 ENCOUNTER — Encounter: Payer: Self-pay | Admitting: Pulmonary Disease

## 2015-09-26 ENCOUNTER — Ambulatory Visit: Payer: Commercial Managed Care - HMO | Admitting: Physical Therapy

## 2015-09-26 ENCOUNTER — Encounter: Payer: 59 | Admitting: Internal Medicine

## 2015-09-26 VITALS — BP 122/74 | HR 63 | Ht 71.0 in | Wt 212.0 lb

## 2015-09-26 DIAGNOSIS — R471 Dysarthria and anarthria: Secondary | ICD-10-CM | POA: Diagnosis not present

## 2015-09-26 DIAGNOSIS — J9809 Other diseases of bronchus, not elsewhere classified: Secondary | ICD-10-CM | POA: Diagnosis not present

## 2015-09-26 DIAGNOSIS — R531 Weakness: Secondary | ICD-10-CM

## 2015-09-26 DIAGNOSIS — Z9181 History of falling: Secondary | ICD-10-CM

## 2015-09-26 DIAGNOSIS — T17500A Unspecified foreign body in bronchus causing asphyxiation, initial encounter: Secondary | ICD-10-CM

## 2015-09-26 DIAGNOSIS — Z7409 Other reduced mobility: Secondary | ICD-10-CM

## 2015-09-26 DIAGNOSIS — R532 Functional quadriplegia: Secondary | ICD-10-CM | POA: Diagnosis not present

## 2015-09-26 DIAGNOSIS — L89622 Pressure ulcer of left heel, stage 2: Secondary | ICD-10-CM | POA: Diagnosis not present

## 2015-09-26 NOTE — Therapy (Signed)
[facility] MAIN [facility] SERVICES [address] [place], [place], [address] Phone: [phone]   Fax:  [phone]  Physical Therapy Treatment  Patient Details  Name: [name] [name] MRN: [id number] Date of Birth: [birth year] Referring Provider: [name] MD  Encounter Date: 09/26/2015      PT End of Session - 09/26/15 1101    Visit Number 8   Number of Visits 25   Date for PT Re-Evaluation 11/16/15   PT Start Time 0945   PT Stop Time 1040   PT Time Calculation (min) 55 min   Activity Tolerance Patient limited by fatigue;Patient limited by pain   Behavior During Therapy Flat affect      Past Medical History  Diagnosis Date  . Hepatitis C   . Scoliosis   . TBI (traumatic brain injury) (HCC) 07/2011  . Subarachnoid hemorrhage (HCC)   . Pulmonary emboli (HCC)   . Hepatitis C   . Depression     Past Surgical History  Procedure Laterality Date  . Fracture surgery      left acetabulum  . Ileostomy    . Lung tube    . Ankle surgery Bilateral   . Tracheostomy    . Peg tube placement    . [place]ay gastrostomy  07/02/2014  . Ivc filter placement ([facility] hx)    . Brain surgery      There were no vitals filed for this visit.  Visit Diagnosis:  Weakness generalized  Risk for falls  Impaired mobility      Subjective Assessment - 09/26/15 1100    Subjective Caregiver reports that patient tolerated the tape well at home. He was able to hold his head up better. She reports that his family does have blue kinesiotape; PT explained to caregiver that the kinesiotape would not be as effective due to its stretchy properties.   Patient is accompained by: Family member   Pertinent History personal factors affecting rehab: chronic condition, high fall risk; LLE hip heterotrophic osscification; impaired cognition, ranchos scale #4 with inappropriate behavior at times;    How long can you sit comfortably? na   How long can you stand comfortably?  able to stand for a few seconds with max A; stands in stander for 45 min at a time 2x a day;   How long can you walk comfortably? unable to ambulate at this time   Patient Stated Goals having leg pain, hoping to work on HEP and mobility; Improve mobility prior to stem cell surgery;    Currently in Pain? No/denies      TREATMENT: PT used hoyer lift to get patient from wheelchair<>mat table x2 reps;  Patient is max A +2 for positioning on mat table;  PT performed passive right upper trap stretch 15 sec hold x3 x2 sets; PT educated patient in rolling to left with positioning head to left rotation and utilizing RUE for initiating rolling;  In left sidelying: PT instructed patient in right hip elevation/depression with mod-max tactile cues x10 reps; Patient had difficulty initiating movement; PT instructed patient in RLE knee flexion/extension x5 reps AAROM, x2 reps AROM; Patient supine: Curl ups with fist bump x5 reps with mod VCs to increase diaphragmatic breathing to improve abdominal contraction and increased flexion;  PT instructed patient in rolling to right side with mod VCs to utilize RUE to reach for LUE to help pull LUE to right side and then mod A +2 for rolling to left; Once on left side  patient complains of increased pain and demonstrates increased flexor tone; PT instructed patient in BLE knee flexion/extension AAROM x5 reps;Also educated patient in left cervical lateral flexion, partial ROM x2 reps;  Patient is max A +2 for getting back in wheelchair from mat table using hoyer lift;  PT applied McConnell tape to left levator scapulae muscle to facilitate left lateral flexion and extension with moderate pull; Patient able to hold head better in wheelchair once tape applied with less flexion and less right lateral flexion;                           PT Education - 09/26/15 1101    Education provided Yes   Education Details head position, rolling, LE movement;    Person(s) Educated Patient   Methods Explanation;Demonstration;Verbal cues   Comprehension Verbalized understanding;Returned demonstration;Verbal cues required             PT Long Term Goals - 08/24/15 0909    PT LONG TERM GOAL #1   Title Caregivers will be independent in performing HEP with patient including PROM and AAROM exercise for increased joint flexibility by 11/16/15   Baseline Caregivers are doing some PROM but would benefit from education to improve all joint mobility;   Time 12   Period Weeks   Status New   PT LONG TERM GOAL #2   Title Patient will be able to sit with 1 UE supported only with back unsupported keeping erect posture with supervision for ADLs such as dressing and bathing by 11/16/15   Baseline unable to sit unsupported without assistance;   Time 12   Period Weeks   Status New   PT LONG TERM GOAL #3   Title Patient will be mod A for initiating sit<>stand transfer with LRAD to improve functional mobility in the home by 11/16/15   Baseline requires max A to initiate standing but unable to stand without 2 person assist;   Time 12   Period Weeks   Status New   PT LONG TERM GOAL #4   Title Patient will be able to stand with 1 UE supported with CGA for at least 15 sec or more to improve standing tolerance for ADLs by 11/16/15   Baseline Patient unable to stand without heavy assistance; He is standing in stander for 45 min 2x a day but unable to stand with caregivers;    Time 12   Period Weeks   Status New               Plan - 09/26/15 1102    Clinical Impression Statement Patient continues to be limited by pain and often is distracted and hallucinates during PT session; PT focused on cervical ROM with stretches and mobility. PT re-applied Mcconell tape to improve head position while sitting. Patient able to demostrate improved cervical extension, left side bending . PT also instructed patient in LE movement/stretches. He requires increased time to  initiate and complete tasks. Patient would benefit from additional skilled PT intervention to improve functional mobility.    Pt will benefit from skilled therapeutic intervention in order to improve on the following deficits Abnormal gait;Decreased endurance;Decreased skin integrity;Hypomobility;Impaired tone;Decreased activity tolerance;Decreased knowledge of use of DME;Decreased strength;Increased muscle spasms;Decreased mobility;Decreased balance;Decreased cognition;Impaired perceived functional ability;Decreased safety awareness;Decreased coordination   Rehab Potential Fair   Clinical Impairments Affecting Rehab Potential positive: good caregiver support; negative: chronic condition; Patient's current clinical presentation is unstable as he has impaired congnition, behavioral  changes and mobility deficits that change regularly due to TBI presentation;    PT Frequency 2x / week   PT Duration 12 weeks   PT Treatment/Interventions ADLs/Self Care Home Management;Cryotherapy;Electrical Stimulation;Moist Heat;Balance training;Therapeutic exercise;Therapeutic activities;Functional mobility training;Gait training;DME Instruction;Neuromuscular re-education;Patient/family education;Taping;Energy conservation;Passive range of motion   PT Next Visit Plan work on bed mobility; advance sitting exercise, Nustep   PT Home Exercise Plan continue as given   Consulted and Agree with Plan of Care Patient;Family member/caregiver   Family Member Consulted mom        Problem List Patient Active Problem List   Diagnosis Date Noted  . Colitis, Clostridium difficile 06/19/2015  . Hypoxia 05/25/2015  . Clostridium difficile infection 05/25/2015  . Mucus plugging of bronchi 05/25/2015  . Low grade fever 05/25/2015  . History of pulmonary embolism 05/25/2015  . OSA on CPAP 05/25/2015  . Pressure ulcer 05/25/2015  . Convulsions/seizures (HCC) 02/10/2015  . Aspiration pneumonia (HCC) 02/07/2015  . Encephalopathy,  metabolic 02/07/2015  . Acute respiratory failure (HCC) 01/29/2015  . Skin irritation 11/09/2014  . TBI (traumatic brain injury) (HCC) 02/04/2012  . 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 Heterotopic ossification 02/04/2012    Barrett Goldie PT, DPT 09/26/2015, 11:05 AM  [facility] MAIN [facility] SERVICES [address] [place], [place], [address] Phone: [phone]   Fax:  [phone]  Name: [name] [name] MRN: [id number] Date of Birth: 11/15/1985

## 2015-09-27 NOTE — Progress Notes (Signed)
HERSON, PRICHARD ([id number]) Visit Report for 09/26/2015 Chief Complaint Document Details Patient Name: [name] [name], [name] [name] Date of Service: 09/26/2015 12:45 PM Medical Record Number: [id number] Patient Account Number: [id number] Date of Birth/Sex: [birth year] (30 y.o. Male) Treating RN: Clover Mealy, RN, BSN, [place] Sink Primary Care Physician: Bethann Punches Other Clinician: Referring Physician: Bethann Punches Treating Physician/Extender: Altamese Biehle in Treatment: 7 Information Obtained from: Caregiver Chief Complaint Patient is at the clinic for treatment of an open pressure ulcer to his left heel which she's had for about 4-5 months Electronic Signature(s) Signed: 09/27/2015 8:09:37 AM By: Baltazar Najjar MD Entered By: Baltazar Najjar on 09/26/2015 13:13:48 [name] [name] ([id number]) -------------------------------------------------------------------------------- HPI Details Patient Name: [name] [name] Date of Service: 09/26/2015 12:45 PM Medical Record Number: [id number] Patient Account Number: [id number] Date of Birth/Sex: Dec 11, 1985 (30 y.o. Male) Treating RN: Clover Mealy, RN, BSN, [place] Sink Primary Care Physician: Bethann Punches Other Clinician: Referring Physician: Bethann Punches Treating Physician/Extender: Maxwell Caul Weeks in Treatment: 7 History of Present Illness Location: left heel Quality: Patient reports No Pain. Severity: Patient states wound are getting worse. Duration: Patient has had the wound for > 3 months prior to seeking treatment at the wound center Context: The wound appeared gradually over time Modifying Factors: Other treatment(s) tried include:local care with some silver and zinc paste HPI Description: 30 year old male who has a history of traumatic brain injury and also has a ulcer on his left heel which was noted by the family about 4-5 months ago. The TBI was in January 2013 and prior to this visit the patient has had hyperbaric oxygen therapy approximately 2 years after his  TBI. He is had several gastrostomy tube placed by the gastroenterologist but then finally had a surgical procedure to bring the gastric mucosa to the skin surface. This was done approximately 9 months ago. Past medical history of hepatitis C, traumatic brain injury, pulmonary emboli, ileostomy, tracheostomy, PEG tube placement. Was a former smoker and quit in January 2013 09/05/15; this is a 30 year old man who has a history of traumatic brain injury. He has fed through a PEG tube. He has a pressure ulcer on the left heel which he had for many months. Currently they've been using Santyl to the heel wound which had a surface eschar. There are returning today in follow-up 09/12/15; I saw this patient last week for the first time. He has a pressure ulcer on the left heel which she had had for many months. He was using Santyl and didn't have any product. We divided the wound and changed him to Silver collagen which apparently arrived at his house the next day or/21/17; the patient's wound is roughly half the size of last week currently at 0.6 x 0.5 x 0.1. We have been using Silver collagen for the last 2 weeks. He arrives accompanied by a caregiver. There are no new issues reported 09/26/15; he continues to have improvement over the pressure ulcer on the left Achilles area of his left heel. This is a healthy-looking wound no surface debridement is needed. We'll use Santyl but changed to Silver collagen. Seems to be making good progress and there is epithelialization without evidence of infection all pressure off loading issues have been dealt with at home Electronic Signature(s) Signed: 09/27/2015 8:09:37 AM By: Baltazar Najjar MD Entered By: Baltazar Najjar on 09/26/2015 13:14:49 [name] [name] ([id number]) -------------------------------------------------------------------------------- Physical Exam Details Patient Name: [name] [name] Date of Service: 09/26/2015 12:45 PM Medical Record Number:  [id number] Patient Account Number: [id number] Date of Birth/Sex: April 23, 1986 (30 y.o.  Male) Treating RN: Afful, RN, BSN, [place] Sink Primary Care Physician: Bethann Punches Other Clinician: Referring Physician: Bethann Punches Treating Physician/Extender: Altamese [place] in Treatment: 7 Constitutional Sitting or standing Blood Pressure is within target range for patient.. Pulse regular and within target range for patient.Marland Kitchen Respirations regular, non-labored and within target range.. Temperature is normal and within the target range for the patient.. Cardiovascular Pedal pulses palpable and strong bilaterally.. Notes Wound exam; the wound is on the Achilles area of the left heel. No debridement was required. There is evidence of advancing epithelialization. No debridement is required. Peripheral pulses are palpable. No evidence of infection Electronic Signature(s) Signed: 09/27/2015 8:09:37 AM By: Baltazar Najjar MD Entered By: Baltazar Najjar on 09/26/2015 13:16:02 [name] [name] ([id number]) -------------------------------------------------------------------------------- Physician Orders Details Patient Name: [name] [name] Date of Service: 09/26/2015 12:45 PM Medical Record Number: [id number] Patient Account Number: [id number] Date of Birth/Sex: [birth year] (30 y.o. Male) Treating RN: Clover Mealy, RN, BSN, [place] Sink Primary Care Physician: Bethann Punches Other Clinician: Referring Physician: Bethann Punches Treating Physician/Extender: Altamese [place] in Treatment: 7 Verbal / Phone Orders: Yes Clinician: Afful, RN, BSN, Rita Read Back and Verified: Yes Diagnosis Coding ICD-10 Coding Code Description [phone] Pressure ulcer of left heel, stage 2 Z87.820 Personal history of traumatic brain injury Wound Cleansing Wound #1 Left Calcaneus o Clean wound with Normal Saline. Anesthetic Wound #1 Left Calcaneus o Topical Lidocaine 4% cream applied to wound bed prior to debridement Skin  Barriers/Peri-Wound Care Wound #1 Left Calcaneus o Skin Prep Primary Wound Dressing Wound #1 Left Calcaneus o Prisma Ag Secondary Dressing o Boardered Foam Dressing Dressing Change Frequency Wound #1 Left Calcaneus o Change dressing every other day. Follow-up Appointments Wound #1 Left Calcaneus o Return Appointment in 2 weeks. Off-Loading Wound #1 Left Calcaneus o Heel suspension boot to: - Sage boots in applied . Patient's own SWAYZE, PRIES ([id number]) Electronic Signature(s) Signed: 09/26/2015 4:37:25 PM By: Elpidio Eric BSN, RN Signed: 09/27/2015 8:09:37 AM By: Baltazar Najjar MD Entered By: Elpidio Eric on 09/26/2015 13:15:34 [name] [name] ([id number]) -------------------------------------------------------------------------------- Problem List Details Patient Name: [name] [name] Date of Service: 09/26/2015 12:45 PM Medical Record Number: [id number] Patient Account Number: [id number] Date of Birth/Sex: [birth year] (29 y.o. Male) Treating RN: Clover Mealy, RN, BSN, [place] Sink Primary Care Physician: Bethann Punches Other Clinician: Referring Physician: Bethann Punches Treating Physician/Extender: Altamese [place] in Treatment: 7 Active Problems ICD-10 Encounter Code Description Active Date Diagnosis [phone] Pressure ulcer of left heel, stage 2 08/08/2015 Yes Z87.820 Personal history of traumatic brain injury 08/08/2015 Yes Inactive Problems Resolved Problems Electronic Signature(s) Signed: 09/27/2015 8:09:37 AM By: Baltazar Najjar MD Entered By: Baltazar Najjar on 09/26/2015 13:13:15 [name] [name] ([id number]) -------------------------------------------------------------------------------- Progress Note Details Patient Name: [name] [name] Date of Service: 09/26/2015 12:45 PM Medical Record Number: [id number] Patient Account Number: [id number] Date of Birth/Sex: 1986-05-15 (29 y.o. Male) Treating RN: Clover Mealy, RN, BSN, [place] Sink Primary Care Physician: Bethann Punches Other  Clinician: Referring Physician: Bethann Punches Treating Physician/Extender: Altamese [place] in Treatment: 7 Subjective Chief Complaint Information obtained from Caregiver Patient is at the clinic for treatment of an open pressure ulcer to his left heel which she's had for about 4-5 months History of Present Illness (HPI) The following HPI elements were documented for the patient's wound: Location: left heel Quality: Patient reports No Pain. Severity: Patient states wound are getting worse. Duration: Patient has had the wound for > 3 months prior to seeking treatment at the wound center Context: The wound appeared gradually over time Modifying Factors:  Other treatment(s) tried include:local care with some silver and zinc paste 30 year old male who has a history of traumatic brain injury and also has a ulcer on his left heel which was noted by the family about 4-5 months ago. The TBI was in January 2013 and prior to this visit the patient has had hyperbaric oxygen therapy approximately 2 years after his TBI. He is had several gastrostomy tube placed by the gastroenterologist but then finally had a surgical procedure to bring the gastric mucosa to the skin surface. This was done approximately 9 months ago. Past medical history of hepatitis C, traumatic brain injury, pulmonary emboli, ileostomy, tracheostomy, PEG tube placement. Was a former smoker and quit in January 2013 09/05/15; this is a 30 year old man who has a history of traumatic brain injury. He has fed through a PEG tube. He has a pressure ulcer on the left heel which he had for many months. Currently they've been using Santyl to the heel wound which had a surface eschar. There are returning today in follow-up 09/12/15; I saw this patient last week for the first time. He has a pressure ulcer on the left heel which she had had for many months. He was using Santyl and didn't have any product. We divided the wound and changed him  to Silver collagen which apparently arrived at his house the next day or/21/17; the patient's wound is roughly half the size of last week currently at 0.6 x 0.5 x 0.1. We have been using Silver collagen for the last 2 weeks. He arrives accompanied by a caregiver. There are no new issues reported 09/26/15; he continues to have improvement over the pressure ulcer on the left Achilles area of his left heel. This is a healthy-looking wound no surface debridement is needed. We'll use Santyl but changed to Silver collagen. Seems to be making good progress and there is epithelialization without evidence of infection all pressure off loading issues have been dealt with at home [facility], Earna Coder ([id number]) Objective Constitutional Sitting or standing Blood Pressure is within target range for patient.. Pulse regular and within target range for patient.Marland Kitchen Respirations regular, non-labored and within target range.. Temperature is normal and within the target range for the patient.. Vitals Time Taken: 12:55 PM, Height: 72 in, Weight: 200 lbs, BMI: 27.1, Temperature: 98.6 F, Pulse: 63 bpm, Respiratory Rate: 18 breaths/min, Blood Pressure: 122/56 mmHg. Cardiovascular Pedal pulses palpable and strong bilaterally.. General Notes: Wound exam; the wound is on the Achilles area of the left heel. No debridement was required. There is evidence of advancing epithelialization. No debridement is required. Peripheral pulses are palpable. No evidence of infection Integumentary (Hair, Skin) Wound #1 status is Open. Original cause of wound was Gradually Appeared. The wound is located on the Left Calcaneus. The wound measures 0.5cm length x 0.5cm width x 0.1cm depth; 0.196cm^2 area and 0.02cm^3 volume. The wound is limited to skin breakdown. There is no tunneling or undermining noted. There is a small amount of serosanguineous drainage noted. The wound margin is distinct with the outline attached to the wound base. There  is medium (34-66%) pink, pale granulation within the wound bed. There is no necrotic tissue within the wound bed. The periwound skin appearance exhibited: Localized Edema, Moist. The periwound skin appearance did not exhibit: Callus, Crepitus, Excoriation, Fluctuance, Friable, Induration, Rash, Scarring, Dry/Scaly, Maceration, Atrophie Blanche, Cyanosis, Ecchymosis, Hemosiderin Staining, Mottled, Pallor, Rubor, Erythema. Periwound temperature was noted as No Abnormality. The periwound has tenderness on palpation. Assessment Active Problems ICD-10 W09.811 -  Pressure ulcer of left heel, stage 2 Z87.820 - Personal history of traumatic brain injury Dispenza, Earna Coder ([id number]) Plan Wound Cleansing: Wound #1 Left Calcaneus: Clean wound with Normal Saline. Anesthetic: Wound #1 Left Calcaneus: Topical Lidocaine 4% cream applied to wound bed prior to debridement Skin Barriers/Peri-Wound Care: Wound #1 Left Calcaneus: Skin Prep Primary Wound Dressing: Wound #1 Left Calcaneus: Prisma Ag Secondary Dressing: Boardered Foam Dressing Dressing Change Frequency: Wound #1 Left Calcaneus: Change dressing every other day. Follow-up Appointments: Wound #1 Left Calcaneus: Return Appointment in 2 weeks. Off-Loading: Wound #1 Left Calcaneus: Heel suspension boot to: - Sage boots in applied . Patient's own #1 we will continue with the Prisma, heel cup based dressings #2 we can see him back in 2 weeks, earlier if required Electronic Signature(s) Signed: 09/27/2015 8:09:37 AM By: Baltazar Najjar MD Entered By: Baltazar Najjar on 09/26/2015 13:16:56 [name] [name] ([id number]) -------------------------------------------------------------------------------- SuperBill Details Patient Name: [name] [name] Date of Service: 09/26/2015 Medical Record Number: [id number] Patient Account Number: [id number] Date of Birth/Sex: 1986-03-04 (29 y.o. Male) Treating RN: Clover Mealy, RN, BSN, [place] Sink Primary Care Physician: Bethann Punches Other Clinician: Referring Physician: Bethann Punches Treating Physician/Extender: Maxwell Caul Weeks in Treatment: 7 Diagnosis Coding ICD-10 Codes Code Description [phone] Pressure ulcer of left heel, stage 2 Z87.820 Personal history of traumatic brain injury Facility Procedures CPT4 Code: [id number] Description: [phone] - WOUND CARE VISIT-LEV 2 EST PT Modifier: Quantity: 1 Physician Procedures CPT4 Code: [id number] Description: 99212 - WC PHYS LEVEL 2 - EST PT ICD-10 Description Diagnosis L89.622 Pressure ulcer of left heel, stage 2 Modifier: Quantity: 1 Electronic Signature(s) Signed: 09/27/2015 8:09:37 AM By: Baltazar Najjar MD Entered By: Baltazar Najjar on 09/26/2015 13:17:26
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
## 2015-09-28 ENCOUNTER — Encounter: Payer: 59 | Admitting: Speech Pathology

## 2015-09-29 ENCOUNTER — Encounter: Payer: Self-pay | Admitting: Physical Therapy

## 2015-09-29 ENCOUNTER — Ambulatory Visit: Payer: Commercial Managed Care - HMO | Attending: Internal Medicine | Admitting: Physical Therapy

## 2015-09-29 ENCOUNTER — Encounter: Payer: 59 | Admitting: Speech Pathology

## 2015-09-29 ENCOUNTER — Ambulatory Visit: Payer: Commercial Managed Care - HMO | Admitting: Occupational Therapy

## 2015-09-29 ENCOUNTER — Ambulatory Visit: Payer: 59 | Admitting: Physical Therapy

## 2015-09-29 DIAGNOSIS — R460 Very low level of personal hygiene: Secondary | ICD-10-CM

## 2015-09-29 DIAGNOSIS — R531 Weakness: Secondary | ICD-10-CM | POA: Insufficient documentation

## 2015-09-29 DIAGNOSIS — Z9181 History of falling: Secondary | ICD-10-CM | POA: Insufficient documentation

## 2015-09-29 DIAGNOSIS — Z7409 Other reduced mobility: Secondary | ICD-10-CM

## 2015-09-29 DIAGNOSIS — Z741 Need for assistance with personal care: Secondary | ICD-10-CM

## 2015-09-29 NOTE — Therapy (Signed)
[place] [facility] MAIN [facility] SERVICES [address] [place], [place], [address] Phone: [phone]   Fax:  [phone]  Occupational Therapy Treatment  Patient Details  Name: [name] [name] MRN: [id number] Date of Birth: 02-09-1986 Referring Provider: Lysbeth [name]  Encounter Date: 09/29/2015      OT End of Session - 09/29/15 1109    Visit Number 5   Number of Visits 8   Date for OT Re-Evaluation 11/15/15   OT Start Time 1025   OT Stop Time 1100   OT Time Calculation (min) 35 min   Activity Tolerance Patient tolerated treatment well;Treatment limited secondary to agitation   Behavior During Therapy Agitated;Anxious      Past Medical History  Diagnosis Date  . Hepatitis C   . Scoliosis   . TBI (traumatic brain injury) (HCC) 07/2011  . Subarachnoid hemorrhage (HCC)   . Pulmonary emboli (HCC)   . Hepatitis C   . Depression     Past Surgical History  Procedure Laterality Date  . Fracture surgery      left acetabulum  . Ileostomy    . Lung tube    . Ankle surgery Bilateral   . Tracheostomy    . Peg tube placement    . Janeway gastrostomy  07/02/2014  . Ivc filter placement ([facility] hx)    . Brain surgery      There were no vitals filed for this visit.  Visit Diagnosis:  Weakness generalized  Self-care deficit for dressing and grooming      Subjective Assessment - 09/29/15 1104    Subjective  Pt. has reducung baclafen in preparation for surgery   Patient is accompained by: Family member   Pertinent History Pt. suffered a TBI   Patient Stated Goals Patient wants to be able to do more for himself, use his phone better.     Currently in Pain? Yes   Pain Score 0-No pain   Pain Location Leg   Pain Orientation Left;Right                      OT Treatments/Exercises (OP) - 09/29/15 0001    ADLs   ADL Comments Pt. performed hand-to-face patterns/self- grooming tasks including combing hair with support at the elbow.     Neurological Re-education Exercises   Other Exercises 1 Pt. performed AROM and AAROM for RUE. PROM to LUE in all joint ranges for shoulder flexion, abduction, elbow flexion, extension, forearm supination/pronation,  wrist flexion/extension, and digit MP,PIP, and DIP flexion and extension. Pt. Attempted to perform 1# dowel AAROM for shoulder flexion, and extension. Pt. requires extensive cues and encouragement for participation.                     OT Long Term Goals - 08/23/15 1628    OT LONG TERM GOAL #1   Title Patient will increase active motion in R UE sufficient to self feed with or with out assistive devices.   Baseline Unable to feed self any more.   Time 12   Period Weeks   Status New   OT LONG TERM GOAL #2   Title Patient will show improved upper extremity function to be able to use cell phone.   Baseline unable to use cell phone   Time 12   Period Weeks   Status New   OT LONG TERM GOAL #3   Title Patient will improve upper extremity function to be able to be able  to wash his face with or with out assistive devices.   Baseline Unable to wash face.   Time 12   Period Days   Status New   OT LONG TERM GOAL #4   Title Will improve R UE range of motion to be able to bruch teeth with or with out assistive devices.   Baseline Unable to brush teeth.   Time 12   Period Weeks   Status New   OT LONG TERM GOAL #5   Title Will be able to positioin L UE to prevent further contractures and skin break down.   Baseline Patient's hand is very tight and arm is stuck in synergy.   Time 12   Period Weeks   Status New               Plan - 09/29/15 1110    Clinical Impression Statement Pt. with intermittent attention span today with perserverating on only wanting to come to therapy one time a month. At times pt. Was talking to an additional person that did not exsist. Pt. with increased flexor tone in left UE and hand. Pt. requires in creased time to complete tasks with  right hand. Pt. has improved ROM during ADLs, and basic hand to mouth patterns with right hand.   Pt will benefit from skilled therapeutic intervention in order to improve on the following deficits (Retired) Decreased activity tolerance;Decreased coordination;Decreased endurance;Decreased range of motion;Decreased skin integrity;Decreased strength;Impaired UE functional use;Impaired tone   Clinical Impairments Affecting Rehab Potential Has an aide, TBI and co-morbidities, limitted ability to participate   OT Frequency 1x / week   OT Duration 8 weeks   OT Treatment/Interventions Self-care/ADL training;Neuromuscular education;Therapeutic exercise;Energy conservation;Manual Therapy;Therapeutic activities;Therapeutic exercises;Splinting;Passive range of motion;Patient/family education   Consulted and Agree with Plan of Care Family member/caregiver;Patient   Family Member Consulted cargiver        Problem List Patient Active Problem List   Diagnosis Date Noted  . Colitis, Clostridium difficile 06/19/2015  . Hypoxia 05/25/2015  . Clostridium difficile infection 05/25/2015  . Mucus plugging of bronchi 05/25/2015  . Low grade fever 05/25/2015  . History of pulmonary embolism 05/25/2015  . OSA on CPAP 05/25/2015  . Pressure ulcer 05/25/2015  . Convulsions/seizures (HCC) 02/10/2015  . Aspiration pneumonia (HCC) 02/07/2015  . Encephalopathy, metabolic 02/07/2015  . Acute respiratory failure (HCC) 01/29/2015  . Skin irritation 11/09/2014  . TBI (traumatic brain injury) (HCC) 02/04/2012  . 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 Heterotopic ossification 02/04/2012   [name], MS, OTR/L  [name]  [name] 09/29/2015, 3:06 PM  [place] [facility] [address] [place], [place], [address] Phone: [phone]   Fax:  [phone]  Name: [name] [name] MRN: [id number] Date of Birth: 08/04/1985

## 2015-09-29 NOTE — Therapy (Signed)
[place] [facility] MAIN [facility] SERVICES [address] [place], [place], [address] Phone: [phone]   Fax:  [phone]  Physical Therapy Treatment  Patient Details  Name: [name] [name] MRN: [id number] Date of Birth: [birth year] Referring Provider: [name] MD  Encounter Date: 09/29/2015      PT End of Session - 09/29/15 1033    Visit Number 9   Number of Visits 25   Date for PT Re-Evaluation 11/16/15   PT Start Time 0945   PT Stop Time 1025   PT Time Calculation (min) 40 min   Activity Tolerance Patient limited by fatigue;Patient limited by pain   Behavior During Therapy Agitated;Anxious      Past Medical History  Diagnosis Date  . Hepatitis C   . Scoliosis   . TBI (traumatic brain injury) (HCC) 07/2011  . Subarachnoid hemorrhage (HCC)   . Pulmonary emboli (HCC)   . Hepatitis C   . Depression     Past Surgical History  Procedure Laterality Date  . Fracture surgery      left acetabulum  . Ileostomy    . Lung tube    . Ankle surgery Bilateral   . Tracheostomy    . Peg tube placement    . Janeway gastrostomy  07/02/2014  . Ivc filter placement ([facility] hx)    . Brain surgery      There were no vitals filed for this visit.  Visit Diagnosis:  Weakness generalized  Risk for falls  Impaired mobility      Subjective Assessment - 09/29/15 1032    Subjective Patient late again today; Caregiver reports that his feeding tube came loose last night he needing extra care this morning to get cleaned up. Patient reports, "My legs are really hurting today" His caregiver reports that she gave him tylenol to help with the discomfort. Patient unable to rate pain. Caregiver reports that his doctor also decreased baclofen dosage as part of weening protocol.    Patient is accompained by: Family member   Pertinent History personal factors affecting rehab: chronic condition, high fall risk; LLE hip heterotrophic osscification; impaired cognition,  ranchos scale #4 with inappropriate behavior at times;    How long can you sit comfortably? na   How long can you stand comfortably? able to stand for a few seconds with max A; stands in stander for 45 min at a time 2x a day;   How long can you walk comfortably? unable to ambulate at this time   Patient Stated Goals having leg pain, hoping to work on HEP and mobility; Improve mobility prior to stem cell surgery;    Currently in Pain? Yes  unable to rate pain due to cognition; but grimaces with motion;        TREATMENT:    Patient was max A +2 for transferring wheelchair<>mat table with hoyer lift requiring assistance for positioning feet;  Sitting edge of mat table with mod-max A +1 for maintaining sitting posture and aide in front of patient assisting with treatment exercise: Sitting: RUE reaching for cones on right side and then trunk rotation for placing cones on left side with mod VCs for correct task technique and mod-max A for holding head up for better visual ability; Patient able to successfully transfer 5 cones from right to left; Stacking 5 cones on left side with RUE with mod VCs for attention to task; Patient exhibits decreased coordination having increased difficulty lining up last few cones;  Balloon  tap x10 reps with mod VCs for erect posture and to increase UE movement for better task completion; Patient continues to require max A for sitting balance with dynamic tasks; RUE reaching for SAEBO balls moving from bottom rung, to green rung to red and then yellow rung x4 balls each with RUE reaching; Patient required 1 short rest break due to fatigue with max VCs for erect posture; He also requires max tactile cues to increase UE reach and trunk movement initiation for better task ability;  Patient was then transferred back to wheelchair;  Matrix: RUE bicep curl 17.5# x10 reps with mod VCs to relax into full eccentric return for increased motor control; RUE tricep  press/shoulder extension 17.5# x5 reps with mod VCs to increase ROM and mod tactile cues for better hand placement;  Patient fatigued after treatment session; He was often distracted and exhibited increased fatigue during treatment session;                         PT Education - 09/29/15 1033    Education provided Yes   Education Details posture, sitting position, trunk exercise;   Person(s) Educated Patient   Methods Explanation;Verbal cues   Comprehension Verbalized understanding;Returned demonstration;Verbal cues required             PT Long Term Goals - 09/29/15 1036    PT LONG TERM GOAL #1   Title Caregivers will be independent in performing HEP with patient including PROM and AAROM exercise for increased joint flexibility by 11/16/15   Baseline Caregivers are doing some PROM but would benefit from education to improve all joint mobility;   Time 12   Period Weeks   Status On-going   PT LONG TERM GOAL #2   Title Patient will be able to sit with 1 UE supported only with back unsupported keeping erect posture with supervision for ADLs such as dressing and bathing by 11/16/15   Baseline unable to sit unsupported without assistance;   Time 12   Period Weeks   Status Partially Met   PT LONG TERM GOAL #3   Title Patient will be mod A for initiating sit<>stand transfer with LRAD to improve functional mobility in the home by 11/16/15   Baseline requires max A to initiate standing but unable to stand without 2 person assist;   Time 12   Period Weeks   Status Not Met   PT LONG TERM GOAL #4   Title Patient will be able to stand with 1 UE supported with CGA for at least 15 sec or more to improve standing tolerance for ADLs by 11/16/15   Baseline Patient unable to stand without heavy assistance; He is standing in stander for 45 min 2x a day but unable to stand with caregivers;    Time 12   Period Weeks   Status Not Met               Plan - 09/29/15 1034     Clinical Impression Statement Instructed patient in advanced postural strengthening exercise in sitting; Patient requires mod-max A for sitting balance but is able to initiate some erect posture with mod VCs. Patient fatigue quickly and returns to flexed posture with flexed head, right lateral bend and left rotation; Patient required increased cues for attention to task. He would benefit from additional skilled PT intervention to improve sitting balance, head position, functional mobility;    Pt will benefit from skilled therapeutic intervention in order  to improve on the following deficits Abnormal gait;Decreased endurance;Decreased skin integrity;Hypomobility;Impaired tone;Decreased activity tolerance;Decreased knowledge of use of DME;Decreased strength;Increased muscle spasms;Decreased mobility;Decreased balance;Decreased cognition;Impaired perceived functional ability;Decreased safety awareness;Decreased coordination   Rehab Potential Fair   Clinical Impairments Affecting Rehab Potential positive: good caregiver support; negative: chronic condition; Patient's current clinical presentation is unstable as he has impaired congnition, behavioral changes and mobility deficits that change regularly due to TBI presentation;    PT Frequency 2x / week   PT Duration 12 weeks   PT Treatment/Interventions ADLs/Self Care Home Management;Cryotherapy;Electrical Stimulation;Moist Heat;Balance training;Therapeutic exercise;Therapeutic activities;Functional mobility training;Gait training;DME Instruction;Neuromuscular re-education;Patient/family education;Taping;Energy conservation;Passive range of motion   PT Next Visit Plan work on bed mobility; advance sitting exercise, Nustep   PT Home Exercise Plan continue as given   Consulted and Agree with Plan of Care Patient;Family member/caregiver   Family Member Consulted mom          G-Codes - 18-Oct-2015 1036    Functional Assessment Tool Used Clinical judgement,  functional mobility, caregiver reports of home activity;    Functional Limitation Changing and maintaining body position   Changing and Maintaining Body Position Current Status [phone]) At least 80 percent but less than 100 percent impaired, limited or restricted   Changing and Maintaining Body Position Goal Status ([id number]) At least 40 percent but less than 60 percent impaired, limited or restricted      Problem List Patient Active Problem List   Diagnosis Date Noted  . Colitis, Clostridium difficile 06/19/2015  . Hypoxia 05/25/2015  . Clostridium difficile infection 05/25/2015  . Mucus plugging of bronchi 05/25/2015  . Low grade fever 05/25/2015  . History of pulmonary embolism 05/25/2015  . OSA on CPAP 05/25/2015  . Pressure ulcer 05/25/2015  . Convulsions/seizures ([place]) 02/10/2015  . Aspiration pneumonia ([place]) 02/07/2015  . Encephalopathy, metabolic 35/70/1779  . Acute respiratory failure ([place]) 01/29/2015  . Skin irritation 11/09/2014  . TBI (traumatic brain injury) ([place]) 02/04/2012  . Spastic tetraplegia ([place]) 02/04/2012  . Heterotopic ossification 02/04/2012    Shantal Roan PT, DPT 2015-10-18, 10:38 AM  [place] MAIN [facility] SERVICES [address] [place], [place], [address] Phone: [phone]   Fax:  [phone]  Name: [name] [name] MRN: [id number] Date of Birth: 01/09/1986

## 2015-10-02 ENCOUNTER — Encounter: Payer: 59 | Admitting: Occupational Therapy

## 2015-10-02 ENCOUNTER — Encounter: Payer: 59 | Admitting: Speech Pathology

## 2015-10-02 ENCOUNTER — Ambulatory Visit: Payer: 59 | Admitting: Physical Therapy

## 2015-10-03 ENCOUNTER — Encounter: Payer: 59 | Admitting: Speech Pathology

## 2015-10-03 ENCOUNTER — Ambulatory Visit: Payer: Commercial Managed Care - HMO | Admitting: Occupational Therapy

## 2015-10-03 ENCOUNTER — Ambulatory Visit: Payer: Commercial Managed Care - HMO | Admitting: Physical Therapy

## 2015-10-03 ENCOUNTER — Encounter: Payer: Self-pay | Admitting: Occupational Therapy

## 2015-10-03 DIAGNOSIS — Z7409 Other reduced mobility: Secondary | ICD-10-CM

## 2015-10-03 DIAGNOSIS — R531 Weakness: Secondary | ICD-10-CM | POA: Diagnosis not present

## 2015-10-03 DIAGNOSIS — Z741 Need for assistance with personal care: Secondary | ICD-10-CM

## 2015-10-03 DIAGNOSIS — Z9181 History of falling: Secondary | ICD-10-CM

## 2015-10-03 DIAGNOSIS — R460 Very low level of personal hygiene: Secondary | ICD-10-CM

## 2015-10-03 NOTE — Therapy (Signed)
[place] [facility] MAIN [facility] SERVICES [address] [place], [place], [address] Phone: [phone]   Fax:  [phone]  Occupational Therapy Treatment  Patient Details  Name: [name] [name] MRN: [id number] Date of Birth: 11-Dec-1985 Referring Provider: Lysbeth Penner  Encounter Date: 10/03/2015      OT End of Session - 10/03/15 1312    Visit Number 7   Number of Visits 8   Date for OT Re-Evaluation 11/15/15   Equipment Utilized During Treatment Reacher, Saebo    Activity Tolerance Patient tolerated treatment well;Treatment limited secondary to agitation   Behavior During Therapy [facility] for tasks assessed/performed      Past Medical History  Diagnosis Date  . Hepatitis C   . Scoliosis   . TBI (traumatic brain injury) (HCC) 07/2011  . Subarachnoid hemorrhage (HCC)   . Pulmonary emboli (HCC)   . Hepatitis C   . Depression     Past Surgical History  Procedure Laterality Date  . Fracture surgery      left acetabulum  . Ileostomy    . Lung tube    . Ankle surgery Bilateral   . Tracheostomy    . Peg tube placement    . Janeway gastrostomy  07/02/2014  . Ivc filter placement ([facility] hx)    . Brain surgery      There were no vitals filed for this visit.  Visit Diagnosis:  Weakness generalized      Subjective Assessment - 10/03/15 1109    Subjective  Pt. ccongestionaregiver reports pt. has nasal    Patient is accompained by: Family member   Pertinent History Pt. suffered a TBI   Patient Stated Goals Patient wants to be able to do more for himself, use his phone better.     Currently in Pain? Yes   Pain Type Chronic pain   Pain Relieving Factors Pain with left hand ROM was less today. Pt. was able to tolerate ROM without complaints                      OT Treatments/Exercises (OP) - 10/03/15 0001    ADLs   ADL Comments Pt. Attempted reacher for general retrieval of items from the tabletop. Pt. requires moderate cuing to complete, with  visual demonstration.     Neurological Re-education Exercises   Other Weight-Bearing Exercises 1 Pt. participated in PROM in all joint ranges for left shoulder flexion, abduction, adduction, elbow flexion, extension, forearm supination/pronation, wrist flexion, and extension. Pt. tolerated ROM proximally well, however has limited tolerance for ROM to the digits.                  OT Education - 10/03/15 1310    Education provided Yes   Education Details Pt. ed was provided about RUE ROM, reaching, and skin checks for left hand digits and palm to maintain and promote good skin integrity.             OT Long Term Goals - 08/23/15 1628    OT LONG TERM GOAL #1   Title Patient will increase active motion in R UE sufficient to self feed with or with out assistive devices.   Baseline Unable to feed self any more.   Time 12   Period Weeks   Status New   OT LONG TERM GOAL #2   Title Patient will show improved upper extremity function to be able to use cell phone.   Baseline unable to use cell phone  Time 12   Period Weeks   Status New   OT LONG TERM GOAL #3   Title Patient will improve upper extremity function to be able to be able to wash his face with or with out assistive devices.   Baseline Unable to wash face.   Time 12   Period Days   Status New   OT LONG TERM GOAL #4   Title Will improve R UE range of motion to be able to bruch teeth with or with out assistive devices.   Baseline Unable to brush teeth.   Time 12   Period Weeks   Status New   OT LONG TERM GOAL #5   Title Will be able to positioin L UE to prevent further contractures and skin break down.   Baseline Patient's hand is very tight and arm is stuck in synergy.   Time 12   Period Weeks   Status New               Plan - 10/03/15 1113    Clinical Impression Statement Pt. with good attention span throughout session today. Pt. required minimal cuing for redirection. Pt. does present with increased  flexor tone in the left  hand, pt. has a red/irritated area on the medial aspect of the thumb.  Pt. and caregiver were educated about ROM and  skin checks to maintain good skin integrity.   Pt will benefit from skilled therapeutic intervention in order to improve on the following deficits (Retired) Decreased activity tolerance;Decreased coordination;Decreased endurance;Decreased range of motion;Decreased skin integrity;Decreased strength;Impaired UE functional use;Impaired tone   Rehab Potential Fair   Clinical Impairments Affecting Rehab Potential Has an aide, TBI and co-morbidities, limitted ability to participate   OT Frequency 1x / week   OT Treatment/Interventions Self-care/ADL training;Neuromuscular education;Therapeutic exercise;Energy conservation;Manual Therapy;Therapeutic activities;Therapeutic exercises;Splinting;Passive range of motion;Patient/family education   Consulted and Agree with Plan of Care Family member/caregiver;Patient   Family Member Consulted cargiver        Problem List Patient Active Problem List   Diagnosis Date Noted  . Colitis, Clostridium difficile 06/19/2015  . Hypoxia 05/25/2015  . Clostridium difficile infection 05/25/2015  . Mucus plugging of bronchi 05/25/2015  . Low grade fever 05/25/2015  . History of pulmonary embolism 05/25/2015  . OSA on CPAP 05/25/2015  . Pressure ulcer 05/25/2015  . Convulsions/seizures (HCC) 02/10/2015  . Aspiration pneumonia (HCC) 02/07/2015  . Encephalopathy, metabolic 02/07/2015  . Acute respiratory failure (HCC) 01/29/2015  . Skin irritation 11/09/2014  . TBI (traumatic brain injury) (HCC) 02/04/2012  . 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 Heterotopic ossification 02/04/2012   [name] Ryin Ambrosius, MS, OTR/L  [name] Luisdaniel [place]on 10/03/2015, 1:36 PM  [facility] [address] [place], [place], [address] Phone: [phone]   Fax:  [phone]  Name: Fayrene [name]  Demeo MRN: [id number] Date of Birth: 06/17/1986

## 2015-10-04 ENCOUNTER — Emergency Department: Payer: 59

## 2015-10-04 ENCOUNTER — Encounter: Payer: Self-pay | Admitting: Physical Therapy

## 2015-10-04 ENCOUNTER — Ambulatory Visit: Payer: 59 | Admitting: Physical Therapy

## 2015-10-04 ENCOUNTER — Emergency Department
Admission: EM | Admit: 2015-10-04 | Discharge: 2015-10-05 | Disposition: A | Discharge status: Home / Self Care | Payer: 59 | Attending: Emergency Medicine | Admitting: Emergency Medicine

## 2015-10-04 ENCOUNTER — Encounter: Payer: Self-pay | Admitting: Emergency Medicine

## 2015-10-04 DIAGNOSIS — Z0000 Encounter for general adult medical examination without abnormal findings: Secondary | ICD-10-CM | POA: Insufficient documentation

## 2015-10-04 DIAGNOSIS — Z86711 Personal history of pulmonary embolism: Secondary | ICD-10-CM | POA: Diagnosis not present

## 2015-10-04 DIAGNOSIS — F329 Major depressive disorder, single episode, unspecified: Secondary | ICD-10-CM | POA: Insufficient documentation

## 2015-10-04 DIAGNOSIS — R0600 Dyspnea, unspecified: Secondary | ICD-10-CM | POA: Insufficient documentation

## 2015-10-04 LAB — URINALYSIS COMPLETE WITH MICROSCOPIC (ARMC ONLY)
Bacteria, UA: NONE SEEN
Bilirubin Urine: NEGATIVE
Glucose, UA: NEGATIVE mg/dL
HGB URINE DIPSTICK: NEGATIVE
KETONES UR: NEGATIVE mg/dL
LEUKOCYTES UA: NEGATIVE
NITRITE: NEGATIVE
PH: 8 (ref 5.0–8.0)
PROTEIN: NEGATIVE mg/dL
SPECIFIC GRAVITY, URINE: 1.008 (ref 1.005–1.030)

## 2015-10-04 LAB — CBC WITH DIFFERENTIAL/PLATELET
BASOS PCT: 1 %
Basophils Absolute: 0 10*3/uL (ref 0–0.1)
Eosinophils Absolute: 0.1 10*3/uL (ref 0–0.7)
Eosinophils Relative: 1 %
HCT: 42.1 % (ref 40.0–52.0)
HEMOGLOBIN: 13.8 g/dL (ref 13.0–18.0)
Lymphocytes Relative: 39 %
Lymphs Abs: 2.8 10*3/uL (ref 1.0–3.6)
MCH: 27.7 pg (ref 26.0–34.0)
MCHC: 32.7 g/dL (ref 32.0–36.0)
MCV: 84.7 fL (ref 80.0–100.0)
MONOS PCT: 8 %
Monocytes Absolute: 0.6 10*3/uL (ref 0.2–1.0)
NEUTROS ABS: 3.6 10*3/uL (ref 1.4–6.5)
NEUTROS PCT: 51 %
Platelets: 197 10*3/uL (ref 150–440)
RBC: 4.97 MIL/uL (ref 4.40–5.90)
RDW: 16.2 % — ABNORMAL HIGH (ref 11.5–14.5)
WBC: 7.1 10*3/uL (ref 3.8–10.6)

## 2015-10-04 LAB — BASIC METABOLIC PANEL
ANION GAP: 8 (ref 5–15)
BUN: 10 mg/dL (ref 6–20)
CALCIUM: 9.1 mg/dL (ref 8.9–10.3)
CHLORIDE: 106 mmol/L (ref 101–111)
CO2: 27 mmol/L (ref 22–32)
Creatinine, Ser: 0.63 mg/dL (ref 0.61–1.24)
GFR calc non Af Amer: >60 mL/min (ref >60)
Glucose, Bld: 83 mg/dL (ref 65–99)
Potassium: 4.3 mmol/L (ref 3.5–5.1)
Sodium: 141 mmol/L (ref 135–145)

## 2015-10-04 LAB — BRAIN NATRIURETIC PEPTIDE: B Natriuretic Peptide: 42 pg/mL (ref 0.0–100.0)

## 2015-10-04 LAB — TROPONIN I: Troponin I: 0.04 ng/mL — ABNORMAL HIGH (ref <0.031)

## 2015-10-04 NOTE — ED Provider Notes (Addendum)
[facility] Emergency Department Provider Note  ____________________________________________   I have reviewed the triage vital signs and the nursing notes.   HISTORY  Chief Complaint Respiratory Distress    HPI [name] [name] is a 30 y.o. male with a history of TBI depression remote history of PE on Eliquis , hepatitis C and scoliosis. Patient was called by caregiver consider getting low oxygen readings on their pulse ox. However when EMS arrived, they adjusted his device and he was having normal sats. Patient denies any shortness of breath or other complaints. Sats were 95 upon arrival. He states he feels just fine.    Past Medical History  Diagnosis Date  . Hepatitis C   . Scoliosis   . TBI (traumatic brain injury) (HCC) 07/2011  . Subarachnoid hemorrhage (HCC)   . Pulmonary emboli (HCC)   . Hepatitis C   . Depression     Patient Active Problem List   Diagnosis Date Noted  . Colitis, Clostridium difficile 06/19/2015  . Hypoxia 05/25/2015  . Clostridium difficile infection 05/25/2015  . Mucus plugging of bronchi 05/25/2015  . Low grade fever 05/25/2015  . History of pulmonary embolism 05/25/2015  . OSA on CPAP 05/25/2015  . Pressure ulcer 05/25/2015  . Convulsions/seizures (HCC) 02/10/2015  . Aspiration pneumonia (HCC) 02/07/2015  . Encephalopathy, metabolic 02/07/2015  . Acute respiratory failure (HCC) 01/29/2015  . Skin irritation 11/09/2014  . TBI (traumatic brain injury) (HCC) 02/04/2012  . 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 Fracture surgery      left acetabulum  . Ileostomy    . Lung tube    . Ankle surgery Bilateral   . Tracheostomy    . Peg tube placement    . Janeway gastrostomy  07/02/2014  . Ivc filter placement ([facility] hx)    . Brain surgery      Current Outpatient Rx  Name  Route  Sig  Dispense  Refill  . acidophilus  (RISAQUAD) CAPS capsule   Oral   Take 1 capsule by mouth 2 (two) times daily.   1 capsule   1   . albuterol (PROVENTIL) (2.5 MG/3ML) 0.083% nebulizer solution   Inhalation   Inhale 3 mLs into the lungs 3 (three) times daily.   120 mL   3   . apixaban (ELIQUIS) 5 MG TABS tablet   Oral   Take 5 mg by mouth 3 (three) times daily.          . Cranberry 1000 MG CAPS   Oral   Take 1,000 mg by mouth daily.          . fidaxomicin (DIFICID) 200 MG TABS tablet   Oral   Take 1 tablet (200 mg total) by mouth 2 (two) times daily.   20 tablet   0   . gabapentin (NEURONTIN) 300 MG capsule   Oral   Take 900 mg by mouth 3 (three) times daily.          . indomethacin (INDOCIN SR) 75 MG CR capsule   Oral   Take 75 mg by mouth daily.         Marland Kitchen lacosamide (VIMPAT) 200 MG TABS tablet   Oral   Take 200 mg by mouth 2 (two) times daily.         . Multiple Vitamins-Minerals (MULTIVITAMIN WITH MINERALS) tablet   Oral   Take 1 tablet by mouth  daily.         . Nutritional Supplements (FEEDING SUPPLEMENT, JEVITY 1.5 CAL/FIBER,) LIQD   Per Tube   Place 237 mLs into feeding tube 5 (five) times daily.         Marland Kitchen nystatin (MYCOSTATIN/NYSTOP) 100000 UNIT/GM POWD   Topical   Apply 1 g topically 2 (two) times daily.          Marland Kitchen OLANZapine (ZYPREXA) 15 MG tablet   Oral   Take 7.5 mg by mouth at bedtime.          Marland Kitchen oxyCODONE (ROXICODONE) 5 MG/5ML solution   Per Tube   Place 5 mLs (5 mg total) into feeding tube every 6 (six) hours as needed for breakthrough pain.   40 mL   0   . perphenazine (TRILAFON) 4 MG tablet   Oral   Take 4 mg by mouth 2 (two) times daily.         . propranolol ER (INDERAL LA) 60 MG 24 hr capsule   Oral   Take 1 capsule (60 mg total) by mouth daily.   30 capsule   0   . Respiratory Therapy Supplies (FLUTTER) DEVI      Use as directed   1 each   0   . risedronate (ACTONEL) 35 MG tablet   Oral   Take 35 mg by mouth every 7 (seven) days. with  water on empty stomach, nothing by mouth or lie down for next 30 minutes. Take on Saturday.         . Water For Irrigation, Sterile (FREE WATER) SOLN   Per Tube   Place 300 mLs into feeding tube every 6 (six) hours.   1000 mL   11     Allergies Ambien; Ativan; Depakote er; Dilaudid; and Keppra  Family History  Problem Relation Age of Onset  . Hypertension    . Hyperlipidemia Father   . Autoimmune disease Mother   . Cancer Maternal Grandmother   . Heart disease Maternal Grandfather     Social History Social History  Substance Use Topics  . Smoking status: Former Smoker    Quit date: 08/07/2011  . Smokeless tobacco: Never Used  . Alcohol Use: No    Review of Systems Constitutional: No fever/chills Eyes: No visual changes. ENT: No sore throat. No stiff neck no neck pain Cardiovascular: Denies chest pain. Respiratory: Denies shortness of breath. Gastrointestinal:   no vomiting.  No diarrhea.  No constipation. Genitourinary: Negative for dysuria. Musculoskeletal: Negative lower extremity swelling Skin: Negative for rash. Neurological: Negative for headaches, focal weakness or numbness. 10-point ROS otherwise negative.  ____________________________________________   PHYSICAL EXAM:  VITAL SIGNS: ED Triage Vitals  Enc Vitals Group     BP --      Pulse Rate 10/04/15 2034 59     Resp 10/04/15 2034 18     Temp 10/04/15 2034 98 F (36.7 C)     Temp Source 10/04/15 2034 Oral     SpO2 10/04/15 2034 95 %     Weight 10/04/15 2034 215 lb (97.523 kg)     Height 10/04/15 2034 6' (1.829 m)     Head Cir --      Peak Flow --      Pain Score 10/04/15 2038 0     Pain Loc --      Pain Edu? --      Excl. in GC? --     Constitutional: Alert and oriented. Baseline mentation per  EMS. Eyes: Conjunctivae are normal. PERRL. EOMI. Head: Atraumatic. Nose: No congestion/rhinnorhea. Mouth/Throat: Mucous membranes are moist.  Oropharynx non-erythematous. Neck: No stridor.    Nontender with no meningismus Cardiovascular: Normal rate, regular rhythm. Grossly normal heart sounds.  Good peripheral circulation. Respiratory: Normal respiratory effort.  No retractions. Lungs CTAB. Abdominal: Soft and nontender. No distention. No guarding no rebound Musculoskeletal: No lower extremity tenderness. No joint effusions, no DVT signs strong distal pulses no edema Neurologic: At baseline per EMS  Skin:  Skin is warm, dry and intact. No rash noted. Psychiatric: Mood and affect are normal. Speech and behavior are normal.  ____________________________________________   LABS (all labs ordered are listed, but only abnormal results are displayed)  Labs Reviewed  BASIC METABOLIC PANEL  CBC WITH DIFFERENTIAL/PLATELET   ____________________________________________  EKG  I personally interpreted any EKGs ordered by me or triage  Sinus rhythm rate 56 bpm no acute ST elevation or acute ST depression normal axis unremarkable EKG aside from mild bradycardia  ____________________________________________  RADIOLOGY  I reviewed any imaging ordered by me or triage that were performed during my shift and, if possible, patient and/or family made aware of any abnormal findings. ____________________________________________   PROCEDURES  Procedure(s) performed: None  Critical Care performed: None  ____________________________________________   INITIAL IMPRESSION / ASSESSMENT AND PLAN / ED COURSE  Pertinent labs & imaging results that were available during my care of the patient were reviewed by me and considered in my medical decision making (see chart for details).  Patient with no complaints with a reassuring pulse ox here, apparently there were some low readings at the place that he stays. We will check a chest x-ray, nothing at this time to suggest PE ACS dissection, he is at risk for pneumonia and will check that.   ----------------------------------------- 10:22 PM on  10/04/2015 -----------------------------------------  Other sometimes with documented pulse ox is in the low 90s here every time I go in the room if I verify that way for me is 97-100%. Her rate remained sinus in the 50s and low 60s sometimes in the 70s with normal variation in no acute ischemia or dysrhythmia detected on EKG. Patient states he is not sick and wants to go home. Chest x-ray is reassuring, lung exam to my exam is clear. Patient does have multiple histories of PE however, he is already have an IVC filter and is anticoagulated. Family states he's had "low-grade" fevers at home, although he is afebrile here. White count is 7.1. Short, patient looks well given his baseline however family is adamant that he should have a CT scan to rule out PE. I will check a urine because of the reported low-grade fevers although we see no evidence of acute infection and given history of recurrent pneumonias in the past rotated CT scan to make sure we rule out pneumonia or possible PE.  ----------------------------------------- 12:20 AM on 10/05/2015 -----------------------------------------  Patient himself is very adamant that he does not wish to be stuck for any further IVs. Patient has had 8 different CT scans of his chest since 2013 and has had multiple different CT since his diagnosis of PE and has not had a recurrent PE. He has normal oxygen saturations heart rates in the 60s and 70s there is no evidence of a acute PE he is anticoagulated, these oxygen saturations and we are getting are poor because he keeps moving his hand but every time he is in there with a good waveform, his sats are in the  mid 90s to 100 and I been watching him. Patient has no chest pain no shortness of breath. I did explain to the family that there would be difficult to know what to do even if we found a small subsegmental PE because patient already has an IVC filter and is anticoagulated. We further discussed the radiation burden.  With 8 different CT scans patient has had already in the last 3 years equal to 1600 chest x-rays, not including the CTs of the other parts of his body including head and abdomen. This is very significant radiation burden. Family states that they never thought about it like this. Patient refuses further IV attempts, family at this time prefer not to have a CT scan done. I feel that the patient understands of the risks and benefits and he adamantly refuses. He states that does not wish any more IV sticks and he adamantly does not wish a CT scan. Thus continuing with CT would be done against the expressed wishes of the patient is a compulsive area manner. Nonetheless, given his history of head injury and the fact that the family or legal guardians, of course this could be done if needed. In the family are aware that I am willing to do it if they insist. We may be able to obtainan ultrasound-guided IV if necessary but at this time, the mother and father prefer to defer it and would rather not have it done. I do not think this is unreasonable. There is obviously some that he may have a PE but again my suspicion is low, patient himself does not wish us to keep sticking needles for this and he is adamant that we stop, family at this time agrees to this and we will desist. Extensive return precautions and follow-up given and understood. He will come back to the emergency room for any new or worrisome symptoms, and they will follow closely with her primary care doctor tomorrow.  ____________________________________________   FINAL CLINICAL IMPRESSION(S) / ED DIAGNOSES  Final diagnoses:  None      This chart was dictated using voice recognition software.  Despite best efforts to proofread,  errors can occur which can change meaning.     Jeanmarie PlantJames A Samyuktha Brau, MD 10/04/15 2051  Jeanmarie PlantJames A Estill Llerena, MD 10/04/15 2224  Jeanmarie PlantJames A Arieliz Latino, MD 10/05/15 [id number]  Jeanmarie PlantJames A Navdeep Halt, MD 10/05/15 [phone]

## 2015-10-04 NOTE — ED Notes (Signed)
Per EMS, patient comes from home. Patient is a brain injury patient. His caregiver was concerned, stating the is SpO2 levels were dropping. Per EMS, patients oxygenation is good. EMS states the problem seems to stem from him being bradycardic at rest. Patient is A&O x4.

## 2015-10-04 NOTE — Therapy (Signed)
[place] MAIN [facility] SERVICES [address] [place], [place], [address] Phone: [phone]   Fax:  [phone]  Physical Therapy Treatment  Patient Details  Name: [name] [name] MRN: [id number] Date of Birth: 1986-03-22 Referring Provider: Rusty Aus MD  Encounter Date: 10/03/2015      PT End of Session - 10/04/15 1400    Visit Number 10   Number of Visits 25   Date for PT Re-Evaluation 11/16/15   PT Start Time 0930   PT Stop Time 1015   PT Time Calculation (min) 45 min   Activity Tolerance Patient limited by fatigue;Patient limited by pain   Behavior During Therapy Agitated;Restless      Past Medical History  Diagnosis Date  . Hepatitis C   . Scoliosis   . TBI (traumatic brain injury) ([place]) 07/2011  . Subarachnoid hemorrhage ([place])   . Pulmonary emboli ([place])   . Hepatitis C   . Depression     Past Surgical History  Procedure Laterality Date  . Fracture surgery      left acetabulum  . Ileostomy    . Lung tube    . Ankle surgery Bilateral   . Tracheostomy    . Peg tube placement    . Janeway gastrostomy  07/02/2014  . Ivc filter placement ([facility] hx)    . Brain surgery      There were no vitals filed for this visit.  Visit Diagnosis:  Weakness generalized  Self-care deficit for dressing and grooming  Risk for falls  Impaired mobility      Subjective Assessment - 10/04/15 1359    Subjective Patient was a little late to PT session today; His caregiver reports that he was having a difficult morning getting ready. Patient reports increased pain in BLE shins. No pain at rest, but unable to rate pain with movement. "My legs really hurt. Please don't move my legs"   Patient is accompained by: Family member   Pertinent History personal factors affecting rehab: chronic condition, high fall risk; LLE hip heterotrophic osscification; impaired cognition, ranchos scale #4 with inappropriate behavior at times;    How long can you  sit comfortably? na   How long can you stand comfortably? able to stand for a few seconds with max A; stands in stander for 45 min at a time 2x a day;   How long can you walk comfortably? unable to ambulate at this time   Patient Stated Goals having leg pain, hoping to work on HEP and mobility; Improve mobility prior to stem cell surgery;    Currently in Pain? Yes  unable to rate pain;       TREATMENT:    Patient was max A +2 for transferring wheelchair<>mat table with hoyer lift requiring assistance for positioning feet;  Sitting edge of mat table with mod A +1 for maintaining sitting posture and aide in front of patient assisting with treatment exercise: Sitting: RUE reaching for colored stones on right side and then trunk rotation for placing stones on left side x8, then picking them up on left and placing back on right side #8, with mod VCs for correct task technique and mod-max A for holding head up for better visual ability;  RUE wand trunk flexion/extension AAROM x5 reps working on pulling self up into sitting posture; Patient required min A for maintain sitting posture once pulled up into position;  RUE reaching for SAEBO balls moving from bottom rung, to green rung to  red and then yellow rung x4 balls each with RUE reaching; Patient required max VCs for erect posture; He also requires max tactile cues to increase UE reach and trunk movement initiation for better task ability; Seated unsupported with RUE holding position x10 sec x3 reps with cues to work on increasing erect posture and head position; Patient exhibits increased right trunk lean without assistance in sitting;  Patient max A for sitting to supine positioning; PT used "stick" to perform soft tissue/myofascial release along BLE anterior/posterior legs x8 min; Patient reports less pain/discomfort in BLE after stick tool used. He does continue to have tightness due to tone in legs after treatment;  Patient was then transferred  back to wheelchair;  Matrix: RUE D1 flexion/extension 7.5# x 5 reps with mod VCs to relax into full eccentric return for increased motor control; RUE chest press 7.5# with cues for serratus punch x10 reps; RUE tricep press/shoulder extension 7.5# x10 reps with mod VCs to increase ROM and mod tactile cues for better hand placement;  Patient fatigued after treatment session; He was often distracted and exhibited increased fatigue during treatment session;                             PT Education - 10/04/15 1359    Education provided Yes   Education Details sitting posture, positioning, Stick for LE tightness;    Person(s) Educated Patient   Methods Explanation;Verbal cues   Comprehension Verbalized understanding;Returned demonstration;Verbal cues required             PT Long Term Goals - 09/29/15 1036    PT LONG TERM GOAL #1   Title Caregivers will be independent in performing HEP with patient including PROM and AAROM exercise for increased joint flexibility by 11/16/15   Baseline Caregivers are doing some PROM but would benefit from education to improve all joint mobility;   Time 12   Period Weeks   Status On-going   PT LONG TERM GOAL #2   Title Patient will be able to sit with 1 UE supported only with back unsupported keeping erect posture with supervision for ADLs such as dressing and bathing by 11/16/15   Baseline unable to sit unsupported without assistance;   Time 12   Period Weeks   Status Partially Met   PT LONG TERM GOAL #3   Title Patient will be mod A for initiating sit<>stand transfer with LRAD to improve functional mobility in the home by 11/16/15   Baseline requires max A to initiate standing but unable to stand without 2 person assist;   Time 12   Period Weeks   Status Not Met   PT LONG TERM GOAL #4   Title Patient will be able to stand with 1 UE supported with CGA for at least 15 sec or more to improve standing tolerance for ADLs by  11/16/15   Baseline Patient unable to stand without heavy assistance; He is standing in stander for 45 min 2x a day but unable to stand with caregivers;    Time 12   Period Weeks   Status Not Met               Plan - 10/04/15 1716    Clinical Impression Statement Instucted patient in sitting balance exercise. Patient was able to demonstrate better sitting posture with cues and was able to initiate better trunk rotation with instruction; PT also performed myofascial release/soft tissue massage with "stick"  on BLE for less discomfort. Patient reports less soreness following Stick exercise. Patient was also able to advance with RUE strengthening using matrix. Patient would benefit from additional skilled PT intervention to improve trunk control and increase tolerance with movement for better functional mobility;    Pt will benefit from skilled therapeutic intervention in order to improve on the following deficits Abnormal gait;Decreased endurance;Decreased skin integrity;Hypomobility;Impaired tone;Decreased activity tolerance;Decreased knowledge of use of DME;Decreased strength;Increased muscle spasms;Decreased mobility;Decreased balance;Decreased cognition;Impaired perceived functional ability;Decreased safety awareness;Decreased coordination   Rehab Potential Fair   Clinical Impairments Affecting Rehab Potential positive: good caregiver support; negative: chronic condition; Patient's current clinical presentation is unstable as he has impaired congnition, behavioral changes and mobility deficits that change regularly due to TBI presentation;    PT Frequency 2x / week   PT Duration 12 weeks   PT Treatment/Interventions ADLs/Self Care Home Management;Cryotherapy;Electrical Stimulation;Moist Heat;Balance training;Therapeutic exercise;Therapeutic activities;Functional mobility training;Gait training;DME Instruction;Neuromuscular re-education;Patient/family education;Taping;Energy conservation;Passive  range of motion   PT Next Visit Plan work on bed mobility; advance sitting exercise, Nustep   PT Home Exercise Plan continue as given   Consulted and Agree with Plan of Care Patient;Family member/caregiver   Family Member Consulted mom        Problem List Patient Active Problem List   Diagnosis Date Noted  . Colitis, Clostridium difficile 06/19/2015  . Hypoxia 05/25/2015  . Clostridium difficile infection 05/25/2015  . Mucus plugging of bronchi 05/25/2015  . Low grade fever 05/25/2015  . History of pulmonary embolism 05/25/2015  . OSA on CPAP 05/25/2015  . Pressure ulcer 05/25/2015  . Convulsions/seizures ([place]) 02/10/2015  . Aspiration pneumonia ([place]) 02/07/2015  . Encephalopathy, metabolic 38/18/2993  . Acute respiratory failure ([place]) 01/29/2015  . Skin irritation 11/09/2014  . TBI (traumatic brain injury) ([place]) 02/04/2012  . Spastic tetraplegia ([place]) 02/04/2012  . 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

## 2015-10-05 ENCOUNTER — Ambulatory Visit (INDEPENDENT_AMBULATORY_CARE_PROVIDER_SITE_OTHER): Payer: 59 | Admitting: Adult Health

## 2015-10-05 ENCOUNTER — Ambulatory Visit: Payer: Commercial Managed Care - HMO | Admitting: Physical Therapy

## 2015-10-05 ENCOUNTER — Encounter: Payer: Self-pay | Admitting: Adult Health

## 2015-10-05 VITALS — BP 118/72 | HR 73 | Ht 71.0 in | Wt 214.0 lb

## 2015-10-05 DIAGNOSIS — J209 Acute bronchitis, unspecified: Secondary | ICD-10-CM

## 2015-10-05 LAB — TROPONIN I: Troponin I: <0.03 ng/mL (ref <0.031)

## 2015-10-05 MED ORDER — AZITHROMYCIN 250 MG PO TABS
ORAL_TABLET | ORAL | Status: AC
Start: 2015-10-05 — End: 2015-10-10

## 2015-10-05 NOTE — ED Notes (Signed)
Dr Alphonzo LemmingsMcShane in to speak with family

## 2015-10-05 NOTE — ED Notes (Addendum)
CT tech at bedside to flush right a/c SL; site does not flush well with multiple attempts; SL d/c'd cannula intact & dsng applied; attempted 20GA to right FA without success by B Wallace CullensGray, RN charge nurse; pt becoming agitated with attempts to locate, yelling & cursing, tensing arms; unable to locate any further access by these 3 RNs; Dr Alphonzo LemmingsMcShane notified

## 2015-10-05 NOTE — Patient Instructions (Addendum)
Zpack take as directed.  Continue with VEST therapy  Continue on Albuterol nebs Three times a day   Uses Oxygen 2l/m As needed  And At bedtime   Follow up with Dr. Sung AmabileSimonds as planned and As needed   Please contact office for sooner follow up if symptoms do not improve or worsen or seek emergency care

## 2015-10-05 NOTE — Discharge Instructions (Signed)
It was a pleasure to meet you. At this time, we are reassured by your findings. If [name] [name] feels worse in any way, or has any new or worrisome symptoms including fever, cough, shortness of breath, trouble breathing or you have any other concerns, we are happy to see him here again and encourage you to return.

## 2015-10-09 ENCOUNTER — Encounter: Payer: 59 | Admitting: Speech Pathology

## 2015-10-09 ENCOUNTER — Encounter: Payer: 59 | Admitting: Occupational Therapy

## 2015-10-09 ENCOUNTER — Ambulatory Visit: Payer: 59 | Admitting: Physical Therapy
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
## 2015-10-10 ENCOUNTER — Encounter: Payer: 59 | Attending: Surgery | Admitting: Internal Medicine

## 2015-10-10 ENCOUNTER — Ambulatory Visit: Payer: Commercial Managed Care - HMO | Admitting: Physical Therapy

## 2015-10-10 ENCOUNTER — Encounter: Payer: 59 | Admitting: Speech Pathology

## 2015-10-10 ENCOUNTER — Encounter: Payer: Self-pay | Admitting: Physical Therapy

## 2015-10-10 ENCOUNTER — Ambulatory Visit: Payer: Commercial Managed Care - HMO | Admitting: Occupational Therapy

## 2015-10-10 DIAGNOSIS — Z9181 History of falling: Secondary | ICD-10-CM

## 2015-10-10 DIAGNOSIS — Z8619 Personal history of other infectious and parasitic diseases: Secondary | ICD-10-CM | POA: Insufficient documentation

## 2015-10-10 DIAGNOSIS — Z7409 Other reduced mobility: Secondary | ICD-10-CM

## 2015-10-10 DIAGNOSIS — Z931 Gastrostomy status: Secondary | ICD-10-CM | POA: Insufficient documentation

## 2015-10-10 DIAGNOSIS — Z87891 Personal history of nicotine dependence: Secondary | ICD-10-CM | POA: Insufficient documentation

## 2015-10-10 DIAGNOSIS — R460 Very low level of personal hygiene: Secondary | ICD-10-CM

## 2015-10-10 DIAGNOSIS — Z932 Ileostomy status: Secondary | ICD-10-CM | POA: Insufficient documentation

## 2015-10-10 DIAGNOSIS — Z930 Tracheostomy status: Secondary | ICD-10-CM | POA: Insufficient documentation

## 2015-10-10 DIAGNOSIS — Z87820 Personal history of traumatic brain injury: Secondary | ICD-10-CM | POA: Diagnosis not present

## 2015-10-10 DIAGNOSIS — Z741 Need for assistance with personal care: Secondary | ICD-10-CM

## 2015-10-10 DIAGNOSIS — L89622 Pressure ulcer of left heel, stage 2: Secondary | ICD-10-CM | POA: Diagnosis present

## 2015-10-10 DIAGNOSIS — R531 Weakness: Secondary | ICD-10-CM

## 2015-10-10 DIAGNOSIS — J209 Acute bronchitis, unspecified: Secondary | ICD-10-CM | POA: Insufficient documentation

## 2015-10-10 NOTE — Therapy (Signed)
[place] MAIN [facility] SERVICES [address] [place], [place], [address] Phone: [phone]   Fax:  [phone]  Physical Therapy Treatment  Patient Details  Name: [name] [name] MRN: [id number] Date of Birth: 07-Aug-1985 Referring Provider: Rusty Aus MD  Encounter Date: 10/10/2015      PT End of Session - 10/10/15 1111    Visit Number 11   Number of Visits 25   Date for PT Re-Evaluation 11/16/15   PT Start Time 0931   PT Stop Time 1018   PT Time Calculation (min) 47 min   Activity Tolerance Patient limited by pain   Behavior During Therapy Agitated;Restless;Anxious      Past Medical History  Diagnosis Date  . Hepatitis C   . Scoliosis   . TBI (traumatic brain injury) ([place]) 07/2011  . Subarachnoid hemorrhage ([place])   . Pulmonary emboli ([place])   . Hepatitis C   . Depression     Past Surgical History  Procedure Laterality Date  . Fracture surgery      left acetabulum  . Ileostomy    . Lung tube    . Ankle surgery Bilateral   . Tracheostomy    . Peg tube placement    . Janeway gastrostomy  07/02/2014  . Ivc filter placement ([facility] hx)    . Brain surgery      There were no vitals filed for this visit.  Visit Diagnosis:  Weakness generalized  Self-care deficit for dressing and grooming  Risk for falls  Impaired mobility      Subjective Assessment - 10/10/15 1109    Subjective Patient reports, "Why do I have to come 2x a week. I only want to come every other week. Please don't make me get out of my chair" Caregiver reports that patient has been having increased tone in BLE over last few days as they ween him off baclofen. His mom found out that he was not approved for stem cell surgery after all so they are looking around for other options.    Patient is accompained by: Family member   Pertinent History personal factors affecting rehab: chronic condition, high fall risk; LLE hip heterotrophic osscification; impaired  cognition, ranchos scale #4 with inappropriate behavior at times;    How long can you sit comfortably? na   How long can you stand comfortably? able to stand for a few seconds with max A; stands in stander for 45 min at a time 2x a day;   How long can you walk comfortably? unable to ambulate at this time   Patient Stated Goals having leg pain, hoping to work on HEP and mobility; Improve mobility prior to stem cell surgery;    Currently in Pain? --  no pain at rest, but increased pain with movement, unable to rate       TREATMENT: Caregiver reports increased tightness with working on bed mobility/dressing for patient which was why he was late;  Patient was transferred to/from mat table using hoyer lift max A +2  PT performed PROM: Hooklying with pball under BLE: Lumbar trunk rotation x2 min with overpressure into end range for stretch; Hamstring stretch with ankle DF 15 sec hold x3 reps each LE; Double knee to chest 5 sec hold x5 reps;  PT attempted hip adduction stretch but patient unable to tolerate;   PT applied moist heat to BLE hip/knee x5 min and left heat on during PROM,  Hip abduction PROM of RLE for adduction  stretch 10 sec hold x3 reps; Passive RLE hip/knee flexion/extension x5 reps;  Patient reports less discomfort after treatment session; He required constant cues to relax during PROM and to improve head control for better neck stretch; Patient continues to be anxious and limited by tone and pain with all LE movement;                           PT Education - 10/10/15 1110    Education provided Yes   Education Details heat for less tightness;    Person(s) Educated Patient   Methods Explanation;Verbal cues   Comprehension Verbalized understanding;Returned demonstration;Verbal cues required             PT Long Term Goals - 09/29/15 1036    PT LONG TERM GOAL #1   Title Caregivers will be independent in performing HEP with patient including  PROM and AAROM exercise for increased joint flexibility by 11/16/15   Baseline Caregivers are doing some PROM but would benefit from education to improve all joint mobility;   Time 12   Period Weeks   Status On-going   PT LONG TERM GOAL #2   Title Patient will be able to sit with 1 UE supported only with back unsupported keeping erect posture with supervision for ADLs such as dressing and bathing by 11/16/15   Baseline unable to sit unsupported without assistance;   Time 12   Period Weeks   Status Partially Met   PT LONG TERM GOAL #3   Title Patient will be mod A for initiating sit<>stand transfer with LRAD to improve functional mobility in the home by 11/16/15   Baseline requires max A to initiate standing but unable to stand without 2 person assist;   Time 12   Period Weeks   Status Not Met   PT LONG TERM GOAL #4   Title Patient will be able to stand with 1 UE supported with CGA for at least 15 sec or more to improve standing tolerance for ADLs by 11/16/15   Baseline Patient unable to stand without heavy assistance; He is standing in stander for 45 min 2x a day but unable to stand with caregivers;    Time 12   Period Weeks   Status Not Met               Plan - 10/10/15 1111    Clinical Impression Statement Patient was late to treatment session; Patient exhibits increased tone in BUE and BLE; Patient was transferred to/from mat table with hoyer lift. PT performed PROM for increased joint mobility and LE flexibility; Patient tolerated some movement well but has increased pain with BLE hip/knee ROM; He reports less discomfort with use of moist heat with PROM for better joint mobility; Patient would benefit from additional skilled PT intervention to improve trunk control and functional mobility;    Pt will benefit from skilled therapeutic intervention in order to improve on the following deficits Abnormal gait;Decreased endurance;Decreased skin integrity;Hypomobility;Impaired  tone;Decreased activity tolerance;Decreased knowledge of use of DME;Decreased strength;Increased muscle spasms;Decreased mobility;Decreased balance;Decreased cognition;Impaired perceived functional ability;Decreased safety awareness;Decreased coordination   Rehab Potential Fair   Clinical Impairments Affecting Rehab Potential positive: good caregiver support; negative: chronic condition; Patient's current clinical presentation is unstable as he has impaired congnition, behavioral changes and mobility deficits that change regularly due to TBI presentation;    PT Frequency 2x / week   PT Duration 12 weeks   PT Treatment/Interventions ADLs/Self  Care Home Management;Cryotherapy;Electrical Stimulation;Moist Heat;Balance training;Therapeutic exercise;Therapeutic activities;Functional mobility training;Gait training;DME Instruction;Neuromuscular re-education;Patient/family education;Taping;Energy conservation;Passive range of motion   PT Next Visit Plan work on bed mobility; advance sitting exercise, Nustep   PT Home Exercise Plan continue as given, use heat for less LE discomfort.    Consulted and Agree with Plan of Care Patient;Family member/caregiver   Family Member Consulted mom        Problem List Patient Active Problem List   Diagnosis Date Noted  . Colitis, Clostridium difficile 06/19/2015  . Hypoxia 05/25/2015  . Clostridium difficile infection 05/25/2015  . Mucus plugging of bronchi 05/25/2015  . Low grade fever 05/25/2015  . History of pulmonary embolism 05/25/2015  . OSA on CPAP 05/25/2015  . Pressure ulcer 05/25/2015  . Convulsions/seizures ([place]) 02/10/2015  . Aspiration pneumonia ([place]) 02/07/2015  . Encephalopathy, metabolic 71/24/5809  . Acute respiratory failure ([place]) 01/29/2015  . Skin irritation 11/09/2014  . TBI (traumatic brain injury) ([place]) 02/04/2012  . 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 Heterotopic ossification 02/04/2012    Avenly Roberge PT, DPT 10/10/2015,  11:14 AM  [place] MAIN [facility] SERVICES [address] [place], [place], [address] Phone: [phone]   Fax:  [phone]  Name: [name] [name] MRN: [id number] Date of Birth: 02/28/1986

## 2015-10-10 NOTE — Therapy (Signed)
[facility] MAIN [facility] SERVICES [address] [place], [place], [address] Phone: [phone]   Fax:  [phone]  Occupational Therapy Treatment  Patient Details  Name: [name] [name] MRN: [id number] Date of Birth: October 23, 1985 Referring Provider: Lysbeth [name]  Encounter Date: 10/10/2015      OT End of Session - 10/10/15 1313    Visit Number 8   Number of Visits 8   Date for OT Re-Evaluation 11/15/15   OT Start Time 1020   OT Stop Time 1100   OT Time Calculation (min) 40 min      Past Medical History  Diagnosis Date  . Hepatitis C   . Scoliosis   . TBI (traumatic brain injury) (HCC) 07/2011  . Subarachnoid hemorrhage (HCC)   . Pulmonary emboli (HCC)   . Hepatitis C   . Depression     Past Surgical History  Procedure Laterality Date  . Fracture surgery      left acetabulum  . Ileostomy    . Lung tube    . Ankle surgery Bilateral   . Tracheostomy    . Peg tube placement    . Janeway gastrostomy  07/02/2014  . Ivc filter placement ([facility] hx)    . Brain surgery      There were no vitals filed for this visit.  Visit Diagnosis:  Weakness generalized      Subjective Assessment - 10/10/15 1311    Subjective  Pt. is having Baclefen tapered in preparation for surgery.    Pertinent History Pt. suffered a TBI   Patient Stated Goals Patient wants to be able to do more for himself, use his phone better.     Currently in Pain? No/denies   Pain Score 0-No pain                      OT Treatments/Exercises (OP) - 10/10/15 1328    Neurological Re-education Exercises   Other Exercises 1 Pt. Performed PROM to the LUE hand to promote good skin integrity. Pt. Presents with increased tone, and tightness.    Other Exercises 2 Pt. Performed reaching activities in various planes. Pt. reached for cones with 2# cuff weights, and upgraded to 3# weights to strengthen RUE. Pt. performed throwing and releasing 4" [place]o balls with the RUE and hand  in attempts to reach a target. Pt. Requires increased cues for redirection.                OT Education - 10/10/15 1313    Education provided Yes   Person(s) Educated Patient   Methods Explanation   Comprehension Verbalized understanding;Verbal cues required             OT Long Term Goals - 08/23/15 1628    OT LONG TERM GOAL #1   Title Patient will increase active motion in R UE sufficient to self feed with or with out assistive devices.   Baseline Unable to feed self any more.   Time 12   Period Weeks   Status New   OT LONG TERM GOAL #2   Title Patient will show improved upper extremity function to be able to use cell phone.   Baseline unable to use cell phone   Time 12   Period Weeks   Status New   OT LONG TERM GOAL #3   Title Patient will improve upper extremity function to be able to be able to wash his face with or with out assistive devices.  Baseline Unable to wash face.   Time 12   Period Days   Status New   OT LONG TERM GOAL #4   Title Will improve R UE range of motion to be able to bruch teeth with or with out assistive devices.   Baseline Unable to brush teeth.   Time 12   Period Weeks   Status New   OT LONG TERM GOAL #5   Title Will be able to positioin L UE to prevent further contractures and skin break down.   Baseline Patient's hand is very tight and arm is stuck in synergy.   Time 12   Period Weeks   Status New               Plan - 10/10/15 1314    Clinical Impression Statement Pt. presents with increased tightness through the left UE and hand. Pt. is tolerating PROM in the left hand today, and strengthening in the RUE. Pt. caregiver does report that tdressing the pt. is becoming more difficult  secondary to increased tone, and tightness from the [place] in [place]. Pt. did verbalize rap lyrics iwith ncreased profanity intermittently throughout the session. Pt. required increased cues.   Pt will benefit from skilled therapeutic  intervention in order to improve on the following deficits (Retired) Decreased activity tolerance;Decreased coordination;Decreased endurance;Decreased range of motion;Decreased skin integrity;Decreased strength;Impaired UE functional use;Impaired tone   Rehab Potential Fair   OT Frequency 1x / week   OT Duration 8 weeks   OT Treatment/Interventions Self-care/ADL training;Neuromuscular education;Therapeutic exercise;Energy conservation;Manual Therapy;Therapeutic activities;Therapeutic exercises;Splinting;Passive range of motion;Patient/family education   Consulted and Agree with Plan of Care Family member/caregiver;Patient   Family Member Consulted cargiver        Problem List Patient Active Problem List   Diagnosis Date Noted  . Acute bronchitis 10/10/2015  . Colitis, Clostridium difficile 06/19/2015  . Hypoxia 05/25/2015  . Clostridium difficile infection 05/25/2015  . Mucus plugging of bronchi 05/25/2015  . Low grade fever 05/25/2015  . History of pulmonary embolism 05/25/2015  . OSA on CPAP 05/25/2015  . Pressure ulcer 05/25/2015  . Convulsions/seizures (HCC) 02/10/2015  . Aspiration pneumonia (HCC) 02/07/2015  . Encephalopathy, metabolic 02/07/2015  . Acute respiratory failure (HCC) 01/29/2015  . Skin irritation 11/09/2014  . TBI (traumatic brain injury) (HCC) 02/04/2012  . 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 Heterotopic ossification 02/04/2012   [name] Fleet [place], MS, OTR/L  [name] Esparanza Krider 10/10/2015, 5:21 PM  [facility] [address] [place], [place], [address] Phone: [phone]   Fax:  [phone]  Name: [name] [name] MRN: [id number] Date of Birth: 12/09/1985

## 2015-10-10 NOTE — Assessment & Plan Note (Signed)
Flare   Plan  Zpack take as directed.  Continue with VEST therapy  Continue on Albuterol nebs Three times a day   Uses Oxygen 2l/m As needed  And At bedtime   Follow up with Dr. Sung AmabileSimonds as planned and As needed   Please contact office for sooner follow up if symptoms do not improve or worsen or seek emergency care

## 2015-10-11 ENCOUNTER — Ambulatory Visit: Payer: 59 | Admitting: Physical Therapy

## 2015-10-11 NOTE — Progress Notes (Signed)
[name] [name], Minard ([id number]) Visit Report for 10/10/2015 Chief Complaint Document Details Patient Name: [name] [name], [name] [name] Date of Service: 10/10/2015 12:45 PM Medical Record Number: [id number] Patient Account Number: [id number] Date of Birth/Sex: 05/19/1986 (29 y.o. Male) Treating RN: Clover MealyAfful, RN, BSN, [place] [facility] Primary Care Physician: Bethann PunchesMiller, Mark Other Clinician: Referring Physician: Bethann PunchesMiller, Mark Treating Physician/Extender: Altamese CarolinaOBSON, MICHAEL G Weeks in Treatment: 9 Information Obtained from: Caregiver Chief Complaint Patient is at the clinic for treatment of an open pressure ulcer to his left heel which she's had for about 4-5 months Electronic Signature(s) Signed: 10/11/2015 8:13:25 AM By: Baltazar Najjarobson, Michael MD Entered By: Baltazar Najjarobson, Michael on 10/10/2015 13:43:45 [name] [name], Holly ([id number]) -------------------------------------------------------------------------------- HPI Details Patient Name: [name] [name], [name] [name] Date of Service: 10/10/2015 12:45 PM Medical Record Number: [id number] Patient Account Number: [id number] Date of Birth/Sex: 02/29/1986 (29 y.o. Male) Treating RN: Clover MealyAfful, RN, BSN, [place] Sinkita Primary Care Physician: Bethann PunchesMiller, Mark Other Clinician: Referring Physician: Bethann PunchesMiller, Mark Treating Physician/Extender: Maxwell CaulOBSON, MICHAEL G Weeks in Treatment: 9 History of Present Illness Location: left heel Quality: Patient reports No Pain. Severity: Patient states wound are getting worse. Duration: Patient has had the wound for > 3 months prior to seeking treatment at the wound center Context: The wound appeared gradually over time Modifying Factors: Other treatment(s) tried include:local care with some silver and zinc paste HPI Description: 30 year old male who has a history of traumatic brain injury and also has a ulcer on his left heel which was noted by the family about 4-5 months ago. The TBI was in January 2013 and prior to this visit the patient has had hyperbaric oxygen therapy approximately 2 years after his  TBI. He is had several gastrostomy tube placed by the gastroenterologist but then finally had a surgical procedure to bring the gastric mucosa to the skin surface. This was done approximately 9 months ago. Past medical history of hepatitis C, traumatic brain injury, pulmonary emboli, ileostomy, tracheostomy, PEG tube placement. Was a former smoker and quit in January 2013 09/05/15; this is a 30 year old man who has a history of traumatic brain injury. He has fed through a PEG tube. He has a pressure ulcer on the left heel which he had for many months. Currently they've been using Santyl to the heel wound which had a surface eschar. There are returning today in follow-up 09/12/15; I saw this patient last week for the first time. He has a pressure ulcer on the left heel which she had had for many months. He was using Santyl and didn't have any product. We divided the wound and changed him to Silver collagen which apparently arrived at his house the next day or/21/17; the patient's wound is roughly half the size of last week currently at 0.6 x 0.5 x 0.1. We have been using Silver collagen for the last 2 weeks. He arrives accompanied by a caregiver. There are no new issues reported 09/26/15; he continues to have improvement over the pressure ulcer on the left Achilles area of his left heel. This is a healthy-looking wound no surface debridement is needed. We'll use Santyl but changed to Silver collagen. Seems to be making good progress and there is epithelialization without evidence of infection all pressure off loading issues have been dealt with at home 10/10/15 the patient arrived today with really no open area over the original wound and the Achilles area of his left heel. However he did have some purple/black discoloration in the wound bed and around. There was no obvious tenderness here no reason to suggest this is an infection.  Best guess here with the subdermal hemorrhage. Electronic  Signature(s) Signed: 10/11/2015 8:13:25 AM By: Baltazar Najjar MD Entered By: Baltazar Najjar on 10/10/2015 13:44:53 [name] [name] ([id number]) -------------------------------------------------------------------------------- Physical Exam Details Patient Name: [name] [name] Date of Service: 10/10/2015 12:45 PM Medical Record Number: [id number] Patient Account Number: [id number] Date of Birth/Sex: [birth year] (29 y.o. Male) Treating RN: Clover Mealy, RN, BSN, [place] Sink Primary Care Physician: Bethann Punches Other Clinician: Referring Physician: Bethann Punches Treating Physician/Extender: Maxwell Caul Weeks in Treatment: 9 Notes Wound exam; the area on the Achilles area appears to have epithelialized. However there is a discoloration as described in the HPI. I could not convince myself that there is any tenderness in the area certainly no erythema and no warmth noted purulence Electronic Signature(s) Signed: 10/11/2015 8:13:25 AM By: Baltazar Najjar MD Entered By: Baltazar Najjar on 10/10/2015 13:46:32 [name] [name] ([id number]) -------------------------------------------------------------------------------- Physician Orders Details Patient Name: [name] [name] Date of Service: 10/10/2015 12:45 PM Medical Record Number: [id number] Patient Account Number: [id number] Date of Birth/Sex: 1985/11/26 (29 y.o. Male) Treating RN: Clover Mealy, RN, BSN, [place] Sink Primary Care Physician: Bethann Punches Other Clinician: Referring Physician: Bethann Punches Treating Physician/Extender: Altamese [place] in Treatment: 9 Verbal / Phone Orders: Yes Clinician: Afful, RN, BSN, Rita Read Back and Verified: Yes Diagnosis Coding ICD-10 Coding Code Description [phone] Pressure ulcer of left heel, stage 2 Z87.820 Personal history of traumatic brain injury Wound Cleansing Wound #1 Left Calcaneus o Clean wound with Normal Saline. Anesthetic Wound #1 Left Calcaneus o Topical Lidocaine 4% cream applied to wound bed prior to  debridement Skin Barriers/Peri-Wound Care Wound #1 Left Calcaneus o Barrier cream Secondary Dressing o Boardered Foam Dressing Dressing Change Frequency Wound #1 Left Calcaneus o Change dressing every other day. Follow-up Appointments Wound #1 Left Calcaneus o Return Appointment in 2 weeks. Off-Loading Wound #1 Left Calcaneus o Heel suspension boot to: - Sage boots in applied . Patient's own Electronic Signature(s) Signed: 10/10/2015 3:54:41 PM By: Elpidio Eric BSN, RN [place], Earna Coder ([id number]) Signed: 10/11/2015 8:13:25 AM By: Baltazar Najjar MD Entered By: Elpidio Eric on 10/10/2015 13:09:31 [name] [name] ([id number]) -------------------------------------------------------------------------------- Problem List Details Patient Name: [name] [name] Date of Service: 10/10/2015 12:45 PM Medical Record Number: [id number] Patient Account Number: [id number] Date of Birth/Sex: 1985-09-26 (29 y.o. Male) Treating RN: Clover Mealy, RN, BSN, [place] Sink Primary Care Physician: Bethann Punches Other Clinician: Referring Physician: Bethann Punches Treating Physician/Extender: Maxwell Caul Weeks in Treatment: 9 Active Problems ICD-10 Encounter Code Description Active Date Diagnosis [phone] Pressure ulcer of left heel, stage 2 08/08/2015 Yes Z87.820 Personal history of traumatic brain injury 08/08/2015 Yes Inactive Problems Resolved Problems Electronic Signature(s) Signed: 10/11/2015 8:13:25 AM By: Baltazar Najjar MD Entered By: Baltazar Najjar on 10/10/2015 13:08:40 [name] [name] ([id number]) -------------------------------------------------------------------------------- Progress Note Details Patient Name: [name] [name] Date of Service: 10/10/2015 12:45 PM Medical Record Number: [id number] Patient Account Number: [id number] Date of Birth/Sex: May 09, 1986 (29 y.o. Male) Treating RN: Clover Mealy, RN, BSN, [place] Sink Primary Care Physician: Bethann Punches Other Clinician: Referring Physician: Bethann Punches Treating Physician/Extender: Altamese [place] in Treatment: 9 Subjective Chief Complaint Information obtained from Caregiver Patient is at the clinic for treatment of an open pressure ulcer to his left heel which she's had for about 4-5 months History of Present Illness (HPI) The following HPI elements were documented for the patient's wound: Location: left heel Quality: Patient reports No Pain. Severity: Patient states wound are getting worse. Duration: Patient has had the wound for > 3 months prior to seeking treatment at the wound center  Context: The wound appeared gradually over time Modifying Factors: Other treatment(s) tried include:local care with some silver and zinc paste 30 year old male who has a history of traumatic brain injury and also has a ulcer on his left heel which was noted by the family about 4-5 months ago. The TBI was in January 2013 and prior to this visit the patient has had hyperbaric oxygen therapy approximately 2 years after his TBI. He is had several gastrostomy tube placed by the gastroenterologist but then finally had a surgical procedure to bring the gastric mucosa to the skin surface. This was done approximately 9 months ago. Past medical history of hepatitis C, traumatic brain injury, pulmonary emboli, ileostomy, tracheostomy, PEG tube placement. Was a former smoker and quit in January 2013 09/05/15; this is a 29 year old man who has a history of traumatic brain injury. He has fed through a PEG tube. He has a pressure ulcer on the left heel which he had for many months. Currently they've been using Santyl to the heel wound which had a surface eschar. There are returning today in follow-up 09/12/15; I saw this patient last week for the first time. He has a pressure ulcer on the left heel which she had had for many months. He was using Santyl and didn't have any product. We divided the wound and changed him to Silver collagen which apparently  arrived at his house the next day or/21/17; the patient's wound is roughly half the size of last week currently at 0.6 x 0.5 x 0.1. We have been using Silver collagen for the last 2 weeks. He arrives accompanied by a caregiver. There are no new issues reported 09/26/15; he continues to have improvement over the pressure ulcer on the left Achilles area of his left heel. This is a healthy-looking wound no surface debridement is needed. We'll use Santyl but changed to Silver collagen. Seems to be making good progress and there is epithelialization without evidence of infection all pressure off loading issues have been dealt with at home 10/10/15 the patient arrived today with really no open area over the original wound and the Achilles area of his left heel. However he did have some purple/black discoloration in the wound bed and around. There Mccarthy, [place] ([id number]) was no obvious tenderness here no reason to suggest this is an infection. Best guess here with the subdermal hemorrhage. Objective Constitutional Vitals Time Taken: 12:53 PM, Height: 72 in, Weight: 200 lbs, BMI: 27.1, Temperature: 98.1 F, Pulse: 66 bpm, Respiratory Rate: 17 breaths/min. Integumentary (Hair, Skin) Wound #1 status is Open. Original cause of wound was Gradually Appeared. The wound is located on the Left Calcaneus. The wound measures 0.1cm length x 0.1cm width x 0.1cm depth; 0.008cm^2 area and 0.001cm^3 volume. The wound is limited to skin breakdown. There is no tunneling or undermining noted. There is a small amount of serosanguineous drainage noted. The wound margin is distinct with the outline attached to the wound base. There is medium (34-66%) pink, pale granulation within the wound bed. There is no necrotic tissue within the wound bed. The periwound skin appearance exhibited: Localized Edema, Moist. The periwound skin appearance did not exhibit: Callus, Crepitus, Excoriation, Fluctuance, Friable, Induration,  Rash, Scarring, Dry/Scaly, Maceration, Atrophie Blanche, Cyanosis, Ecchymosis, Hemosiderin Staining, Mottled, Pallor, Rubor, Erythema. Periwound temperature was noted as No Abnormality. The periwound has tenderness on palpation. Assessment Active Problems ICD-10 W09.811 - Pressure ulcer of left heel, stage 2 Z87.820 - Personal history of traumatic brain injury Plan Wound Cleansing: Wound #  1 Left Calcaneus: Clean wound with Normal Saline. AnestheticRIGBY, LEONHARDT ([id number]) Wound #1 Left Calcaneus: Topical Lidocaine 4% cream applied to wound bed prior to debridement Skin Barriers/Peri-Wound Care: Wound #1 Left Calcaneus: Barrier cream Secondary Dressing: Boardered Foam Dressing Dressing Change Frequency: Wound #1 Left Calcaneus: Change dressing every other day. Follow-up Appointments: Wound #1 Left Calcaneus: Return Appointment in 2 weeks. Off-Loading: Wound #1 Left Calcaneus: Heel suspension boot to: - Sage boots in applied . Patient's own #1 I am not completely sure what his cause of discoloration but it appears to be subdermal. My best guess would be subdermal hemorrhage #2 I don't think the Prisma is necessary. We can continue with the foam based cover and the offloading measures we've already put in place. #3 will have a look at this in 2 weeks. Advised to call if there is any extension of this discoloration. I am hopeful that this is just subdermal hemorrhage that will be reabsorbed Electronic Signature(s) Signed: 10/11/2015 8:13:25 AM By: Baltazar Najjar MD Entered By: Baltazar Najjar on 10/10/2015 13:48:44 [name] [name] ([id number]) -------------------------------------------------------------------------------- SuperBill Details Patient Name: [name] [name] Date of Service: 10/10/2015 Medical Record Number: [id number] Patient Account Number: [id number] Date of Birth/Sex: Aug 23, 1985 (29 y.o. Male) Treating RN: Clover Mealy, RN, BSN, [place] Sink Primary Care Physician: Bethann Punches Other Clinician: Referring Physician: Bethann Punches Treating Physician/Extender: Maxwell Caul Weeks in Treatment: 9 Diagnosis Coding ICD-10 Codes Code Description [phone] Pressure ulcer of left heel, stage 2 Z87.820 Personal history of traumatic brain injury Facility Procedures CPT4 Code: [id number] Description: [phone] - WOUND CARE VISIT-LEV 2 EST PT Modifier: Quantity: 1 Physician Procedures CPT4 Code: [id number] Description: 99212 - WC PHYS LEVEL 2 - EST PT ICD-10 Description Diagnosis L89.622 Pressure ulcer of left heel, stage 2 Modifier: Quantity: 1 Electronic Signature(s) Signed: 10/11/2015 8:13:25 AM By: Baltazar Najjar MD Entered By: Baltazar Najjar on 10/10/2015 13:49:28

## 2015-10-11 NOTE — Progress Notes (Signed)
[name] [name], [name] [name] ([id number]) Visit Report for 10/10/2015 Arrival Information Details Patient Name: [name] [name], [name] [name] Date of Service: 10/10/2015 12:45 PM Medical Record Number: [id number] Patient Account Number: [id number] Date of Birth/Sex: 06/05/1986 (29 y.o. Male) Treating RN: Clover MealyAfful, RN, BSN, [place] Primary Care Physician: Bethann PunchesMiller, [name] Other Clinician: Referring Physician: Bethann PunchesMiller, [name] Treating Physician/Extender: Altamese CarolinaOBSON, [name] G Weeks in Treatment: 9 Visit Information History Since Last Visit Added or deleted any medications: No Patient Arrived: Wheel Chair Any new allergies or adverse reactions: No Arrival Time: 12:53 Had a fall or experienced change in No activities of daily living that may affect Accompanied By: caregiver risk of falls: Transfer Assistance: None Signs or symptoms of abuse/neglect since last No Patient Identification Verified: Yes visito Secondary Verification Process Yes Hospitalized since last visit: No Completed: Has Dressing in Place as Prescribed: Yes Patient Requires Transmission-Based No Pain Present Now: No Precautions: Patient Has Alerts: No Electronic Signature(s) Signed: 10/10/2015 3:54:41 PM By: Elpidio EricAfful, Rita BSN, RN Entered By: Elpidio EricAfful, Rita on 10/10/2015 12:53:40 [name] [name], [name] [name] ([id number]) -------------------------------------------------------------------------------- Clinic Level of Care Assessment Details Patient Name: [name] [name], [name] [name] Date of Service: 10/10/2015 12:45 PM Medical Record Number: [id number] Patient Account Number: [id number] Date of Birth/Sex: 10/06/1985 (29 y.o. Male) Treating RN: Clover MealyAfful, RN, BSN, Rita Primary Care Physician: Bethann PunchesMiller, [name] Other Clinician: Referring Physician: Bethann PunchesMiller, [name] Treating Physician/Extender: Altamese CarolinaOBSON, [name] G Weeks in Treatment: 9 Clinic Level of Care Assessment Items TOOL 4 Quantity Score []  - Use when only an EandM is performed on FOLLOW-UP visit 0 ASSESSMENTS - Nursing Assessment / Reassessment X -  Reassessment of Co-morbidities (includes updates in patient status) 1 10 X - Reassessment of Adherence to Treatment Plan 1 5 ASSESSMENTS - Wound and Skin Assessment / Reassessment X - Simple Wound Assessment / Reassessment - one wound 1 5 []  - Complex Wound Assessment / Reassessment - multiple wounds 0 []  - Dermatologic / Skin Assessment (not related to wound area) 0 ASSESSMENTS - Focused Assessment []  - Circumferential Edema Measurements - multi extremities 0 []  - Nutritional Assessment / Counseling / Intervention 0 []  - Lower Extremity Assessment (monofilament, tuning fork, pulses) 0 []  - Peripheral Arterial Disease Assessment (using hand held doppler) 0 ASSESSMENTS - Ostomy and/or Continence Assessment and Care []  - Incontinence Assessment and Management 0 []  - Ostomy Care Assessment and Management (repouching, etc.) 0 PROCESS - Coordination of Care X - Simple Patient / Family Education for ongoing care 1 15 []  - Complex (extensive) Patient / Family Education for ongoing care 0 []  - Staff obtains ChiropractorConsents, Records, Test Results / Process Orders 0 []  - Staff telephones HHA, Nursing Homes / Clarify orders / etc 0 []  - Routine Transfer to another Facility (non-emergent condition) 0 Tischer, [name] [name] ([id number]) []  - Routine Hospital Admission (non-emergent condition) 0 []  - New Admissions / Manufacturing engineernsurance Authorizations / Ordering NPWT, Apligraf, etc. 0 []  - Emergency Hospital Admission (emergent condition) 0 []  - Simple Discharge Coordination 0 []  - Complex (extensive) Discharge Coordination 0 PROCESS - Special Needs []  - Pediatric / Minor Patient Management 0 []  - Isolation Patient Management 0 []  - Hearing / Language / Visual special needs 0 []  - Assessment of Community assistance (transportation, D/C planning, etc.) 0 []  - Additional assistance / Altered mentation 0 []  - Support Surface(s) Assessment (bed, cushion, seat, etc.) 0 INTERVENTIONS - Wound Cleansing / Measurement X - Simple  Wound Cleansing - one wound 1 5 []  - Complex Wound Cleansing - multiple wounds 0 X - Wound Imaging (photographs - any number of wounds) 1 5 []  -  Wound Tracing (instead of photographs) 0 X - Simple Wound Measurement - one wound 1 5  - Complex Wound Measurement - multiple wounds 0 INTERVENTIONS - Wound Dressings X - Small Wound Dressing one or multiple wounds 1 10  - Medium Wound Dressing one or multiple wounds 0  - Large Wound Dressing one or multiple wounds 0  - Application of Medications - topical 0  - Application of Medications - injection 0 INTERVENTIONS - Miscellaneous  - External ear exam 0 [name], [name] ([id number])  - Specimen Collection (cultures, biopsies, blood, body fluids, etc.) 0  - Specimen(s) / Culture(s) sent or taken to Lab for analysis 0  - Patient Transfer (multiple staff / Michiel Sites Lift / Similar devices) 0  - Simple Staple / Suture removal (25 or less) 0  - Complex Staple / Suture removal (26 or more) 0  - Hypo / Hyperglycemic Management (close monitor of Blood Glucose) 0  - Ankle / Brachial Index (ABI) - do not check if billed separately 0 X - Vital Signs 1 5 Has the patient been seen at the hospital within the last three years: Yes Total Score: 65 Level Of Care: New/Established - Level 2 Electronic Signature(s) Signed: 10/10/2015 3:54:41 PM By: Elpidio Eric BSN, RN Entered By: Elpidio Eric on 10/10/2015 13:10:00 [name] [name] ([id number]) -------------------------------------------------------------------------------- Encounter Discharge Information Details Patient Name: [name] [name] Date of Service: 10/10/2015 12:45 PM Medical Record Number: [id number] Patient Account Number: [id number] Date of Birth/Sex: [birth year] (29 y.o. Male) Treating RN: Clover Mealy, RN, BSN, Rita Primary Care Physician: Bethann Punches Other Clinician: Referring Physician: Bethann Punches Treating Physician/Extender: Altamese [place] in Treatment: 9 Encounter  Discharge Information Items Discharge Pain Level: 0 Discharge Condition: Stable Ambulatory Status: Wheelchair Discharge Destination: Home Private Transportation: Auto Accompanied By: caregiver Schedule Follow-up Appointment: No Medication Reconciliation completed and No provided to Patient/Care Monterrius Cardosa: Clinical Summary of Care: Electronic Signature(s) Signed: 10/10/2015 3:54:41 PM By: Elpidio Eric BSN, RN Entered By: Elpidio Eric on 10/10/2015 13:10:47 [name] [name] ([id number]) -------------------------------------------------------------------------------- Lower Extremity Assessment Details Patient Name: [name] [name] Date of Service: 10/10/2015 12:45 PM Medical Record Number: [id number] Patient Account Number: [id number] Date of Birth/Sex: [birth year] (29 y.o. Male) Treating RN: Clover Mealy, RN, BSN, [place] Primary Care Physician: Bethann Punches Other Clinician: Referring Physician: Bethann Punches Treating Physician/Extender: Maxwell Caul Weeks in Treatment: 9 Vascular Assessment Pulses: Posterior Tibial Dorsalis Pedis Palpable: [Left:Yes] Extremity colors, hair growth, and conditions: Extremity Color: [Left:Normal] Hair Growth on Extremity: [Left:Yes] Temperature of Extremity: [Left:Warm] Capillary Refill: [Left:< 3 seconds] Toe Nail Assessment Left: Right: Thick: No Discolored: No Deformed: No Improper Length and Hygiene: No Electronic Signature(s) Signed: 10/10/2015 3:54:41 PM By: Elpidio Eric BSN, RN Entered By: Elpidio Eric on 10/10/2015 12:56:35 [name] [name] ([id number]) -------------------------------------------------------------------------------- Multi Wound Chart Details Patient Name: [name] [name] Date of Service: 10/10/2015 12:45 PM Medical Record Number: [id number] Patient Account Number: [id number] Date of Birth/Sex: 1986/01/03 (29 y.o. Male) Treating RN: Clover Mealy, RN, BSN, Rita Primary Care Physician: Bethann Punches Other Clinician: Referring Physician: Bethann Punches Treating Physician/Extender: Maxwell Caul Weeks in Treatment: 9 Vital Signs Height(in): 72 Pulse(bpm): 66 Weight(lbs): 200 Blood Pressure (mmHg): Body Mass Index(BMI): 27 Temperature(F): 98.1 Respiratory Rate 17 (breaths/min): Photos: [1:No Photos] [N/A:N/A] Wound Location: [1:Left Calcaneus] [N/A:N/A] Wounding Event: [1:Gradually Appeared] [N/A:N/A] Primary Etiology: [1:Infection - not elsewhere N/A classified] Comorbid History: [1:Hypertension, Hepatitis C, N/A History of pressure wounds, Paraplegia] Date Acquired: [1:06/11/2015] [N/A:N/A] Weeks of Treatment: [1:9] [N/A:N/A] Wound Status: [1:Open] [N/A:N/A] Measurements L x W x D 0.1x0.1x0.1 [N/A:N/A] (  cm) Area (cm) : [1:0.008] [N/A:N/A] Volume (cm) : [1:0.001] [N/A:N/A] % Reduction in Area: [1:99.40%] [N/A:N/A] % Reduction in Volume: 99.30% [N/A:N/A] Classification: [1:Full Thickness Without Exposed Support Structures] [N/A:N/A] Exudate Amount: [1:Small] [N/A:N/A] Exudate Type: [1:Serosanguineous] [N/A:N/A] Exudate Color: [1:red, brown] [N/A:N/A] Wound Margin: [1:Distinct, outline attached N/A] Granulation Amount: [1:Medium (34-66%)] [N/A:N/A] Granulation Quality: [1:Pink, Pale] [N/A:N/A] Necrotic Amount: [1:None Present (0%)] [N/A:N/A] Exposed Structures: [1:Fascia: No Fat: No Tendon: No] [N/A:N/A] Muscle: No Joint: No Bone: No Limited to Skin Breakdown Epithelialization: Large (67-100%) N/A N/A Periwound Skin Texture: Edema: Yes N/A N/A Excoriation: No Induration: No Callus: No Crepitus: No Fluctuance: No Friable: No Rash: No Scarring: No Periwound Skin Moist: Yes N/A N/A Moisture: Maceration: No Dry/Scaly: No Periwound Skin Color: Atrophie Blanche: No N/A N/A Cyanosis: No Ecchymosis: No Erythema: No Hemosiderin Staining: No Mottled: No Pallor: No Rubor: No Temperature: No Abnormality N/A N/A Tenderness on Yes N/A N/A Palpation: Wound Preparation: Ulcer Cleansing: N/A  N/A Rinsed/Irrigated with Saline Topical Anesthetic Applied: None Treatment Notes Electronic Signature(s) Signed: 10/10/2015 3:54:41 PM By: Elpidio Eric BSN, RN Entered By: Elpidio Eric on 10/10/2015 13:08:46 [name] [name] ([id number]) -------------------------------------------------------------------------------- Multi-Disciplinary Care Plan Details Patient Name: [name] [name] Date of Service: 10/10/2015 12:45 PM Medical Record Number: [id number] Patient Account Number: [id number] Date of Birth/Sex: 04-Sep-1985 (30 y.o. Male) Treating RN: Clover Mealy, RN, BSN, Rita Primary Care Physician: Bethann Punches Other Clinician: Referring Physician: Bethann Punches Treating Physician/Extender: Maxwell Caul Weeks in Treatment: 9 Active Inactive Electronic Signature(s) Signed: 10/10/2015 3:54:41 PM By: Elpidio Eric BSN, RN Entered By: Elpidio Eric on 10/10/2015 13:08:38 [name] [name] ([id number]) -------------------------------------------------------------------------------- Pain Assessment Details Patient Name: [name] [name] Date of Service: 10/10/2015 12:45 PM Medical Record Number: [id number] Patient Account Number: [id number] Date of Birth/Sex: 01/11/1986 (29 y.o. Male) Treating RN: Clover Mealy, RN, BSN, Rita Primary Care Physician: Bethann Punches Other Clinician: Referring Physician: Bethann Punches Treating Physician/Extender: Maxwell Caul Weeks in Treatment: 9 Active Problems Location of Pain Severity and Description of Pain Patient Has Paino No Site Locations Pain Management and Medication Current Pain Management: Electronic Signature(s) Signed: 10/10/2015 3:54:41 PM By: Elpidio Eric BSN, RN Entered By: Elpidio Eric on 10/10/2015 12:53:46 [name] [name] ([id number]) -------------------------------------------------------------------------------- Patient/Caregiver Education Details Patient Name: [name] [name] Date of Service: 10/10/2015 12:45 PM Medical Record Number: [id number] Patient Account  Number: [id number] Date of Birth/Gender: 20-Feb-1986 (29 y.o. Male) Treating RN: Clover Mealy, RN, BSN, Rita Primary Care Physician: Bethann Punches Other Clinician: Referring Physician: Bethann Punches Treating Physician/Extender: Altamese [place] in Treatment: 9 Education Assessment Education Provided To: Caregiver Education Topics Provided Offloading: Methods: Explain/Verbal Responses: State content correctly Wound/Skin Impairment: Methods: Explain/Verbal Responses: State content correctly Electronic Signature(s) Signed: 10/10/2015 3:54:41 PM By: Elpidio Eric BSN, RN Entered By: Elpidio Eric on 10/10/2015 13:11:04 [name] [name] ([id number]) -------------------------------------------------------------------------------- Wound Assessment Details Patient Name: [name] [name] Date of Service: 10/10/2015 12:45 PM Medical Record Number: [id number] Patient Account Number: [id number] Date of Birth/Sex: 11-08-1985 (29 y.o. Male) Treating RN: Clover Mealy, RN, BSN, Rita Primary Care Physician: Bethann Punches Other Clinician: Referring Physician: Bethann Punches Treating Physician/Extender: Altamese [place] in Treatment: 9 Wound Status Wound Number: 1 Primary Infection - not elsewhere classified Etiology: Wound Location: Left Calcaneus Wound Open Wounding Event: Gradually Appeared Status: Date Acquired: 06/11/2015 Comorbid Hypertension, Hepatitis C, History of Weeks Of Treatment: 9 History: pressure wounds, Paraplegia Clustered Wound: No Wound Measurements Length: (cm) 0.1 Width: (cm) 0.1 Depth: (cm) 0.1 Area: (cm) 0.008 Volume: (cm) 0.001 % Reduction in Area: 99.4% % Reduction in Volume: 99.3% Epithelialization:  Large (67-100%) Tunneling: No Undermining: No Wound Description Full Thickness Without Exposed Classification: Support Structures Wound Margin: Distinct, outline attached Exudate Small Amount: Exudate Type: Serosanguineous Exudate Color: red, brown Foul Odor After  Cleansing: No Wound Bed Granulation Amount: Medium (34-66%) Exposed Structure Granulation Quality: Pink, Pale Fascia Exposed: No Necrotic Amount: None Present (0%) Fat Layer Exposed: No Tendon Exposed: No Muscle Exposed: No Joint Exposed: No Bone Exposed: No Limited to Skin Breakdown Periwound Skin Texture Texture Color No Abnormalities Noted: No No Abnormalities Noted: No Callus: No Atrophie Blanche: No Crepitus: No Cyanosis: No [place], Earna Coder ([id number]) Excoriation: No Ecchymosis: No Fluctuance: No Erythema: No Friable: No Hemosiderin Staining: No Induration: No Mottled: No Localized Edema: Yes Pallor: No Rash: No Rubor: No Scarring: No Temperature / Pain Moisture Temperature: No Abnormality No Abnormalities Noted: No Tenderness on Palpation: Yes Dry / Scaly: No Maceration: No Moist: Yes Wound Preparation Ulcer Cleansing: Rinsed/Irrigated with Saline Topical Anesthetic Applied: None Treatment Notes Wound #1 (Left Calcaneus) 1. Cleansed with: Clean wound with Normal Saline 3. Peri-wound Care: Barrier cream 5. Secondary Dressing Applied Bordered Foam Dressing Notes sage boot Electronic Signature(s) Signed: 10/10/2015 3:54:41 PM By: Elpidio Eric BSN, RN Entered By: Elpidio Eric on 10/10/2015 13:08:30 [name] [name] ([id number]) -------------------------------------------------------------------------------- Vitals Details Patient Name: [name] [name] Date of Service: 10/10/2015 12:45 PM Medical Record Number: [id number] Patient Account Number: [id number] Date of Birth/Sex: Dec 11, 1985 (29 y.o. Male) Treating RN: Clover Mealy, RN, BSN, Rita Primary Care Physician: Bethann Punches Other Clinician: Referring Physician: Bethann Punches Treating Physician/Extender: Altamese [place] in Treatment: 9 Vital Signs Time Taken: 12:53 Temperature (F): 98.1 Height (in): 72 Pulse (bpm): 66 Weight (lbs): 200 Respiratory Rate (breaths/min): 17 Body Mass Index (BMI):  27.1 Reference Range: 80 - 120 mg / dl Electronic Signature(s) Signed: 10/10/2015 3:54:41 PM By: Elpidio Eric BSN, RN Entered By: Elpidio Eric on 10/10/2015 12:54:51

## 2015-10-12 ENCOUNTER — Ambulatory Visit: Payer: Commercial Managed Care - HMO | Admitting: Physical Therapy

## 2015-10-12 ENCOUNTER — Encounter: Payer: Self-pay | Admitting: Physical Therapy

## 2015-10-12 DIAGNOSIS — R460 Very low level of personal hygiene: Secondary | ICD-10-CM

## 2015-10-12 DIAGNOSIS — R531 Weakness: Secondary | ICD-10-CM

## 2015-10-12 DIAGNOSIS — Z9181 History of falling: Secondary | ICD-10-CM

## 2015-10-12 DIAGNOSIS — Z7409 Other reduced mobility: Secondary | ICD-10-CM

## 2015-10-12 DIAGNOSIS — Z741 Need for assistance with personal care: Secondary | ICD-10-CM

## 2015-10-12 NOTE — Therapy (Signed)
[place] MAIN [facility] SERVICES [address] [place], [place], [address] Phone: [phone]   Fax:  [phone]  Physical Therapy Treatment  Patient Details  Name: [name] [name] MRN: [id number] Date of Birth: 1985-08-29 Referring Provider: Rusty Aus MD  Encounter Date: 10/12/2015      PT End of Session - 10/12/15 1251    Visit Number 12   Number of Visits 25   Date for PT Re-Evaluation 11/16/15   PT Start Time 9977   PT Stop Time 1030   PT Time Calculation (min) 28 min   Activity Tolerance Patient limited by pain   Behavior During Therapy Agitated;Restless;Anxious      Past Medical History  Diagnosis Date  . Hepatitis C   . Scoliosis   . TBI (traumatic brain injury) ([place]) 07/2011  . Subarachnoid hemorrhage ([place])   . Pulmonary emboli ([place])   . Hepatitis C   . Depression     Past Surgical History  Procedure Laterality Date  . Fracture surgery      left acetabulum  . Ileostomy    . Lung tube    . Ankle surgery Bilateral   . Tracheostomy    . Peg tube placement    . [place] gastrostomy  07/02/2014  . Ivc filter placement ([facility] hx)    . Brain surgery      There were no vitals filed for this visit.  Visit Diagnosis:  Weakness generalized  Self-care deficit for dressing and grooming  Impaired mobility  Risk for falls      Subjective Assessment - 10/12/15 1249    Subjective Patient was late to treatment session due to unexpected bowel accident; Patient reports increased tightness in legs; He did not want to get out of his chair today;    Patient is accompained by: Family member   Pertinent History personal factors affecting rehab: chronic condition, high fall risk; LLE hip heterotrophic osscification; impaired cognition, ranchos scale #4 with inappropriate behavior at times;    How long can you sit comfortably? na   How long can you stand comfortably? able to stand for a few seconds with max A; stands in stander for 45  min at a time 2x a day;   How long can you walk comfortably? unable to ambulate at this time   Patient Stated Goals having leg pain, hoping to work on HEP and mobility; Improve mobility prior to stem cell surgery;    Currently in Pain? No/denies      TREATMENT:  Patient was max A +2 for transfer wheelchair to mat table with hoyer lift;  Patient sitting on mat table, mod A for holding sitting position with legs on floor: Balloon taps with cues to work on reaching left/right with RUE x5 min with cues for increased weight shift and to increase forward lean; Instructed patient in using RUE for bouncing basketball x5 reps with cues to increase open palm; Patient required mod VCs and tactile cues for opening RUE hand for dribbling ball x3 min  Patient sitting on edge of mat table, with RUE holding self up working on improving sitting position and posture, x5 sec x3 reps, with close supervision; Patient demonstrates increased right trunk lean with right cervical lateral flexion;  Patient was able to hold self up into sitting for 10 sec without assistance at end of treatment session; He is still unable to hold head into extension for longer than 1-2 sec;  Patient was transferred back to wheelchair  and positioned safely max A +2 with hoyer lift;                           PT Education - 10/12/15 1251    Education provided Yes   Education Details sitting posture   Person(s) Educated Patient   Methods Explanation;Verbal cues   Comprehension Verbalized understanding;Returned demonstration;Verbal cues required             PT Long Term Goals - 09/29/15 1036    PT LONG TERM GOAL #1   Title Caregivers will be independent in performing HEP with patient including PROM and AAROM exercise for increased joint flexibility by 11/16/15   Baseline Caregivers are doing some PROM but would benefit from education to improve all joint mobility;   Time 12   Period Weeks   Status  On-going   PT LONG TERM GOAL #2   Title Patient will be able to sit with 1 UE supported only with back unsupported keeping erect posture with supervision for ADLs such as dressing and bathing by 11/16/15   Baseline unable to sit unsupported without assistance;   Time 12   Period Weeks   Status Partially Met   PT LONG TERM GOAL #3   Title Patient will be mod A for initiating sit<>stand transfer with LRAD to improve functional mobility in the home by 11/16/15   Baseline requires max A to initiate standing but unable to stand without 2 person assist;   Time 12   Period Weeks   Status Not Met   PT LONG TERM GOAL #4   Title Patient will be able to stand with 1 UE supported with CGA for at least 15 sec or more to improve standing tolerance for ADLs by 11/16/15   Baseline Patient unable to stand without heavy assistance; He is standing in stander for 45 min 2x a day but unable to stand with caregivers;    Time 12   Period Weeks   Status Not Met               Plan - 10/12/15 1251    Clinical Impression Statement Patient was late to treatment session; Focused on unsupported sitting and reaching tasks to improve trunk control. Patient required mod VCs for improved weight shift to increase trunk control. He was able to sit with RUE holding table for 10 sec without support, feet on floor with increased right trunk lean. He would benefit from additional skilled PT intervention to improve sitting balance and functional mobility; Caregiver reports that patient is approved for stem cell surgery in April 2017   Pt will benefit from skilled therapeutic intervention in order to improve on the following deficits Abnormal gait;Decreased endurance;Decreased skin integrity;Hypomobility;Impaired tone;Decreased activity tolerance;Decreased knowledge of use of DME;Decreased strength;Increased muscle spasms;Decreased mobility;Decreased balance;Decreased cognition;Impaired perceived functional ability;Decreased  safety awareness;Decreased coordination   Rehab Potential Fair   Clinical Impairments Affecting Rehab Potential positive: good caregiver support; negative: chronic condition; Patient's current clinical presentation is unstable as he has impaired congnition, behavioral changes and mobility deficits that change regularly due to TBI presentation;    PT Frequency 2x / week   PT Duration 12 weeks   PT Treatment/Interventions ADLs/Self Care Home Management;Cryotherapy;Electrical Stimulation;Moist Heat;Balance training;Therapeutic exercise;Therapeutic activities;Functional mobility training;Gait training;DME Instruction;Neuromuscular re-education;Patient/family education;Taping;Energy conservation;Passive range of motion   PT Next Visit Plan work on bed mobility; advance sitting exercise, Nustep   PT Home Exercise Plan continue as given, use heat for  less LE discomfort.    Consulted and Agree with Plan of Care Patient;Family member/caregiver   Family Member Consulted mom        Problem List Patient Active Problem List   Diagnosis Date Noted  . Acute bronchitis 10/10/2015  . Colitis, Clostridium difficile 06/19/2015  . Hypoxia 05/25/2015  . Clostridium difficile infection 05/25/2015  . Mucus plugging of bronchi 05/25/2015  . Low grade fever 05/25/2015  . History of pulmonary embolism 05/25/2015  . OSA on CPAP 05/25/2015  . Pressure ulcer 05/25/2015  . Convulsions/seizures ([place]) 02/10/2015  . Aspiration pneumonia ([place]) 02/07/2015  . Encephalopathy, metabolic 45/80/9983  . Acute respiratory failure ([place]) 01/29/2015  . Skin irritation 11/09/2014  . TBI (traumatic brain injury) ([place]) 02/04/2012  . 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 Heterotopic ossification 02/04/2012    Trotter,Margaret PT, DPT 10/12/2015, 12:53 PM  [place] MAIN [facility] SERVICES [address] [place], [place], [address] Phone: [phone]   Fax:  [phone]  Name: [name] [name] MRN: [id number] Date of Birth: 01/08/1986

## 2015-10-16 ENCOUNTER — Ambulatory Visit: Payer: 59 | Admitting: Physical Therapy

## 2015-10-16 ENCOUNTER — Encounter: Payer: 59 | Admitting: Speech Pathology

## 2015-10-16 ENCOUNTER — Ambulatory Visit: Payer: 59

## 2015-10-17 ENCOUNTER — Encounter: Payer: 59 | Admitting: Speech Pathology

## 2015-10-17 ENCOUNTER — Encounter: Payer: 59 | Admitting: Occupational Therapy

## 2015-10-17 ENCOUNTER — Ambulatory Visit: Payer: Commercial Managed Care - HMO | Admitting: Physical Therapy

## 2015-10-17 ENCOUNTER — Encounter: Payer: Self-pay | Admitting: Physical Therapy

## 2015-10-17 DIAGNOSIS — Z741 Need for assistance with personal care: Secondary | ICD-10-CM

## 2015-10-17 DIAGNOSIS — R460 Very low level of personal hygiene: Secondary | ICD-10-CM

## 2015-10-17 DIAGNOSIS — Z7409 Other reduced mobility: Secondary | ICD-10-CM

## 2015-10-17 DIAGNOSIS — R531 Weakness: Secondary | ICD-10-CM | POA: Diagnosis not present

## 2015-10-17 DIAGNOSIS — Z9181 History of falling: Secondary | ICD-10-CM

## 2015-10-17 NOTE — Therapy (Signed)
[place] MAIN [facility] SERVICES [address] [place], [place], [address] Phone: [phone]   Fax:  [phone]  Physical Therapy Treatment  Patient Details  Name: [name] [name] MRN: [id number] Date of Birth: 1986/05/23 Referring Provider: Rusty Aus MD  Encounter Date: 10/17/2015      PT End of Session - 10/17/15 1148    Visit Number 13   Number of Visits 25   Date for PT Re-Evaluation 11/16/15   PT Start Time 0940   PT Stop Time 1020   PT Time Calculation (min) 40 min   Activity Tolerance Patient limited by pain   Behavior During Therapy Agitated;Restless;Anxious      Past Medical History  Diagnosis Date  . Hepatitis C   . Scoliosis   . TBI (traumatic brain injury) ([place]) 07/2011  . Subarachnoid hemorrhage ([place])   . Pulmonary emboli ([place])   . Hepatitis C   . Depression     Past Surgical History  Procedure Laterality Date  . Fracture surgery      left acetabulum  . Ileostomy    . Lung tube    . Ankle surgery Bilateral   . Tracheostomy    . Peg tube placement    . [place] gastrostomy  07/02/2014  . Ivc filter placement ([facility] h[facility])    . Brain surgery      There were no vitals filed for this visit.  Visit Diagnosis:  Weakness generalized  Self-care deficit for dressing and grooming  Impaired mobility  Risk for falls      Subjective Assessment - 10/17/15 1025    Subjective Patient was late to PT session; Caregiver reports that he did get approved for stem cell surgery in [facility]; Unsure of date; Patient reports, "My legs are so tight. I do not want to get out of my chair"   Patient is accompained by: Family member   Pertinent History personal factors affecting rehab: chronic condition, high fall risk; LLE hip heterotrophic osscification; impaired cognition, ranchos scale #4 with inappropriate behavior at times;    How long can you sit comfortably? na   How long can you stand comfortably? able to stand for a few  seconds with max A; stands in stander for 45 min at a time 2x a day;   How long can you walk comfortably? unable to ambulate at this time   Patient Stated Goals having leg pain, hoping to work on HEP and mobility; Improve mobility prior to stem cell surgery;    Currently in Pain? --  no pain at rest; increased pain with movement; unable to rate;       TREATMENT:  Patient is max A +2 for transferring wheelchair<>mat table with hoyer lift x2 reps;  Patient supine: PT applied moist heat to BLE in supine x5 min (unbilled);  Pball under BLE: Lumbar trunk rotation x2 min with overpressure to improve lumbar/pelvic mobility; Instructed patient in pball bridges with max verbal and tactile cues to increase hip extensor activation; however patient exhibits increased abdominal flexion and hip flexion activation while trying to do bridge being unable to lift hips;  PT attempted passive single knee to chest PROM of RLE x3 reps however patient exhibits increased extensor tone and very agitated with hip/knee flexion;  Patient instructed in short arc quad x5 reps each LE with AAROM on LLE; patient requires mod VCs and tactile cues for attention to task; He is slow to initiate LE movement;  Patient continues to  report increased discomfort in LLE; PT performed long axis distraction with gentle oscillations and passive movement into hip abduction 20 sec bouts x10 reps Patient able to exhibit good knee extension on RLE with good patella mobility; His LLE knee is locked into approximately 10-15 degrees of flexion with minimal patella mobility;                              PT Education - 10/17/15 1148    Education provided Yes   Education Details ROM/stretches   Person(s) Educated Patient   Methods Explanation;Verbal cues   Comprehension Verbalized understanding;Returned demonstration;Verbal cues required             PT Long Term Goals - 09/29/15 1036    PT LONG TERM GOAL  #1   Title Caregivers will be independent in performing HEP with patient including PROM and AAROM exercise for increased joint flexibility by 11/16/15   Baseline Caregivers are doing some PROM but would benefit from education to improve all joint mobility;   Time 12   Period Weeks   Status On-going   PT LONG TERM GOAL #2   Title Patient will be able to sit with 1 UE supported only with back unsupported keeping erect posture with supervision for ADLs such as dressing and bathing by 11/16/15   Baseline unable to sit unsupported without assistance;   Time 12   Period Weeks   Status Partially Met   PT LONG TERM GOAL #3   Title Patient will be mod A for initiating sit<>stand transfer with LRAD to improve functional mobility in the home by 11/16/15   Baseline requires max A to initiate standing but unable to stand without 2 person assist;   Time 12   Period Weeks   Status Not Met   PT LONG TERM GOAL #4   Title Patient will be able to stand with 1 UE supported with CGA for at least 15 sec or more to improve standing tolerance for ADLs by 11/16/15   Baseline Patient unable to stand without heavy assistance; He is standing in stander for 45 min 2x a day but unable to stand with caregivers;    Time 12   Period Weeks   Status Not Met               Plan - 10/17/15 1148    Clinical Impression Statement Patient exhibits increased tightness and tone in BLE with decreased tolerance with PROM; He requires moist heat and constant cues to relax during ROM exercise. Patient continues to be agressive with hip/knee flexion and is anxious about any LE movement. He would benefit from additional skilled PT intervention to improve LE ROM/strength and functional mobility;    Pt will benefit from skilled therapeutic intervention in order to improve on the following deficits Abnormal gait;Decreased endurance;Decreased skin integrity;Hypomobility;Impaired tone;Decreased activity tolerance;Decreased knowledge of  use of DME;Decreased strength;Increased muscle spasms;Decreased mobility;Decreased balance;Decreased cognition;Impaired perceived functional ability;Decreased safety awareness;Decreased coordination   Rehab Potential Fair   Clinical Impairments Affecting Rehab Potential positive: good caregiver support; negative: chronic condition; Patient's current clinical presentation is unstable as he has impaired congnition, behavioral changes and mobility deficits that change regularly due to TBI presentation;    PT Frequency 2x / week   PT Duration 12 weeks   PT Treatment/Interventions ADLs/Self Care Home Management;Cryotherapy;Electrical Stimulation;Moist Heat;Balance training;Therapeutic exercise;Therapeutic activities;Functional mobility training;Gait training;DME Instruction;Neuromuscular re-education;Patient/family education;Taping;Energy conservation;Passive range of motion   PT Next  Visit Plan work on bed mobility; advance sitting exercise, Nustep   PT Home Exercise Plan continue as given, use heat for less LE discomfort.    Consulted and Agree with Plan of Care Patient;Family member/caregiver   Family Member Consulted mom        Problem List Patient Active Problem List   Diagnosis Date Noted  . Acute bronchitis 10/10/2015  . Colitis, Clostridium difficile 06/19/2015  . Hypoxia 05/25/2015  . Clostridium difficile infection 05/25/2015  . Mucus plugging of bronchi 05/25/2015  . Low grade fever 05/25/2015  . History of pulmonary embolism 05/25/2015  . OSA on CPAP 05/25/2015  . Pressure ulcer 05/25/2015  . Convulsions/seizures ([place]) 02/10/2015  . Aspiration pneumonia ([place]) 02/07/2015  . Encephalopathy, metabolic 12/87/8676  . Acute respiratory failure ([place]) 01/29/2015  . Skin irritation 11/09/2014  . TBI (traumatic brain injury) ([place]) 02/04/2012  . 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 Heterotopic ossification 02/04/2012    Trotter,Margaret PT, DPT 10/17/2015, 11:52 AM  [place] MAIN [facility] SERVICES [address] [place], [place], [address] Phone: [phone]   Fax:  [phone]  Name: Eryck Negron MRN: [id number] Date of Birth: Jul 13, 1986

## 2015-10-18 ENCOUNTER — Ambulatory Visit: Payer: 59

## 2015-10-19 ENCOUNTER — Ambulatory Visit: Payer: Commercial Managed Care - HMO | Admitting: Occupational Therapy

## 2015-10-19 ENCOUNTER — Ambulatory Visit: Payer: Commercial Managed Care - HMO | Admitting: Physical Therapy

## 2015-10-23 ENCOUNTER — Ambulatory Visit: Payer: 59 | Admitting: Physical Therapy

## 2015-10-23 ENCOUNTER — Ambulatory Visit: Payer: 59

## 2015-10-24 ENCOUNTER — Encounter: Payer: 59 | Admitting: Occupational Therapy

## 2015-10-24 ENCOUNTER — Ambulatory Visit: Payer: Commercial Managed Care - HMO | Admitting: Physical Therapy

## 2015-10-24 ENCOUNTER — Encounter: Payer: 59 | Admitting: Speech Pathology

## 2015-10-24 ENCOUNTER — Encounter: Payer: 59 | Admitting: Internal Medicine

## 2015-10-24 DIAGNOSIS — L89622 Pressure ulcer of left heel, stage 2: Secondary | ICD-10-CM | POA: Diagnosis not present

## 2015-10-24 NOTE — Progress Notes (Addendum)
SELASSIE, SPATAFORE ([id number]) Visit Report for 10/24/2015 Arrival Information Details Patient Name: [name] [name], [name] [name] Date of Service: 10/24/2015 12:45 PM Medical Record Number: [id number] Patient Account Number: [id number] Date of Birth/Sex: 06/06/1986 (30 y.o. Male) Treating RN: Clover Mealy, RN, BSN, [place] Sink Primary Care Physician: Bethann Punches Other Clinician: Referring Physician: Bethann Punches Treating Physician/Extender: Altamese [place] in Treatment: 11 Visit Information History Since Last Visit Added or deleted any medications: No Patient Arrived: Wheel Chair Any new allergies or adverse reactions: No Arrival Time: 12:49 Had a fall or experienced change in No activities of daily living that may affect Accompanied By: caregiver risk of falls: Transfer Assistance: None Signs or symptoms of abuse/neglect since last No Patient Identification Verified: Yes visito Secondary Verification Process Yes Hospitalized since last visit: No Completed: Has Dressing in Place as Prescribed: Yes Patient Requires Transmission-Based No Pain Present Now: No Precautions: Patient Has Alerts: No Electronic Signature(s) Signed: 10/24/2015 12:49:29 PM By: Elpidio Eric BSN, RN Entered By: Elpidio Eric on 10/24/2015 12:49:29 [name] [name] ([id number]) -------------------------------------------------------------------------------- Clinic Level of Care Assessment Details Patient Name: [name] [name] Date of Service: 10/24/2015 12:45 PM Medical Record Number: [id number] Patient Account Number: [id number] Date of Birth/Sex: 1986-05-11 (30 y.o. Male) Treating RN: Clover Mealy, RN, BSN, Rita Primary Care Physician: Bethann Punches Other Clinician: Referring Physician: Bethann Punches Treating Physician/Extender: Altamese [place] in Treatment: 11 Clinic Level of Care Assessment Items TOOL 4 Quantity Score  - Use when only an EandM is performed on FOLLOW-UP visit 0 ASSESSMENTS - Nursing Assessment / Reassessment X -  Reassessment of Co-morbidities (includes updates in patient status) 1 10 X - Reassessment of Adherence to Treatment Plan 1 5 ASSESSMENTS - Wound and Skin Assessment / Reassessment  - Simple Wound Assessment / Reassessment - one wound 0  - Complex Wound Assessment / Reassessment - multiple wounds 0  - Dermatologic / Skin Assessment (not related to wound area) 0 ASSESSMENTS - Focused Assessment  - Circumferential Edema Measurements - multi extremities 0  - Nutritional Assessment / Counseling / Intervention 0  - Lower Extremity Assessment (monofilament, tuning fork, pulses) 0  - Peripheral Arterial Disease Assessment (using hand held doppler) 0 ASSESSMENTS - Ostomy and/or Continence Assessment and Care  - Incontinence Assessment and Management 0  - Ostomy Care Assessment and Management (repouching, etc.) 0 PROCESS - Coordination of Care X - Simple Patient / Family Education for ongoing care 1 15  - Complex (extensive) Patient / Family Education for ongoing care 0  - Staff obtains Chiropractor, Records, Test Results / Process Orders 0  - Staff telephones HHA, Nursing Homes / Clarify orders / etc 0  - Routine Transfer to another Facility (non-emergent condition) 0 Clutter, Sher ([id number])  - Routine Hospital Admission (non-emergent condition) 0  - New Admissions / Manufacturing engineer / Ordering NPWT, Apligraf, etc. 0  - Emergency Hospital Admission (emergent condition) 0  - Simple Discharge Coordination 0  - Complex (extensive) Discharge Coordination 0 PROCESS - Special Needs  - Pediatric / Minor Patient Management 0  - Isolation Patient Management 0  - Hearing / Language / Visual special needs 0  - Assessment of Community assistance (transportation, D/C planning, etc.) 0  - Additional assistance / Altered mentation 0  - Support Surface(s) Assessment (bed, cushion, seat, etc.) 0 INTERVENTIONS - Wound Cleansing / Measurement  - Simple  Wound Cleansing - one wound 0  - Complex Wound Cleansing - multiple wounds 0 X - Wound Imaging (photographs - any number of wounds) 1 5  -  Wound Tracing (instead of photographs) 0 []  - Simple Wound Measurement - one wound 0 []  - Complex Wound Measurement - multiple wounds 0 INTERVENTIONS - Wound Dressings X - Small Wound Dressing one or multiple wounds 1 10 []  - Medium Wound Dressing one or multiple wounds 0 []  - Large Wound Dressing one or multiple wounds 0 []  - Application of Medications - topical 0 []  - Application of Medications - injection 0 INTERVENTIONS - Miscellaneous []  - External ear exam 0 Woodlief, Juliocesar ([id number]) []  - Specimen Collection (cultures, biopsies, blood, body fluids, etc.) 0 []  - Specimen(s) / Culture(s) sent or taken to Lab for analysis 0 []  - Patient Transfer (multiple staff / Michiel Sites Lift / Similar devices) 0 []  - Simple Staple / Suture removal (25 or less) 0 []  - Complex Staple / Suture removal (26 or more) 0 []  - Hypo / Hyperglycemic Management (close monitor of Blood Glucose) 0 []  - Ankle / Brachial Index (ABI) - do not check if billed separately 0 X - Vital Signs 1 5 Has the patient been seen at the hospital within the last three years: Yes Total Score: 50 Level Of Care: New/Established - Level 2 Electronic Signature(s) Signed: 10/24/2015 1:29:10 PM By: Elpidio Eric BSN, RN Entered By: Elpidio Eric on 10/24/2015 13:29:10 [name] [name] ([id number]) -------------------------------------------------------------------------------- Encounter Discharge Information Details Patient Name: [name] [name] Date of Service: 10/24/2015 12:45 PM Medical Record Number: [id number] Patient Account Number: [id number] Date of Birth/Sex: 04-Mar-1986 (30 y.o. Male) Treating RN: Clover Mealy, RN, BSN, [place] Sink Primary Care Physician: Bethann Punches Other Clinician: Referring Physician: Bethann Punches Treating Physician/Extender: Altamese [place] in Treatment: 11 Encounter Discharge  Information Items Schedule Follow-up Appointment: No Medication Reconciliation completed No and provided to Patient/Care Suzann Lazaro: Provided on Clinical Summary of Care: 10/24/2015 Form Type Recipient Paper Patient ZI Electronic Signature(s) Signed: 10/24/2015 1:14:13 PM By: Gwenlyn Perking Entered By: Gwenlyn Perking on 10/24/2015 13:14:13 [name] [name] ([id number]) -------------------------------------------------------------------------------- General Visit Notes Details Patient Name: [name] [name] Date of Service: 10/24/2015 12:45 PM Medical Record Number: [id number] Patient Account Number: [id number] Date of Birth/Sex: 1985-11-20 (30 y.o. Male) Treating RN: Clover Mealy, RN, BSN, [place] Sink Primary Care Physician: Bethann Punches Other Clinician: Referring Physician: Bethann Punches Treating Physician/Extender: Maxwell Caul We[place] in Treatment: 11 Notes Bordered foam dressing applied for protection. Electronic Signature(s) Signed: 10/24/2015 1:30:37 PM By: Elpidio Eric BSN, RN Entered By: Elpidio Eric on 10/24/2015 13:30:36 [name] [name] ([id number]) -------------------------------------------------------------------------------- Lower Extremity Assessment Details Patient Name: [name] [name] Date of Service: 10/24/2015 12:45 PM Medical Record Number: [id number] Patient Account Number: [id number] Date of Birth/Sex: 1986/05/22 (30 y.o. Male) Treating RN: Clover Mealy, RN, BSN, [place] Sink Primary Care Physician: Bethann Punches Other Clinician: Referring Physician: Bethann Punches Treating Physician/Extender: Altamese [place] in Treatment: 11 Vascular Assessment Pulses: Posterior Tibial Dorsalis Pedis Palpable: [Left:Yes] Extremity colors, hair growth, and conditions: Extremity Color: [Left:Normal] Hair Growth on Extremity: [Left:Yes] Temperature of Extremity: [Left:Warm] Capillary Refill: [Left:< 3 seconds] Toe Nail Assessment Left: Right: Thick: No Discolored: No Deformed: No Improper Length and  Hygiene: No Electronic Signature(s) Signed: 10/24/2015 5:36:13 PM By: Elpidio Eric BSN, RN Entered By: Elpidio Eric on 10/24/2015 12:59:45 [name] [name] ([id number]) -------------------------------------------------------------------------------- Multi Wound Chart Details Patient Name: [name] [name] Date of Service: 10/24/2015 12:45 PM Medical Record Number: [id number] Patient Account Number: [id number] Date of Birth/Sex: [birth year] (30 y.o. Male) Treating RN: Clover Mealy, RN, BSN, Rita Primary Care Physician: Bethann Punches Other Clinician: Referring Physician: Bethann Punches Treating Physician/Extender: Maxwell Caul We[place] in Treatment: 11 Vital Signs Height(in): 72  Pulse(bpm): 74 Weight(lbs): 200 Blood Pressure 106/60 (mmHg): Body Mass Index(BMI): 27 Temperature(F): Respiratory Rate 17 (breaths/min): Photos: [1:No Photos] [N/A:N/A] Wound Location: [1:Left Calcaneus] [N/A:N/A] Wounding Event: [1:Gradually Appeared] [N/A:N/A] Primary Etiology: [1:Infection - not elsewhere N/A classified] Comorbid History: [1:Hypertension, Hepatitis C, N/A History of pressure wounds, Paraplegia] Date Acquired: [1:06/11/2015] [N/A:N/A] Weeks of Treatment: [1:11] [N/A:N/A] Wound Status: [1:Healed - Epithelialized] [N/A:N/A] Measurements L x W x D 0x0x0 [N/A:N/A] (cm) Area (cm) : [1:0] [N/A:N/A] Volume (cm) : [1:0] [N/A:N/A] % Reduction in Area: [1:100.00%] [N/A:N/A] % Reduction in Volume: 100.00% [N/A:N/A] Classification: [1:Full Thickness Without Exposed Support Structures] [N/A:N/A] Exudate Amount: [1:None Present] [N/A:N/A] Wound Margin: [1:Distinct, outline attached N/A] Granulation Amount: [1:None Present (0%)] [N/A:N/A] Necrotic Amount: [1:None Present (0%)] [N/A:N/A] Exposed Structures: [1:Fascia: No Fat: No Tendon: No Muscle: No Joint: No Bone: No] [N/A:N/A] Limited to Skin Breakdown Epithelialization: Large (67-100%) N/A N/A Periwound Skin Texture: Edema: No N/A N/A Excoriation:  No Induration: No Callus: No Crepitus: No Fluctuance: No Friable: No Rash: No Scarring: No Periwound Skin Maceration: No N/A N/A Moisture: Moist: No Dry/Scaly: No Periwound Skin Color: Atrophie Blanche: No N/A N/A Cyanosis: No Ecchymosis: No Erythema: No Hemosiderin Staining: No Mottled: No Pallor: No Rubor: No Temperature: No Abnormality N/A N/A Tenderness on No N/A N/A Palpation: Wound Preparation: Ulcer Cleansing: N/A N/A Rinsed/Irrigated with Saline Topical Anesthetic Applied: None Treatment Notes Electronic Signature(s) Signed: 10/24/2015 1:27:35 PM By: Elpidio EricAfful, Rita BSN, RN Entered By: Elpidio EricAfful, Rita on 10/24/2015 13:27:34 [name] HelperIRBY, Odell ([id number]) -------------------------------------------------------------------------------- Pain Assessment Details Patient Name: [name] HelperIRBY, Farah Date of Service: 10/24/2015 12:45 PM Medical Record Number: [id number] Patient Account Number: [id number] Date of Birth/Sex: 11/26/1985 [age] y.o. Male) Treating RN: Clover MealyAfful, RN, BSN, Rita Primary Care Physician: Bethann PunchesMiller, Mark Other Clinician: Referring Physician: Bethann PunchesMiller, Mark Treating Physician/Extender: Altamese CarolinaOBSON, MICHAEL G We[place] in Treatment: 11 Active Problems Location of Pain Severity and Description of Pain Patient Has Paino No Site Locations Pain Management and Medication Current Pain Management: Electronic Signature(s) Signed: 10/24/2015 12:49:36 PM By: Elpidio EricAfful, Rita BSN, RN Entered By: Elpidio EricAfful, Rita on 10/24/2015 12:49:36 [name] HelperIRBY, Jaree ([id number]) -------------------------------------------------------------------------------- Wound Assessment Details Patient Name: [name] HelperIRBY, Tymier Date of Service: 10/24/2015 12:45 PM Medical Record Number: [id number] Patient Account Number: [id number] Date of Birth/Sex: 12/05/1985 (30 y.o. Male) Treating RN: Clover MealyAfful, RN, BSN, Rita Primary Care Physician: Bethann PunchesMiller, Mark Other Clinician: Referring Physician: Bethann PunchesMiller, Mark Treating Physician/Extender: Maxwell CaulOBSON,  MICHAEL G Weeks in Treatment: 11 Wound Status Wound Number: 1 Primary Infection - not elsewhere classified Etiology: Wound Location: Left Calcaneus Wound Healed - Epithelialized Wounding Event: Gradually Appeared Status: Date Acquired: 06/11/2015 Comorbid Hypertension, Hepatitis C, History of Weeks Of Treatment: 11 History: pressure wounds, Paraplegia Clustered Wound: No Photos Photo Uploaded By: Elpidio EricAfful, Rita on 10/24/2015 17:29:23 Wound Measurements Length: (cm) 0 % Reduction i Width: (cm) 0 % Reduction i Depth: (cm) 0 Epithelializa Area: (cm) 0 Volume: (cm) 0 n Area: 100% n Volume: 100% tion: Large (67-100%) Wound Description Full Thickness Without Exposed Classification: Support Structures Wound Margin: Distinct, outline attached Exudate None Present Amount: Foul Odor After Cleansing: No Wound Bed Granulation Amount: None Present (0%) Exposed Structure Necrotic Amount: None Present (0%) Fascia Exposed: No Fat Layer Exposed: No Tendon Exposed: No Muscle Exposed: No Sainato, Earna CoderZACHARY ([id number]) Joint Exposed: No Bone Exposed: No Limited to Skin Breakdown Periwound Skin Texture Texture Color No Abnormalities Noted: No No Abnormalities Noted: No Callus: No Atrophie Blanche: No Crepitus: No Cyanosis: No Excoriation: No Ecchymosis: No Fluctuance: No Erythema: No Friable: No Hemosiderin Staining: No Induration: No Mottled: No  Localized Edema: No Pallor: No Rash: No Rubor: No Scarring: No Temperature / Pain Moisture Temperature: No Abnormality No Abnormalities Noted: No Dry / Scaly: No Maceration: No Moist: No Wound Preparation Ulcer Cleansing: Rinsed/Irrigated with Saline Topical Anesthetic Applied: None Electronic Signature(s) Signed: 10/24/2015 5:36:13 PM By: Elpidio Eric BSN, RN Entered By: Elpidio Eric on 10/24/2015 13:13:05 [name] [name] ([id number]) -------------------------------------------------------------------------------- Vitals  Details Patient Name: [name] [name] Date of Service: 10/24/2015 12:45 PM Medical Record Number: [id number] Patient Account Number: [id number] Date of Birth/Sex: [birth year] (30 y.o. Male) Treating RN: Clover Mealy, RN, BSN, Rita Primary Care Physician: Bethann Punches Other Clinician: Referring Physician: Bethann Punches Treating Physician/Extender: Altamese [place] in Treatment: 11 Vital Signs Time Taken: 12:55 Pulse (bpm): 74 Height (in): 72 Respiratory Rate (breaths/min): 17 Weight (lbs): 200 Blood Pressure (mmHg): 106/60 Body Mass Index (BMI): 27.1 Reference Range: 80 - 120 mg / dl Notes Refuses temp Electronic Signature(s) Signed: 10/24/2015 5:36:13 PM By: Elpidio Eric BSN, RN Entered By: Elpidio Eric on 10/24/2015 12:55:25

## 2015-10-25 ENCOUNTER — Ambulatory Visit: Payer: 59

## 2015-10-25 NOTE — Progress Notes (Signed)
BUNNY, LOWDERMILK ([id number]) Visit Report for 10/24/2015 Chief Complaint Document Details Patient Name: [name] [name], [name] [name] Date of Service: 10/24/2015 12:45 PM Medical Record Number: [id number] Patient Account Number: [id number] Date of Birth/Sex: [birth year] (29 y.o. Male) Treating RN: Clover Mealy, RN, BSN, [place] Sink Primary Care Physician: Bethann Punches Other Clinician: Referring Physician: Bethann Punches Treating Physician/Extender: Altamese [place] in Treatment: 11 Information Obtained from: Caregiver Chief Complaint Patient is at the clinic for treatment of an open pressure ulcer to his left heel which she's had for about 4-5 months Electronic Signature(s) Signed: 10/24/2015 6:33:23 PM By: Baltazar Najjar MD Entered By: Baltazar Najjar on 10/24/2015 13:16:10 [name] [name] ([id number]) -------------------------------------------------------------------------------- HPI Details Patient Name: [name] [name] Date of Service: 10/24/2015 12:45 PM Medical Record Number: [id number] Patient Account Number: [id number] Date of Birth/Sex: September 11, 1985 (29 y.o. Male) Treating RN: Clover Mealy, RN, BSN,  Sink Primary Care Physician: Bethann Punches Other Clinician: Referring Physician: Bethann Punches Treating Physician/Extender: Maxwell Caul [place] in Treatment: 11 History of Present Illness Location: left heel Quality: Patient reports No Pain. Severity: Patient states wound are getting worse. Duration: Patient has had the wound for > 3 months prior to seeking treatment at the wound center Context: The wound appeared gradually over time Modifying Factors: Other treatment(s) tried include:local care with some silver and zinc paste HPI Description: 30 year old male who has a history of traumatic brain injury and also has a ulcer on his left heel which was noted by the family about 4-5 months ago. The TBI was in January 2013 and prior to this visit the patient has had hyperbaric oxygen therapy approximately 2 years after his  TBI. He is had several gastrostomy tube placed by the gastroenterologist but then finally had a surgical procedure to bring the gastric mucosa to the skin surface. This was done approximately 9 months ago. Past medical history of hepatitis C, traumatic brain injury, pulmonary emboli, ileostomy, tracheostomy, PEG tube placement. Was a former smoker and quit in January 2013 09/05/15; this is a 30 year old man who has a history of traumatic brain injury. He has fed through a PEG tube. He has a pressure ulcer on the left heel which he had for many months. Currently they've been using Santyl to the heel wound which had a surface eschar. There are returning today in follow-up 09/12/15; I saw this patient last week for the first time. He has a pressure ulcer on the left heel which she had had for many months. He was using Santyl and didn't have any product. We divided the wound and changed him to Silver collagen which apparently arrived at his house the next day or/21/17; the patient's wound is roughly half the size of last week currently at 0.6 x 0.5 x 0.1. We have been using Silver collagen for the last 2 weeks. He arrives accompanied by a caregiver. There are no new issues reported 09/26/15; he continues to have improvement over the pressure ulcer on the left Achilles area of his left heel. This is a healthy-looking wound no surface debridement is needed. We'll use Santyl but changed to Silver collagen. Seems to be making good progress and there is epithelialization without evidence of infection all pressure off loading issues have been dealt with at home 10/10/15 the patient arrived today with really no open area over the original wound and the Achilles area of his left heel. However he did have some purple/black discoloration in the wound bed and around. There was no obvious tenderness here no reason to suggest this is an infection.  Best guess here with the subdermal hemorrhage. 10/24/15; once again  the patient arrives today with no wound over the Achilles area of his left heel. He does continue to have some purplish discoloration here. I'm really not certain how to explain this. Also is notable that the skin is directly over bone here certainly at risk of recurrence although his family/caregiver's are doing at her [employer] job been offloading this area Electronic Signature(s) [name] [name], [name] [name] ([id number]) Signed: 10/24/2015 6:33:23 PM By: Baltazar Najjar MD Entered By: Baltazar Najjar on 10/24/2015 13:18:43 [name] [name] ([id number]) -------------------------------------------------------------------------------- Physical Exam Details Patient Name: [name] [name] Date of Service: 10/24/2015 12:45 PM Medical Record Number: [id number] Patient Account Number: [id number] Date of Birth/Sex: [birth year] (29 y.o. Male) Treating RN: Clover Mealy, RN, BSN, [place] Sink Primary Care Physician: Bethann Punches Other Clinician: Referring Physician: Bethann Punches Treating Physician/Extender: Altamese [place] in Treatment: 11 Notes Wound exam the area on the Achilles aspect of his heel is fully epithelialized there is no open wound continued discoloration is noted in the HPI. No tenderness no evidence of infection Electronic Signature(s) Signed: 10/24/2015 6:33:23 PM By: Baltazar Najjar MD Entered By: Baltazar Najjar on 10/24/2015 13:19:30 [name] [name] ([id number]) -------------------------------------------------------------------------------- Physician Orders Details Patient Name: [name] [name] Date of Service: 10/24/2015 12:45 PM Medical Record Number: [id number] Patient Account Number: [id number] Date of Birth/Sex: Sep 12, 1985 (29 y.o. Male) Treating RN: Clover Mealy, RN, BSN, [place] Sink Primary Care Physician: Bethann Punches Other Clinician: Referring Physician: Bethann Punches Treating Physician/Extender: Altamese [place] in Treatment: 11 Verbal / Phone Orders: Yes Clinician: Afful, RN, BSN, Rita Read Back and  Verified: Yes Diagnosis Coding Discharge From [facility] Services o Discharge from Wound Care Center - treatment completed Electronic Signature(s) Signed: 10/24/2015 5:36:13 PM By: Elpidio Eric BSN, RN Signed: 10/24/2015 6:33:23 PM By: Baltazar Najjar MD Entered By: Elpidio Eric on 10/24/2015 13:14:16 [name] [name] ([id number]) -------------------------------------------------------------------------------- Problem List Details Patient Name: [name] [name] Date of Service: 10/24/2015 12:45 PM Medical Record Number: [id number] Patient Account Number: [id number] Date of Birth/Sex: Jul 07, 1986 (29 y.o. Male) Treating RN: Clover Mealy, RN, BSN, [place] Sink Primary Care Physician: Bethann Punches Other Clinician: Referring Physician: Bethann Punches Treating Physician/Extender: Maxwell Caul [place] in Treatment: 11 Active Problems ICD-10 Encounter Code Description Active Date Diagnosis [phone] Pressure ulcer of left heel, stage 2 08/08/2015 Yes Z87.820 Personal history of traumatic brain injury 08/08/2015 Yes Inactive Problems Resolved Problems Electronic Signature(s) Signed: 10/24/2015 6:33:23 PM By: Baltazar Najjar MD Entered By: Baltazar Najjar on 10/24/2015 13:15:53 [name] [name] ([id number]) -------------------------------------------------------------------------------- Progress Note Details Patient Name: [name] [name] Date of Service: 10/24/2015 12:45 PM Medical Record Number: [id number] Patient Account Number: [id number] Date of Birth/Sex: 1986/04/30 (29 y.o. Male) Treating RN: Clover Mealy, RN, BSN,  Sink Primary Care Physician: Bethann Punches Other Clinician: Referring Physician: Bethann Punches Treating Physician/Extender: Altamese  in Treatment: 11 Subjective Chief Complaint Information obtained from Caregiver Patient is at the clinic for treatment of an open pressure ulcer to his left heel which she's had for about 4-5 months History of Present Illness (HPI) The following HPI elements were  documented for the patient's wound: Location: left heel Quality: Patient reports No Pain. Severity: Patient states wound are getting worse. Duration: Patient has had the wound for > 3 months prior to seeking treatment at the wound center Context: The wound appeared gradually over time Modifying Factors: Other treatment(s) tried include:local care with some silver and zinc paste 30 year old male who has a history of traumatic brain injury and also has a ulcer on his left heel which was  noted by the family about 4-5 months ago. The TBI was in January 2013 and prior to this visit the patient has had hyperbaric oxygen therapy approximately 2 years after his TBI. He is had several gastrostomy tube placed by the gastroenterologist but then finally had a surgical procedure to bring the gastric mucosa to the skin surface. This was done approximately 9 months ago. Past medical history of hepatitis C, traumatic brain injury, pulmonary emboli, ileostomy, tracheostomy, PEG tube placement. Was a former smoker and quit in January 2013 09/05/15; this is a 30 year old man who has a history of traumatic brain injury. He has fed through a PEG tube. He has a pressure ulcer on the left heel which he had for many months. Currently they've been using Santyl to the heel wound which had a surface eschar. There are returning today in follow-up 09/12/15; I saw this patient last week for the first time. He has a pressure ulcer on the left heel which she had had for many months. He was using Santyl and didn't have any product. We divided the wound and changed him to Silver collagen which apparently arrived at his house the next day or/21/17; the patient's wound is roughly half the size of last week currently at 0.6 x 0.5 x 0.1. We have been using Silver collagen for the last 2 weeks. He arrives accompanied by a caregiver. There are no new issues reported 09/26/15; he continues to have improvement over the pressure ulcer on  the left Achilles area of his left heel. This is a healthy-looking wound no surface debridement is needed. We'll use Santyl but changed to Silver collagen. Seems to be making good progress and there is epithelialization without evidence of infection all pressure off loading issues have been dealt with at home 10/10/15 the patient arrived today with really no open area over the original wound and the Achilles area of his left heel. However he did have some purple/black discoloration in the wound bed and around. There [name] [name], [name] [name] ([id number]) was no obvious tenderness here no reason to suggest this is an infection. Best guess here with the subdermal hemorrhage. 10/24/15; once again the patient arrives today with no wound over the Achilles area of his left heel. He does continue to have some purplish discoloration here. I'm really not certain how to explain this. Also is notable that the skin is directly over bone here certainly at risk of recurrence although his family/caregiver's are doing at her [employer] job been offloading this area Objective Constitutional Vitals Time Taken: 12:55 PM, Height: 72 in, Weight: 200 lbs, BMI: 27.1, Pulse: 74 bpm, Respiratory Rate: 17 breaths/min, Blood Pressure: 106/60 mmHg. General Notes: Refuses temp Integumentary (Hair, Skin) Wound #1 status is Healed - Epithelialized. Original cause of wound was Gradually Appeared. The wound is located on the Left Calcaneus. The wound measures 0cm length x 0cm width x 0cm depth; 0cm^2 area and 0cm^3 volume. The wound is limited to skin breakdown. There is a none present amount of drainage noted. The wound margin is distinct with the outline attached to the wound base. There is no granulation within the wound bed. There is no necrotic tissue within the wound bed. The periwound skin appearance did not exhibit: Callus, Crepitus, Excoriation, Fluctuance, Friable, Induration, Localized Edema, Rash, Scarring, Dry/Scaly, Maceration,  Moist, Atrophie Blanche, Cyanosis, Ecchymosis, Hemosiderin Staining, Mottled, Pallor, Rubor, Erythema. Periwound temperature was noted as No Abnormality. Assessment Active Problems ICD-10 J19.147L89.622 - Pressure ulcer of left heel, stage 2 Z87.820 - Personal  history of traumatic brain injury Plan Discharge From [facility] Services: [name] [name], [name] [name] ([id number]) Discharge from Wound Care Center - treatment completed #1 the patient no longer has an open wound but is certainly at risk #2 continue aggressive offloading of this area as they're already doing. They have a Bunny boot in place at all times #3 there were questions raised I think from the patient's father over the phone and also questions are answered from his caregiver apparently brought up by his mother about off label use of hyperbaric oxygen in this case. I informed them that under no circumstances physiologic slough off label HBO than I am aware of #4 the patient can be discharged from the clinic Electronic Signature(s) Signed: 10/24/2015 6:33:23 PM By: Baltazar Najjar MD Entered By: Baltazar Najjar on 10/24/2015 13:21:29 [name] [name] ([id number]) -------------------------------------------------------------------------------- SuperBill Details Patient Name: [name] [name] Date of Service: 10/24/2015 Medical Record Number: [id number] Patient Account Number: [id number] Date of Birth/Sex: June 30, 1986 (29 y.o. Male) Treating RN: Clover Mealy, RN, BSN, [place] Sink Primary Care Physician: Bethann Punches Other Clinician: Referring Physician: Bethann Punches Treating Physician/Extender: Maxwell Caul We[place] in Treatment: 11 Diagnosis Coding ICD-10 Codes Code Description [phone] Pressure ulcer of left heel, stage 2 Z87.820 Personal history of traumatic brain injury Facility Procedures CPT4 Code: [id number] Description: [phone] - WOUND CARE VISIT-LEV 2 EST PT Modifier: Quantity: 1 Physician Procedures CPT4 Code: 4401027 Description: 99212 - WC PHYS LEVEL 2 -  EST PT ICD-10 Description Diagnosis L89.622 Pressure ulcer of left heel, stage 2 Modifier: Quantity: 1 Electronic Signature(s) Signed: 10/24/2015 1:31:33 PM By: Elpidio Eric BSN, RN Signed: 10/24/2015 6:33:23 PM By: Baltazar Najjar MD Entered By: Elpidio Eric on 10/24/2015 13:31:33

## 2015-10-26 ENCOUNTER — Ambulatory Visit: Payer: 59 | Admitting: Physical Therapy

## 2015-11-03 ENCOUNTER — Ambulatory Visit: Payer: 59 | Admitting: Physical Therapy

## 2015-11-03 ENCOUNTER — Encounter: Payer: 59 | Admitting: Occupational Therapy

## 2015-11-08 ENCOUNTER — Ambulatory Visit: Payer: 59 | Admitting: Physical Therapy

## 2015-11-17 ENCOUNTER — Encounter: Payer: 59 | Admitting: Occupational Therapy

## 2015-11-17 ENCOUNTER — Ambulatory Visit: Payer: 59 | Admitting: Physical Therapy

## 2015-11-21 ENCOUNTER — Other Ambulatory Visit: Payer: Self-pay | Admitting: *Deleted

## 2015-11-21 MED ORDER — ALBUTEROL SULFATE (2.5 MG/3ML) 0.083% IN NEBU: 3.0000 mL | INHALATION_SOLUTION | Freq: Three times a day (TID) | RESPIRATORY_TRACT | Status: DC

## 2015-11-24 ENCOUNTER — Encounter: Payer: 59 | Admitting: Occupational Therapy

## 2015-11-24 ENCOUNTER — Ambulatory Visit: Payer: 59 | Admitting: Physical Therapy

## 2015-12-14 ENCOUNTER — Ambulatory Visit (INDEPENDENT_AMBULATORY_CARE_PROVIDER_SITE_OTHER): Payer: 59 | Admitting: Internal Medicine

## 2015-12-14 ENCOUNTER — Encounter: Payer: Self-pay | Admitting: Internal Medicine

## 2015-12-14 VITALS — BP 122/72 | HR 71 | Ht 71.0 in | Wt 221.0 lb

## 2015-12-14 DIAGNOSIS — J209 Acute bronchitis, unspecified: Secondary | ICD-10-CM | POA: Diagnosis not present

## 2015-12-14 DIAGNOSIS — G8250 Quadriplegia, unspecified: Secondary | ICD-10-CM

## 2015-12-14 NOTE — Progress Notes (Signed)
[facility]     Assessment and Plan:  Pre-op Pulmonary examination.  --The patient is at moderate risk of pulmonary complications with colonoscopy. This can be reduced by ensuring that his breathing is at baseline before the surgery. Should he experience a chest cold or other respiratory difficulty before the surgery, the procedure should be postponed.   Chronic bronchitis. Recurrent episodes of bronchitis and pneumonia, likely due to chronic debility and poor airway clearance.  Dysphagia. -Has G-tube in place.  Functional quadriplegia area  History of pulmonary Melissa. -Status post IVC filter, on anticoagulation.   Date: 12/14/2015  MRN# [id number] [name] [name] Dec 04, 1985   [name] [name] is a 30 y.o. old male seen in follow up for chief complaint of  Chief Complaint  Patient presents with  . Follow-up    pulm clearance for colonscopy. c/o occ sob & wheezing.     HPI:  History is via a CNA today; who works with him frequently. The both of them note that he breathing has been ok. He has occasional emotional outbursts where yells and cusses. Occasionally they last for an hour or more and then he will get winded, and he "looks like a ran a marathon"-- then he will get an albuterol treatment and will be ok.    Review of chest x-ray images from 10/04/2015: Reduced pulmonary due to inadequate inspiration. Otherwise unremarkable.  Medication:   Outpatient Encounter Prescriptions as of 12/14/2015  Medication Sig  . acidophilus (RISAQUAD) CAPS capsule Take 1 capsule by mouth 2 (two) times daily.  Marland Kitchen albuterol (PROVENTIL) (2.5 MG/3ML) 0.083% nebulizer solution Inhale 3 mLs into the lungs 3 (three) times daily.  Marland Kitchen apixaban (ELIQUIS) 5 MG TABS tablet Take 5 mg by mouth 3 (three) times daily.   . Cranberry 1000 MG CAPS Take 1,000 mg by mouth daily.   . fidaxomicin (DIFICID) 200 MG TABS tablet Take 1 tablet (200 mg total) by mouth 2 (two) times daily.  Marland Kitchen gabapentin  (NEURONTIN) 300 MG capsule Take 900 mg by mouth 3 (three) times daily.   . indomethacin (INDOCIN SR) 75 MG CR capsule Take 75 mg by mouth daily.  Marland Kitchen lacosamide (VIMPAT) 200 MG TABS tablet Take 200 mg by mouth 2 (two) times daily.  . Multiple Vitamins-Minerals (MULTIVITAMIN WITH MINERALS) tablet Take 1 tablet by mouth daily.  . Nutritional Supplements (FEEDING SUPPLEMENT, JEVITY 1.5 CAL/FIBER,) LIQD Place 237 mLs into feeding tube 5 (five) times daily.  Marland Kitchen nystatin (MYCOSTATIN/NYSTOP) 100000 UNIT/GM POWD Apply 1 g topically 2 (two) times daily.   Marland Kitchen OLANZapine (ZYPREXA) 15 MG tablet Take 7.5 mg by mouth at bedtime.   Marland Kitchen oxyCODONE (ROXICODONE) 5 MG/5ML solution Place 5 mLs (5 mg total) into feeding tube every 6 (six) hours as needed for breakthrough pain.  Marland Kitchen perphenazine (TRILAFON) 4 MG tablet Take 4 mg by mouth 2 (two) times daily.  . propranolol ER (INDERAL LA) 60 MG 24 hr capsule Take 1 capsule (60 mg total) by mouth daily.  Marland Kitchen Respiratory Therapy Supplies (FLUTTER) DEVI Use as directed  . risedronate (ACTONEL) 35 MG tablet Take 35 mg by mouth every 7 (seven) days. with water on empty stomach, nothing by mouth or lie down for next 30 minutes. Take on Saturday.  . Water For Irrigation, Sterile (FREE WATER) SOLN Place 300 mLs into feeding tube every 6 (six) hours.   No facility-administered encounter medications on file as of 12/14/2015.     Allergies:  Ambien; Ativan; Depakote er; Dilaudid; and Keppra  Review of Systems: Gen:  Denies  fever, sweats. HEENT: Denies blurred vision. Cvc:  No dizziness, chest pain or heaviness Resp:   Denies cough or sputum porduction. Gi: Denies swallowing difficulty, stomach pain.  Gu:  Denies bladder incontinence, burning urine Ext:   No Joint pain, stiffness. Skin: No skin rash, easy bruising. Endoc:  No polyuria, polydipsia. Psych: No depression, insomnia. Other:  All other systems were reviewed and found to be negative other than what is mentioned in the  HPI.   Physical Examination:   VS: BP 122/72 mmHg  Pulse 71  Ht 5\' 11"  (1.803 m)  Wt 221 lb (100.245 kg)  BMI 30.84 kg/m2  SpO2 91%  General Appearance: No distress  Neuro:wheelchair bound. Moves right hand. speech slurred.  HEENT: PERRLA, EOM intact. Pulmonary: normal breath sounds, No wheezing.   CardiovascularNormal S1,S2.  No m/r/g.   Abdomen: Benign, Soft, non-tender. Renal:  No costovertebral tenderness  GU:  Not performed at this time. Endoc: No evident thyromegaly, no signs of acromegaly. Skin:   warm, no rash. Extremities: normal, no cyanosis, clubbing.   LABORATORY PANEL:   CBC No results for input(s): WBC, HGB, HCT, PLT in the last 168 hours. ------------------------------------------------------------------------------------------------------------------  Chemistries  No results for input(s): NA, K, CL, CO2, GLUCOSE, BUN, CREATININE, CALCIUM, MG, AST, ALT, ALKPHOS, BILITOT in the last 168 hours.  Invalid input(s): GFRCGP ------------------------------------------------------------------------------------------------------------------  Cardiac Enzymes No results for input(s): TROPONINI in the last 168 hours. ------------------------------------------------------------  RADIOLOGY:   No results found for this or any previous visit. Results for orders placed during the hospital encounter of 10/04/15  DG Chest 2 View   Narrative CLINICAL DATA:  Decreasing oxygenation levels and bradycardia at rest. Shortness of breath.  EXAM: CHEST  2 VIEW  COMPARISON:  07/29/2015  FINDINGS: Shallow inspiration. Heart size and pulmonary vascularity are normal. No focal consolidation in the lungs. No blunting of costophrenic angles. No pneumothorax.  IMPRESSION: Shallow inspiration.  No evidence of active pulmonary disease.   Electronically Signed   By: Burman NievesWilliam  Stevens M.D.   On: 10/04/2015 21:07     ------------------------------------------------------------------------------------------------------------------  Thank  you for allowing [facility] to assist in the care of your patient. Our recommendations are noted above.  Please contact us if we can be of further service.   Wells Guileseep Milos Milligan, MD.  [facility] Office Number: [phone]  Santiago Gladavid Kasa, M.D.  Stephanie AcreVishal Mungal, M.D.  Billy Fischeravid Simonds, M.D  12/14/2015

## 2015-12-14 NOTE — Patient Instructions (Signed)
The patient is at moderate risk of pulmonary complications with colonoscopy. This can be reduced by ensuring that his breathing is at baseline before the surgery. Should he experience a chest cold or other respiratory difficulty before the surgery, the procedure should be postponed.

## 2015-12-28 ENCOUNTER — Encounter: Payer: Self-pay | Admitting: *Deleted

## 2015-12-29 ENCOUNTER — Ambulatory Visit: Payer: Commercial Managed Care - HMO | Admitting: Certified Registered"

## 2015-12-29 ENCOUNTER — Encounter
Admission: RE | Disposition: A | Discharge status: Home / Self Care | Payer: Self-pay | Source: Ambulatory Visit | Attending: Unknown Physician Specialty

## 2015-12-29 ENCOUNTER — Ambulatory Visit
Admission: RE | Admit: 2015-12-29 | Discharge: 2015-12-29 | Disposition: A | Discharge status: Home / Self Care | Payer: Commercial Managed Care - HMO | Source: Ambulatory Visit | Attending: Unknown Physician Specialty | Admitting: Unknown Physician Specialty

## 2015-12-29 DIAGNOSIS — Z931 Gastrostomy status: Secondary | ICD-10-CM | POA: Insufficient documentation

## 2015-12-29 DIAGNOSIS — M419 Scoliosis, unspecified: Secondary | ICD-10-CM | POA: Insufficient documentation

## 2015-12-29 DIAGNOSIS — Z888 Allergy status to other drugs, medicaments and biological substances status: Secondary | ICD-10-CM | POA: Diagnosis not present

## 2015-12-29 DIAGNOSIS — Z87891 Personal history of nicotine dependence: Secondary | ICD-10-CM | POA: Insufficient documentation

## 2015-12-29 DIAGNOSIS — Z8349 Family history of other endocrine, nutritional and metabolic diseases: Secondary | ICD-10-CM | POA: Diagnosis not present

## 2015-12-29 DIAGNOSIS — Z7901 Long term (current) use of anticoagulants: Secondary | ICD-10-CM | POA: Insufficient documentation

## 2015-12-29 DIAGNOSIS — K625 Hemorrhage of anus and rectum: Secondary | ICD-10-CM | POA: Diagnosis present

## 2015-12-29 DIAGNOSIS — Z79899 Other long term (current) drug therapy: Secondary | ICD-10-CM | POA: Diagnosis not present

## 2015-12-29 DIAGNOSIS — F329 Major depressive disorder, single episode, unspecified: Secondary | ICD-10-CM | POA: Diagnosis not present

## 2015-12-29 DIAGNOSIS — B1920 Unspecified viral hepatitis C without hepatic coma: Secondary | ICD-10-CM | POA: Insufficient documentation

## 2015-12-29 DIAGNOSIS — Z86711 Personal history of pulmonary embolism: Secondary | ICD-10-CM | POA: Diagnosis not present

## 2015-12-29 DIAGNOSIS — K640 First degree hemorrhoids: Secondary | ICD-10-CM | POA: Insufficient documentation

## 2015-12-29 DIAGNOSIS — Z87820 Personal history of traumatic brain injury: Secondary | ICD-10-CM | POA: Diagnosis not present

## 2015-12-29 HISTORY — DX: Anemia, unspecified: D64.9

## 2015-12-29 HISTORY — PX: COLONOSCOPY WITH PROPOFOL: SHX5780

## 2015-12-29 HISTORY — DX: Anxiety disorder, unspecified: F41.9

## 2015-12-29 HISTORY — DX: Unspecified convulsions: R56.9

## 2015-12-29 HISTORY — DX: Other psychoactive substance abuse, uncomplicated: F19.10

## 2015-12-29 SURGERY — COLONOSCOPY WITH PROPOFOL
Anesthesia: General

## 2015-12-29 MED ORDER — SODIUM CHLORIDE 0.9 % IV SOLN: INTRAVENOUS | Status: DC

## 2015-12-29 MED ORDER — PROPOFOL 10 MG/ML IV BOLUS
INTRAVENOUS | Status: DC | PRN
  Administered 2015-12-29: 50 mg via INTRAVENOUS
  Administered 2015-12-29: 20 mg via INTRAVENOUS

## 2015-12-29 MED ORDER — MIDAZOLAM HCL 5 MG/5ML IJ SOLN
INTRAMUSCULAR | Status: DC | PRN
  Administered 2015-12-29: 1 mg via INTRAVENOUS

## 2015-12-29 MED ORDER — LIDOCAINE 2% (20 MG/ML) 5 ML SYRINGE
INTRAMUSCULAR | Status: DC | PRN
  Administered 2015-12-29: 50 mg via INTRAVENOUS

## 2015-12-29 MED ORDER — SODIUM CHLORIDE 0.9 % IV SOLN
INTRAVENOUS | Status: DC
  Administered 2015-12-29: 1000 mL via INTRAVENOUS

## 2015-12-29 MED ORDER — PROPOFOL 500 MG/50ML IV EMUL
INTRAVENOUS | Status: DC | PRN
  Administered 2015-12-29: 75 ug/kg/min via INTRAVENOUS

## 2015-12-29 NOTE — Anesthesia Preprocedure Evaluation (Addendum)
Anesthesia Evaluation  Patient identified by MRN, date of birth, ID band Patient awake    Reviewed: Allergy & Precautions, NPO status , Patient's Chart, lab work & pertinent test results, reviewed documented beta blocker date and time   Airway Mallampati: III  TM Distance: >3 FB     Dental  (+) Chipped   Pulmonary sleep apnea , pneumonia, resolved, former smoker,           Cardiovascular hypertension, Pt. on home beta blockers and Pt. on medications      Neuro/Psych Seizures -,  PSYCHIATRIC DISORDERS Anxiety Depression    GI/Hepatic (+) Hepatitis -, C  Endo/Other    Renal/GU      Musculoskeletal   Abdominal   Peds  Hematology  (+) anemia ,   Anesthesia Other Findings Obese. Hx of TBI. Quadroplegia. Hx of PE. Sc[place]osis[place]  Reproductive/Obstetrics                            Anesthesia Physical Anesthesia Plan  ASA: IV  Anesthesia Plan: General   Post-op Pain Management:    Induction: Intravenous  Airway Management Planned: Nasal Cannula  Additional Equipment:   Intra-op Plan:   Post-operative Plan:   Informed Consent: I have reviewed the patients History and Physical, chart, labs and discussed the procedure including the risks, benefits and alternatives for the proposed anesthesia with the patient or authorized representative who has indicated his/her understanding and acceptance.     Plan Discussed with: CRNA  Anesthesia Plan Comments:         Anesthesia Quick Evaluation

## 2015-12-29 NOTE — Anesthesia Postprocedure Evaluation (Signed)
Anesthesia Post Note  Patient: [name] [name]  Procedure(s) Performed: Procedure(s) (LRB): COLONOSCOPY WITH PROPOFOL (N/A)  Patient location during evaluation: Endoscopy Anesthesia Type: General Level of consciousness: awake and alert Pain management: pain level controlled Vital Signs Assessment: post-procedure vital signs reviewed and stable Respiratory status: spontaneous breathing, nonlabored ventilation, respiratory function stable and patient connected to nasal cannula oxygen Cardiovascular status: blood pressure returned to baseline and stable Postop Assessment: no signs of nausea or vomiting Anesthetic complications: no    Last Vitals:  Filed Vitals:   12/29/15 1040 12/29/15 1100  BP: 127/73 122/85  Pulse:    Temp:    Resp:      Last Pain: There were no vitals filed for this visit.               Shakiyah Cirilo S
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
## 2015-12-29 NOTE — Op Note (Signed)
[facility] Gastroenterology Patient Name: [name] [name] Procedure Date: 12/29/2015 9:46 AM MRN: [id number] Account #: [id number] Date of Birth: [birth year] Admit Type: Outpatient Age: 30 Room: [facility] ENDO ROOM 4 Gender: Male Note Status: Finalized Procedure:            Colonoscopy Indications:          Rectal bleeding Providers:            Scot Jun, MD Referring MD:         [name], MD (Referring MD) Medicines:            Propofol per Anesthesia Complications:        No immediate complications. Procedure:            Pre-Anesthesia Assessment:                       - After reviewing the risks and benefits, the patient                        was deemed in satisfactory condition to undergo the                        procedure.                       After obtaining informed consent, the colonoscope was                        passed under direct vision. Throughout the procedure,                        the patient's blood pressure, pulse, and oxygen                        saturations were monitored continuously. The                        Colonoscope was introduced through the anus and                        advanced to the the cecum, identified by appendiceal                        orifice and ileocecal valve. The colonoscopy was                        somewhat difficult. The patient tolerated the procedure                        well. The quality of the bowel preparation was adequate                        to identify polyps 6 mm and larger in size. Findings:      Internal hemorrhoids were found during endoscopy. The hemorrhoids were       small and Grade I (internal hemorrhoids that do not prolapse). Scattered       amountts of stool both liquid and small pieces, much was suctioned 1100       cc to visualize the lining. the colon was long. The mucosa was normal       throughout the  entire colon. No areas of colitis or bleeeding were seen       anywhere.  90 percent was seen well and the remaining areas were seen       enough to rule out polyps. Impression:           - Internal hemorrhoids.                       - No specimens collected. Recommendation:       - The findings and recommendations were discussed with                        the patient's family. Scot Junobert T Dezire Turk, MD 12/29/2015 10:22:08 AM This report has been signed electronically. Number of Addenda: 0 Note Initiated On: 12/29/2015 9:46 AM Scope Withdrawal Time: 0 hours 17 minutes 33 seconds  Total Procedure Duration: 0 hours 23 minutes 36 seconds       [facility]

## 2015-12-29 NOTE — Transfer of Care (Signed)
Immediate Anesthesia Transfer of Care Note  Patient: [name] [name]  Procedure(s) Performed: Procedure(s): COLONOSCOPY WITH PROPOFOL (N/A)  Patient Location: Endoscopy Unit  Anesthesia Type:General  Level of Consciousness: awake  Airway & Oxygen Therapy: Patient Spontanous Breathing and Patient connected to nasal cannula oxygen  Post-op Assessment: Report given to RN and Post -op Vital signs reviewed and stable  Post vital signs: Reviewed  Last Vitals:  Filed Vitals:   12/29/15 0923 12/29/15 1018  BP: 139/89 111/69  Pulse: 84 66  Temp: 36.6 C 37 C  Resp: 16 16    Last Pain: There were no vitals filed for this visit.       Complications: No apparent anesthesia complications

## 2016-01-01 ENCOUNTER — Encounter: Payer: Self-pay | Admitting: Unknown Physician Specialty

## 2016-01-17 ENCOUNTER — Ambulatory Visit: Payer: 59 | Admitting: Internal Medicine

## 2016-01-18 ENCOUNTER — Ambulatory Visit: Payer: 59 | Admitting: Surgery

## 2016-03-19 ENCOUNTER — Ambulatory Visit (INDEPENDENT_AMBULATORY_CARE_PROVIDER_SITE_OTHER): Payer: 59 | Admitting: Pulmonary Disease

## 2016-03-19 ENCOUNTER — Encounter: Payer: Self-pay | Admitting: Pulmonary Disease

## 2016-03-19 VITALS — BP 124/66 | HR 57

## 2016-03-19 DIAGNOSIS — T17908D Unspecified foreign body in respiratory tract, part unspecified causing other injury, subsequent encounter: Secondary | ICD-10-CM | POA: Diagnosis not present

## 2016-03-19 DIAGNOSIS — T17500A Unspecified foreign body in bronchus causing asphyxiation, initial encounter: Secondary | ICD-10-CM

## 2016-03-19 DIAGNOSIS — J9809 Other diseases of bronchus, not elsewhere classified: Secondary | ICD-10-CM
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
## 2016-03-19 NOTE — Patient Instructions (Addendum)
Continue chest percussion vest twice a day Continue flutter valve as needed  Follow up in 4-6 months or sooner as needed

## 2016-03-20 ENCOUNTER — Encounter: Payer: Self-pay | Admitting: General Surgery

## 2016-03-20 ENCOUNTER — Ambulatory Visit (INDEPENDENT_AMBULATORY_CARE_PROVIDER_SITE_OTHER): Payer: 59 | Admitting: General Surgery

## 2016-03-20 VITALS — BP 128/76 | HR 60 | Resp 14 | Ht 72.0 in | Wt 220.0 lb

## 2016-03-20 DIAGNOSIS — K9423 Gastrostomy malfunction: Secondary | ICD-10-CM | POA: Diagnosis not present

## 2016-03-20 NOTE — Patient Instructions (Signed)
The patient is aware to call back for any questions or concerns.  

## 2016-03-20 NOTE — Progress Notes (Signed)
Patient ID: [name] [name], male   DOB: 07/11/1986, 30 y.o.   MRN: [id number]  Chief Complaint  Patient presents with  . Follow-up    G tube    HPI [name] [name] is a 30 y.o. male.  Here today to discuss possibly a different size peg tube. He is here today with his mom, Beverely LowJeannie. Rhona LeavensAmanda Riguez, caregiver.  They are using the peg tube for continuous  feds, meds occaisonally and water. Oral intake is minimal. Mom states the G tube slips out of his abdomen quite often. He is using a binder which has helped some. He currently has a 14 fr 5 ml balloon. He has [facility].  HPI  Past Medical History:  Diagnosis Date  . Anemia   . Anxiety   . Depression   . Drug abuse   . Hepatitis C   . Hepatitis C   . Pulmonary emboli (HCC)   . Scoliosis   . Seizures (HCC)   . Subarachnoid hemorrhage (HCC)   . TBI (traumatic brain injury) (HCC) 07/2011    Past Surgical History:  Procedure Laterality Date  . ANKLE SURGERY Bilateral   . BRAIN SURGERY    . COLONOSCOPY WITH PROPOFOL N/A 12/29/2015   Procedure: COLONOSCOPY WITH PROPOFOL;  Surgeon: Scot Junobert T Elliott, MD;  [facility];  Service: Endoscopy;  Laterality: N/A;  . FRACTURE SURGERY     left acetabulum  . ILEOSTOMY    . IVC FILTER PLACEMENT ([facility] HX)    . Janeway gastrostomy  07/02/2014  . lung tube    . PEG TUBE PLACEMENT    . TRACHEOSTOMY      Family History  Problem Relation Age of Onset  . Hypertension    . Hyperlipidemia Father   . Autoimmune disease Mother   . Cancer Maternal Grandmother   . Heart disease Maternal Grandfather     Social History Social History  Substance Use Topics  . Smoking status: Former Smoker    Quit date: 08/07/2011  . Smokeless tobacco: Former NeurosurgeonUser    Quit date: 07/30/2011  . Alcohol use No    Allergies  Allergen Reactions  . Ambien [Zolpidem] Other (See Comments)    Patient start hallucinating.   . Ativan [Lorazepam] Itching and Other (See Comments)    Patient gets hot and  sweaty. Hallucinations   . Depakote Er [Divalproex Sodium Er] Itching and Other (See Comments)    Patient gets hot and sweaty.  . Dilaudid [Hydromorphone] Hives and Other (See Comments)    Patient gets hot and sweaty.  Marland Kitchen. Keppra [Levetiracetam] Itching and Other (See Comments)    Patient gets hot and sweaty.    Current Outpatient Prescriptions  Medication Sig Dispense Refill  . acidophilus (RISAQUAD) CAPS capsule Take 1 capsule by mouth 2 (two) times daily. 1 capsule 1  . albuterol (PROVENTIL) (2.5 MG/3ML) 0.083% nebulizer solution Inhale 3 mLs into the lungs 3 (three) times daily. 120 mL 3  . apixaban (ELIQUIS) 5 MG TABS tablet Take 5 mg by mouth 3 (three) times daily.     . Cranberry 1000 MG CAPS Take 1,000 mg by mouth daily.     Marland Kitchen. gabapentin (NEURONTIN) 300 MG capsule Take 900 mg by mouth 3 (three) times daily.     Marland Kitchen. lacosamide (VIMPAT) 200 MG TABS tablet Take 200 mg by mouth 2 (two) times daily.    Marland Kitchen. lurasidone (LATUDA) 20 MG TABS tablet Take 40 mg by mouth.     . Multiple Vitamins-Minerals (  MULTIVITAMIN WITH MINERALS) tablet Take 1 tablet by mouth daily.    . Nutritional Supplements (FEEDING SUPPLEMENT, JEVITY 1.5 CAL/FIBER,) LIQD Place 237 mLs into feeding tube 5 (five) times daily.    Marland Kitchen. nystatin (MYCOSTATIN/NYSTOP) 100000 UNIT/GM POWD Apply 1 g topically 2 (two) times daily.     Marland Kitchen. OLANZapine (ZYPREXA) 15 MG tablet Take 7.5 mg by mouth at bedtime.     Marland Kitchen. perphenazine (TRILAFON) 4 MG tablet Take 4 mg by mouth 2 (two) times daily.    . propranolol ER (INDERAL LA) 60 MG 24 hr capsule Take 1 capsule (60 mg total) by mouth daily. 30 capsule 0  . Respiratory Therapy Supplies (FLUTTER) DEVI Use as directed 1 each 0  . risedronate (ACTONEL) 35 MG tablet Take 35 mg by mouth every 7 (seven) days. with water on empty stomach, nothing by mouth or lie down for next 30 minutes. Take on Saturday.    . Water For Irrigation, Sterile (FREE WATER) SOLN Place 300 mLs into feeding tube every 6 (six) hours.  1000 mL 11   No current facility-administered medications for this visit.     Review of Systems Review of Systems  Blood pressure 128/76, pulse 60, resp. rate 14, height 6' (1.829 m), weight 220 lb (99.8 kg).  Physical Exam Physical Exam  Constitutional: He appears well-developed and well-nourished.  Abdominal:    Skin: Skin is warm.      Assessment    Intermittent dislodgment of gastrostomy tube with resulting gastric contents spill.    Plan     The gastrostomy tube is used throughout the night for enteral feedings and frequently during the day. A low profile tube is unlikely to provide any benefit. We'll obtain gastrostomy tube with a larger balloon with the hope this minimizes intermittent dislodgment. The mother is comfortable in placing the new balloon.      This information has been scribed by Dorathy DaftMarsha Hatch RN, BSN,BC.  Earline MayotteByrnett, Alleta Avery W 03/20/2016, 8:19 PM

## 2016-03-21 ENCOUNTER — Encounter: Payer: 59 | Attending: Surgery | Admitting: Surgery

## 2016-03-21 DIAGNOSIS — Z7901 Long term (current) use of anticoagulants: Secondary | ICD-10-CM | POA: Insufficient documentation

## 2016-03-21 DIAGNOSIS — X58XXXA Exposure to other specified factors, initial encounter: Secondary | ICD-10-CM | POA: Insufficient documentation

## 2016-03-21 DIAGNOSIS — S31501A Unspecified open wound of unspecified external genital organs, male, initial encounter: Secondary | ICD-10-CM | POA: Insufficient documentation

## 2016-03-21 DIAGNOSIS — Z79899 Other long term (current) drug therapy: Secondary | ICD-10-CM | POA: Insufficient documentation

## 2016-03-21 DIAGNOSIS — G40909 Epilepsy, unspecified, not intractable, without status epilepticus: Secondary | ICD-10-CM | POA: Diagnosis not present

## 2016-03-21 DIAGNOSIS — Z87820 Personal history of traumatic brain injury: Secondary | ICD-10-CM | POA: Diagnosis not present

## 2016-03-21 DIAGNOSIS — Z87891 Personal history of nicotine dependence: Secondary | ICD-10-CM | POA: Diagnosis not present

## 2016-03-21 DIAGNOSIS — B1920 Unspecified viral hepatitis C without hepatic coma: Secondary | ICD-10-CM | POA: Diagnosis not present

## 2016-03-22 NOTE — Progress Notes (Signed)
[name] [name], [name] [name] ([id number]) Visit Report for 03/21/2016 Chief Complaint Document Details Patient Name: [name] [name], [name] [name] Date of Service: 03/21/2016 8:45 AM Medical Record Number: [id number] Patient Account Number: [id number] Date of Birth/Sex: [birth year] (30 y.o. Male) Treating RN: [name] Mealy, RN, BSN, [name] [name] Primary Care Physician: [name] [name] Other Clinician: Referring Physician: SELF, [name] Treating Physician/Extender: [name] [name]: 0 Information Obtained from: Caregiver Chief Complaint Patient is at the clinic for [name] of an open wounds to the right more than left groin for about a month Electronic Signature(s) Signed: 03/21/2016 10:07:08 AM By: [name] Kanner MD, [name] [name] Entered By: [name] [name] on 03/21/2016 10:07:07 [name] [name] ([id number]) -------------------------------------------------------------------------------- HPI Details Patient Name: [name] [name] Date of Service: 03/21/2016 8:45 AM Medical Record Number: [id number] Patient Account Number: [id number] Date of Birth/Sex: Mar 19, 1986 (30 y.o. Male) Treating RN: [name] Mealy, RN, BSN, [place] [name] Primary Care Physician: [name] [name] Other Clinician: Referring Physician: SELF, [name] Treating Physician/Extender: [name] [name]: 0 History of Present Illness Location: right more than left groin Quality: Patient reports No Pain. Severity: Patient states wound are getting worse. Duration: Patient has had the wound for > 1 months prior to seeking [name] at the wound center Context: The wound appeared gradually over time HPI Description: 30 year old male who has a history of traumatic brain injury and also has a ulcer on his left heel which was noted by the family about 4-5 months ago. The TBI was in January 2013 and prior to this visit the patient has had hyperbaric oxygen therapy approximately 2 years after his TBI. He is had several gastrostomy tube placed by the gastroenterologist but then finally had  a surgical procedure to bring the gastric mucosa to the skin surface. This was done approximately 9 months ago. Past medical history of hepatitis C, traumatic brain injury, pulmonary emboli, ileostomy, tracheostomy, PEG tube placement. Was a former smoker and quit in January 2013 He known to see [place] for various issues with wounds and this time around for about a month he has open areas near his groin possibly due to the contracture. No history of fungal infection of the groin Electronic Signature(s) Signed: 03/21/2016 10:08:36 AM By: [name] Kanner MD, [name] [name] Entered By: [name] [name] on 03/21/2016 10:08:35 [name] [name] ([id number]) -------------------------------------------------------------------------------- Physical Exam Details Patient Name: [name] [name] Date of Service: 03/21/2016 8:45 AM Medical Record Number: [id number] Patient Account Number: [id number] Date of Birth/Sex: 1985/10/26 (30 y.o. Male) Treating RN: [name] Mealy, RN, BSN, [name] [name] Primary Care Physician: [name] [name] Other Clinician: Referring Physician: SELF, [name] Treating Physician/Extender: [name] [name]: 0 Constitutional . Pulse regular. Respirations normal and unlabored. Afebrile. . Eyes Nonicteric. Reactive to light. Ears, Nose, Mouth, and Throat Lips, teeth, and gums WNL.Marland Kitchen Moist mucosa without lesions. Neck supple and nontender. No palpable supraclavicular or cervical adenopathy. Normal sized without goiter. Respiratory WNL. No retractions.. Cardiovascular Pedal Pulses WNL. No clubbing, cyanosis or edema. Gastrointestinal (GI) Abdomen without masses or tenderness.. No liver or spleen enlargement or tenderness.. Lymphatic No adneopathy. No adenopathy. No adenopathy. Musculoskeletal Adexa without tenderness or enlargement.. Digits and nails w/o clubbing, cyanosis, infection, petechiae, ischemia, or inflammatory conditions.. Integumentary (Hair, Skin) No suspicious lesions. No crepitus or  fluctuance. No peri-wound warmth or erythema. No masses.Marland Kitchen Psychiatric Judgement and insight Intact.. No evidence of depression, anxiety, or agitation.. Notes He has a superficial wound in the right groin which is fairly clean with minimal moisture around it and no evidence of fungal infection. Left groin has superficial fissures. Has a condom catheter in place and does  not have any soilage Electronic Signature(s) Signed: 03/21/2016 10:09:46 AM By: [name] KannerBritto, Gohan Collister MD, [name] [name] Entered By: [name] KannerBritto, [place] [name] on 03/21/2016 10:09:45 [name] [name], [name] [name] ([id number]) -------------------------------------------------------------------------------- Physician Orders Details Patient Name: [name] [name], [name] [name] Date of Service: 03/21/2016 8:45 AM Medical Record Number: [id number] Patient Account Number: [id number] Date of Birth/Sex: 04/05/1986 (30 y.o. Male) Treating RN: [name] [name], [name] [name] Primary Care Physician: [name] PunchesMiller, [name] Other Clinician: Referring Physician: SELF, [name] Treating Physician/Extender: [name] [name], [name] [name] in [name]: 0 Verbal / Phone Orders: Yes Clinician: Huel [name], [name] [name] Read Back and Verified: Yes Diagnosis Coding Wound Cleansing Wound #2 Right Groin o Clean wound with Normal Saline. Wound #3 Left Groin o Clean wound with Normal Saline. Anesthetic Wound #2 Right Groin o Topical Lidocaine 4% cream applied to wound bed prior to debridement Wound #3 Left Groin o Topical Lidocaine 4% cream applied to wound bed prior to debridement Primary Wound Dressing Wound #2 Right Groin o Aquacel Ag Wound #3 Left Groin o Aquacel Ag Dressing Change Frequency Wound #2 Right Groin o Change dressing every other day. Wound #3 Left Groin o Change dressing every other day. Follow-up Appointments Wound #2 Right Groin o Return Appointment in 2 [name]. Wound #3 Left Groin o Return Appointment in 2 [name]. [name] HelperRBY, [name] ([id number]) Electronic Signature(s) Signed: 03/21/2016 4:46:57 PM By: [name] KannerBritto, [name] [name] Harshbarger  MD, [name] [name] Signed: 03/21/2016 4:59:47 PM By: Elliot GurneyWoody, RN, BSN, Kim RN, BSN Entered By: Elliot GurneyWoody, RN, BSN, [name] [name] on 03/21/2016 09:43:28 [name] [name], [name] [name] ([id number]) -------------------------------------------------------------------------------- Problem List Details Patient Name: [name] [name], [name] [name] Date of Service: 03/21/2016 8:45 AM Medical Record Number: [id number] Patient Account Number: [id number] Date of Birth/Sex: 01/16/1986 (30 y.o. Male) Treating RN: [name] MealyAfful, RN, BSN, [place] Primary Care Physician: [name] PunchesMiller, [name] Other Clinician: Referring Physician: SELF, [name] Treating Physician/Extender: [name] [name], [place] [name] in [name]: 0 Active Problems ICD-10 Encounter Code Description Active Date Diagnosis Z87.820 Personal history of traumatic brain injury 03/21/2016 Yes S31.501A Unspecified open wound of unspecified external genital 03/21/2016 Yes organs, male, initial encounter Inactive Problems Resolved Problems Electronic Signature(s) Signed: 03/21/2016 10:06:45 AM By: [name] KannerBritto, Dawn Convery MD, [name] [name] Entered By: [name] KannerBritto, Armeda Plumb on 03/21/2016 10:06:44 [name] [name], [place] ([id number]) -------------------------------------------------------------------------------- Progress Note Details Patient Name: [name] [name], [name] [name] Date of Service: 03/21/2016 8:45 AM Medical Record Number: [id number] Patient Account Number: [id number] Date of Birth/Sex: 12/11/1985 (30 y.o. Male) Treating RN: [name] MealyAfful, RN, BSN, [place] Primary Care Physician: [name] PunchesMiller, [name] Other Clinician: Referring Physician: SELF, [name] Treating Physician/Extender: [name] [name], Dmetrius Ambs [name] in [name]: 0 Subjective Chief Complaint Information obtained from Caregiver Patient is at the clinic for [name] of an open wounds to the right more than left groin for about a month History of Present Illness (HPI) The following HPI elements were documented for the patient's wound: Location: right more than left groin Quality: Patient reports No Pain. Severity: Patient states wound are  getting worse. Duration: Patient has had the wound for > 1 months prior to seeking [name] at the wound center Context: The wound appeared gradually over time 30 year old male who has a history of traumatic brain injury and also has a ulcer on his left heel which was noted by the family about 4-5 months ago. The TBI was in January 2013 and prior to this visit the patient has had hyperbaric oxygen therapy approximately 2 years after his TBI. He is had several gastrostomy tube placed by the gastroenterologist but then finally had a surgical procedure to bring the gastric mucosa to the skin surface. This was done approximately 9 months ago. Past medical history of hepatitis C, traumatic brain injury, pulmonary emboli, ileostomy, tracheostomy, PEG  tube placement. Was a former smoker and quit in January 2013 He known to see [place] for various issues with wounds and this time around for about a month he has open areas near his groin possibly due to the contracture. No history of fungal infection of the groin Wound History Patient presents with 2 open wounds that have been present for approximately 1 month. Patient has been treating wounds in the following manner: open to air, wound cleaner and gauze. Laboratory tests have not been performed in the last month. Patient reportedly has tested positive for an antibiotic resistant organism. Patient reportedly has not tested positive for osteomyelitis. Patient History Information obtained from Patient. Allergies Ambien, ativan, Depakote, dilauded, Keppra KJELL, BRANNEN ([id number]) Family History Hypertension - Mother, No family history of Cancer, Diabetes, Heart Disease, Hereditary Spherocytosis, Kidney Disease, Lung Disease, Seizures, Stroke, Thyroid Problems, Tuberculosis. Social History Former smoker, Marital Status - Single, Alcohol Use - Never, Drug Use - No History, Caffeine Use - Never. Medical History Cardiovascular Denies history of Angina,  Arrhythmia, Congestive Heart Failure, Coronary Artery Disease, Deep Vein Thrombosis, Hypertension, Hypotension, Myocardial Infarction, Peripheral Arterial Disease, Peripheral Venous Disease, Phlebitis, Vasculitis Gastrointestinal Patient has history of Hepatitis C - not active Denies history of Cirrhosis , Colitis, Crohn s, Hepatitis A, Hepatitis B Neurologic Patient has history of Seizure Disorder Denies history of Quadriplegia Medical And Surgical History Notes Respiratory lung tube, trachesotomy, Genitourinary foley Review of Systems (ROS) Eyes The patient has no complaints or symptoms. Ear/Nose/Mouth/Throat The patient has no complaints or symptoms. Hematologic/Lymphatic The patient has no complaints or symptoms. Respiratory The patient has no complaints or symptoms. Cardiovascular The patient has no complaints or symptoms. Gastrointestinal The patient has no complaints or symptoms. Endocrine The patient has no complaints or symptoms. Genitourinary The patient has no complaints or symptoms. Immunological The patient has no complaints or symptoms. Integumentary (Skin) Complains or has symptoms of Wounds. Denies complaints or symptoms of Bleeding or bruising tendency, Breakdown, Swelling. Musculoskeletal The patient has no complaints or symptoms. Neurologic [name] [name], [name] [name] ([id number]) Complains or has symptoms of Numbness/parasthesias. Oncologic The patient has no complaints or symptoms. Psychiatric Complains or has symptoms of Anxiety. Medications gabapentin 300 mg capsule oral capsule oral Vimpat 200 mg tablet oral tablet oral Latuda 80 mg tablet oral tablet oral Zyprexa 10 mg tablet oral tablet oral albuterol sulfate 2.5 mg/3 mL (0.083 %) solution for nebulization inhalation solution for nebulization inhalation propranolol ER 80 mg capsule,24 hr,extended release oral capsule,extended release 24 hr oral Peridex 0.12 % mouthwash mucous membrane mouthwash mucous  membrane Eliquis 5 mg tablet oral tablet oral ibuprofen 600 mg tablet oral tablet oral baclofen 20 mg tablet oral tablet oral tizanidine 2 mg capsule oral capsule oral Thick-It oral powder oral powder oral nystatin 100,000 unit/gram topical powder topical powder topical Objective Constitutional Pulse regular. Respirations normal and unlabored. Afebrile. Vitals Time Taken: 9:00 AM, Weight: 220 lbs, Source: Stated, Temperature: 97.7 F, Pulse: 64 bpm, Respiratory Rate: 18 breaths/min, Blood Pressure: 98/64 mmHg. Eyes Nonicteric. Reactive to light. Ears, Nose, Mouth, and Throat Lips, teeth, and gums WNL.Marland Kitchen Moist mucosa without lesions. Neck supple and nontender. No palpable supraclavicular or cervical adenopathy. Normal sized without goiter. Respiratory WNL. No retractions.[name] [name], [name] [name] ([id number]) Cardiovascular Pedal Pulses WNL. No clubbing, cyanosis or edema. Gastrointestinal (GI) Abdomen without masses or tenderness.. No liver or spleen enlargement or tenderness.. Lymphatic No adneopathy. No adenopathy. No adenopathy. Musculoskeletal Adexa without tenderness or enlargement.. Digits and nails w/o clubbing, cyanosis, infection, petechiae, ischemia, or inflammatory  conditions.Marland Kitchen Psychiatric Judgement and insight Intact.. No evidence of depression, anxiety, or agitation.. General Notes: He has a superficial wound in the right groin which is fairly clean with minimal moisture around it and no evidence of fungal infection. Left groin has superficial fissures. Has a condom catheter in place and does not have any soilage Integumentary (Hair, Skin) No suspicious lesions. No crepitus or fluctuance. No peri-wound warmth or erythema. No masses.. Wound #2 status is Open. Original cause of wound was Gradually Appeared. The wound is located on the Right Groin. The wound measures 1.5cm length x 0.8cm width x 0.1cm depth; 0.942cm^2 area and 0.094cm^3 volume. The wound is limited to skin  breakdown. There is no tunneling or undermining noted. There is a medium amount of serous drainage noted. The wound margin is flat and intact. There is medium (34-66%) pink granulation within the wound bed. There is no necrotic tissue within the wound bed. The periwound skin appearance exhibited: Moist. The periwound skin appearance did not exhibit: Callus, Crepitus, Excoriation, Fluctuance, Friable, Induration, Localized Edema, Rash, Scarring, Dry/Scaly, Maceration, Atrophie Blanche, Cyanosis, Ecchymosis, Hemosiderin Staining, Mottled, Pallor, Rubor, Erythema. Periwound temperature was noted as No Abnormality. Wound #3 status is Open. Original cause of wound was Gradually Appeared. The wound is located on the Left Groin. The wound measures 3cm length x 1.3cm width x 0.1cm depth; 3.063cm^2 area and 0.306cm^3 volume. The wound is limited to skin breakdown. There is no tunneling or undermining noted. There is a medium amount of serous drainage noted. The wound margin is indistinct and nonvisible. There is no granulation within the wound bed. There is no necrotic tissue within the wound bed. The periwound skin appearance exhibited: Moist. The periwound skin appearance did not exhibit: Callus, Crepitus, Excoriation, Fluctuance, Friable, Induration, Localized Edema, Rash, Scarring, Dry/Scaly, Maceration, Atrophie Blanche, Cyanosis, Ecchymosis, Hemosiderin Staining, Mottled, Pallor, Rubor, Erythema. Assessment [name] [name], [name] [name] ([id number]) Active Problems ICD-10 9544519953 - Personal history of traumatic brain injury S31.501A - Unspecified open wound of unspecified external genital organs, male, initial encounter I have recommended using some silver alginate in the creases of the groin and keeping this in place without any tape. This should be changed as often as needed but may be left on for a day or 2 at a time if not soiled. Offloading, diet and other precautions have been discussed with the  caregivers. They will come back to visit [place] once in every 2-3 [name]. Plan Wound Cleansing: Wound #2 Right Groin: Clean wound with Normal Saline. Wound #3 Left Groin: Clean wound with Normal Saline. Anesthetic: Wound #2 Right Groin: Topical Lidocaine 4% cream applied to wound bed prior to debridement Wound #3 Left Groin: Topical Lidocaine 4% cream applied to wound bed prior to debridement Primary Wound Dressing: Wound #2 Right Groin: Aquacel Ag Wound #3 Left Groin: Aquacel Ag Dressing Change Frequency: Wound #2 Right Groin: Change dressing every other day. Wound #3 Left Groin: Change dressing every other day. Follow-up Appointments: Wound #2 Right Groin: Return Appointment in 2 [name]. Wound #3 Left Groin: Return Appointment in 2 [name]. [name] [name], [name] [name] ([id number]) I have recommended using some silver alginate in the creases of the groin and keeping this in place without any tape. This should be changed as often as needed but may be left on for a day or 2 at a time if not soiled. Offloading, diet and other precautions have been discussed with the caregivers. They will come back to visit [place] once in every 2-3 [name]. Electronic Signature(s) Signed: 03/21/2016 10:10:56  AM By: [name] Kanner MD, [name] [name] Entered By: [name] [name] on 03/21/2016 10:10:56 [name] [name] ([id number]) -------------------------------------------------------------------------------- ROS/PFSH Details Patient Name: [name] [name] Date of Service: 03/21/2016 8:45 AM Medical Record Number: [id number] Patient Account Number: [id number] Date of Birth/Sex: 02/03/1986 (30 y.o. 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, VRE)o Have you tested positive for osteomyelitis (bone infection)o No Integumentary (Skin) Complaints and Symptoms: Positive for: Wounds Negative for: Bleeding or bruising tendency; Breakdown; Swelling Medical History: Positive for: History of pressure wounds Negative for: History of Burn Neurologic Complaints and Symptoms: Positive for: Numbness/parasthesias Medical History: Positive for: Paraplegia - heterotopic; Seizure Disorder Negative for: Dementia; Neuropathy; Quadriplegia Psychiatric Complaints and Symptoms: Positive for: Anxiety Medical History: Negative for: Anorexia/bulimia; Confinement Anxiety Eyes Legere, [place] ([id number]) Complaints and Symptoms: No Complaints or Symptoms Medical History: Negative for: Cataracts; Glaucoma; Optic Neuritis Ear/Nose/Mouth/Throat Complaints and Symptoms: No Complaints or Symptoms Medical History: Negative for: Chronic sinus problems/congestion; Middle ear problems Hematologic/Lymphatic Complaints and Symptoms: No Complaints or Symptoms Medical History: Negative for: Anemia; Hemophilia; Human Immunodeficiency Virus; Lymphedema; Sickle Cell Disease Respiratory Complaints and Symptoms: No Complaints or Symptoms Medical History: Negative for: Aspiration; Asthma; Chronic Obstructive Pulmonary Disease (COPD); Pneumothorax; Sleep Apnea; Tuberculosis Past Medical History Notes: lung tube, trachesotomy, Cardiovascular Complaints and Symptoms: No Complaints or Symptoms Medical History: Negative for: Angina; Arrhythmia; Congestive Heart Failure; Coronary Artery Disease; Deep Vein Thrombosis; Hypertension; Hypotension;  Myocardial Infarction; Peripheral Arterial Disease; Peripheral Venous Disease; Phlebitis; Vasculitis Gastrointestinal Complaints and Symptoms: No Complaints or Symptoms Medical History: Positive for: Hepatitis C - not active Negative for: Cirrhosis ; Colitis; Crohnos; Hepatitis A; Hepatitis B [name] [name], [name] [name] ([id number]) Endocrine Complaints and Symptoms: No Complaints or Symptoms Medical History: Negative for: Type I Diabetes; Type II Diabetes Genitourinary Complaints and Symptoms: No Complaints or Symptoms Medical History: Negative for: End Stage Renal Disease Past Medical History Notes: foley Immunological Complaints and Symptoms: No Complaints or Symptoms Medical History: Negative for: Lupus Erythematosus; Raynaudos; Scleroderma Musculoskeletal Complaints and Symptoms: No Complaints or Symptoms Medical History: Negative for: Gout; Rheumatoid Arthritis; Osteoarthritis; Osteomyelitis Oncologic Complaints and Symptoms: No Complaints or Symptoms Medical History: Negative for: Received Chemotherapy; Received Radiation Immunizations Pneumococcal Vaccine: Received Pneumococcal Vaccination: Yes Family and Social History Cancer: No; Diabetes: No; Heart Disease: No; Hereditary Spherocytosis: No; Hypertension: Yes - Mother; Kidney Disease: No; Lung Disease: No; Seizures: No; Stroke: No; Thyroid Problems: No; Tuberculosis: No; Former smoker; Marital Status - Single; Alcohol Use: Never; Drug Use: No History; Caffeine Use: Never; KYAN, YURKOVICH ([id number]) Financial Concerns: No; Food, Clothing or Shelter Needs: No; Support System Lacking: No; Advanced Directives: No; Patient does not want information on Advanced Directives; Living Will: No Physician Affirmation I have reviewed and agree with the above information. Electronic Signature(s) Signed: 03/21/2016 4:46:57 PM By: [name] Kanner MD, [name] [name] Signed: 03/21/2016 4:59:47 PM By: Elliot Gurney RN, BSN, Kim RN, BSN Entered By: [name] [name]  on 03/21/2016 10:03:47 [name] [name] ([id number]) -------------------------------------------------------------------------------- SuperBill Details  Patient Name: DELANCE, WEIDE Date of Service: 03/21/2016 Medical Record Number: [id number] Patient Account Number: [id number] Date of Birth/Sex: [birth year] (30 y.o. Male) Treating RN: [name] Mealy, RN, BSN,  [name] Primary Care Physician: [name] [name] Other Clinician: Referring Physician: SELF, [name] Treating Physician/Extender: [name] [name]: 0 Diagnosis Coding ICD-10 Codes Code Description [phone] Personal history of traumatic brain injury S31.501A Unspecified open wound of unspecified external genital organs, male, initial encounter Facility Procedures CPT4 Code: [id number] Description: 99214 - WOUND CARE VISIT-LEV 4 EST PT Modifier: Quantity: 1 Physician Procedures CPT4: Description Modifier Quantity Code [id number] 99214 - WC PHYS LEVEL 4 - EST PT 1 ICD-10 Description Diagnosis Z87.820 Personal history of traumatic brain injury S31.501A Unspecified open wound of unspecified external genital organs, male, initial  encounter Electronic Signature(s) Signed: 03/21/2016 10:11:11 AM By: [name] Kanner MD, [name] [name] Entered By: [name] [name] on 03/21/2016 10:11:11

## 2016-03-22 NOTE — Progress Notes (Signed)
[name] [name], Victorio ([id number]) Visit Report for 03/21/2016 Abuse/Suicide Risk Screen Details Patient Name: [name] [name], [name] [name] Date of Service: 03/21/2016 8:45 AM Medical Record Number: [id number] Patient Account Number: [id number] Date of Birth/Sex: 06/20/1986 (30 y.o. Male) Treating RN: Huel CoventryWoody, Kim Primary Care Physician: Bethann PunchesMiller, Mark Other Clinician: Referring Physician: SELF, REFERRED Treating Physician/Extender: Rudene ReBritto, Errol Weeks in Treatment: 0 Abuse/Suicide Risk Screen Items Answer ABUSE/SUICIDE RISK SCREEN: Has anyone close to you tried to hurt or harm you recentlyo No Do you feel uncomfortable with anyone in your familyo No Has anyone forced you do things that you didnot want to doo No Do you have any thoughts of harming yourselfo No Patient displays signs or symptoms of abuse and/or neglect. No Electronic Signature(s) Signed: 03/21/2016 4:59:47 PM By: Elliot GurneyWoody, RN, BSN, Kim RN, BSN Entered By: Elliot GurneyWoody, RN, BSN, Kim on 03/21/2016 09:25:43 [name] [name], [name] [name] ([id number]) -------------------------------------------------------------------------------- Activities of Daily Living Details Patient Name: [name] [name], [name] [name] Date of Service: 03/21/2016 8:45 AM Medical Record Number: [id number] Patient Account Number: [id number] Date of Birth/Sex: 01/28/1986 (30 y.o. Male) Treating RN: Huel CoventryWoody, Kim Primary Care Physician: Bethann PunchesMiller, Mark Other Clinician: Referring Physician: SELF, REFERRED Treating Physician/Extender: Rudene ReBritto, Errol Weeks in Treatment: 0 Activities of Daily Living Items Answer Activities of Daily Living (Please select one for each item) Drive Automobile Not Able Take Medications Not Able Use Telephone Not Able Care for Appearance Not Able Use Toilet Not Able Mady HaagensenBath / Shower Not Able Dress Self Not Able Feed Self Not Able Walk Not Able Get In / Out Bed Not Able Housework Not Able Prepare Meals Not Able Handle Money Not Able Shop for Self Not Able Electronic Signature(s) Signed: 03/21/2016  4:59:47 PM By: Elliot GurneyWoody, RN, BSN, Kim RN, BSN Entered By: Elliot GurneyWoody, RN, BSN, Kim on 03/21/2016 09:25:57 [name] [name], [name] [name] ([id number]) -------------------------------------------------------------------------------- Education Assessment Details Patient Name: [name] [name], [name] [name] Date of Service: 03/21/2016 8:45 AM Medical Record Number: [id number] Patient Account Number: [id number] Date of Birth/Sex: 08/23/1985 (30 y.o. Male) Treating RN: Huel CoventryWoody, Kim Primary Care Physician: Bethann PunchesMiller, Mark Other Clinician: Referring Physician: SELF, REFERRED Treating Physician/Extender: Rudene ReBritto, Errol Weeks in Treatment: 0 Learning Preferences/Education Level/Primary Language Learning Preference: Explanation, Demonstration Highest Education Level: High School Preferred Language: English Cognitive Barrier Assessment/Beliefs Language Barrier: No Translator Needed: No Memory Deficit: No Emotional Barrier: No Cultural/Religious Beliefs Affecting Medical No Care: Physical Barrier Assessment Impaired Vision: No Impaired Hearing: No Decreased Hand dexterity: Yes Knowledge/Comprehension Assessment Knowledge Level: High Comprehension Level: High Ability to understand written High instructions: Ability to understand verbal High instructions: Motivation Assessment Anxiety Level: Calm Cooperation: Cooperative Education Importance: Acknowledges Need Interest in Health Problems: Asks Questions Perception: Coherent Willingness to Engage in Self- High Management Activities: Readiness to Engage in Self- High Management Activities: Electronic Signature(s) Signed: 03/21/2016 4:59:47 PM By: Elliot GurneyWoody, RN, BSN, Kim RN, BSN [name] [name], [name] [name] ([id number]) Entered By: Elliot GurneyWoody, RN, BSN, Kim on 03/21/2016 09:26:59 [name] [name], [name] [name] ([id number]) -------------------------------------------------------------------------------- Fall Risk Assessment Details Patient Name: [name] [name], [name] [name] Date of Service: 03/21/2016 8:45 AM Medical Record Number:  [id number] Patient Account Number: [id number] Date of Birth/Sex: 09/29/1985 (30 y.o. Male) Treating RN: Huel CoventryWoody, Kim Primary Care Physician: Bethann PunchesMiller, Mark Other Clinician: Referring Physician: SELF, REFERRED Treating Physician/Extender: Rudene ReBritto, Errol Weeks in Treatment: 0 Fall Risk Assessment Items Have you had 2 or more falls in the last 12 monthso 0 No Have you had any fall that resulted in injury in the last 12 monthso 0 No FALL RISK ASSESSMENT: History of falling - immediate or within 3 months 0 No Secondary diagnosis 0 No Ambulatory aid None/bed rest/wheelchair/nurse 0 Yes  Crutches/cane/walker 0 No Furniture 0 No IV Access/Saline Lock 0 No Gait/Training Normal/bed rest/immobile 0 Yes Weak 0 No Impaired 0 No Mental Status Oriented to own ability 0 Yes Electronic Signature(s) Signed: 03/21/2016 4:59:47 PM By: Elliot Gurney, RN, BSN, Kim RN, BSN Entered By: Elliot Gurney, RN, BSN, Kim on 03/21/2016 09:27:16 [name] [name] ([id number]) -------------------------------------------------------------------------------- Nutrition Risk Assessment Details Patient Name: [name] [name] Date of Service: 03/21/2016 8:45 AM Medical Record Number: [id number] Patient Account Number: [id number] Date of Birth/Sex: July 16, 1986 (30 y.o. Male) Treating RN: Huel Coventry Primary Care Physician: Bethann Punches Other Clinician: Referring Physician: SELF, REFERRED Treating Physician/Extender: Rudene Re in Treatment: 0 Height (in): Weight (lbs): 220 Body Mass Index (BMI): Nutrition Risk Assessment Items NUTRITION RISK SCREEN: I have an illness or condition that made me change the kind and/or 0 No amount of food I eat I eat fewer than two meals per day 0 No I eat few fruits and vegetables, or milk products 0 No I have three or more drinks of beer, liquor or wine almost every day 0 No I have tooth or mouth problems that make it hard for me to eat 0 No I don't always have enough money to buy the food I need 0 No I  eat alone most of the time 0 No I take three or more different prescribed or over-the-counter drugs a 1 Yes day Without wanting to, I have lost or gained 10 pounds in the last six 0 No months I am not always physically able to shop, cook and/or feed myself 0 No Nutrition Protocols Good Risk Protocol 0 No interventions needed Moderate Risk Protocol Electronic Signature(s) Signed: 03/21/2016 4:59:47 PM By: Elliot Gurney, RN, BSN, Kim RN, BSN Entered By: Elliot Gurney, RN, BSN, Kim on 03/21/2016 [phone]

## 2016-03-22 NOTE — Progress Notes (Signed)
[name], [name] ([id number]) Visit Report for 03/21/2016 Allergy List Details Patient Name: [name] [name] Date of Service: 03/21/2016 8:45 AM Medical Record Number: [id number] Patient Account Number: [id number] Date of Birth/Sex: 1985/12/24 (30 y.o. Male) Treating RN: Huel Coventry Primary Care Physician: Bethann Punches Other Clinician: Referring Physician: SELF, REFERRED Treating Physician/Extender: Rudene Re in Treatment: 0 Allergies Active Allergies Ambien ativan Depakote dilauded Keppra Allergy Notes Electronic Signature(s) Signed: 03/21/2016 4:59:47 PM By: Elliot Gurney, RN, BSN, Kim RN, BSN Entered By: Elliot Gurney, RN, BSN, Kim on 03/21/2016 09:21:47 [name] [name] ([id number]) -------------------------------------------------------------------------------- Arrival Information Details Patient Name: [name] [name] Date of Service: 03/21/2016 8:45 AM Medical Record Number: [id number] Patient Account Number: [id number] Date of Birth/Sex: [birth year] (30 y.o. Male) Treating RN: Huel Coventry Primary Care Physician: Bethann Punches Other Clinician: Referring Physician: SELF, REFERRED Treating Physician/Extender: Rudene Re in Treatment: 0 Visit Information Patient Arrived: Wheel Chair Arrival Time: 08:59 Accompanied By: caregiver and mom Transfer Assistance: Nurse, adult Patient Identification Verified: Yes Secondary Verification Process Yes Completed: History Since Last Visit Added or deleted any medications: Yes Any new allergies or adverse reactions: No Had a fall or experienced change in activities of daily living that may affect risk of falls: No Signs or symptoms of abuse/neglect since last visito No Hospitalized since last visit: No Pain Present Now: Unable to Electronic Signature(s) Signed: 03/21/2016 4:59:47 PM By: Elliot Gurney, RN, BSN, Kim RN, BSN Entered By: Elliot Gurney, RN, BSN, Kim on 03/21/2016 09:00:06 [name] [name]  ([id number]) -------------------------------------------------------------------------------- Clinic Level of Care Assessment Details Patient Name: [name] [name] Date of Service: 03/21/2016 8:45 AM Medical Record Number: [id number] Patient Account Number: [id number] Date of Birth/Sex: 12-23-1985 (30 y.o. Male) Treating RN: Huel Coventry Primary Care Physician: Bethann Punches Other Clinician: Referring Physician: SELF, REFERRED Treating Physician/Extender: Rudene Re in Treatment: 0 Clinic Level of Care Assessment Items TOOL 2 Quantity Score []  - Use when only an EandM is performed on the INITIAL visit 0 ASSESSMENTS - Nursing Assessment / Reassessment X - General Physical Exam (combine w/ comprehensive assessment (listed just 1 20 below) when performed on new pt. evals) X - Comprehensive Assessment (HX, ROS, Risk Assessments, Wounds Hx, etc.) 1 25 ASSESSMENTS - Wound and Skin Assessment / Reassessment []  - Simple Wound Assessment / Reassessment - one wound 0 X - Complex Wound Assessment / Reassessment - multiple wounds 2 5 []  - Dermatologic / Skin Assessment (not related to wound area) 0 ASSESSMENTS - Ostomy and/or Continence Assessment and Care []  - Incontinence Assessment and Management 0 []  - Ostomy Care Assessment and Management (repouching, etc.) 0 PROCESS - Coordination of Care X - Simple Patient / Family Education for ongoing care 1 15 []  - Complex (extensive) Patient / Family Education for ongoing care 0 X - Staff obtains Chiropractor, Records, Test Results / Process Orders 1 10 []  - Staff telephones HHA, Nursing Homes / Clarify orders / etc 0 []  - Routine Transfer to another Facility (non-emergent condition) 0 []  - Routine Hospital Admission (non-emergent condition) 0 []  - New Admissions / Manufacturing engineer / Ordering NPWT, Apligraf, etc. 0 []  - Emergency Hospital Admission (emergent condition) 0 X - Simple Discharge Coordination 1 10 Kofman, Yordy ([id number]) []  -  Complex (extensive) Discharge Coordination 0 PROCESS - Special Needs []  - Pediatric / Minor Patient Management 0 []  - Isolation Patient Management 0 []  - Hearing / Language / Visual special needs 0 []  - Assessment of Community assistance (transportation, D/C planning, etc.) 0 []  - Additional assistance / Altered mentation 0 []  - Support  Surface(s) Assessment (bed, cushion, seat, etc.) 0 INTERVENTIONS - Wound Cleansing / Measurement X - Wound Imaging (photographs - any number of wounds) 1 5 []  - Wound Tracing (instead of photographs) 0 X - Simple Wound Measurement - one wound 1 5 []  - Complex Wound Measurement - multiple wounds 0 X - Simple Wound Cleansing - one wound 1 5 []  - Complex Wound Cleansing - multiple wounds 0 INTERVENTIONS - Wound Dressings X - Small Wound Dressing one or multiple wounds 2 10 []  - Medium Wound Dressing one or multiple wounds 0 []  - Large Wound Dressing one or multiple wounds 0 []  - Application of Medications - injection 0 INTERVENTIONS - Miscellaneous []  - External ear exam 0 []  - Specimen Collection (cultures, biopsies, blood, body fluids, etc.) 0 []  - Specimen(s) / Culture(s) sent or taken to Lab for analysis 0 []  - Patient Transfer (multiple staff / Nurse, adult / Similar devices) 0 []  - Simple Staple / Suture removal (25 or less) 0 []  - Complex Staple / Suture removal (26 or more) 0 Shaddix, Adel ([id number]) []  - Hypo / Hyperglycemic Management (close monitor of Blood Glucose) 0 []  - Ankle / Brachial Index (ABI) - do not check if billed separately 0 Has the patient been seen at the hospital within the last three years: Yes Total Score: 125 Level Of Care: New/Established - Level 4 Electronic Signature(s) Signed: 03/21/2016 4:59:47 PM By: Elliot Gurney, RN, BSN, Kim RN, BSN Entered By: Elliot Gurney, RN, BSN, Kim on 03/21/2016 09:55:09 [name] [name] ([id number]) -------------------------------------------------------------------------------- Encounter Discharge  Information Details Patient Name: [name] [name] Date of Service: 03/21/2016 8:45 AM Medical Record Number: [id number] Patient Account Number: [id number] Date of Birth/Sex: July 23, 1986 (30 y.o. Male) Treating RN: Huel Coventry Primary Care Physician: Bethann Punches Other Clinician: Referring Physician: SELF, REFERRED Treating Physician/Extender: Rudene Re in Treatment: 0 Encounter Discharge Information Items Discharge Pain Level: 0 Discharge Condition: Stable Ambulatory Status: Wheelchair Discharge Destination: Home Transportation: Private Auto caregiver and Accompanied By: mom Schedule Follow-up Appointment: Yes Medication Reconciliation completed and provided to Patient/Care Yes Creighton Longley: Provided on Clinical Summary of Care: 03/21/2016 Form Type Recipient Paper Patient ZI Electronic Signature(s) Signed: 03/21/2016 9:56:39 AM By: Gwenlyn Perking Entered By: Gwenlyn Perking on 03/21/2016 09:56:39 [name] [name] ([id number]) -------------------------------------------------------------------------------- Multi Wound Chart Details Patient Name: [name] [name] Date of Service: 03/21/2016 8:45 AM Medical Record Number: [id number] Patient Account Number: [id number] Date of Birth/Sex: 08/12/1985 (30 y.o. Male) Treating RN: Huel Coventry Primary Care Physician: Bethann Punches Other Clinician: Referring Physician: SELF, REFERRED Treating Physician/Extender: Rudene Re in Treatment: 0 Vital Signs Height(in): Pulse(bpm): 64 Weight(lbs): 220 Blood Pressure 98/64 (mmHg): Body Mass Index(BMI): Temperature(F): 97.7 Respiratory Rate 18 (breaths/min): Photos: [2:No Photos] [3:No Photos] [N/A:N/A] Wound Location: [2:Right Groin] [3:Left Groin] [N/A:N/A] Wounding Event: [2:Gradually Appeared] [3:Gradually Appeared] [N/A:N/A] Primary Etiology: [2:To be determined] [3:Pressure Ulcer] [N/A:N/A] Date Acquired: [2:02/19/2016] [3:03/11/2016] [N/A:N/A] Weeks of Treatment: [2:0] [3:0]  [N/A:N/A] Wound Status: [2:Open] [3:Open] [N/A:N/A] Measurements L x W x D 1.5x0.8x0.1 [3:3x1.3x0.1] [N/A:N/A] (cm) Area (cm) : [2:0.942] [3:3.063] [N/A:N/A] Volume (cm) : [2:0.094] [3:0.306] [N/A:N/A] Classification: [2:Partial Thickness] [3:Category/Stage I] [N/A:N/A] Exudate Amount: [2:Small] [3:Small] [N/A:N/A] Exudate Type: [2:Serous] [3:Serous] [N/A:N/A] Exudate Color: [2:amber] [3:amber] [N/A:N/A] Wound Margin: [2:Flat and Intact] [3:Indistinct, nonvisible] [N/A:N/A] Granulation Amount: [2:Medium (34-66%)] [3:None Present (0%)] [N/A:N/A] Granulation Quality: [2:Pink] [3:N/A] [N/A:N/A] Necrotic Amount: [2:None Present (0%)] [3:None Present (0%)] [N/A:N/A] Exposed Structures: [2:Fascia: No Fat: No Tendon: No Muscle: No Joint: No Bone: No Limited to Skin Breakdown] [3:Fascia: No Fat: No Tendon: No  Muscle: No Joint: No Bone: No Limited to Skin Breakdown] [N/A:N/A] Epithelialization: [2:Small (1-33%)] [3:Small (1-33%)] [N/A:N/A] Periwound Skin Texture: Edema: No [2:Excoriation: No] [3:Edema: No Excoriation: No] [N/A:N/A] Induration: No Induration: No Callus: No Callus: No Crepitus: No Crepitus: No Fluctuance: No Fluctuance: No Friable: No Friable: No Rash: No Rash: No Scarring: No Scarring: No Periwound Skin Moist: Yes Moist: Yes N/A Moisture: Maceration: No Maceration: No Dry/Scaly: No Dry/Scaly: No Periwound Skin Color: Atrophie Blanche: No Atrophie Blanche: No N/A Cyanosis: No Cyanosis: No Ecchymosis: No Ecchymosis: No Erythema: No Erythema: No Hemosiderin Staining: No Hemosiderin Staining: No Mottled: No Mottled: No Pallor: No Pallor: No Rubor: No Rubor: No Temperature: No Abnormality N/A N/A Tenderness on No No N/A Palpation: Wound Preparation: Ulcer Cleansing: Ulcer Cleansing: N/A Rinsed/Irrigated with Rinsed/Irrigated with Saline Saline Topical Anesthetic Topical Anesthetic Applied: Other: lidocaine Applied: Other: lidocaine 4%  4% Treatment Notes Electronic Signature(s) Signed: 03/21/2016 4:59:47 PM By: Elliot Gurney, RN, BSN, Kim RN, BSN Entered By: Elliot Gurney, RN, BSN, Kim on 03/21/2016 09:41:50 [name] [name] ([id number]) -------------------------------------------------------------------------------- Multi-Disciplinary Care Plan Details Patient Name: [name] [name] Date of Service: 03/21/2016 8:45 AM Medical Record Number: [id number] Patient Account Number: [id number] Date of Birth/Sex: 08-22-1985 (30 y.o. Male) Treating RN: Huel Coventry Primary Care Physician: Bethann Punches Other Clinician: Referring Physician: SELF, REFERRED Treating Physician/Extender: Rudene Re in Treatment: 0 Active Inactive Orientation to the Wound Care Program Nursing Diagnoses: Knowledge deficit related to the wound healing center program Goals: Patient/caregiver will verbalize understanding of the Wound Healing Center Program Date Initiated: 03/21/2016 Goal Status: Active Interventions: Provide education on orientation to the wound center Notes: Pressure Nursing Diagnoses: Knowledge deficit related to management of pressures ulcers Goals: Patient will remain free of pressure ulcers Date Initiated: 03/21/2016 Goal Status: Active Interventions: Assess: immobility, friction, shearing, incontinence upon admission and as needed Notes: Wound/Skin Impairment Nursing Diagnoses: Impaired tissue integrity Goals: Ulcer/skin breakdown will have a volume reduction of 30% by week 4 Date Initiated: 03/21/2016 [name] [name] ([id number]) Goal Status: Active Interventions: Assess patient/caregiver ability to perform ulcer/skin care regimen upon admission and as needed Notes: Electronic Signature(s) Signed: 03/21/2016 4:59:47 PM By: Elliot Gurney, RN, BSN, Kim RN, BSN Entered By: Elliot Gurney, RN, BSN, Kim on 03/21/2016 09:41:27 [name] [name] ([id number]) -------------------------------------------------------------------------------- Pain Assessment  Details Patient Name: [name] [name] Date of Service: 03/21/2016 8:45 AM Medical Record Number: [id number] Patient Account Number: [id number] Date of Birth/Sex: 1986/01/02 (30 y.o. Male) Treating RN: Huel Coventry Primary Care Physician: Bethann Punches Other Clinician: Referring Physician: SELF, REFERRED Treating Physician/Extender: Rudene Re in Treatment: 0 Active Problems Location of Pain Severity and Description of Pain Patient Has Paino No Site Locations Pain Management and Medication Current Pain Management: Electronic Signature(s) Signed: 03/21/2016 4:59:47 PM By: Elliot Gurney, RN, BSN, Kim RN, BSN Entered By: Elliot Gurney, RN, BSN, Kim on 03/21/2016 09:00:12 [name] [name] ([id number]) -------------------------------------------------------------------------------- Patient/Caregiver Education Details Patient Name: [name] [name] Date of Service: 03/21/2016 8:45 AM Medical Record Number: [id number] Patient Account Number: [id number] Date of Birth/Gender: [birth year] (30 y.o. Male) Treating RN: Huel Coventry Primary Care Physician: Bethann Punches Other Clinician: Referring Physician: SELF, REFERRED Treating Physician/Extender: Rudene Re in Treatment: 0 Education Assessment Education Provided To: Patient Education Topics Provided Welcome To The Wound Care Center: Handouts: Welcome To The Wound Care Center Methods: Demonstration Responses: State content correctly Wound/Skin Impairment: Handouts: Caring for Your Ulcer Methods: Demonstration Responses: State content correctly Electronic Signature(s) Signed: 03/21/2016 4:59:47 PM By: Elliot Gurney, RN, BSN, Kim RN, BSN Entered By: Elliot Gurney, RN, BSN, Kim on 03/21/2016 09:56:37 [name] [name] (  [id number]) -------------------------------------------------------------------------------- Wound Assessment Details Patient Name: [name] HelperRBY, Deidrick Date of Service: 03/21/2016 8:45 AM Medical Record Number: [id number] Patient Account Number: [id number] Date of  Birth/Sex: 03/05/1986 (30 y.o. Male) Treating RN: Huel CoventryWoody, Kim Primary Care Physician: Bethann PunchesMiller, Mark Other Clinician: Referring Physician: SELF, REFERRED Treating Physician/Extender: Rudene ReBritto, Errol Weeks in Treatment: 0 Wound Status Wound Number: 2 Primary To be determined Etiology: Wound Location: Right Groin Wound Open Wounding Event: Gradually Appeared Status: Date Acquired: 02/19/2016 Comorbid Hepatitis C, History of pressure Weeks Of Treatment: 0 History: wounds, Paraplegia, Seizure Disorder Clustered Wound: No Photos Photo Uploaded By: Elliot GurneyWoody, RN, BSN, Kim on 03/21/2016 11:47:29 Wound Measurements Length: (cm) 1.5 Width: (cm) 0.8 Depth: (cm) 0.1 Area: (cm) 0.942 Volume: (cm) 0.094 % Reduction in Area: 0% % Reduction in Volume: 0% Epithelialization: Small (1-33%) Tunneling: No Undermining: No Wound Description Classification: Partial Thickness Wound Margin: Flat and Intact Exudate Amount: Medium Exudate Type: Serous Exudate Color: amber Wound Bed Granulation Amount: Medium (34-66%) Exposed Structure Granulation Quality: Pink Fascia Exposed: No Necrotic Amount: None Present (0%) Fat Layer Exposed: No Tendon Exposed: No Luera, Rondall ([id number]) Muscle Exposed: No Joint Exposed: No Bone Exposed: No Limited to Skin Breakdown Periwound Skin Texture Texture Color No Abnormalities Noted: No No Abnormalities Noted: No Callus: No Atrophie Blanche: No Crepitus: No Cyanosis: No Excoriation: No Ecchymosis: No Fluctuance: No Erythema: No Friable: No Hemosiderin Staining: No Induration: No Mottled: No Localized Edema: No Pallor: No Rash: No Rubor: No Scarring: No Temperature / Pain Moisture Temperature: No Abnormality No Abnormalities Noted: No Dry / Scaly: No Maceration: No Moist: Yes Wound Preparation Ulcer Cleansing: Rinsed/Irrigated with Saline Topical Anesthetic Applied: Other: lidocaine 4%, Electronic Signature(s) Signed: 03/21/2016 4:59:47 PM  By: Elliot GurneyWoody, RN, BSN, Kim RN, BSN Entered By: Elliot GurneyWoody, RN, BSN, Kim on 03/21/2016 10:05:21 [name] HelperIRBY, Xzaiver ([id number]) -------------------------------------------------------------------------------- Wound Assessment Details Patient Name: [name] HelperIRBY, Jhace Date of Service: 03/21/2016 8:45 AM Medical Record Number: [id number] Patient Account Number: [id number] Date of Birth/Sex: 11/03/1985 (30 y.o. Male) Treating RN: Huel CoventryWoody, Kim Primary Care Physician: Bethann PunchesMiller, Mark Other Clinician: Referring Physician: SELF, REFERRED Treating Physician/Extender: Rudene ReBritto, Errol Weeks in Treatment: 0 Wound Status Wound Number: 3 Primary Pressure Ulcer Etiology: Wound Location: Left Groin Wound Open Wounding Event: Gradually Appeared Status: Date Acquired: 03/11/2016 Comorbid Hepatitis C, History of pressure Weeks Of Treatment: 0 History: wounds, Paraplegia, Seizure Disorder Clustered Wound: No Photos Photo Uploaded By: Elliot GurneyWoody, RN, BSN, Kim on 03/21/2016 11:48:10 Wound Measurements Length: (cm) 3 Width: (cm) 1.3 Depth: (cm) 0.1 Area: (cm) 3.063 Volume: (cm) 0.306 % Reduction in Area: 0% % Reduction in Volume: 0% Epithelialization: Small (1-33%) Tunneling: No Undermining: No Wound Description Classification: Category/Stage I Wound Margin: Indistinct, nonvisible Exudate Amount: Medium Exudate Type: Serous Exudate Color: amber Wound Bed Granulation Amount: None Present (0%) Exposed Structure Necrotic Amount: None Present (0%) Fascia Exposed: No Fat Layer Exposed: No Tendon Exposed: No Stay, Staton ([id number]) Muscle Exposed: No Joint Exposed: No Bone Exposed: No Limited to Skin Breakdown Periwound Skin Texture Texture Color No Abnormalities Noted: No No Abnormalities Noted: No Callus: No Atrophie Blanche: No Crepitus: No Cyanosis: No Excoriation: No Ecchymosis: No Fluctuance: No Erythema: No Friable: No Hemosiderin Staining: No Induration: No Mottled: No Localized Edema:  No Pallor: No Rash: No Rubor: No Scarring: No Moisture No Abnormalities Noted: No Dry / Scaly: No Maceration: No Moist: Yes Wound Preparation Ulcer Cleansing: Rinsed/Irrigated with Saline Topical Anesthetic Applied: Other: lidocaine 4%, Treatment Notes Wound #3 (Left Groin) 1. Cleansed with: Clean wound with Normal Saline 2. Anesthetic Topical  Lidocaine 4% cream to wound bed prior to debridement 4. Dressing Applied: Aquacel Ag Electronic Signature(s) Signed: 03/21/2016 4:59:47 PM By: Elliot Gurney, RN, BSN, Kim RN, BSN Entered By: Elliot Gurney, RN, BSN, Kim on 03/21/2016 10:05:53 [name] [name] ([id number]) -------------------------------------------------------------------------------- Vitals Details Patient Name: [name] [name] Date of Service: 03/21/2016 8:45 AM Medical Record Number: [id number] Patient Account Number: [id number] Date of Birth/Sex: Feb 19, 1986 (30 y.o. Male) Treating RN: Huel Coventry Primary Care Physician: Bethann Punches Other Clinician: Referring Physician: SELF, REFERRED Treating Physician/Extender: Rudene Re in Treatment: 0 Vital Signs Time Taken: 09:00 Temperature (F): 97.7 Weight (lbs): 220 Pulse (bpm): 64 Source: Stated Respiratory Rate (breaths/min): 18 Blood Pressure (mmHg): 98/64 Reference Range: 80 - 120 mg / dl Electronic Signature(s) Signed: 03/21/2016 4:59:47 PM By: Elliot Gurney, RN, BSN, Kim RN, BSN Entered By: Elliot Gurney, RN, BSN, Kim on 03/21/2016 09:00:43

## 2016-04-04 ENCOUNTER — Encounter: Payer: 59 | Attending: Surgery | Admitting: Surgery

## 2016-04-04 DIAGNOSIS — G40909 Epilepsy, unspecified, not intractable, without status epilepticus: Secondary | ICD-10-CM | POA: Diagnosis not present

## 2016-04-04 DIAGNOSIS — Z87820 Personal history of traumatic brain injury: Secondary | ICD-10-CM | POA: Diagnosis not present

## 2016-04-04 DIAGNOSIS — Z79899 Other long term (current) drug therapy: Secondary | ICD-10-CM | POA: Diagnosis not present

## 2016-04-04 DIAGNOSIS — S31501A Unspecified open wound of unspecified external genital organs, male, initial encounter: Secondary | ICD-10-CM | POA: Insufficient documentation

## 2016-04-04 DIAGNOSIS — Z7901 Long term (current) use of anticoagulants: Secondary | ICD-10-CM | POA: Insufficient documentation

## 2016-04-04 DIAGNOSIS — X58XXXA Exposure to other specified factors, initial encounter: Secondary | ICD-10-CM | POA: Insufficient documentation

## 2016-04-04 DIAGNOSIS — B1920 Unspecified viral hepatitis C without hepatic coma: Secondary | ICD-10-CM | POA: Diagnosis not present

## 2016-04-04 DIAGNOSIS — Z87891 Personal history of nicotine dependence: Secondary | ICD-10-CM | POA: Diagnosis not present

## 2016-04-05 NOTE — Progress Notes (Signed)
SILVESTER, REIERSON ([id number]) Visit Report for 04/04/2016 Arrival Information Details Patient Name: [name] [name], [name] [name] Date of Service: 04/04/2016 10:00 AM Medical Record Number: [id number] Patient Account Number: [id number] Date of Birth/Sex: 1985-09-25 (30 y.o. Male) Treating RN: Huel Coventry Primary Care Physician: Bethann Punches Other Clinician: Referring Physician: Bethann Punches Treating Physician/Extender: Rudene Re in Treatment: 2 Visit Information History Since Last Visit Added or deleted any medications: No Patient Arrived: Wheel Chair Any new allergies or adverse reactions: No Arrival Time: 10:22 Had a fall or experienced change in No activities of daily living that may affect Accompanied By: caregiver risk of falls: Transfer Assistance: Nurse, adult Signs or symptoms of abuse/neglect since last No Patient Identification Verified: Yes visito Secondary Verification Process Yes Hospitalized since last visit: No Completed: Has Dressing in Place as Prescribed: Yes Pain Present Now: No Electronic Signature(s) Signed: 04/04/2016 3:26:10 PM By: Elliot Gurney, RN, BSN, Kim RN, BSN Entered By: Elliot Gurney, RN, BSN, Kim on 04/04/2016 10:22:36 [name] [name] ([id number]) -------------------------------------------------------------------------------- Clinic Level of Care Assessment Details Patient Name: [name] [name] Date of Service: 04/04/2016 10:00 AM Medical Record Number: [id number] Patient Account Number: [id number] Date of Birth/Sex: 21-Jan-1986 (30 y.o. Male) Treating RN: Clover Mealy, RN, BSN, Rita Primary Care Physician: Bethann Punches Other Clinician: Referring Physician: Bethann Punches Treating Physician/Extender: Rudene Re in Treatment: 2 Clinic Level of Care Assessment Items TOOL 4 Quantity Score []  - Use when only an EandM is performed on FOLLOW-UP visit 0 ASSESSMENTS - Nursing Assessment / Reassessment X - Reassessment of Co-morbidities (includes updates in patient status) 1 10 X -  Reassessment of Adherence to Treatment Plan 1 5 ASSESSMENTS - Wound and Skin Assessment / Reassessment []  - Simple Wound Assessment / Reassessment - one wound 0 X - Complex Wound Assessment / Reassessment - multiple wounds 2 5 []  - Dermatologic / Skin Assessment (not related to wound area) 0 ASSESSMENTS - Focused Assessment []  - Circumferential Edema Measurements - multi extremities 0 []  - Nutritional Assessment / Counseling / Intervention 0 []  - Lower Extremity Assessment (monofilament, tuning fork, pulses) 0 []  - Peripheral Arterial Disease Assessment (using hand held doppler) 0 ASSESSMENTS - Ostomy and/or Continence Assessment and Care []  - Incontinence Assessment and Management 0 []  - Ostomy Care Assessment and Management (repouching, etc.) 0 PROCESS - Coordination of Care X - Simple Patient / Family Education for ongoing care 1 15 []  - Complex (extensive) Patient / Family Education for ongoing care 0 []  - Staff obtains Chiropractor, Records, Test Results / Process Orders 0 []  - Staff telephones HHA, Nursing Homes / Clarify orders / etc 0 []  - Routine Transfer to another Facility (non-emergent condition) 0 Wentworth, Delano ([id number]) []  - Routine Hospital Admission (non-emergent condition) 0 []  - New Admissions / Manufacturing engineer / Ordering NPWT, Apligraf, etc. 0 []  - Emergency Hospital Admission (emergent condition) 0 []  - Simple Discharge Coordination 0 []  - Complex (extensive) Discharge Coordination 0 PROCESS - Special Needs []  - Pediatric / Minor Patient Management 0 []  - Isolation Patient Management 0 []  - Hearing / Language / Visual special needs 0 []  - Assessment of Community assistance (transportation, D/C planning, etc.) 0 []  - Additional assistance / Altered mentation 0 []  - Support Surface(s) Assessment (bed, cushion, seat, etc.) 0 INTERVENTIONS - Wound Cleansing / Measurement []  - Simple Wound Cleansing - one wound 0 X - Complex Wound Cleansing - multiple wounds 2  5 X - Wound Imaging (photographs - any number of wounds) 1 5 []  - Wound Tracing (instead of photographs) 0 []  -  Simple Wound Measurement - one wound 0 X - Complex Wound Measurement - multiple wounds 2 5 INTERVENTIONS - Wound Dressings X - Small Wound Dressing one or multiple wounds 2 10 []  - Medium Wound Dressing one or multiple wounds 0 []  - Large Wound Dressing one or multiple wounds 0 []  - Application of Medications - topical 0 []  - Application of Medications - injection 0 INTERVENTIONS - Miscellaneous []  - External ear exam 0 Surowiec, Rue ([id number]) []  - Specimen Collection (cultures, biopsies, blood, body fluids, etc.) 0 []  - Specimen(s) / Culture(s) sent or taken to Lab for analysis 0 X - Patient Transfer (multiple staff / Nurse, adult / Similar devices) 1 10 []  - Simple Staple / Suture removal (25 or less) 0 []  - Complex Staple / Suture removal (26 or more) 0 []  - Hypo / Hyperglycemic Management (close monitor of Blood Glucose) 0 []  - Ankle / Brachial Index (ABI) - do not check if billed separately 0 X - Vital Signs 1 5 Has the patient been seen at the hospital within the last three years: Yes Total Score: 100 Level Of Care: New/Established - Level 3 Electronic Signature(s) Signed: 04/04/2016 5:31:48 PM By: Elpidio Eric BSN, RN Entered By: Elpidio Eric on 04/04/2016 10:45:54 [name] [name] ([id number]) -------------------------------------------------------------------------------- Encounter Discharge Information Details Patient Name: [name] [name] Date of Service: 04/04/2016 10:00 AM Medical Record Number: [id number] Patient Account Number: [id number] Date of Birth/Sex: 1986/02/10 (30 y.o. Male) Treating RN: Huel Coventry Primary Care Physician: Bethann Punches Other Clinician: Referring Physician: Bethann Punches Treating Physician/Extender: Rudene Re in Treatment: 2 Encounter Discharge Information Items Discharge Pain Level: 0 Discharge Condition: Stable Ambulatory Status:  Wheelchair Discharge Destination: Home Transportation: Private Auto Accompanied By: caregiver Schedule Follow-up Appointment: Yes Medication Reconciliation completed Yes and provided to Patient/Care Shanina Kepple: Provided on Clinical Summary of Care: 04/04/2016 Form Type Recipient Paper Patient ZI Electronic Signature(s) Signed: 04/04/2016 10:54:36 AM By: Gwenlyn Perking Entered By: Gwenlyn Perking on 04/04/2016 10:54:36 [name] [name] ([id number]) -------------------------------------------------------------------------------- Multi Wound Chart Details Patient Name: [name] [name] Date of Service: 04/04/2016 10:00 AM Medical Record Number: [id number] Patient Account Number: [id number] Date of Birth/Sex: 12/05/1985 (30 y.o. Male) Treating RN: Clover Mealy, RN, BSN, Rita Primary Care Physician: Bethann Punches Other Clinician: Referring Physician: Bethann Punches Treating Physician/Extender: Rudene Re in Treatment: 2 Vital Signs Height(in): Pulse(bpm): 62 Weight(lbs): 220 Blood Pressure 99/51 (mmHg): Body Mass Index(BMI): Temperature(F): 97.7 Respiratory Rate 16 (breaths/min): Photos: [N/A:N/A] Wound Location: Right Groin Left Groin N/A Wounding Event: Gradually Appeared Gradually Appeared N/A Primary Etiology: Pressure Ulcer Pressure Ulcer N/A Comorbid History: Hepatitis C, History of Hepatitis C, History of N/A pressure wounds, pressure wounds, Paraplegia, Seizure Paraplegia, Seizure Disorder Disorder Date Acquired: 02/19/2016 03/11/2016 N/A Weeks of Treatment: 2 2 N/A Wound Status: Open Open N/A Measurements L x W x D 4x0.9x0.1 4x0.8x0.1 N/A (cm) Area (cm) : 2.827 2.513 N/A Volume (cm) : 0.283 0.251 N/A % Reduction in Area: -200.10% 18.00% N/A % Reduction in Volume: -201.10% 18.00% N/A Classification: Category/Stage II Category/Stage I N/A Exudate Amount: Medium Medium N/A Exudate Type: Serous Serous N/A Exudate Color: amber amber N/A Wound Margin: Flat and Intact  Indistinct, nonvisible N/A Granulation Amount: Medium (34-66%) None Present (0%) N/A Granulation Quality: [place] Service N/A N/A Evers, [name] [name] ([id number]) Necrotic Amount: None Present (0%) None Present (0%) N/A Exposed Structures: Fascia: No Fascia: No N/A Fat: No Fat: No Tendon: No Tendon: No Muscle: No Muscle: No Joint: No Joint: No Bone: No Bone: No Limited to Skin Limited to Skin  Breakdown Breakdown Epithelialization: Small (1-33%) Small (1-33%) N/A Periwound Skin Texture: Edema: No Edema: No N/A Excoriation: No Excoriation: No Induration: No Induration: No Callus: No Callus: No Crepitus: No Crepitus: No Fluctuance: No Fluctuance: No Friable: No Friable: No Rash: No Rash: No Scarring: No Scarring: No Periwound Skin Moist: Yes Moist: Yes N/A Moisture: Maceration: No Maceration: No Dry/Scaly: No Dry/Scaly: No Periwound Skin Color: Atrophie Blanche: No Atrophie Blanche: No N/A Cyanosis: No Cyanosis: No Ecchymosis: No Ecchymosis: No Erythema: No Erythema: No Hemosiderin Staining: No Hemosiderin Staining: No Mottled: No Mottled: No Pallor: No Pallor: No Rubor: No Rubor: No Temperature: No Abnormality N/A N/A Tenderness on No No N/A Palpation: Wound Preparation: Ulcer Cleansing: Ulcer Cleansing: N/A Rinsed/Irrigated with Rinsed/Irrigated with Saline Saline Topical Anesthetic Topical Anesthetic Applied: None Applied: None Treatment Notes Electronic Signature(s) Signed: 04/04/2016 5:31:48 PM By: Elpidio EricAfful, Rita BSN, RN Entered By: Elpidio EricAfful, Rita on 04/04/2016 10:37:30 [name] HelperIRBY, [name] [name] ([id number]) -------------------------------------------------------------------------------- Multi-Disciplinary Care Plan Details Patient Name: [name] HelperIRBY, [name] [name] Date of Service: 04/04/2016 10:00 AM Medical Record Number: [id number] Patient Account Number: [id number] Date of Birth/Sex: 06/10/1986 (30 y.o. Male) Treating RN: Clover MealyAfful, RN, BSN, Rita Primary Care Physician: Bethann PunchesMiller,  Mark Other Clinician: Referring Physician: Bethann PunchesMiller, Mark Treating Physician/Extender: Rudene ReBritto, Errol Weeks in Treatment: 2 Active Inactive Orientation to the Wound Care Program Nursing Diagnoses: Knowledge deficit related to the wound healing center program Goals: Patient/caregiver will verbalize understanding of the Wound Healing Center Program Date Initiated: 03/21/2016 Goal Status: Active Interventions: Provide education on orientation to the wound center Notes: Pressure Nursing Diagnoses: Knowledge deficit related to management of pressures ulcers Goals: Patient will remain free of pressure ulcers Date Initiated: 03/21/2016 Goal Status: Active Interventions: Assess: immobility, friction, shearing, incontinence upon admission and as needed Notes: Wound/Skin Impairment Nursing Diagnoses: Impaired tissue integrity Goals: Ulcer/skin breakdown will have a volume reduction of 30% by week 4 Date Initiated: 03/21/2016 [name] HelperIRBY, [name] [name] ([id number]) Goal Status: Active Interventions: Assess patient/caregiver ability to perform ulcer/skin care regimen upon admission and as needed Notes: Electronic Signature(s) Signed: 04/04/2016 5:31:48 PM By: Elpidio EricAfful, Rita BSN, RN Entered By: Elpidio EricAfful, Rita on 04/04/2016 10:37:10 [name] HelperIRBY, [name] [name] ([id number]) -------------------------------------------------------------------------------- Pain Assessment Details Patient Name: [name] HelperIRBY, [name] [name] Date of Service: 04/04/2016 10:00 AM Medical Record Number: [id number] Patient Account Number: [id number] Date of Birth/Sex: 08/12/1985 (30 y.o. Male) Treating RN: Huel CoventryWoody, Kim Primary Care Physician: Bethann PunchesMiller, Mark Other Clinician: Referring Physician: Bethann PunchesMiller, Mark Treating Physician/Extender: Rudene ReBritto, Errol Weeks in Treatment: 2 Active Problems Location of Pain Severity and Description of Pain Patient Has Paino Yes Site Locations Pain Location: Pain in Ulcers With Dressing Change: Yes Pain Management and  Medication Current Pain Management: Electronic Signature(s) Signed: 04/04/2016 3:26:10 PM By: Elliot GurneyWoody, RN, BSN, Kim RN, BSN Entered By: Elliot GurneyWoody, RN, BSN, Kim on 04/04/2016 10:23:25 [name] HelperIRBY, [name] [name] ([id number]) -------------------------------------------------------------------------------- Patient/Caregiver Education Details Patient Name: [name] HelperIRBY, [name] [name] Date of Service: 04/04/2016 10:00 AM Medical Record Number: [id number] Patient Account Number: [id number] Date of Birth/Gender: 12/08/1985 (30 y.o. Male) Treating RN: Huel CoventryWoody, Kim Primary Care Physician: Bethann PunchesMiller, Mark Other Clinician: Referring Physician: Bethann PunchesMiller, Mark Treating Physician/Extender: Rudene ReBritto, Errol Weeks in Treatment: 2 Education Assessment Education Provided To: Patient Education Topics Provided Wound/Skin Impairment: Handouts: Caring for Your Ulcer, Other: wound care as prescribed Methods: Demonstration, Explain/Verbal Responses: State content correctly Electronic Signature(s) Signed: 04/04/2016 3:26:10 PM By: Elliot GurneyWoody, RN, BSN, Kim RN, BSN Entered By: Elliot GurneyWoody, RN, BSN, Kim on 04/04/2016 10:54:07 [name] HelperIRBY, [name] [name] ([id number]) -------------------------------------------------------------------------------- Wound Assessment Details Patient Name: [name] HelperIRBY, [name] [name] Date of Service: 04/04/2016 10:00 AM Medical Record Number: [id number] Patient Account Number: [id number]  Date of Birth/Sex: 02/06/1986 (30 y.o. Male) Treating RN: Huel Coventry Primary Care Physician: Bethann Punches Other Clinician: Referring Physician: Bethann Punches Treating Physician/Extender: Rudene Re in Treatment: 2 Wound Status Wound Number: 2 Primary Pressure Ulcer Etiology: Wound Location: Right Groin Wound Open Wounding Event: Gradually Appeared Status: Date Acquired: 02/19/2016 Comorbid Hepatitis C, History of pressure Weeks Of Treatment: 2 History: wounds, Paraplegia, Seizure Disorder Clustered Wound: No Photos Wound Measurements Length: (cm) 4 Width: (cm)  0.9 Depth: (cm) 0.1 Area: (cm) 2.827 Volume: (cm) 0.283 % Reduction in Area: -200.1% % Reduction in Volume: -201.1% Epithelialization: Small (1-33%) Tunneling: No Undermining: No Wound Description Classification: Category/Stage II Wound Margin: Flat and Intact Exudate Amount: Medium Exudate Type: Serous Exudate Color: amber Wound Bed Granulation Amount: Medium (34-66%) Exposed Structure Granulation Quality: Pink, Friable Fascia Exposed: No Necrotic Amount: None Present (0%) Fat Layer Exposed: No Tendon Exposed: No Muscle Exposed: No Kernan, [name] [name] ([id number]) Joint Exposed: No Bone Exposed: No Limited to Skin Breakdown Periwound Skin Texture Texture Color No Abnormalities Noted: No No Abnormalities Noted: No Callus: No Atrophie Blanche: No Crepitus: No Cyanosis: No Excoriation: No Ecchymosis: No Fluctuance: No Erythema: No Friable: No Hemosiderin Staining: No Induration: No Mottled: No Localized Edema: No Pallor: No Rash: No Rubor: No Scarring: No Temperature / Pain Moisture Temperature: No Abnormality No Abnormalities Noted: No Dry / Scaly: No Maceration: No Moist: Yes Wound Preparation Ulcer Cleansing: Rinsed/Irrigated with Saline Topical Anesthetic Applied: None Treatment Notes Wound #2 (Right Groin) 1. Cleansed with: Clean wound with Normal Saline 4. Dressing Applied: Other dressing (specify in notes) Notes Drawtex only Electronic Signature(s) Signed: 04/04/2016 3:26:10 PM By: Elliot Gurney, RN, BSN, Kim RN, BSN Entered By: Elliot Gurney, RN, BSN, Kim on 04/04/2016 10:31:55 [name] [name] ([id number]) -------------------------------------------------------------------------------- Wound Assessment Details Patient Name: [name] [name] Date of Service: 04/04/2016 10:00 AM Medical Record Number: [id number] Patient Account Number: [id number] Date of Birth/Sex: October 24, 1985 (30 y.o. Male) Treating RN: Huel Coventry Primary Care Physician: Bethann Punches Other  Clinician: Referring Physician: Bethann Punches Treating Physician/Extender: Rudene Re in Treatment: 2 Wound Status Wound Number: 3 Primary Pressure Ulcer Etiology: Wound Location: Left Groin Wound Open Wounding Event: Gradually Appeared Status: Date Acquired: 03/11/2016 Comorbid Hepatitis C, History of pressure Weeks Of Treatment: 2 History: wounds, Paraplegia, Seizure Disorder Clustered Wound: No Photos Wound Measurements Length: (cm) 4 Width: (cm) 0.8 Depth: (cm) 0.1 Area: (cm) 2.513 Volume: (cm) 0.251 % Reduction in Area: 18% % Reduction in Volume: 18% Epithelialization: Small (1-33%) Wound Description Classification: Category/Stage I Wound Margin: Indistinct, nonvisible Exudate Amount: Medium Exudate Type: Serous Exudate Color: amber Wound Bed Granulation Amount: None Present (0%) Exposed Structure Necrotic Amount: None Present (0%) Fascia Exposed: No Fat Layer Exposed: No Tendon Exposed: No Muscle Exposed: No Espina, [name] [name] ([id number]) Joint Exposed: No Bone Exposed: No Limited to Skin Breakdown Periwound Skin Texture Texture Color No Abnormalities Noted: No No Abnormalities Noted: No Callus: No Atrophie Blanche: No Crepitus: No Cyanosis: No Excoriation: No Ecchymosis: No Fluctuance: No Erythema: No Friable: No Hemosiderin Staining: No Induration: No Mottled: No Localized Edema: No Pallor: No Rash: No Rubor: No Scarring: No Moisture No Abnormalities Noted: No Dry / Scaly: No Maceration: No Moist: Yes Wound Preparation Ulcer Cleansing: Rinsed/Irrigated with Saline Topical Anesthetic Applied: None Treatment Notes Wound #3 (Left Groin) 1. Cleansed with: Clean wound with Normal Saline 4. Dressing Applied: Other dressing (specify in notes) Notes Drawtex only Electronic Signature(s) Signed: 04/04/2016 3:26:10 PM By: Elliot Gurney, RN, BSN, Kim RN, BSN Entered By: Elliot Gurney, RN, BSN, Kim  on 04/04/2016 10:31:20 CLEMONS, SALVUCCI  ([id number]) -------------------------------------------------------------------------------- Vitals Details Patient Name: HASKELL, RIHN Date of Service: 04/04/2016 10:00 AM Medical Record Number: [id number] Patient Account Number: [id number] Date of Birth/Sex: [birth year] (30 y.o. Male) Treating RN: Huel Coventry Primary Care Physician: Bethann Punches Other Clinician: Referring Physician: Bethann Punches Treating Physician/Extender: Rudene Re in Treatment: 2 Vital Signs Time Taken: 10:23 Temperature (F): 97.7 Weight (lbs): 220 Pulse (bpm): 62 Respiratory Rate (breaths/min): 16 Blood Pressure (mmHg): 99/51 Reference Range: 80 - 120 mg / dl Electronic Signature(s) Signed: 04/04/2016 3:26:10 PM By: Elliot Gurney, RN, BSN, Kim RN, BSN Entered By: Elliot Gurney, RN, BSN, Kim on 04/04/2016 10:23:44

## 2016-04-05 NOTE — Progress Notes (Signed)
CORNELIUS, MARULLO ([id number]) Visit Report for 04/04/2016 Chief Complaint Document Details Patient Name: JIBREEL, FEDEWA Date of Service: 04/04/2016 10:00 AM Medical Record Number: [id number] Patient Account Number: [id number] Date of Birth/Sex: 11-18-1985 (30 y.o. Male) Treating RN: Huel Coventry Primary Care Physician: Bethann Punches Other Clinician: Referring Physician: Bethann Punches Treating Physician/Extender: Rudene Re in Treatment: 2 Information Obtained from: Caregiver Chief Complaint Patient is at the clinic for treatment of an open wounds to the right more than left groin for about a month Electronic Signature(s) Signed: 04/04/2016 10:40:39 AM By: Evlyn Kanner MD, FACS Entered By: Evlyn Kanner on 04/04/2016 10:40:39 [name] ([id number]) -------------------------------------------------------------------------------- HPI Details Patient Name: [name] Date of Service: 04/04/2016 10:00 AM Medical Record Number: [id number] Patient Account Number: [id number] Date of Birth/Sex: 01/17/1986 (30 y.o. Male) Treating RN: Huel Coventry Primary Care Physician: Bethann Punches Other Clinician: Referring Physician: Bethann Punches Treating Physician/Extender: Rudene Re in Treatment: 2 History of Present Illness Location: right more than left groin Quality: Patient reports No Pain. Severity: Patient states wound are getting worse. Duration: Patient has had the wound for > 1 months prior to seeking treatment at the wound center Context: The wound appeared gradually over time HPI Description: 30 year old male who has a history of traumatic brain injury and also has a ulcer on his left heel which was noted by the family about 4-5 months ago. The TBI was in January 2013 and prior to this visit the patient has had hyperbaric oxygen therapy approximately 2 years after his TBI. He is had several gastrostomy tube placed by the gastroenterologist but then finally had a surgical procedure to  bring the gastric mucosa to the skin surface. This was done approximately 9 months ago. Past medical history of hepatitis C, traumatic brain injury, pulmonary emboli, ileostomy, tracheostomy, PEG tube placement. Was a former smoker and quit in January 2013 He known to see [place] for various issues with wounds and this time around for about a month he has open areas near his groin possibly due to the contracture. No history of fungal infection of the groin. 04/04/2016 -- his caregiver says there is a lot of drainage and she has to often change it more than once a day because of the amount of drainage in this area. Electronic Signature(s) Signed: 04/04/2016 10:41:08 AM By: Evlyn Kanner MD, FACS Entered By: Evlyn Kanner on 04/04/2016 10:41:07 [name] ([id number]) -------------------------------------------------------------------------------- Physical Exam Details Patient Name: [name] Date of Service: 04/04/2016 10:00 AM Medical Record Number: [id number] Patient Account Number: [id number] Date of Birth/Sex: Jul 11, 1986 (30 y.o. Male) Treating RN: Huel Coventry Primary Care Physician: Bethann Punches Other Clinician: Referring Physician: Bethann Punches Treating Physician/Extender: Rudene Re in Treatment: 2 Constitutional . Pulse regular. Respirations normal and unlabored. Afebrile. . Eyes Nonicteric. Reactive to light. Ears, Nose, Mouth, and Throat Lips, teeth, and gums WNL.Marland Kitchen Moist mucosa without lesions. Neck supple and nontender. No palpable supraclavicular or cervical adenopathy. Normal sized without goiter. Respiratory WNL. No retractions.. Breath sounds WNL, No rubs, rales, rhonchi, or wheeze.. Cardiovascular Heart rhythm and rate regular, no murmur or gallop.. Pedal Pulses WNL. No clubbing, cyanosis or edema. Lymphatic No adneopathy. No adenopathy. No adenopathy. Musculoskeletal Adexa without tenderness or enlargement.. Digits and nails w/o clubbing, cyanosis, infection,  petechiae, ischemia, or inflammatory conditions.. Integumentary (Hair, Skin) No suspicious lesions. No crepitus or fluctuance. No peri-wound warmth or erythema. No masses.Marland Kitchen Psychiatric Judgement and insight Intact.. No evidence of depression, anxiety, or agitation.. Notes wound on the right groin continues to  be fairly moist but there is no cellulitis around it. The left groin has more superficial fissure and no debridement was required. Electronic Signature(s) Signed: 04/04/2016 10:41:43 AM By: Evlyn Kanner MD, FACS Entered By: Evlyn Kanner on 04/04/2016 10:41:42 [name] ([id number]) -------------------------------------------------------------------------------- Physician Orders Details Patient Name: [name] Date of Service: 04/04/2016 10:00 AM Medical Record Number: [id number] Patient Account Number: [id number] Date of Birth/Sex: February 01, 1986 (30 y.o. Male) Treating RN: Clover Mealy, RN, BSN, Shively Sink Primary Care Physician: Bethann Punches Other Clinician: Referring Physician: Bethann Punches Treating Physician/Extender: Rudene Re in Treatment: 2 Verbal / Phone Orders: Yes Clinician: Afful, RN, BSN, Rita Read Back and Verified: Yes Diagnosis Coding Wound Cleansing Wound #2 Right Groin o Clean wound with Normal Saline. Wound #3 Left Groin o Clean wound with Normal Saline. Anesthetic Wound #2 Right Groin o Topical Lidocaine 4% cream applied to wound bed prior to debridement Wound #3 Left Groin o Topical Lidocaine 4% cream applied to wound bed prior to debridement Primary Wound Dressing Wound #2 Right Groin o Drawtex Wound #3 Left Groin o Drawtex Dressing Change Frequency Wound #2 Right Groin o Change dressing every other day. Wound #3 Left Groin o Change dressing every other day. Follow-up Appointments Wound #2 Right Groin o Return Appointment in 2 weeks. Wound #3 Left Groin o Return Appointment in 2 weeks. JANSON, LAMAR  ([id number]) Notes Caregiver to call clinic if drawtex is preferrabe over aquacel ag. Then we will order drawtex for them. Electronic Signature(s) Signed: 04/04/2016 4:26:59 PM By: Evlyn Kanner MD, FACS Signed: 04/04/2016 5:31:48 PM By: Elpidio Eric BSN, RN Entered By: Elpidio Eric on 04/04/2016 10:39:31 [name] ([id number]) -------------------------------------------------------------------------------- Problem List Details Patient Name: [name] Date of Service: 04/04/2016 10:00 AM Medical Record Number: [id number] Patient Account Number: [id number] Date of Birth/Sex: 1985-11-07 (30 y.o. Male) Treating RN: Huel Coventry Primary Care Physician: Bethann Punches Other Clinician: Referring Physician: Bethann Punches Treating Physician/Extender: Rudene Re in Treatment: 2 Active Problems ICD-10 Encounter Code Description Active Date Diagnosis Z87.820 Personal history of traumatic brain injury 03/21/2016 Yes S31.501A Unspecified open wound of unspecified external genital 03/21/2016 Yes organs, male, initial encounter Inactive Problems Resolved Problems Electronic Signature(s) Signed: 04/04/2016 10:40:32 AM By: Evlyn Kanner MD, FACS Entered By: Evlyn Kanner on 04/04/2016 10:40:32 [name] ([id number]) -------------------------------------------------------------------------------- Progress Note Details Patient Name: [name] Date of Service: 04/04/2016 10:00 AM Medical Record Number: [id number] Patient Account Number: [id number] Date of Birth/Sex: Feb 23, 1986 (30 y.o. Male) Treating RN: Huel Coventry Primary Care Physician: Bethann Punches Other Clinician: Referring Physician: Bethann Punches Treating Physician/Extender: Rudene Re in Treatment: 2 Subjective Chief Complaint Information obtained from Caregiver Patient is at the clinic for treatment of an open wounds to the right more than left groin for about a month History of Present Illness (HPI) The following HPI  elements were documented for the patient's wound: Location: right more than left groin Quality: Patient reports No Pain. Severity: Patient states wound are getting worse. Duration: Patient has had the wound for > 1 months prior to seeking treatment at the wound center Context: The wound appeared gradually over time 30 year old male who has a history of traumatic brain injury and also has a ulcer on his left heel which was noted by the family about 4-5 months ago. The TBI was in January 2013 and prior to this visit the patient has had hyperbaric oxygen therapy approximately 2 years after his TBI. He is had several gastrostomy tube placed by the gastroenterologist but then finally had a surgical  procedure to bring the gastric mucosa to the skin surface. This was done approximately 9 months ago. Past medical history of hepatitis C, traumatic brain injury, pulmonary emboli, ileostomy, tracheostomy, PEG tube placement. Was a former smoker and quit in January 2013 He known to see us for various issues with wounds and this time around for about a month he has open areas near his groin possibly due to the contracture. No history of fungal infection of the groin. 04/04/2016 -- his caregiver says there is a lot of drainage and she has to often change it more than once a day because of the amount of drainage in this area. Objective Constitutional Pulse regular. Respirations normal and unlabored. Afebrile. Vitals Time Taken: 10:23 AM, Weight: 220 lbs, Temperature: 97.7 F, Pulse: 62 bpm, Respiratory Rate: 16 breaths/min, Blood Pressure: 99/51 mmHg. [name], [place] ([id number]) Eyes Nonicteric. Reactive to light. Ears, Nose, Mouth, and Throat Lips, teeth, and gums WNL.Marland Kitchen. Moist mucosa without lesions. Neck supple and nontender. No palpable supraclavicular or cervical adenopathy. Normal sized without goiter. Respiratory WNL. No retractions.. Breath sounds WNL, No rubs, rales, rhonchi, or  wheeze.. Cardiovascular Heart rhythm and rate regular, no murmur or gallop.. Pedal Pulses WNL. No clubbing, cyanosis or edema. Lymphatic No adneopathy. No adenopathy. No adenopathy. Musculoskeletal Adexa without tenderness or enlargement.. Digits and nails w/o clubbing, cyanosis, infection, petechiae, ischemia, or inflammatory conditions.Marland Kitchen. Psychiatric Judgement and insight Intact.. No evidence of depression, anxiety, or agitation.. General Notes: wound on the right groin continues to be fairly moist but there is no cellulitis around it. The left groin has more superficial fissure and no debridement was required. Integumentary (Hair, Skin) No suspicious lesions. No crepitus or fluctuance. No peri-wound warmth or erythema. No masses.. Wound #2 status is Open. Original cause of wound was Gradually Appeared. The wound is located on the Right Groin. The wound measures 4cm length x 0.9cm width x 0.1cm depth; 2.827cm^2 area and 0.283cm^3 volume. The wound is limited to skin breakdown. There is no tunneling or undermining noted. There is a medium amount of serous drainage noted. The wound margin is flat and intact. There is medium (34-66%) pink, friable granulation within the wound bed. There is no necrotic tissue within the wound bed. The periwound skin appearance exhibited: Moist. The periwound skin appearance did not exhibit: Callus, Crepitus, Excoriation, Fluctuance, Friable, Induration, Localized Edema, Rash, Scarring, Dry/Scaly, Maceration, Atrophie Blanche, Cyanosis, Ecchymosis, Hemosiderin Staining, Mottled, Pallor, Rubor, Erythema. Periwound temperature was noted as No Abnormality. Wound #3 status is Open. Original cause of wound was Gradually Appeared. The wound is located on the Left Groin. The wound measures 4cm length x 0.8cm width x 0.1cm depth; 2.513cm^2 area and 0.251cm^3 volume. The wound is limited to skin breakdown. There is a medium amount of serous drainage noted. The wound  margin is indistinct and nonvisible. There is no granulation within the wound bed. There is no necrotic tissue within the wound bed. The periwound skin appearance exhibited: Moist. The periwound skin appearance did not exhibit: Callus, Crepitus, Excoriation, Fluctuance, Friable, Induration, Localized Edema, Rash, Scarring, Dry/Scaly, Maceration, Atrophie Blanche, Cyanosis, Ecchymosis, Hemosiderin Staining, [place]heis, [place]ub ([id number]) Mottled, Pallor, Rubor, Erythema. Assessment Active Problems ICD-10 Z87.820 - Personal history of traumatic brain injury S31.501A - Unspecified open wound of unspecified external genital organs, male, initial encounter Plan Wound Cleansing: Wound #2 Right Groin: Clean wound with Normal Saline. Wound #3 Left Groin: Clean wound with Normal Saline. Anesthetic: Wound #2 Right Groin: Topical Lidocaine 4% cream applied to wound bed prior to  debridement Wound #3 Left Groin: Topical Lidocaine 4% cream applied to wound bed prior to debridement Primary Wound Dressing: Wound #2 Right Groin: Drawtex Wound #3 Left Groin: Drawtex Dressing Change Frequency: Wound #2 Right Groin: Change dressing every other day. Wound #3 Left Groin: Change dressing every other day. Follow-up Appointments: Wound #2 Right Groin: Return Appointment in 2 weeks. Wound #3 Left Groin: Return Appointment in 2 weeks. General Notes: Caregiver to call clinic if drawtex is preferrabe over aquacel ag. Then we will order drawtex for them. PAMELA, MADDY ([id number]) I have recommended using some Drawtex in the creases of the groin and keeping this in place without any tape. This should be changed as often as needed but may be left on for a day or 2 at a time if not soiled. Offloading, diet and other precautions have been discussed with the caregivers. They wanted to know if hyperbaric oxygen therapy would be an option for him and I have explained why this is not an indication for  treatment They will come back to visit [place] once in every 2-3 weeks. Electronic Signature(s) Signed: 04/04/2016 10:42:57 AM By: Evlyn Kanner MD, FACS Entered By: Evlyn Kanner on 04/04/2016 10:42:57 [name] ([id number]) -------------------------------------------------------------------------------- SuperBill Details Patient Name: [name] Date of Service: 04/04/2016 Medical Record Number: [id number] Patient Account Number: [id number] Date of Birth/Sex: 09/02/1985 (30 y.o. Male) Treating RN: Huel Coventry Primary Care Physician: Bethann Punches Other Clinician: Referring Physician: Bethann Punches Treating Physician/Extender: Rudene Re in Treatment: 2 Diagnosis Coding ICD-10 Codes Code Description [phone] Personal history of traumatic brain injury S31.501A Unspecified open wound of unspecified external genital organs, male, initial encounter Facility Procedures CPT4 Code: [id number] Description: [id number] - WOUND CARE VISIT-LEV 3 EST PT Modifier: Quantity: 1 Physician Procedures CPT4: Description Modifier Quantity Code [id number] - WC PHYS LEVEL 3 - EST PT 1 ICD-10 Description Diagnosis Z87.820 Personal history of traumatic brain injury S31.501A Unspecified open wound of unspecified external genital organs, male, initial  encounter Electronic Signature(s) Signed: 04/05/2016 11:11:16 AM By: Elpidio Eric BSN, RN Previous Signature: 04/04/2016 10:43:09 AM Version By: Evlyn Kanner MD, FACS Entered By: Elpidio Eric on 04/05/2016 11:11:16

## 2016-04-07 IMAGING — CT CT ANGIO CHEST
2 of 6 series · 18 of 36 positions shown · IV contrast (APPLIED)
Comparison: 05/10/2014

CLINICAL DATA: Hypoxia, increasing shortness of breath upper past
week with apneic episodes last night, off [place] for 1 week,
new LEFT leg swelling, history paraplegia, pulmonary embolism and
DVT, IVC filter, aspiration pneumonia

EXAM:
CT ANGIOGRAPHY CHEST WITH CONTRAST
TECHNIQUE: Multidetector CT imaging of the chest was performed using the
standard protocol during bolus administration of intravenous
contrast. Multiplanar CT image reconstructions and MIPs were
obtained to evaluate the vascular anatomy.
CONTRAST:  65 cc Isovue 370 IV

[Series 5: pe 1.0 thins · axial · 0.72mm/px · z∈[-278,-90]mm · 17 of 212 slices shown]
[im 12/212  lung]
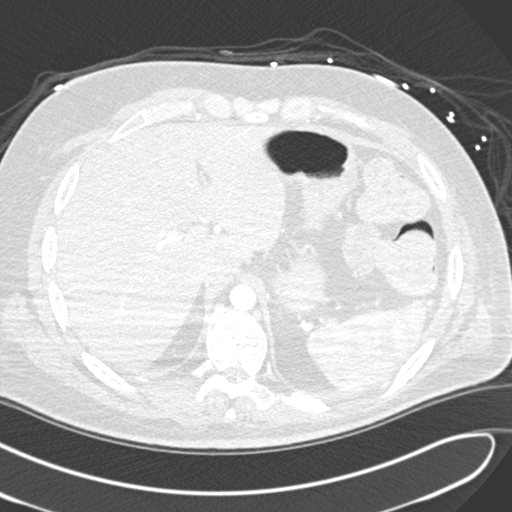
[im 24/212  mediastinal]
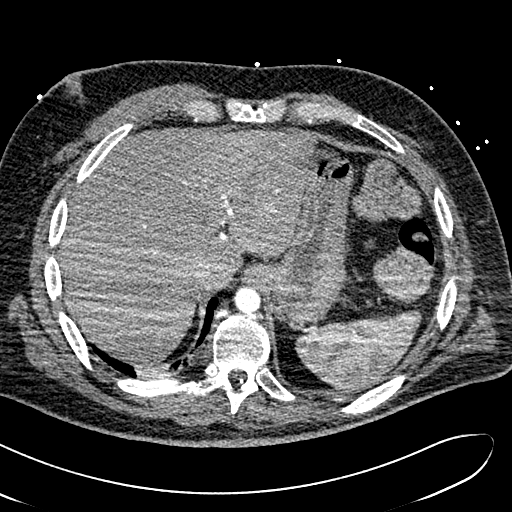
[im 36/212  lung]
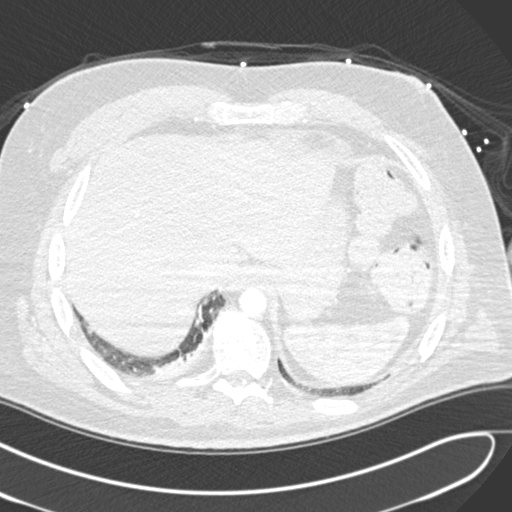
[im 47/212  mediastinal]
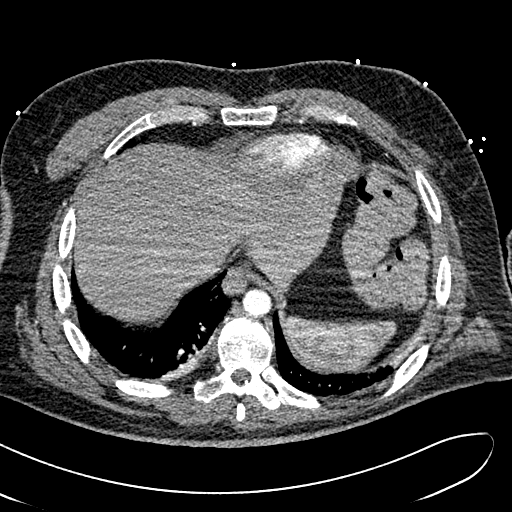
[im 59/212  lung]
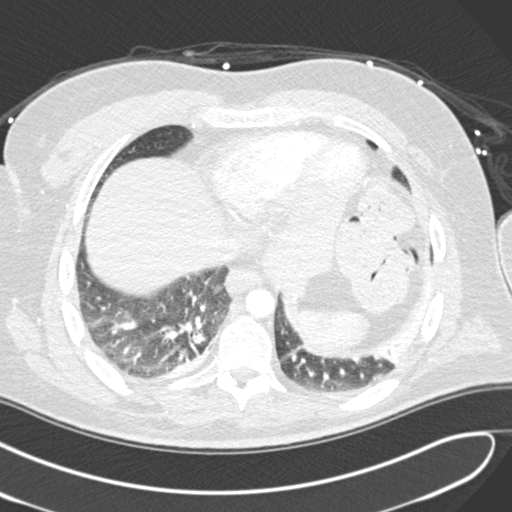
[im 71/212  mediastinal]
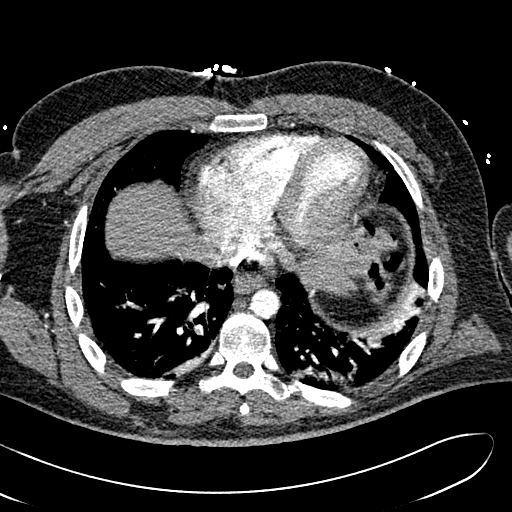
[im 83/212  lung]
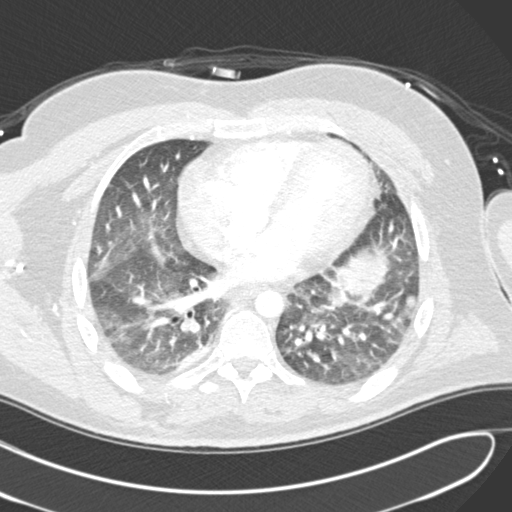
[im 94/212  mediastinal]
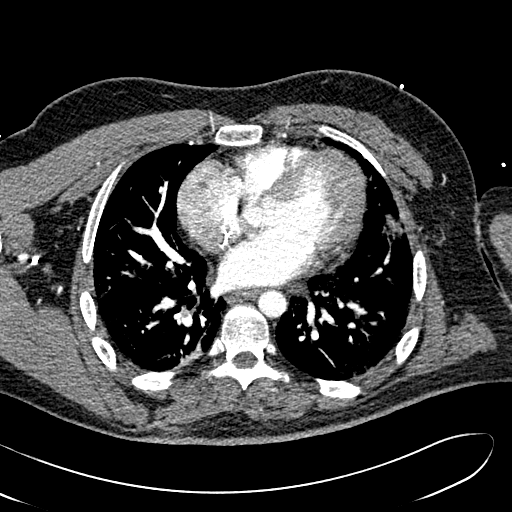
[im 106/212  lung]
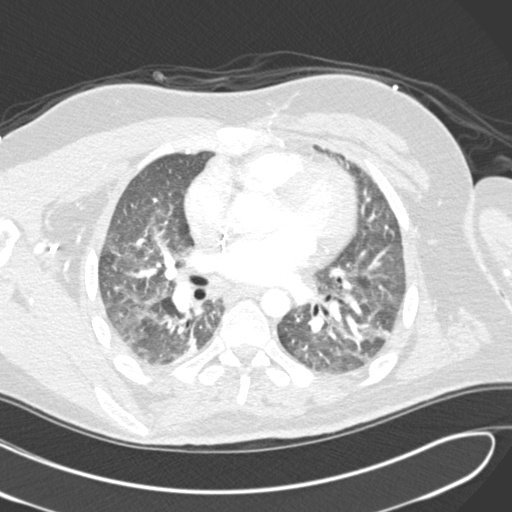
[im 118/212  mediastinal]
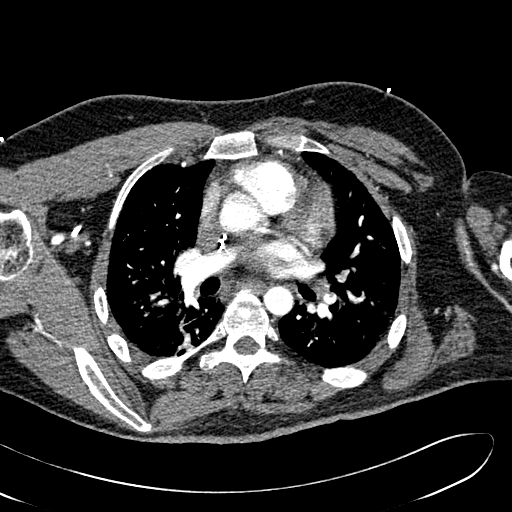
[im 129/212  lung]
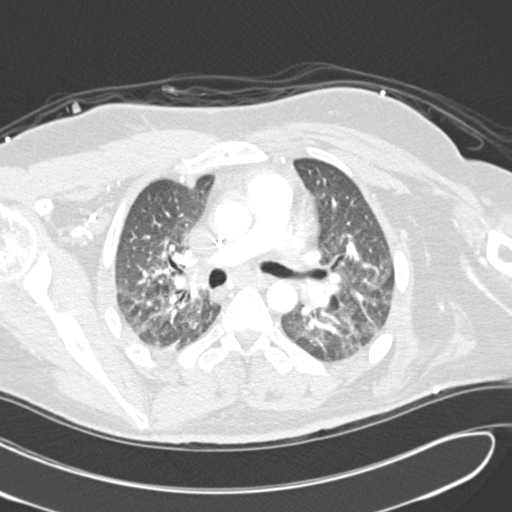
[im 141/212  mediastinal]
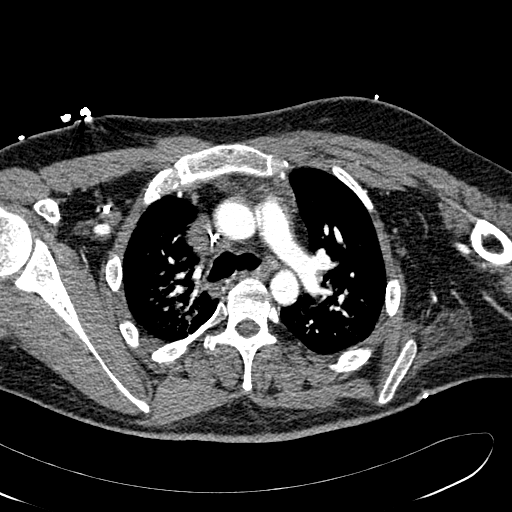
[im 153/212  lung]
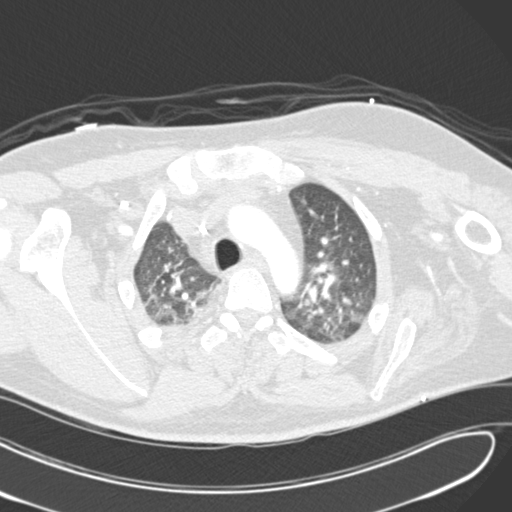
[im 165/212  mediastinal]
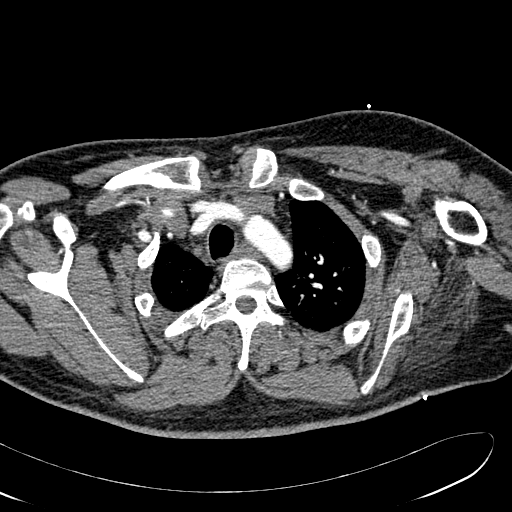
[im 176/212  lung]
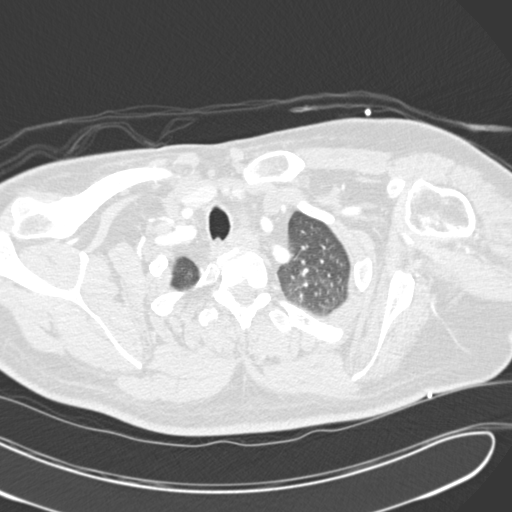
[im 188/212  mediastinal]
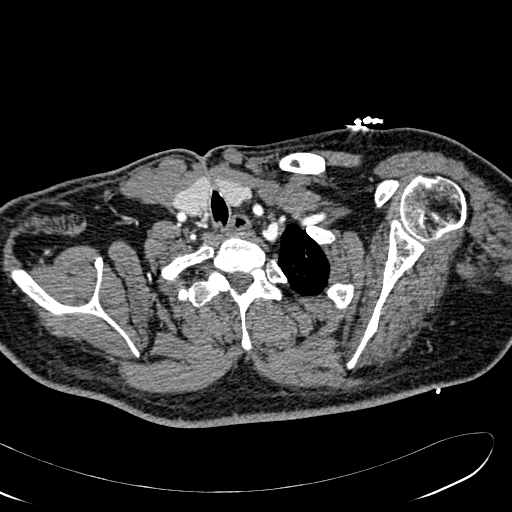
[im 200/212  lung]
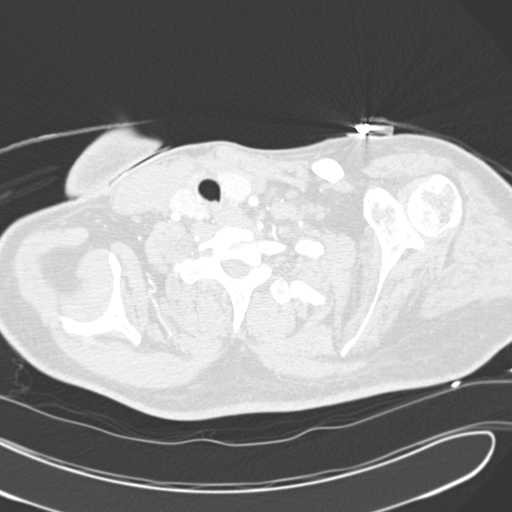

[Series 7: cor pe 2.0 mpr · coronal · 0.43mm/px · 1 of 126 slices shown]
[im 63/126  mediastinal]
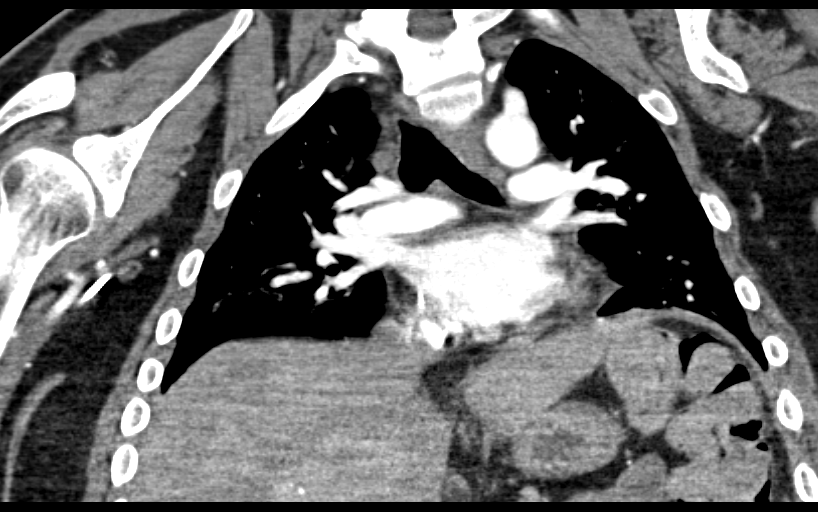

[18 of 36 positions shown; findings below may reference images not displayed]

FINDINGS: Aorta normal caliber without aneurysm or dissection.

Filling defects identified within BILATERAL lower lobe pulmonary
arteries consistent with pulmonary emboli new since 05/10/2014.

Visualized upper abdominal structures unremarkable.

Scattered artifacts at chest and abdomen from inclusion of patient's
arms in imaged field.

No thoracic adenopathy.

Low lung volumes with scattered areas of atelectasis in both lungs.

No gross infiltrate, pleural effusion or pneumothorax.

RIGHT arm PICC line tip at approximately the cavoatrial junction.

No acute osseous findings.

Review of the MIP images confirms the above findings.
IMPRESSION: Pulmonary emboli are present in BILATERAL lower lobe pulmonary
arteries new since 05/10/2014.

Low lung volumes with scattered areas of atelectasis.

Critical Value/emergent results were called by telephone at the time
of interpretation on 06/13/2014 at 3646 hrs to Dr. Pititto, who
verbally acknowledged these results.

## 2016-04-18 ENCOUNTER — Encounter: Payer: 59 | Admitting: Surgery

## 2016-04-18 DIAGNOSIS — S31501A Unspecified open wound of unspecified external genital organs, male, initial encounter: Secondary | ICD-10-CM | POA: Diagnosis not present

## 2016-04-18 NOTE — Progress Notes (Signed)
[name] [name], [name] [name] ([id number]) Visit Report for 04/18/2016 Arrival Information Details Patient Name: [name] [name], [name] [name] Date of Service: 04/18/2016 10:00 AM Medical Record Number: [id number] Patient Account Number: [id number] Date of Birth/Sex: April 14, 1986 (30 y.o. Male) Treating RN: Clover Mealy, RN, BSN, [place] Sink Primary Care Physician: Bethann Punches Other Clinician: Referring Physician: Bethann Punches Treating Physician/Extender: Rudene Re in Treatment: 4 Visit Information History Since Last Visit All ordered tests and consults were completed: No Patient Arrived: Wheel Chair Added or deleted any medications: No Arrival Time: 10:17 Any new allergies or adverse reactions: No Accompanied By: caregiver Had a fall or experienced change in No activities of daily living that may affect Transfer Assistance: None risk of falls: Patient Identification Verified: Yes Signs or symptoms of abuse/neglect since last No Secondary Verification Process Yes visito Completed: Hospitalized since last visit: No Patient Requires Transmission-Based No Has Dressing in Place as Prescribed: Yes Precautions: Pain Present Now: No Patient Has Alerts: No Electronic Signature(s) Signed: 04/18/2016 2:51:11 PM By: Elpidio Eric BSN, RN Entered By: Elpidio Eric on 04/18/2016 10:18:00 [name] [name] ([id number]) -------------------------------------------------------------------------------- Clinic Level of Care Assessment Details Patient Name: [name] [name] Date of Service: 04/18/2016 10:00 AM Medical Record Number: [id number] Patient Account Number: [id number] Date of Birth/Sex: 23-Dec-1985 (30 y.o. Male) Treating RN: Clover Mealy, RN, BSN, Rita Primary Care Physician: Bethann Punches Other Clinician: Referring Physician: Bethann Punches Treating Physician/Extender: Rudene Re in Treatment: 4 Clinic Level of Care Assessment Items TOOL 4 Quantity Score []  - Use when only an EandM is performed on FOLLOW-UP visit 0 ASSESSMENTS -  Nursing Assessment / Reassessment X - Reassessment of Co-morbidities (includes updates in patient status) 1 10 X - Reassessment of Adherence to Treatment Plan 1 5 ASSESSMENTS - Wound and Skin Assessment / Reassessment X - Simple Wound Assessment / Reassessment - one wound 1 5 []  - Complex Wound Assessment / Reassessment - multiple wounds 0 []  - Dermatologic / Skin Assessment (not related to wound area) 0 ASSESSMENTS - Focused Assessment []  - Circumferential Edema Measurements - multi extremities 0 []  - Nutritional Assessment / Counseling / Intervention 0 []  - Lower Extremity Assessment (monofilament, tuning fork, pulses) 0 []  - Peripheral Arterial Disease Assessment (using hand held doppler) 0 ASSESSMENTS - Ostomy and/or Continence Assessment and Care []  - Incontinence Assessment and Management 0 []  - Ostomy Care Assessment and Management (repouching, etc.) 0 PROCESS - Coordination of Care X - Simple Patient / Family Education for ongoing care 1 15 []  - Complex (extensive) Patient / Family Education for ongoing care 0 []  - Staff obtains Chiropractor, Records, Test Results / Process Orders 0 []  - Staff telephones HHA, Nursing Homes / Clarify orders / etc 0 []  - Routine Transfer to another Facility (non-emergent condition) 0 [name] [name], [name] [name] ([id number]) []  - Routine Hospital Admission (non-emergent condition) 0 []  - New Admissions / Manufacturing engineer / Ordering NPWT, Apligraf, etc. 0 []  - Emergency Hospital Admission (emergent condition) 0 []  - Simple Discharge Coordination 0 []  - Complex (extensive) Discharge Coordination 0 PROCESS - Special Needs []  - Pediatric / Minor Patient Management 0 []  - Isolation Patient Management 0 []  - Hearing / Language / Visual special needs 0 []  - Assessment of Community assistance (transportation, D/C planning, etc.) 0 []  - Additional assistance / Altered mentation 0 []  - Support Surface(s) Assessment (bed, cushion, seat, etc.) 0 INTERVENTIONS - Wound  Cleansing / Measurement X - Simple Wound Cleansing - one wound 1 5 []  - Complex Wound Cleansing - multiple wounds 0 X - Wound Imaging (photographs -  any number of wounds) 1 5 []  - Wound Tracing (instead of photographs) 0 X - Simple Wound Measurement - one wound 1 5 []  - Complex Wound Measurement - multiple wounds 0 INTERVENTIONS - Wound Dressings X - Small Wound Dressing one or multiple wounds 1 10 []  - Medium Wound Dressing one or multiple wounds 0 []  - Large Wound Dressing one or multiple wounds 0 []  - Application of Medications - topical 0 []  - Application of Medications - injection 0 INTERVENTIONS - Miscellaneous []  - External ear exam 0 [name], [name] ([id number]) []  - Specimen Collection (cultures, biopsies, blood, body fluids, etc.) 0 []  - Specimen(s) / Culture(s) sent or taken to Lab for analysis 0 []  - Patient Transfer (multiple staff / Michiel Sites Lift / Similar devices) 0 []  - Simple Staple / Suture removal (25 or less) 0 []  - Complex Staple / Suture removal (26 or more) 0 []  - Hypo / Hyperglycemic Management (close monitor of Blood Glucose) 0 []  - Ankle / Brachial Index (ABI) - do not check if billed separately 0 X - Vital Signs 1 5 Has the patient been seen at the hospital within the last three years: Yes Total Score: 65 Level Of Care: New/Established - Level 2 Electronic Signature(s) Signed: 04/18/2016 2:51:11 PM By: Elpidio Eric BSN, RN Entered By: Elpidio Eric on 04/18/2016 10:43:37 [name] [name] ([id number]) -------------------------------------------------------------------------------- Encounter Discharge Information Details Patient Name: [name] [name] Date of Service: 04/18/2016 10:00 AM Medical Record Number: [id number] Patient Account Number: [id number] Date of Birth/Sex: [birth year] (30 y.o. Male) Treating RN: Clover Mealy, RN, BSN, [place] Sink Primary Care Physician: Bethann Punches Other Clinician: Referring Physician: Bethann Punches Treating Physician/Extender: Rudene Re in  Treatment: 4 Encounter Discharge Information Items Discharge Pain Level: 0 Discharge Condition: Stable Ambulatory Status: Wheelchair Discharge Destination: Home Transportation: Private Auto Accompanied By: caregivers Schedule Follow-up Appointment: No Medication Reconciliation completed and provided to Patient/Care No Vishwa Dais: Provided on Clinical Summary of Care: 04/18/2016 Form Type Recipient Paper Patient ZI Electronic Signature(s) Signed: 04/18/2016 2:51:11 PM By: Elpidio Eric BSN, RN Previous Signature: 04/18/2016 10:44:17 AM Version By: Gwenlyn Perking Entered By: Elpidio Eric on 04/18/2016 10:46:05 [name] [name] ([id number]) -------------------------------------------------------------------------------- Lower Extremity Assessment Details Patient Name: [name] [name] Date of Service: 04/18/2016 10:00 AM Medical Record Number: [id number] Patient Account Number: [id number] Date of Birth/Sex: 05-02-1986 (30 y.o. Male) Treating RN: Clover Mealy, RN, BSN, Rita Primary Care Physician: Bethann Punches Other Clinician: Referring Physician: Bethann Punches Treating Physician/Extender: Rudene Re in Treatment: 4 Electronic Signature(s) Signed: 04/18/2016 2:51:11 PM By: Elpidio Eric BSN, RN Entered By: Elpidio Eric on 04/18/2016 10:20:49 [name] [name] ([id number]) -------------------------------------------------------------------------------- Multi Wound Chart Details Patient Name: [name] [name] Date of Service: 04/18/2016 10:00 AM Medical Record Number: [id number] Patient Account Number: [id number] Date of Birth/Sex: [birth year] (30 y.o. Male) Treating RN: Clover Mealy, RN, BSN, Rita Primary Care Physician: Bethann Punches Other Clinician: Referring Physician: Bethann Punches Treating Physician/Extender: Rudene Re in Treatment: 4 Vital Signs Height(in): Pulse(bpm): 75 Weight(lbs): 220 Blood Pressure 118/62 (mmHg): Body Mass Index(BMI): Temperature(F): 98.3 Respiratory  Rate 18 (breaths/min): Photos: [2:No Photos] [3:No Photos] [N/A:N/A] Wound Location: [2:Right Groin] [3:Left Groin] [N/A:N/A] Wounding Event: [2:Gradually Appeared] [3:Gradually Appeared] [N/A:N/A] Primary Etiology: [2:Pressure Ulcer] [3:Pressure Ulcer] [N/A:N/A] Comorbid History: [2:Hepatitis C, History of pressure wounds, Paraplegia, Seizure Disorder] [3:Hepatitis C, History of pressure wounds, Paraplegia, Seizure Disorder] [N/A:N/A] Date Acquired: [2:02/19/2016] [3:03/11/2016] [N/A:N/A] Weeks of Treatment: [2:4] [3:4] [N/A:N/A] Wound Status: [2:Open] [3:Healed - Epithelialized] [N/A:N/A] Measurements L x W x D 1.5x0.5x0.2 [3:0x0x0] [N/A:N/A] (cm) Area (cm) : [2:0.589] [  3:0] [N/A:N/A] Volume (cm) : [2:0.118] [3:0] [N/A:N/A] % Reduction in Area: [2:37.50%] [3:100.00%] [N/A:N/A] % Reduction in Volume: -25.50% [3:100.00%] [N/A:N/A] Classification: [2:Category/Stage II] [3:Category/Stage I] [N/A:N/A] Exudate Amount: [2:Medium] [3:None Present] [N/A:N/A] Exudate Type: [2:Serous] [3:N/A] [N/A:N/A] Exudate Color: [2:amber] [3:N/A] [N/A:N/A] Wound Margin: [2:Flat and Intact] [3:Indistinct, nonvisible] [N/A:N/A] Granulation Amount: [2:Medium (34-66%)] [3:None Present (0%)] [N/A:N/A] Granulation Quality: [2:Pink, Friable] [3:N/A] [N/A:N/A] Necrotic Amount: [2:None Present (0%)] [3:None Present (0%)] [N/A:N/A] Exposed Structures: [2:Fascia: No Fat: No Tendon: No Muscle: No Joint: No] [3:Fascia: No Fat: No Tendon: No Muscle: No Joint: No] [N/A:N/A] Bone: No Bone: No Limited to Skin Limited to Skin Breakdown Breakdown Epithelialization: Small (1-33%) Large (67-100%) N/A Periwound Skin Texture: Edema: No Edema: No N/A Excoriation: No Excoriation: No Induration: No Induration: No Callus: No Callus: No Crepitus: No Crepitus: No Fluctuance: No Fluctuance: No Friable: No Friable: No Rash: No Rash: No Scarring: No Scarring: No Periwound Skin Moist: Yes Dry/Scaly: Yes  N/A Moisture: Maceration: No Maceration: No Dry/Scaly: No Moist: No Periwound Skin Color: Atrophie Blanche: No Atrophie Blanche: No N/A Cyanosis: No Cyanosis: No Ecchymosis: No Ecchymosis: No Erythema: No Erythema: No Hemosiderin Staining: No Hemosiderin Staining: No Mottled: No Mottled: No Pallor: No Pallor: No Rubor: No Rubor: No Temperature: No Abnormality No Abnormality N/A Tenderness on No No N/A Palpation: Wound Preparation: Ulcer Cleansing: Ulcer Cleansing: N/A Rinsed/Irrigated with Rinsed/Irrigated with Saline Saline Topical Anesthetic Topical Anesthetic Applied: None Applied: None Treatment Notes Electronic Signature(s) Signed: 04/18/2016 2:51:11 PM By: Elpidio Eric BSN, RN Entered By: Elpidio Eric on 04/18/2016 10:42:43 [name] [name] ([id number]) -------------------------------------------------------------------------------- Multi-Disciplinary Care Plan Details Patient Name: [name] [name] Date of Service: 04/18/2016 10:00 AM Medical Record Number: [id number] Patient Account Number: [id number] Date of Birth/Sex: [birth year] (30 y.o. Male) Treating RN: Clover Mealy, RN, BSN, Rita Primary Care Physician: Bethann Punches Other Clinician: Referring Physician: Bethann Punches Treating Physician/Extender: Rudene Re in Treatment: 4 Active Inactive Orientation to the Wound Care Program Nursing Diagnoses: Knowledge deficit related to the wound healing center program Goals: Patient/caregiver will verbalize understanding of the Wound Healing Center Program Date Initiated: 03/21/2016 Goal Status: Active Interventions: Provide education on orientation to the wound center Notes: Pressure Nursing Diagnoses: Knowledge deficit related to management of pressures ulcers Goals: Patient will remain free of pressure ulcers Date Initiated: 03/21/2016 Goal Status: Active Interventions: Assess: immobility, friction, shearing, incontinence upon admission and as  needed Notes: Wound/Skin Impairment Nursing Diagnoses: Impaired tissue integrity Goals: Ulcer/skin breakdown will have a volume reduction of 30% by week 4 Date Initiated: 03/21/2016 [name] [name] ([id number]) Goal Status: Active Interventions: Assess patient/caregiver ability to perform ulcer/skin care regimen upon admission and as needed Notes: Electronic Signature(s) Signed: 04/18/2016 2:51:11 PM By: Elpidio Eric BSN, RN Entered By: Elpidio Eric on 04/18/2016 10:42:30 [name] [name] ([id number]) -------------------------------------------------------------------------------- Pain Assessment Details Patient Name: [name] [name] Date of Service: 04/18/2016 10:00 AM Medical Record Number: [id number] Patient Account Number: [id number] Date of Birth/Sex: 1986-02-29 (30 y.o. Male) Treating RN: Clover Mealy, RN, BSN, Rita Primary Care Physician: Bethann Punches Other Clinician: Referring Physician: Bethann Punches Treating Physician/Extender: Rudene Re in Treatment: 4 Active Problems Location of Pain Severity and Description of Pain Patient Has Paino No Site Locations With Dressing Change: No Pain Management and Medication Current Pain Management: Electronic Signature(s) Signed: 04/18/2016 2:51:11 PM By: Elpidio Eric BSN, RN Entered By: Elpidio Eric on 04/18/2016 10:20:15 [name] [name] ([id number]) -------------------------------------------------------------------------------- Patient/Caregiver Education Details Patient Name: [name] [name] Date of Service: 04/18/2016 10:00 AM Medical Record Number: [id number] Patient Account Number: [id number] Date of Birth/Gender:  06/05/1986 (30 y.o. Male) Treating RN: Afful, RN, BSN, Rita Primary Care Physician: Bethann PunchesMiller, Mark Other Clinician: Referring Physician: Bethann PunchesMiller, Mark Treating Physician/Extender: Rudene ReBritto, Errol Weeks in Treatment: 4 Education Assessment Education Provided To: Patient Education Topics Provided Welcome To The Wound Care  Center: Methods: Explain/Verbal Responses: State content correctly Wound/Skin Impairment: Methods: Explain/Verbal Responses: State content correctly Electronic Signature(s) Signed: 04/18/2016 2:51:11 PM By: Elpidio EricAfful, Rita BSN, RN Entered By: Elpidio EricAfful, Rita on 04/18/2016 10:46:18 [name] HelperIRBY, [name] ([id number]) -------------------------------------------------------------------------------- Wound Assessment Details Patient Name: [name] HelperIRBY, [name] Date of Service: 04/18/2016 10:00 AM Medical Record Number: [id number] Patient Account Number: [id number] Date of Birth/Sex: 12/29/1985 (30 y.o. Male) Treating RN: Clover MealyAfful, RN, BSN, Rita Primary Care Physician: Bethann PunchesMiller, Mark Other Clinician: Referring Physician: Bethann PunchesMiller, Mark Treating Physician/Extender: Rudene ReBritto, Errol Weeks in Treatment: 4 Wound Status Wound Number: 2 Primary Pressure Ulcer Etiology: Wound Location: Right Groin Wound Open Wounding Event: Gradually Appeared Status: Date Acquired: 02/19/2016 Comorbid Hepatitis C, History of pressure Weeks Of Treatment: 4 History: wounds, Paraplegia, Seizure Disorder Clustered Wound: No Photos Photo Uploaded By: Elpidio EricAfful, Rita on 04/18/2016 13:58:18 Wound Measurements Length: (cm) 1.5 Width: (cm) 0.5 Depth: (cm) 0.2 Area: (cm) 0.589 Volume: (cm) 0.118 % Reduction in Area: 37.5% % Reduction in Volume: -25.5% Epithelialization: Small (1-33%) Tunneling: No Wound Description Classification: Category/Stage II Wound Margin: Flat and Intact Exudate Amount: Medium Exudate Type: Serous Exudate Color: amber Wound Bed Granulation Amount: Medium (34-66%) Exposed Structure Granulation Quality: Pink, Friable Fascia Exposed: No Necrotic Amount: None Present (0%) Fat Layer Exposed: No Tendon Exposed: No [name] [name], [name] [name] ([id number]) Muscle Exposed: No Joint Exposed: No Bone Exposed: No Limited to Skin Breakdown Periwound Skin Texture Texture Color No Abnormalities Noted: No No Abnormalities Noted:  No Callus: No Atrophie Blanche: No Crepitus: No Cyanosis: No Excoriation: No Ecchymosis: No Fluctuance: No Erythema: No Friable: No Hemosiderin Staining: No Induration: No Mottled: No Localized Edema: No Pallor: No Rash: No Rubor: No Scarring: No Temperature / Pain Moisture Temperature: No Abnormality No Abnormalities Noted: No Dry / Scaly: No Maceration: No Moist: Yes Wound Preparation Ulcer Cleansing: Rinsed/Irrigated with Saline Topical Anesthetic Applied: None Treatment Notes Wound #2 (Right Groin) 1. Cleansed with: Clean wound with Normal Saline 4. Dressing Applied: Other dressing (specify in notes) Notes Drawtex only Electronic Signature(s) Signed: 04/18/2016 2:51:11 PM By: Elpidio EricAfful, Rita BSN, RN Entered By: Elpidio EricAfful, Rita on 04/18/2016 10:42:03 [name] HelperIRBY, [name] ([id number]) -------------------------------------------------------------------------------- Wound Assessment Details Patient Name: [name] HelperIRBY, [name] [name] Date of Service: 04/18/2016 10:00 AM Medical Record Number: [id number] Patient Account Number: [id number] Date of Birth/Sex: 10/21/1985 (30 y.o. Male) Treating RN: Clover MealyAfful, RN, BSN, Rita Primary Care Physician: Bethann PunchesMiller, Mark Other Clinician: Referring Physician: Bethann PunchesMiller, Mark Treating Physician/Extender: Rudene ReBritto, Errol Weeks in Treatment: 4 Wound Status Wound Number: 3 Primary Pressure Ulcer Etiology: Wound Location: Left Groin Wound Healed - Epithelialized Wounding Event: Gradually Appeared Status: Date Acquired: 03/11/2016 Comorbid Hepatitis C, History of pressure Weeks Of Treatment: 4 History: wounds, Paraplegia, Seizure Disorder Clustered Wound: No Photos Photo Uploaded By: Elpidio EricAfful, Rita on 04/18/2016 13:58:18 Wound Measurements Length: (cm) 0 % Reduction Width: (cm) 0 % Reduction Depth: (cm) 0 Epithelializ Area: (cm) 0 Tunneling: Volume: (cm) 0 Undermining in Area: 100% in Volume: 100% ation: Large (67-100%) No : No Wound  Description Classification: Category/Stage I Wound Margin: Indistinct, nonvisible Exudate Amount: None Present Wound Bed Granulation Amount: None Present (0%) Exposed Structure Necrotic Amount: None Present (0%) Fascia Exposed: No Fat Layer Exposed: No Tendon Exposed: No Muscle Exposed: No Joint Exposed: No [name], [name] ([id number]) Bone Exposed: No Limited to Skin  Breakdown Periwound Skin Texture Texture Color No Abnormalities Noted: No No Abnormalities Noted: No Callus: No Atrophie Blanche: No Crepitus: No Cyanosis: No Excoriation: No Ecchymosis: No Fluctuance: No Erythema: No Friable: No Hemosiderin Staining: No Induration: No Mottled: No Localized Edema: No Pallor: No Rash: No Rubor: No Scarring: No Temperature / Pain Moisture Temperature: No Abnormality No Abnormalities Noted: No Dry / Scaly: Yes Maceration: No Moist: No Wound Preparation Ulcer Cleansing: Rinsed/Irrigated with Saline Topical Anesthetic Applied: None Electronic Signature(s) Signed: 04/18/2016 2:51:11 PM By: Elpidio Eric BSN, RN Entered By: Elpidio Eric on 04/18/2016 10:42:24 [name] [name] ([id number]) -------------------------------------------------------------------------------- Vitals Details Patient Name: [name] [name] Date of Service: 04/18/2016 10:00 AM Medical Record Number: [id number] Patient Account Number: [id number] Date of Birth/Sex: April 22, 1986 (30 y.o. Male) Treating RN: Clover Mealy, RN, BSN, Rita Primary Care Physician: Bethann Punches Other Clinician: Referring Physician: Bethann Punches Treating Physician/Extender: Rudene Re in Treatment: 4 Vital Signs Time Taken: 10:20 Temperature (F): 98.3 Weight (lbs): 220 Pulse (bpm): 75 Respiratory Rate (breaths/min): 18 Blood Pressure (mmHg): 118/62 Reference Range: 80 - 120 mg / dl Electronic Signature(s) Signed: 04/18/2016 2:51:11 PM By: Elpidio Eric BSN, RN Entered By: Elpidio Eric on 04/18/2016 10:20:41

## 2016-04-18 NOTE — Progress Notes (Signed)
AYOMIDE, PURDY ([id number]) Visit Report for 04/18/2016 Chief Complaint Document Details Patient Name: [name] [name], [name] [name] Date of Service: 04/18/2016 10:00 AM Medical Record Number: [id number] Patient Account Number: [id number] Date of Birth/Sex: 12-30-1985 (30 y.o. Male) Treating RN: Clover Mealy, RN, BSN, [place] Sink Primary Care Physician: Bethann Punches Other Clinician: Referring Physician: Bethann Punches Treating Physician/Extender: Rudene Re in Treatment: 4 Information Obtained from: Caregiver Chief Complaint Patient is at the clinic for treatment of an open wounds to the right more than left groin for about a month Electronic Signature(s) Signed: 04/18/2016 11:13:08 AM By: Evlyn Kanner MD, FACS Entered By: Evlyn Kanner on 04/18/2016 11:13:08 [name] ([id number]) -------------------------------------------------------------------------------- HPI Details Patient Name: [name] Date of Service: 04/18/2016 10:00 AM Medical Record Number: [id number] Patient Account Number: [id number] Date of Birth/Sex: 1986-06-12 (30 y.o. Male) Treating RN: Clover Mealy, RN, BSN, [place] Sink Primary Care Physician: Bethann Punches Other Clinician: Referring Physician: Bethann Punches Treating Physician/Extender: Rudene Re in Treatment: 4 History of Present Illness Location: right more than left groin Quality: Patient reports No Pain. Severity: Patient states wound are getting worse. Duration: Patient has had the wound for > 1 months prior to seeking treatment at the wound center Context: The wound appeared gradually over time HPI Description: 30 year old male who has a history of traumatic brain injury and also has a ulcer on his left heel which was noted by the family about 4-5 months ago. The TBI was in January 2013 and prior to this visit the patient has had hyperbaric oxygen therapy approximately 2 years after his TBI. He is had several gastrostomy tube placed by the gastroenterologist but then finally had a  surgical procedure to bring the gastric mucosa to the skin surface. This was done approximately 9 months ago. Past medical history of hepatitis C, traumatic brain injury, pulmonary emboli, ileostomy, tracheostomy, PEG tube placement. Was a former smoker and quit in January 2013 He known to see [place] for various issues with wounds and this time around for about a month he has open areas near his groin possibly due to the contracture. No history of fungal infection of the groin. 04/04/2016 -- his caregiver says there is a lot of drainage and she has to often change it more than once a day because of the amount of drainage in this area. Electronic Signature(s) Signed: 04/18/2016 11:13:16 AM By: Evlyn Kanner MD, FACS Entered By: Evlyn Kanner on 04/18/2016 11:13:16 [name] ([id number]) -------------------------------------------------------------------------------- Physical Exam Details Patient Name: [name] Date of Service: 04/18/2016 10:00 AM Medical Record Number: [id number] Patient Account Number: [id number] Date of Birth/Sex: 01/29/1986 (30 y.o. Male) Treating RN: Clover Mealy, RN, BSN, [place] Sink Primary Care Physician: Bethann Punches Other Clinician: Referring Physician: Bethann Punches Treating Physician/Extender: Rudene Re in Treatment: 4 Constitutional . Pulse regular. Respirations normal and unlabored. Afebrile. . Eyes Nonicteric. Reactive to light. Ears, Nose, Mouth, and Throat Lips, teeth, and gums WNL.Marland Kitchen Moist mucosa without lesions. Neck supple and nontender. No palpable supraclavicular or cervical adenopathy. Normal sized without goiter. Respiratory WNL. No retractions.. Cardiovascular Pedal Pulses WNL. No clubbing, cyanosis or edema. Lymphatic No adneopathy. No adenopathy. No adenopathy. Musculoskeletal Adexa without tenderness or enlargement.. Digits and nails w/o clubbing, cyanosis, infection, petechiae, ischemia, or inflammatory conditions.. Integumentary (Hair,  Skin) No suspicious lesions. No crepitus or fluctuance. No peri-wound warmth or erythema. No masses.Marland Kitchen Psychiatric Judgement and insight Intact.. No evidence of depression, anxiety, or agitation.. Notes the left groin fissure has completely healed. On the right side there is still an open wound but the  areas remaining much drier with the drawtex Electronic Signature(s) Signed: 04/18/2016 11:14:07 AM By: Evlyn Kanner MD, FACS Entered By: Evlyn Kanner on 04/18/2016 11:14:06 [name] ([id number]) -------------------------------------------------------------------------------- Physician Orders Details Patient Name: [name] Date of Service: 04/18/2016 10:00 AM Medical Record Number: [id number] Patient Account Number: [id number] Date of Birth/Sex: 12-31-1985 (30 y.o. Male) Treating RN: Clover Mealy, RN, BSN, [place] Sink Primary Care Physician: Bethann Punches Other Clinician: Referring Physician: Bethann Punches Treating Physician/Extender: Rudene Re in Treatment: 4 Verbal / Phone Orders: Yes Clinician: Afful, RN, BSN, Rita Read Back and Verified: Yes Diagnosis Coding Wound Cleansing Wound #2 Right Groin o Clean wound with Normal Saline. Anesthetic Wound #2 Right Groin o Topical Lidocaine 4% cream applied to wound bed prior to debridement Primary Wound Dressing Wound #2 Right Groin o Drawtex Dressing Change Frequency Wound #2 Right Groin o Change dressing every other day. Follow-up Appointments Wound #2 Right Groin o Return Appointment in 2 weeks. Electronic Signature(s) Signed: 04/18/2016 11:43:27 AM By: Evlyn Kanner MD, FACS Signed: 04/18/2016 2:51:11 PM By: Elpidio Eric BSN, RN Entered By: Elpidio Eric on 04/18/2016 10:43:04 [name] ([id number]) -------------------------------------------------------------------------------- Problem List Details Patient Name: [name] Date of Service: 04/18/2016 10:00 AM Medical Record Number: [id number] Patient Account  Number: [id number] Date of Birth/Sex: [birth year] (30 y.o. Male) Treating RN: Clover Mealy, RN, BSN, [place] Sink Primary Care Physician: Bethann Punches Other Clinician: Referring Physician: Bethann Punches Treating Physician/Extender: Rudene Re in Treatment: 4 Active Problems ICD-10 Encounter Code Description Active Date Diagnosis Z87.820 Personal history of traumatic brain injury 03/21/2016 Yes S31.501A Unspecified open wound of unspecified external genital 03/21/2016 Yes organs, male, initial encounter Inactive Problems Resolved Problems Electronic Signature(s) Signed: 04/18/2016 11:12:58 AM By: Evlyn Kanner MD, FACS Entered By: Evlyn Kanner on 04/18/2016 11:12:58 [name] ([id number]) -------------------------------------------------------------------------------- Progress Note Details Patient Name: [name] Date of Service: 04/18/2016 10:00 AM Medical Record Number: [id number] Patient Account Number: [id number] Date of Birth/Sex: Apr 25, 1986 (30 y.o. Male) Treating RN: Clover Mealy, RN, BSN, [place] Sink Primary Care Physician: Bethann Punches Other Clinician: Referring Physician: Bethann Punches Treating Physician/Extender: Rudene Re in Treatment: 4 Subjective Chief Complaint Information obtained from Caregiver Patient is at the clinic for treatment of an open wounds to the right more than left groin for about a month History of Present Illness (HPI) The following HPI elements were documented for the patient's wound: Location: right more than left groin Quality: Patient reports No Pain. Severity: Patient states wound are getting worse. Duration: Patient has had the wound for > 1 months prior to seeking treatment at the wound center Context: The wound appeared gradually over time 30 year old male who has a history of traumatic brain injury and also has a ulcer on his left heel which was noted by the family about 4-5 months ago. The TBI was in January 2013 and prior to this visit the  patient has had hyperbaric oxygen therapy approximately 2 years after his TBI. He is had several gastrostomy tube placed by the gastroenterologist but then finally had a surgical procedure to bring the gastric mucosa to the skin surface. This was done approximately 9 months ago. Past medical history of hepatitis C, traumatic brain injury, pulmonary emboli, ileostomy, tracheostomy, PEG tube placement. Was a former smoker and quit in January 2013 He known to see [place] for various issues with wounds and this time around for about a month he has open areas near his groin possibly due to the contracture. No history of fungal infection of the groin. 04/04/2016 -- his caregiver says  there is a lot of drainage and she has to often change it more than once a day because of the amount of drainage in this area. Objective Constitutional Pulse regular. Respirations normal and unlabored. Afebrile. Vitals Time Taken: 10:20 AM, Weight: 220 lbs, Temperature: 98.3 F, Pulse: 75 bpm, Respiratory Rate: 18 breaths/min, Blood Pressure: 118/62 mmHg. [name], [place] ([id number]) Eyes Nonicteric. Reactive to light. Ears, Nose, Mouth, and Throat Lips, teeth, and gums WNL.Marland Kitchen. Moist mucosa without lesions. Neck supple and nontender. No palpable supraclavicular or cervical adenopathy. Normal sized without goiter. Respiratory WNL. No retractions.. Cardiovascular Pedal Pulses WNL. No clubbing, cyanosis or edema. Lymphatic No adneopathy. No adenopathy. No adenopathy. Musculoskeletal Adexa without tenderness or enlargement.. Digits and nails w/o clubbing, cyanosis, infection, petechiae, ischemia, or inflammatory conditions.Marland Kitchen. Psychiatric Judgement and insight Intact.. No evidence of depression, anxiety, or agitation.. General Notes: the left groin fissure has completely healed. On the right side there is still an open wound but the areas remaining much drier with the drawtex Integumentary (Hair, Skin) No suspicious  lesions. No crepitus or fluctuance. No peri-wound warmth or erythema. No masses.. Wound #2 status is Open. Original cause of wound was Gradually Appeared. The wound is located on the Right Groin. The wound measures 1.5cm length x 0.5cm width x 0.2cm depth; 0.589cm^2 area and 0.118cm^3 volume. The wound is limited to skin breakdown. There is no tunneling noted. There is a medium amount of serous drainage noted. The wound margin is flat and intact. There is medium (34-66%) pink, friable granulation within the wound bed. There is no necrotic tissue within the wound bed. The periwound skin appearance exhibited: Moist. The periwound skin appearance did not exhibit: Callus, Crepitus, Excoriation, Fluctuance, Friable, Induration, Localized Edema, Rash, Scarring, Dry/Scaly, Maceration, Atrophie Blanche, Cyanosis, Ecchymosis, Hemosiderin Staining, Mottled, Pallor, Rubor, Erythema. Periwound temperature was noted as No Abnormality. Wound #3 status is Healed - Epithelialized. Original cause of wound was Gradually Appeared. The wound is located on the Left Groin. The wound measures 0cm length x 0cm width x 0cm depth; 0cm^2 area and 0cm^3 volume. The wound is limited to skin breakdown. There is no tunneling or undermining noted. There is a none present amount of drainage noted. The wound margin is indistinct and nonvisible. There is no granulation within the wound bed. There is no necrotic tissue within the wound bed. The periwound skin appearance exhibited: Dry/Scaly. The periwound skin appearance did not exhibit: Callus, Crepitus, Excoriation, Fluctuance, Friable, Induration, Localized Edema, Rash, Scarring, Maceration, Moist, Atrophie Theurer, Mujahid ([id number]) Blanche, Cyanosis, Ecchymosis, Hemosiderin Staining, Mottled, Pallor, Rubor, Erythema. Periwound temperature was noted as No Abnormality. Assessment Active Problems ICD-10 Z87.820 - Personal history of traumatic brain injury S31.501A -  Unspecified open wound of unspecified external genital organs, male, initial encounter Plan Wound Cleansing: Wound #2 Right Groin: Clean wound with Normal Saline. Anesthetic: Wound #2 Right Groin: Topical Lidocaine 4% cream applied to wound bed prior to debridement Primary Wound Dressing: Wound #2 Right Groin: Drawtex Dressing Change Frequency: Wound #2 Right Groin: Change dressing every other day. Follow-up Appointments: Wound #2 Right Groin: Return Appointment in 2 weeks. I have recommended using some Drawtex in the creases of the groin and keeping this in place without any tape. This should be changed as often as needed but may be left on for a day or 2 at a time if not soiled. Offloading, diet and other precautions have been discussed with the caregivers. They will come back to visit us once in every 2-3 weeks Mckiernan, Arlen (  [id number]) Electronic Signature(s) Signed: 04/18/2016 11:15:06 AM By: Evlyn Kanner MD, FACS Entered By: Evlyn Kanner on 04/18/2016 11:15:05 [name] ([id number]) -------------------------------------------------------------------------------- SuperBill Details Patient Name: [name] Date of Service: 04/18/2016 Medical Record Number: [id number] Patient Account Number: [id number] Date of Birth/Sex: 1986/07/17 (30 y.o. Male) Treating RN: Clover Mealy, RN, BSN,  Sink Primary Care Physician: Bethann Punches Other Clinician: Referring Physician: Bethann Punches Treating Physician/Extender: Rudene Re in Treatment: 4 Diagnosis Coding ICD-10 Codes Code Description [phone] Personal history of traumatic brain injury S31.501A Unspecified open wound of unspecified external genital organs, male, initial encounter Facility Procedures CPT4 Code: [id number] Description: [phone] - WOUND CARE VISIT-LEV 2 EST PT Modifier: Quantity: 1 Physician Procedures CPT4: Description Modifier Quantity Code [id number] - WC PHYS LEVEL 3 - EST PT 1 ICD-10 Description Diagnosis  Z87.820 Personal history of traumatic brain injury S31.501A Unspecified open wound of unspecified external genital organs, male, initial  encounter Electronic Signature(s) Signed: 04/18/2016 11:15:19 AM By: Evlyn Kanner MD, FACS Entered By: Evlyn Kanner on 04/18/2016 11:15:19

## 2016-05-02 ENCOUNTER — Encounter: Payer: Self-pay | Admitting: General Surgery

## 2016-05-02 ENCOUNTER — Encounter: Payer: 59 | Attending: General Surgery | Admitting: General Surgery

## 2016-05-02 DIAGNOSIS — Z87891 Personal history of nicotine dependence: Secondary | ICD-10-CM | POA: Diagnosis not present

## 2016-05-02 DIAGNOSIS — G40909 Epilepsy, unspecified, not intractable, without status epilepticus: Secondary | ICD-10-CM | POA: Diagnosis not present

## 2016-05-02 DIAGNOSIS — Z79899 Other long term (current) drug therapy: Secondary | ICD-10-CM | POA: Insufficient documentation

## 2016-05-02 DIAGNOSIS — B1920 Unspecified viral hepatitis C without hepatic coma: Secondary | ICD-10-CM | POA: Diagnosis not present

## 2016-05-02 DIAGNOSIS — Z87820 Personal history of traumatic brain injury: Secondary | ICD-10-CM | POA: Diagnosis not present

## 2016-05-02 DIAGNOSIS — Z7901 Long term (current) use of anticoagulants: Secondary | ICD-10-CM | POA: Insufficient documentation

## 2016-05-02 DIAGNOSIS — I83221 Varicose veins of left lower extremity with both ulcer of thigh and inflammation: Secondary | ICD-10-CM | POA: Diagnosis not present

## 2016-05-02 DIAGNOSIS — S31501A Unspecified open wound of unspecified external genital organs, male, initial encounter: Secondary | ICD-10-CM | POA: Insufficient documentation

## 2016-05-02 DIAGNOSIS — L97129 Non-pressure chronic ulcer of left thigh with unspecified severity: Secondary | ICD-10-CM | POA: Insufficient documentation

## 2016-05-02 DIAGNOSIS — X58XXXA Exposure to other specified factors, initial encounter: Secondary | ICD-10-CM | POA: Diagnosis not present

## 2016-05-02 NOTE — Progress Notes (Addendum)
Ulcer with infected cyst perineum.  Treatinf with daily Drawtex

## 2016-05-03 NOTE — Progress Notes (Signed)
IMIR, BRUMBACH ([id number]) Visit Report for 05/02/2016 Arrival Information Details Patient Name: [name] [name], [name] [name] Date of Service: 05/02/2016 10:45 AM Medical Record Number: [id number] Patient Account Number: [id number] Date of Birth/Sex: August 25, 1985 (30 y.o. Male) Treating RN: Curtis Sites Primary Care Physician: Bethann Punches Other Clinician: Referring Physician: Bethann Punches Treating Physician/Extender: Rudene Re in Treatment: 6 Visit Information History Since Last Visit Added or deleted any medications: No Patient Arrived: Wheel Chair Any new allergies or adverse reactions: No Arrival Time: 10:53 Had a fall or experienced change in No activities of daily living that may affect Accompanied By: cg risk of falls: Transfer Assistance: Nurse, adult Signs or symptoms of abuse/neglect since last No Patient Identification Verified: Yes visito Secondary Verification Process Yes Hospitalized since last visit: No Completed: Pain Present Now: No Patient Requires Transmission-Based No Precautions: Patient Has Alerts: No Electronic Signature(s) Signed: 05/02/2016 4:26:40 PM By: Curtis Sites Entered By: Curtis Sites on 05/02/2016 10:54:49 [name] [name] ([id number]) -------------------------------------------------------------------------------- Clinic Level of Care Assessment Details Patient Name: [name] [name] Date of Service: 05/02/2016 10:45 AM Medical Record Number: [id number] Patient Account Number: [id number] Date of Birth/Sex: 1986-07-26 (30 y.o. Male) Treating RN: Curtis Sites Primary Care Physician: Bethann Punches Other Clinician: Referring Physician: Bethann Punches Treating Physician/Extender: Elayne Snare in Treatment: 6 Clinic Level of Care Assessment Items TOOL 4 Quantity Score []  - Use when only an EandM is performed on FOLLOW-UP visit 0 ASSESSMENTS - Nursing Assessment / Reassessment X - Reassessment of Co-morbidities (includes updates in patient status) 1  10 X - Reassessment of Adherence to Treatment Plan 1 5 ASSESSMENTS - Wound and Skin Assessment / Reassessment X - Simple Wound Assessment / Reassessment - one wound 1 5 []  - Complex Wound Assessment / Reassessment - multiple wounds 0 []  - Dermatologic / Skin Assessment (not related to wound area) 0 ASSESSMENTS - Focused Assessment []  - Circumferential Edema Measurements - multi extremities 0 []  - Nutritional Assessment / Counseling / Intervention 0 []  - Lower Extremity Assessment (monofilament, tuning fork, pulses) 0 []  - Peripheral Arterial Disease Assessment (using hand held doppler) 0 ASSESSMENTS - Ostomy and/or Continence Assessment and Care []  - Incontinence Assessment and Management 0 []  - Ostomy Care Assessment and Management (repouching, etc.) 0 PROCESS - Coordination of Care X - Simple Patient / Family Education for ongoing care 1 15 []  - Complex (extensive) Patient / Family Education for ongoing care 0 []  - Staff obtains Chiropractor, Records, Test Results / Process Orders 0 []  - Staff telephones HHA, Nursing Homes / Clarify orders / etc 0 []  - Routine Transfer to another Facility (non-emergent condition) 0 Quiles, Virgie ([id number]) []  - Routine Hospital Admission (non-emergent condition) 0 []  - New Admissions / Manufacturing engineer / Ordering NPWT, Apligraf, etc. 0 []  - Emergency Hospital Admission (emergent condition) 0 X - Simple Discharge Coordination 1 10 []  - Complex (extensive) Discharge Coordination 0 PROCESS - Special Needs []  - Pediatric / Minor Patient Management 0 []  - Isolation Patient Management 0 []  - Hearing / Language / Visual special needs 0 []  - Assessment of Community assistance (transportation, D/C planning, etc.) 0 []  - Additional assistance / Altered mentation 0 []  - Support Surface(s) Assessment (bed, cushion, seat, etc.) 0 INTERVENTIONS - Wound Cleansing / Measurement X - Simple Wound Cleansing - one wound 1 5 []  - Complex Wound Cleansing -  multiple wounds 0 X - Wound Imaging (photographs - any number of wounds) 1 5 []  - Wound Tracing (instead of photographs) 0 X - Simple Wound Measurement -  one wound 1 5 []  - Complex Wound Measurement - multiple wounds 0 INTERVENTIONS - Wound Dressings X - Small Wound Dressing one or multiple wounds 1 10 []  - Medium Wound Dressing one or multiple wounds 0 []  - Large Wound Dressing one or multiple wounds 0 []  - Application of Medications - topical 0 []  - Application of Medications - injection 0 INTERVENTIONS - Miscellaneous []  - External ear exam 0 Schadler, Oswald ([id number]) []  - Specimen Collection (cultures, biopsies, blood, body fluids, etc.) 0 []  - Specimen(s) / Culture(s) sent or taken to Lab for analysis 0 []  - Patient Transfer (multiple staff / Michiel Sites Lift / Similar devices) 0 []  - Simple Staple / Suture removal (25 or less) 0 []  - Complex Staple / Suture removal (26 or more) 0 []  - Hypo / Hyperglycemic Management (close monitor of Blood Glucose) 0 []  - Ankle / Brachial Index (ABI) - do not check if billed separately 0 X - Vital Signs 1 5 Has the patient been seen at the hospital within the last three years: Yes Total Score: 75 Level Of Care: New/Established - Level 2 Electronic Signature(s) Signed: 05/02/2016 4:26:40 PM By: Curtis Sites Entered By: Curtis Sites on 05/02/2016 11:23:02 [name] [name] ([id number]) -------------------------------------------------------------------------------- Encounter Discharge Information Details Patient Name: [name] [name] Date of Service: 05/02/2016 10:45 AM Medical Record Number: [id number] Patient Account Number: [id number] Date of Birth/Sex: 1985-12-11 (30 y.o. Male) Treating RN: Curtis Sites Primary Care Physician: Bethann Punches Other Clinician: Referring Physician: Bethann Punches Treating Physician/Extender: Elayne Snare in Treatment: 6 Encounter Discharge Information Items Discharge Pain Level: 0 Discharge Condition:  Stable Ambulatory Status: Wheelchair Discharge Destination: Home Transportation: Private Auto Accompanied By: Shelle Iron Schedule Follow-up Appointment: Yes Medication Reconciliation completed and provided to Patient/Care No Ryelan Kazee: Provided on Clinical Summary of Care: 05/02/2016 Form Type Recipient Paper Patient ZI Electronic Signature(s) Signed: 05/02/2016 1:31:34 PM By: Ardath Sax MD Previous Signature: 05/02/2016 12:03:21 PM Version By: Curtis Sites Previous Signature: 05/02/2016 11:34:20 AM Version By: Gwenlyn Perking Entered By: Ardath Sax on 05/02/2016 13:31:34 [name] [name] ([id number]) -------------------------------------------------------------------------------- Multi Wound Chart Details Patient Name: [name] [name] Date of Service: 05/02/2016 10:45 AM Medical Record Number: [id number] Patient Account Number: [id number] Date of Birth/Sex: Jan 24, 1986 (30 y.o. 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] [N/A:N/A] Comorbid History: [2:Hepatitis C, History of pressure wounds, Paraplegia, Seizure Disorder] [N/A:N/A] Date Acquired: [2:02/19/2016] [N/A:N/A] Weeks of Treatment: [2:6] [N/A:N/A] Wound Status: [2:Open] [N/A:N/A] Measurements L x W x D 1.1x0.3x0.1 [N/A:N/A] (cm) Area (cm) : [2:0.259] [N/A:N/A] Volume (cm) : [2:0.026] [N/A:N/A] % Reduction in Area: [2:72.50%] [N/A:N/A] % Reduction in Volume: 72.30% [N/A:N/A] Classification: [2:Category/Stage II] [N/A:N/A] Exudate Amount: [2:Medium]  [N/A:N/A] Exudate Type: [2:Serous] [N/A:N/A] Exudate Color: [2:amber] [N/A:N/A] Wound Margin: [2:Flat and Intact] [N/A:N/A] Granulation Amount: [2:Large (67-100%)] [N/A:N/A] Granulation Quality: [2:Pink, Friable] [N/A:N/A] Necrotic Amount: [2:None Present (0%)] [N/A:N/A] Exposed Structures: [2:Fascia: No Fat: No Tendon: No Muscle: No Joint: No] [N/A:N/A] Bone: No Limited to Skin Breakdown Epithelialization: Medium (34-66%) N/A N/A Periwound Skin Texture: Edema: No N/A N/A Excoriation: No Induration: No Callus: No Crepitus: No Fluctuance: No Friable: No Rash: No Scarring: No Periwound Skin Moist: Yes N/A N/A Moisture: Maceration: No Dry/Scaly: No Periwound Skin Color:  Atrophie Blanche: No N/A N/A Cyanosis: No Ecchymosis: No Erythema: No Hemosiderin Staining: No Mottled: No Pallor: No Rubor: No Temperature: No Abnormality N/A N/A Tenderness on No N/A N/A Palpation: Wound Preparation: Ulcer Cleansing: N/A N/A Rinsed/Irrigated with Saline Topical Anesthetic Applied: None Treatment Notes Electronic Signature(s) Signed: 05/02/2016 4:26:40 PM By: Curtis Sites Entered By: Curtis Sites on 05/02/2016 11:21:20 [name] [name] ([id number]) -------------------------------------------------------------------------------- Multi-Disciplinary Care Plan Details Patient Name: [name] [name] Date of Service: 05/02/2016 10:45 AM Medical Record Number: [id number] Patient Account Number: [id number] Date of Birth/Sex: [birth year] (30 y.o. Male) Treating RN: Curtis Sites Primary Care Physician: Bethann Punches Other Clinician: Referring Physician: Bethann Punches Treating Physician/Extender: Elayne Snare in Treatment: 6 Active Inactive Orientation to the Wound Care Program Nursing Diagnoses: Knowledge deficit related to the wound healing center program Goals: Patient/caregiver will verbalize understanding of the Wound Healing Center Program Date Initiated: 03/21/2016 Goal  Status: Active Interventions: Provide education on orientation to the wound center Notes: Pressure Nursing Diagnoses: Knowledge deficit related to management of pressures ulcers Goals: Patient will remain free of pressure ulcers Date Initiated: 03/21/2016 Goal Status: Active Interventions: Assess: immobility, friction, shearing, incontinence upon admission and as needed Notes: Wound/Skin Impairment Nursing Diagnoses: Impaired tissue integrity Goals: Ulcer/skin breakdown will have a volume reduction of 30% by week 4 Date Initiated: 03/21/2016 [name] [name] ([id number]) Goal Status: Active Interventions: Assess patient/caregiver ability to perform ulcer/skin care regimen upon admission and as needed Notes: Electronic Signature(s) Signed: 05/02/2016 4:26:40 PM By: Curtis Sites Entered By: Curtis Sites on 05/02/2016 11:21:09 [name] [name] ([id number]) -------------------------------------------------------------------------------- Pain Assessment Details Patient Name: [name] [name] Date of Service: 05/02/2016 10:45 AM Medical Record Number: [id number] Patient Account Number: [id number] Date of Birth/Sex: [birth year] (30 y.o. Male) Treating RN: Curtis Sites Primary Care Physician: Bethann Punches Other Clinician: Referring Physician: Bethann Punches Treating Physician/Extender: Rudene Re in Treatment: 6 Active Problems Location of Pain Severity and Description of Pain Patient Has Paino No Site Locations Pain Management and Medication Current Pain Management: Notes Topical or injectable lidocaine is offered to patient for acute pain when surgical debridement is performed. If needed, Patient is instructed to use over the counter pain medication for the following 24-48 hours after debridement. Wound care MDs do not prescribed pain medications. Patient has chronic pain or uncontrolled pain. Patient has been instructed to make an appointment with their Primary Care Physician  for pain management. Electronic Signature(s) Signed: 05/02/2016 4:26:40 PM By: Curtis Sites Entered By: Curtis Sites on 05/02/2016 10:55:07 [name] [name] ([id number]) -------------------------------------------------------------------------------- Patient/Caregiver Education Details Patient Name: [name] [name] Date of Service: 05/02/2016 10:45 AM Medical Record Number: [id number] Patient Account Number: [id number] Date of Birth/Gender: [birth year] (30 y.o. Male) Treating RN: Curtis Sites Primary Care Physician: Bethann Punches Other Clinician: Referring Physician: Bethann Punches Treating Physician/Extender: Elayne Snare in Treatment: 6 Education Assessment Education Provided To: Caregiver Education Topics Provided Basic Hygiene: Handouts: Other: ways to prevent moisture build up in skin folds Methods: Explain/Verbal Responses: State content correctly Wound/Skin Impairment: Handouts: Other: wound care as ordered Methods: Demonstration, Explain/Verbal Responses: State content correctly Electronic Signature(s) Signed: 05/02/2016 3:59:06 PM By: Ardath Sax MD Entered By: Ardath Sax on 05/02/2016 13:31:44 [name] [name] ([id number]) -------------------------------------------------------------------------------- Wound Assessment Details Patient Name: [name] [name] Date of Service: 05/02/2016 10:45 AM Medical Record Number: [id number] Patient Account Number: [id number] Date of Birth/Sex: [birth year] (30 y.o. Male) Treating RN: Curtis Sites Primary Care Physician: Bethann Punches Other Clinician: Referring Physician: Bethann Punches Treating Physician/Extender: Rudene Re in Treatment: 6 Wound Status Wound Number: 2 Primary  Pressure Ulcer Etiology: Wound Location: Right Groin Wound Open Wounding Event: Gradually Appeared Status: Date Acquired: 02/19/2016 Comorbid Hepatitis C, History of pressure Weeks Of Treatment: 6 History: wounds, Paraplegia, Seizure  Disorder Clustered Wound: No Photos Wound Measurements Length: (cm) 1.1 Width: (cm) 0.3 Depth: (cm) 0.1 Area: (cm) 0.259 Volume: (cm) 0.026 % Reduction in Area: 72.5% % Reduction in Volume: 72.3% Epithelialization: Medium (34-66%) Tunneling: No Undermining: No Wound Description Classification: Category/Stage II Wound Margin: Flat and Intact Exudate Amount: Medium Exudate Type: Serous Exudate Color: amber Wound Bed Granulation Amount: Large (67-100%) Exposed Structure Granulation Quality: Pink, Friable Fascia Exposed: No Necrotic Amount: None Present (0%) Fat Layer Exposed: No Tendon Exposed: No Muscle Exposed: No Cisar, Earna CoderZACHARY ([id number]) Joint Exposed: No Bone Exposed: No Limited to Skin Breakdown Periwound Skin Texture Texture Color No Abnormalities Noted: No No Abnormalities Noted: No Callus: No Atrophie Blanche: No Crepitus: No Cyanosis: No Excoriation: No Ecchymosis: No Fluctuance: No Erythema: No Friable: No Hemosiderin Staining: No Induration: No Mottled: No Localized Edema: No Pallor: No Rash: No Rubor: No Scarring: No Temperature / Pain Moisture Temperature: No Abnormality No Abnormalities Noted: No Dry / Scaly: No Maceration: No Moist: Yes Wound Preparation Ulcer Cleansing: Rinsed/Irrigated with Saline Topical Anesthetic Applied: None Treatment Notes Wound #2 (Right Groin) 1. Cleansed with: Clean wound with Normal Saline 4. Dressing Applied: Aquacel Ag Other dressing (specify in notes) Notes Drawtex over aquacel ag Electronic Signature(s) Signed: 05/02/2016 4:26:40 PM By: Curtis Sitesorthy, Joanna Entered By: Curtis Sitesorthy, Joanna on 05/02/2016 15:08:52 [name] HelperIRBY, Mackay ([id number]) -------------------------------------------------------------------------------- Vitals Details Patient Name: [name] HelperIRBY, Nuh Date of Service: 05/02/2016 10:45 AM Medical Record Number: [id number] Patient Account Number: [id number] Date of Birth/Sex: 04/26/1986 (30 y.o.  Male) Treating RN: Curtis Sitesorthy, Joanna Primary Care Physician: Bethann PunchesMiller, Mark Other Clinician: Referring Physician: Bethann PunchesMiller, Mark Treating Physician/Extender: Rudene ReBritto, Errol Weeks in Treatment: 6 Vital Signs Time Taken: 10:55 Temperature (F): 98.4 Weight (lbs): 220 Pulse (bpm): 66 Respiratory Rate (breaths/min): 18 Blood Pressure (mmHg): 115/69 Reference Range: 80 - 120 mg / dl Electronic Signature(s) Signed: 05/02/2016 4:26:40 PM By: Curtis Sitesorthy, Joanna Entered By: Curtis Sitesorthy, Joanna on 05/02/2016 10:56:16

## 2016-05-03 NOTE — Progress Notes (Signed)
[name] [name], Eben ([id number]) Visit Report for 05/02/2016 Physician Orders Details Patient Name: [name] [name], [name] [name] Date of Service: 05/02/2016 10:45 AM Medical Record Number: [id number] Patient Account Number: [id number] Date of Birth/Sex: 11/18/1985 (30 y.o. Male) Treating RN: Curtis Sitesorthy, Joanna Primary Care Physician: Bethann PunchesMiller, Mark Other Clinician: Referring Physician: Bethann PunchesMiller, Mark Treating Physician/Extender: Elayne SnarePARKER, Hodan Wurtz Weeks in Treatment: 6 Verbal / Phone Orders: Yes Clinician: Curtis Sitesorthy, Joanna Read Back and Verified: Yes Diagnosis Coding Wound Cleansing Wound #2 Right Groin o Clean wound with Normal Saline. Anesthetic Wound #2 Right Groin o Topical Lidocaine 4% cream applied to wound bed prior to debridement Primary Wound Dressing Wound #2 Right Groin o Aquacel Ag - over wound o Drawtex - over aquacel ag for moisture Dressing Change Frequency Wound #2 Right Groin o Change dressing every day. Follow-up Appointments Wound #2 Right Groin o Return Appointment in 2 weeks. Electronic Signature(s) Signed: 05/02/2016 3:59:06 PM By: Ardath SaxParker, Tyisha Cressy MD Signed: 05/02/2016 4:26:40 PM By: Curtis Sitesorthy, Joanna Entered By: Curtis Sitesorthy, Joanna on 05/02/2016 11:22:35 [name] [name], Maico ([id number]) -------------------------------------------------------------------------------- Problem List Details Patient Name: [name] [name], [name] [name] Date of Service: 05/02/2016 10:45 AM Medical Record Number: [id number] Patient Account Number: [id number] Date of Birth/Sex: 05/15/1986 (30 y.o. Male) Treating RN: Curtis Sitesorthy, Joanna Primary Care Physician: Bethann PunchesMiller, Mark Other Clinician: Referring Physician: Bethann PunchesMiller, Mark Treating Physician/Extender: Elayne SnarePARKER, Egbert Seidel Weeks in Treatment: 6 Active Problems ICD-10 Encounter Code Description Active Date Diagnosis Z87.820 Personal history of traumatic brain injury 03/21/2016 Yes S31.501A Unspecified open wound of unspecified external genital 03/21/2016 Yes organs, male, initial  encounter Inactive Problems Resolved Problems Electronic Signature(s) Signed: 05/02/2016 1:27:58 PM By: Ardath SaxParker, Kamarian Sahakian MD Entered By: Ardath SaxParker, Janthony Holleman on 05/02/2016 13:27:58 [name] [name], Rohan ([id number]) -------------------------------------------------------------------------------- Progress Note Details Patient Name: [name] [name], [name] [name] Date of Service: 05/02/2016 10:45 AM Medical Record Number: [id number] Patient Account Number: [id number] Date of Birth/Sex: 12/10/1985 (30 y.o. Male) Treating RN: Curtis Sitesorthy, Joanna Primary Care Physician: Bethann PunchesMiller, Mark Other Clinician: Referring Physician: Bethann PunchesMiller, Mark Treating Physician/Extender: Ardath SaxPARKER, Russel Morain Weeks in Treatment: 6 Objective Constitutional Vitals Time Taken: 10:55 AM, Weight: 220 lbs, Temperature: 98.4 F, Pulse: 66 bpm, Respiratory Rate: 18 breaths/min, Blood Pressure: 115/69 mmHg. Integumentary (Hair, Skin) Wound #2 status is Open. Original cause of wound was Gradually Appeared. The wound is located on the Right Groin. The wound measures 1.1cm length x 0.3cm width x 0.1cm depth; 0.259cm^2 area and 0.026cm^3 volume. The wound is limited to skin breakdown. There is no tunneling or undermining noted. There is a medium amount of serous drainage noted. The wound margin is flat and intact. There is large (67-100%) pink, friable granulation within the wound bed. There is no necrotic tissue within the wound bed. The periwound skin appearance exhibited: Moist. The periwound skin appearance did not exhibit: Callus, Crepitus, Excoriation, Fluctuance, Friable, Induration, Localized Edema, Rash, Scarring, Dry/Scaly, Maceration, Atrophie Blanche, Cyanosis, Ecchymosis, Hemosiderin Staining, Mottled, Pallor, Rubor, Erythema. Periwound temperature was noted as No Abnormality. Assessment Active Problems ICD-10 Z87.820 - Personal history of traumatic brain injury S31.501A - Unspecified open wound of unspecified external genital organs, male, initial  encounter Plan Wound Cleansing: Wound #2 Right Groin: Clean wound with Normal Saline. AnestheticFayrene [name]: [facility], Ranbir ([id number]) Wound #2 Right Groin: Topical Lidocaine 4% cream applied to wound bed prior to debridement Primary Wound Dressing: Wound #2 Right Groin: Aquacel Ag - over wound Drawtex - over aquacel ag for moisture Dressing Change Frequency: Wound #2 Right Groin: Change dressing every day. Follow-up Appointments: Wound #2 Right Groin: Return Appointment in 2 weeks. Follow-Up Appointments: A follow-up appointment should be scheduled. 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 Continue Drawtex daily Electronic Signature(s) Signed: 05/02/2016 1:30:34 PM By: Ardath Sax MD Entered By: Ardath Sax on 05/02/2016 13:30:34 [name] [name] ([id number]) -------------------------------------------------------------------------------- SuperBill Details Patient Name: [name] [name] Date of Service: 05/02/2016 Medical Record Number: [id number] Patient Account Number: [id number] Date of Birth/Sex: November 07, 1985 (30 y.o. Male) Treating RN: Curtis Sites Primary Care Physician: Bethann Punches Other Clinician: Referring Physician: Bethann Punches Treating Physician/Extender: Elayne Snare in Treatment: 6 Diagnosis Coding ICD-10 Codes Code Description [phone] Personal history of traumatic brain injury S31.501A Unspecified open wound of unspecified external genital organs, male, initial encounter Facility Procedures CPT4 Code: [id number] Description: [phone] - WOUND CARE VISIT-LEV 2 EST PT Modifier: Quantity: 1 Physician Procedures CPT4: Description Modifier Quantity Code [id number] - WC PHYS LEVEL 2 - EST PT 1 ICD-10 Description Diagnosis S31.501A Unspecified open wound of unspecified external genital organs, male, initial encounter Electronic Signature(s) Signed: 05/02/2016 1:31:09 PM By: Ardath Sax MD Entered By: Ardath Sax on  05/02/2016 13:31:09

## 2016-05-16 ENCOUNTER — Encounter: Payer: 59 | Admitting: Surgery

## 2016-05-16 DIAGNOSIS — S31501A Unspecified open wound of unspecified external genital organs, male, initial encounter: Secondary | ICD-10-CM | POA: Diagnosis not present

## 2016-05-17 NOTE — Progress Notes (Signed)
[name] HelperRBY, Arias ([id number]) Visit Report for 05/16/2016 Arrival Information Details Patient Name: [name] HelperRBY, Yaniv Date of Service: 05/16/2016 10:00 AM Medical Record Number: [id number] Patient Account Number: [id number] Date of Birth/Sex: 09/29/1985 (30 y.o. Male) Treating RN: Curtis Sitesorthy, Joanna Primary Care Physician: Bethann PunchesMiller, Mark Other Clinician: Referring Physician: Bethann PunchesMiller, Mark Treating Physician/Extender: Rudene ReBritto, Errol Weeks in Treatment: 8 Visit Information History Since Last Visit Added or deleted any medications: No Patient Arrived: Wheel Chair Any new allergies or adverse reactions: No Arrival Time: 10:03 Had a fall or experienced change in No activities of daily living that may affect Accompanied By: cg risk of falls: Transfer Assistance: Hoyer Lift Signs or symptoms of abuse/neglect since last No Patient Identification Verified: Yes visito Secondary Verification Process Yes Hospitalized since last visit: No Completed: Pain Present Now: Yes Patient Requires Transmission-Based No Precautions: Patient Has Alerts: No Electronic Signature(s) Signed: 05/16/2016 5:26:42 PM By: Curtis Sitesorthy, Joanna Entered By: Curtis Sitesorthy, Joanna on 05/16/2016 10:13:41 [name] HelperIRBY, Tremaine ([id number]) -------------------------------------------------------------------------------- Clinic Level of Care Assessment Details Patient Name: [name] HelperIRBY, Leonardo Date of Service: 05/16/2016 10:00 AM Medical Record Number: [id number] Patient Account Number: [id number] Date of Birth/Sex: 05/03/1986 (30 y.o. Male) Treating RN: Clover MealyAfful, RN, BSN, Rita Primary Care Physician: Bethann PunchesMiller, Mark Other Clinician: Referring Physician: Bethann PunchesMiller, Mark Treating Physician/Extender: Rudene ReBritto, Errol Weeks in Treatment: 8 Clinic Level of Care Assessment Items TOOL 4 Quantity Score []  - Use when only an EandM is performed on FOLLOW-UP visit 0 ASSESSMENTS - Nursing Assessment / Reassessment X - Reassessment of Co-morbidities (includes updates in patient  status) 1 10 X - Reassessment of Adherence to Treatment Plan 1 5 ASSESSMENTS - Wound and Skin Assessment / Reassessment X - Simple Wound Assessment / Reassessment - one wound 1 5 []  - Complex Wound Assessment / Reassessment - multiple wounds 0 []  - Dermatologic / Skin Assessment (not related to wound area) 0 ASSESSMENTS - Focused Assessment []  - Circumferential Edema Measurements - multi extremities 0 []  - Nutritional Assessment / Counseling / Intervention 0 []  - Lower Extremity Assessment (monofilament, tuning fork, pulses) 0 []  - Peripheral Arterial Disease Assessment (using hand held doppler) 0 ASSESSMENTS - Ostomy and/or Continence Assessment and Care []  - Incontinence Assessment and Management 0 []  - Ostomy Care Assessment and Management (repouching, etc.) 0 PROCESS - Coordination of Care X - Simple Patient / Family Education for ongoing care 1 15 []  - Complex (extensive) Patient / Family Education for ongoing care 0 []  - Staff obtains ChiropractorConsents, Records, Test Results / Process Orders 0 []  - Staff telephones HHA, Nursing Homes / Clarify orders / etc 0 []  - Routine Transfer to another Facility (non-emergent condition) 0 Haseley, Davien ([id number]) []  - Routine Hospital Admission (non-emergent condition) 0 []  - New Admissions / Manufacturing engineernsurance Authorizations / Ordering NPWT, Apligraf, etc. 0 []  - Emergency Hospital Admission (emergent condition) 0 []  - Simple Discharge Coordination 0 []  - Complex (extensive) Discharge Coordination 0 PROCESS - Special Needs []  - Pediatric / Minor Patient Management 0 []  - Isolation Patient Management 0 []  - Hearing / Language / Visual special needs 0 []  - Assessment of Community assistance (transportation, D/C planning, etc.) 0 []  - Additional assistance / Altered mentation 0 []  - Support Surface(s) Assessment (bed, cushion, seat, etc.) 0 INTERVENTIONS - Wound Cleansing / Measurement X - Simple Wound Cleansing - one wound 1 5 []  - Complex Wound Cleansing  - multiple wounds 0 X - Wound Imaging (photographs - any number of wounds) 1 5 []  - Wound Tracing (instead of photographs) 0 X - Simple Wound  Measurement - one wound 1 5 []  - Complex Wound Measurement - multiple wounds 0 INTERVENTIONS - Wound Dressings X - Small Wound Dressing one or multiple wounds 1 10 []  - Medium Wound Dressing one or multiple wounds 0 []  - Large Wound Dressing one or multiple wounds 0 []  - Application of Medications - topical 0 []  - Application of Medications - injection 0 INTERVENTIONS - Miscellaneous []  - External ear exam 0 Vanegas, Clovis ([id number]) []  - Specimen Collection (cultures, biopsies, blood, body fluids, etc.) 0 []  - Specimen(s) / Culture(s) sent or taken to Lab for analysis 0 X - Patient Transfer (multiple staff / Michiel Sites Lift / Similar devices) 1 10 []  - Simple Staple / Suture removal (25 or less) 0 []  - Complex Staple / Suture removal (26 or more) 0 []  - Hypo / Hyperglycemic Management (close monitor of Blood Glucose) 0 []  - Ankle / Brachial Index (ABI) - do not check if billed separately 0 X - Vital Signs 1 5 Has the patient been seen at the hospital within the last three years: Yes Total Score: 75 Level Of Care: New/Established - Level 2 Electronic Signature(s) Signed: 05/16/2016 5:42:32 PM By: Elpidio Eric BSN, RN Entered By: Elpidio Eric on 05/16/2016 17:26:41 [name] [name] ([id number]) -------------------------------------------------------------------------------- Encounter Discharge Information Details Patient Name: [name] [name] Date of Service: 05/16/2016 10:00 AM Medical Record Number: [id number] Patient Account Number: [id number] Date of Birth/Sex: 1985/08/08 (30 y.o. Male) Treating RN: Phillis Haggis Primary Care Physician: Bethann Punches Other Clinician: Referring Physician: Bethann Punches Treating Physician/Extender: Rudene Re in Treatment: 8 Encounter Discharge Information Items Discharge Pain Level: 0 Discharge Condition:  Stable Ambulatory Status: Wheelchair Discharge Destination: Home Transportation: Private Auto Accompanied By: caregiver Schedule Follow-up Appointment: Yes Medication Reconciliation completed and provided to Patient/Care Yes Jaquarius Seder: Provided on Clinical Summary of Care: 05/16/2016 Form Type Recipient Paper Patient ZI Electronic Signature(s) Signed: 05/16/2016 10:57:29 AM By: Gwenlyn Perking Entered By: Gwenlyn Perking on 05/16/2016 10:57:28 [name] [name] ([id number]) -------------------------------------------------------------------------------- Multi Wound Chart Details Patient Name: [name] [name] Date of Service: 05/16/2016 10:00 AM Medical Record Number: [id number] Patient Account Number: [id number] Date of Birth/Sex: 07-28-1986 (30 y.o. 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:Pressure Ulcer] [N/A:N/A] Comorbid History: [2:Hepatitis C, History of pressure wounds, Paraplegia, Seizure Disorder] [N/A:N/A] Date Acquired: [2:02/19/2016] [N/A:N/A] Weeks of Treatment: [2:8] [N/A:N/A] Wound Status: [2:Open] [N/A:N/A] Measurements L x W x D 0.9x0.3x0.1 [N/A:N/A] (cm) Area (cm) : [2:0.212] [N/A:N/A] Volume (cm) : [2:0.021] [N/A:N/A] % Reduction in Area: [2:77.50%] [N/A:N/A] % Reduction in Volume: 77.70% [N/A:N/A] Classification: [2:Category/Stage II] [N/A:N/A] Exudate Amount: [2:Medium] [N/A:N/A] Exudate Type: [2:Sanguinous] [N/A:N/A] Exudate Color: [2:red] [N/A:N/A] Wound Margin: [2:Flat and Intact]  [N/A:N/A] Granulation Amount: [2:Large (67-100%)] [N/A:N/A] Granulation Quality: [2:Pink, Friable] [N/A:N/A] Necrotic Amount: [2:None Present (0%)] [N/A:N/A] Exposed Structures: [2:Fascia: No Fat: No Tendon: No Muscle: No Joint: No] [N/A:N/A] Bone: No Limited to Skin Breakdown Epithelialization: Medium (34-66%) N/A N/A Periwound Skin Texture: Edema: No N/A N/A Excoriation: No Induration: No Callus: No Crepitus: No Fluctuance: No Friable: No Rash: No Scarring: No Periwound Skin Moist: Yes N/A N/A Moisture: Maceration: No Dry/Scaly: No Periwound Skin Color: Atrophie Blanche: No N/A N/A Cyanosis: No Ecchymosis: No Erythema: No Hemosiderin Staining: No  Mottled: No Pallor: No Rubor: No Temperature: No Abnormality N/A N/A Tenderness on No N/A N/A Palpation: Wound Preparation: Ulcer Cleansing: N/A N/A Rinsed/Irrigated with Saline Topical Anesthetic Applied: None Treatment Notes Electronic Signature(s) Signed: 05/16/2016 5:28:24 PM By: Alejandro Mulling Entered By: Alejandro Mulling on 05/16/2016 10:39:39 [name] [name] ([id number]) -------------------------------------------------------------------------------- Multi-Disciplinary Care Plan Details Patient Name: [name] [name] Date of Service: 05/16/2016 10:00 AM Medical Record Number: [id number] Patient Account Number: [id number] Date of Birth/Sex: [birth year] (30 y.o. Male) Treating RN: Phillis Haggis Primary Care Physician: Bethann Punches Other Clinician: Referring Physician: Bethann Punches Treating Physician/Extender: Rudene Re in Treatment: 8 Active Inactive Orientation to the Wound Care Program Nursing Diagnoses: Knowledge deficit related to the wound healing center program Goals: Patient/caregiver will verbalize understanding of the Wound Healing Center Program Date Initiated: 03/21/2016 Goal Status: Active Interventions: Provide education on orientation to the wound center Notes: Pressure Nursing  Diagnoses: Knowledge deficit related to management of pressures ulcers Goals: Patient will remain free of pressure ulcers Date Initiated: 03/21/2016 Goal Status: Active Interventions: Assess: immobility, friction, shearing, incontinence upon admission and as needed Notes: Wound/Skin Impairment Nursing Diagnoses: Impaired tissue integrity Goals: Ulcer/skin breakdown will have a volume reduction of 30% by week 4 Date Initiated: 03/21/2016 [name] [name] ([id number]) Goal Status: Active Interventions: Assess patient/caregiver ability to perform ulcer/skin care regimen upon admission and as needed Notes: Electronic Signature(s) Signed: 05/16/2016 5:28:24 PM By: Alejandro Mulling Entered By: Alejandro Mulling on 05/16/2016 10:39:31 [name] [name] ([id number]) -------------------------------------------------------------------------------- Pain Assessment Details Patient Name: [name] [name] Date of Service: 05/16/2016 10:00 AM Medical Record Number: [id number] Patient Account Number: [id number] Date of Birth/Sex: 1986/05/24 (30 y.o. Male) Treating RN: Curtis Sites Primary Care Physician: Bethann Punches Other Clinician: Referring Physician: Bethann Punches Treating Physician/Extender: Rudene Re in Treatment: 8 Active Problems Location of Pain Severity and Description of Pain Patient Has Paino Yes Site Locations Pain Location: Pain in Ulcers With Dressing Change: Yes Pain Management and Medication Current Pain Management: Notes Topical or injectable lidocaine is offered to patient for acute pain when surgical debridement is performed. If needed, Patient is instructed to use over the counter pain medication for the following 24-48 hours after debridement. Wound care MDs do not prescribed pain medications. Patient has chronic pain or uncontrolled pain. Patient has been instructed to make an appointment with their Primary Care Physician for pain management. Electronic  Signature(s) Signed: 05/16/2016 5:26:42 PM By: Curtis Sites Entered By: Curtis Sites on 05/16/2016 10:14:13 [name] [name] ([id number]) -------------------------------------------------------------------------------- Patient/Caregiver Education Details Patient Name: [name] [name] Date of Service: 05/16/2016 10:00 AM Medical Record Number: [id number] Patient Account Number: [id number] Date of Birth/Gender: 06-04-1986 (30 y.o. Male) Treating RN: Phillis Haggis Primary Care Physician: Bethann Punches Other Clinician: Referring Physician: Bethann Punches Treating Physician/Extender: Rudene Re in Treatment: 8 Education Assessment Education Provided To: Patient Education Topics Provided Wound/Skin Impairment: Handouts: Other: change dressing as ordered Methods: Demonstration, Explain/Verbal Responses: State content correctly Electronic Signature(s) Signed: 05/16/2016 5:28:24 PM By: Alejandro Mulling Entered By: Alejandro Mulling on 05/16/2016 10:44:55 [name] [name] ([id number]) -------------------------------------------------------------------------------- Wound Assessment Details Patient Name: [name] [name] Date of Service: 05/16/2016 10:00 AM Medical Record Number: [id number] Patient Account Number: [id number] Date of Birth/Sex: Oct 06, 1985 (30 y.o. Male) Treating RN: Curtis Sites Primary Care Physician: Bethann Punches Other Clinician: Referring Physician: Bethann Punches Treating Physician/Extender: Rudene Re in Treatment: 8 Wound Status Wound Number: 2 Primary Pressure Ulcer Etiology: Wound Location: Right Groin Wound Open Wounding Event: Gradually Appeared Status: Date Acquired: 02/19/2016 Comorbid Hepatitis C, History of pressure Weeks Of  Treatment: 8 History: wounds, Paraplegia, Seizure Disorder Clustered Wound: No Photos Wound Measurements Length: (cm) 0.9 Width: (cm) 0.3 Depth: (cm) 0.1 Area: (cm) 0.212 Volume: (cm) 0.021 % Reduction in Area:  77.5% % Reduction in Volume: 77.7% Epithelialization: Medium (34-66%) Tunneling: No Undermining: No Wound Description Classification: Category/Stage II Wound Margin: Flat and Intact Exudate Amount: Medium Exudate Type: Sanguinous Exudate Color: red Wound Bed Granulation Amount: Large (67-100%) Exposed Structure Granulation Quality: Pink, Friable Fascia Exposed: No Necrotic Amount: None Present (0%) Fat Layer Exposed: No Tendon Exposed: No Muscle Exposed: No Roussin, Earna Coder ([id number]) Joint Exposed: No Bone Exposed: No Limited to Skin Breakdown Periwound Skin Texture Texture Color No Abnormalities Noted: No No Abnormalities Noted: No Callus: No Atrophie Blanche: No Crepitus: No Cyanosis: No Excoriation: No Ecchymosis: No Fluctuance: No Erythema: No Friable: No Hemosiderin Staining: No Induration: No Mottled: No Localized Edema: No Pallor: No Rash: No Rubor: No Scarring: No Temperature / Pain Moisture Temperature: No Abnormality No Abnormalities Noted: No Dry / Scaly: No Maceration: No Moist: Yes Wound Preparation Ulcer Cleansing: Rinsed/Irrigated with Saline Topical Anesthetic Applied: None Treatment Notes Wound #2 (Right Groin) 1. Cleansed with: Clean wound with Normal Saline 2. Anesthetic Topical Lidocaine 4% cream to wound bed prior to debridement 3. Peri-wound Care: Antifungal cream 4. Dressing Applied: Aquacel Ag Notes Drawtex over aquacel ag Electronic Signature(s) Signed: 05/16/2016 5:26:42 PM By: Curtis Sites Entered By: Curtis Sites on 05/16/2016 11:30:35 [name] [name] ([id number]) -------------------------------------------------------------------------------- Vitals Details Patient Name: [name] [name] Date of Service: 05/16/2016 10:00 AM Medical Record Number: [id number] Patient Account Number: [id number] Date of Birth/Sex: Jul 09, 1986 (30 y.o. Male) Treating RN: Curtis Sites Primary Care Physician: Bethann Punches Other  Clinician: Referring Physician: Bethann Punches Treating Physician/Extender: Rudene Re in Treatment: 8 Vital Signs Time Taken: 10:14 Temperature (F): 98.5 Weight (lbs): 220 Pulse (bpm): 72 Respiratory Rate (breaths/min): 18 Blood Pressure (mmHg): 103/63 Reference Range: 80 - 120 mg / dl Electronic Signature(s) Signed: 05/16/2016 5:26:42 PM By: Curtis Sites Entered By: Curtis Sites on 05/16/2016 10:14:31

## 2016-05-17 NOTE — Progress Notes (Signed)
[name] [name], [name] [name] ([id number]) Visit Report for 05/16/2016 Chief Complaint Document Details Patient Name: [name] [name], [name] [name] Date of Service: 05/16/2016 10:00 AM Medical Record Number: [id number] Patient Account Number: [id number] Date of Birth/Sex: 07/11/1986 (30 y.o. Male) Treating RN: Curtis Sites Primary Care Physician: Bethann Punches Other Clinician: Referring Physician: Bethann Punches Treating Physician/Extender: Rudene Re in Treatment: 8 Information Obtained from: Caregiver Chief Complaint Patient is at the clinic for treatment of an open wounds to the right more than left groin for about a month Electronic Signature(s) Signed: 05/16/2016 10:47:14 AM By: Evlyn Kanner MD, FACS Entered By: Evlyn Kanner on 05/16/2016 10:47:13 [name] [name] ([id number]) -------------------------------------------------------------------------------- HPI Details Patient Name: [name] [name] Date of Service: 05/16/2016 10:00 AM Medical Record Number: [id number] Patient Account Number: [id number] Date of Birth/Sex: 1985/09/12 (30 y.o. Male) Treating RN: Curtis Sites Primary Care Physician: Bethann Punches Other Clinician: Referring Physician: Bethann Punches Treating Physician/Extender: Rudene Re in Treatment: 8 History of Present Illness Location: right more than left groin Quality: Patient reports No Pain. Severity: Patient states wound are getting worse. Duration: Patient has had the wound for > 1 months prior to seeking treatment at the wound center Context: The wound appeared gradually over time HPI Description: 30 year old male who has a history of traumatic brain injury and also has a ulcer on his left heel which was noted by the family about 4-5 months ago. The TBI was in January 2013 and prior to this visit the patient has had hyperbaric oxygen therapy approximately 2 years after his TBI. He is had several gastrostomy tube placed by the gastroenterologist but then finally had a  surgical procedure to bring the gastric mucosa to the skin surface. This was done approximately 9 months ago. Past medical history of hepatitis C, traumatic brain injury, pulmonary emboli, ileostomy, tracheostomy, PEG tube placement. Was a former smoker and quit in January 2013 He known to see [place] for various issues with wounds and this time around for about a month he has open areas near his groin possibly due to the contracture. No history of fungal infection of the groin. 04/04/2016 -- his caregiver says there is a lot of drainage and she has to often change it more than once a day because of the amount of drainage in this area. May 16 2016 -- the caregiver says that is continuous drainage which increases the redness and then he has multiple small linear superficial ulcerations. Electronic Signature(s) Signed: 05/16/2016 10:47:54 AM By: Evlyn Kanner MD, FACS Entered By: Evlyn Kanner on 05/16/2016 10:47:54 [name] [name] ([id number]) -------------------------------------------------------------------------------- Physical Exam Details Patient Name: [name] [name] Date of Service: 05/16/2016 10:00 AM Medical Record Number: [id number] Patient Account Number: [id number] Date of Birth/Sex: 02-Oct-1985 (30 y.o. Male) Treating RN: Curtis Sites Primary Care Physician: Bethann Punches Other Clinician: Referring Physician: Bethann Punches Treating Physician/Extender: Rudene Re in Treatment: 8 Constitutional . Pulse regular. Respirations normal and unlabored. Afebrile. . Eyes Nonicteric. Reactive to light. Ears, Nose, Mouth, and Throat Lips, teeth, and gums WNL.Marland Kitchen Moist mucosa without lesions. Neck supple and nontender. No palpable supraclavicular or cervical adenopathy. Normal sized without goiter. Respiratory WNL. No retractions.. Breath sounds WNL, No rubs, rales, rhonchi, or wheeze.. Cardiovascular Heart rhythm and rate regular, no murmur or gallop.. Pedal Pulses WNL. No clubbing,  cyanosis or edema. Lymphatic No adneopathy. No adenopathy. No adenopathy. Musculoskeletal Adexa without tenderness or enlargement.. Digits and nails w/o clubbing, cyanosis, infection, petechiae, ischemia, or inflammatory conditions.. Integumentary (Hair, Skin) No suspicious lesions. No crepitus or fluctuance. No peri-wound warmth  or erythema. No masses.Marland Kitchen Psychiatric Judgement and insight Intact.. No evidence of depression, anxiety, or agitation.. Notes groins have minimal redness but due to the moisture may have mild fungal infection. There are several small linear ulcerations none of them actively bleeding. Electronic Signature(s) Signed: 05/16/2016 10:48:33 AM By: Evlyn Kanner MD, FACS Entered By: Evlyn Kanner on 05/16/2016 10:48:33 [name] [name] ([id number]) -------------------------------------------------------------------------------- Physician Orders Details Patient Name: [name] [name] Date of Service: 05/16/2016 10:00 AM Medical Record Number: [id number] Patient Account Number: [id number] Date of Birth/Sex: September 19, 1985 (30 y.o. Male) Treating RN: Phillis Haggis Primary Care Physician: Bethann Punches Other Clinician: Referring Physician: Bethann Punches Treating Physician/Extender: Rudene Re in Treatment: 8 Verbal / Phone Orders: Yes Clinician: Ashok Cordia, Debi Read Back and Verified: Yes Diagnosis Coding Wound Cleansing Wound #2 Right Groin o Clean wound with Normal Saline. Anesthetic Wound #2 Right Groin o Topical Lidocaine 4% cream applied to wound bed prior to debridement Skin Barriers/Peri-Wound Care Wound #2 Right Groin o Antifungal cream - Lotrisome to affected area Primary Wound Dressing Wound #2 Right Groin o Aquacel Ag - over wound o Drawtex - over aquacel ag for moisture Dressing Change Frequency Wound #2 Right Groin o Change dressing every day. Follow-up Appointments Wound #2 Right Groin o Return Appointment in 2  weeks. Off-Loading Wound #2 Right Groin o Turn and reposition every 2 hours Patient Medications Allergies: Ambien, ativan, Depakote, dilauded, Keppra Notifications Medication Indication Start End Lotrisone 05/16/2016 DOSE topical 1 %-0.05 % cream - cream topical apply daily as directed [name] [name], [name] [name] ([id number]) Electronic Signature(s) Signed: 05/16/2016 10:46:44 AM By: Evlyn Kanner MD, FACS Entered By: Evlyn Kanner on 05/16/2016 10:46:43 [name] [name] ([id number]) -------------------------------------------------------------------------------- Problem List Details Patient Name: [name] [name] Date of Service: 05/16/2016 10:00 AM Medical Record Number: [id number] Patient Account Number: [id number] Date of Birth/Sex: 03/19/1986 (30 y.o. Male) Treating RN: Curtis Sites Primary Care Physician: Bethann Punches Other Clinician: Referring Physician: Bethann Punches Treating Physician/Extender: Rudene Re in Treatment: 8 Active Problems ICD-10 Encounter Code Description Active Date Diagnosis Z87.820 Personal history of traumatic brain injury 03/21/2016 Yes S31.501A Unspecified open wound of unspecified external genital 03/21/2016 Yes organs, male, initial encounter Inactive Problems Resolved Problems Electronic Signature(s) Signed: 05/16/2016 10:47:00 AM By: Evlyn Kanner MD, FACS Entered By: Evlyn Kanner on 05/16/2016 10:47:00 [name] [name] ([id number]) -------------------------------------------------------------------------------- Progress Note Details Patient Name: [name] [name] Date of Service: 05/16/2016 10:00 AM Medical Record Number: [id number] Patient Account Number: [id number] Date of Birth/Sex: [birth year] (30 y.o. Male) Treating RN: Curtis Sites Primary Care Physician: Bethann Punches Other Clinician: Referring Physician: Bethann Punches Treating Physician/Extender: Rudene Re in Treatment: 8 Subjective Chief Complaint Information obtained from  Caregiver Patient is at the clinic for treatment of an open wounds to the right more than left groin for about a month History of Present Illness (HPI) The following HPI elements were documented for the patient's wound: Location: right more than left groin Quality: Patient reports No Pain. Severity: Patient states wound are getting worse. Duration: Patient has had the wound for > 1 months prior to seeking treatment at the wound center Context: The wound appeared gradually over time 30 year old male who has a history of traumatic brain injury and also has a ulcer on his left heel which was noted by the family about 4-5 months ago. The TBI was in January 2013 and prior to this visit the patient has had hyperbaric oxygen therapy approximately 2 years after his TBI. He is had several gastrostomy tube placed by the gastroenterologist but then finally had  a surgical procedure to bring the gastric mucosa to the skin surface. This was done approximately 9 months ago. Past medical history of hepatitis C, traumatic brain injury, pulmonary emboli, ileostomy, tracheostomy, PEG tube placement. Was a former smoker and quit in January 2013 He known to see us for various issues with wounds and this time around for about a month he has open areas near his groin possibly due to the contracture. No history of fungal infection of the groin. 04/04/2016 -- his caregiver says there is a lot of drainage and she has to often change it more than once a day because of the amount of drainage in this area. May 16 2016 -- the caregiver says that is continuous drainage which increases the redness and then he has multiple small linear superficial ulcerations. Objective Constitutional Pulse regular. Respirations normal and unlabored. Afebrile. [name] HelperRBY, [name] [name] ([id number]) Vitals Time Taken: 10:14 AM, Weight: 220 lbs, Temperature: 98.5 F, Pulse: 72 bpm, Respiratory Rate: 18 breaths/min, Blood Pressure: 103/63  mmHg. Eyes Nonicteric. Reactive to light. Ears, Nose, Mouth, and Throat Lips, teeth, and gums WNL.Marland Kitchen. Moist mucosa without lesions. Neck supple and nontender. No palpable supraclavicular or cervical adenopathy. Normal sized without goiter. Respiratory WNL. No retractions.. Breath sounds WNL, No rubs, rales, rhonchi, or wheeze.. Cardiovascular Heart rhythm and rate regular, no murmur or gallop.. Pedal Pulses WNL. No clubbing, cyanosis or edema. Lymphatic No adneopathy. No adenopathy. No adenopathy. Musculoskeletal Adexa without tenderness or enlargement.. Digits and nails w/o clubbing, cyanosis, infection, petechiae, ischemia, or inflammatory conditions.Marland Kitchen. Psychiatric Judgement and insight Intact.. No evidence of depression, anxiety, or agitation.. General Notes: groins have minimal redness but due to the moisture may have mild fungal infection. There are several small linear ulcerations none of them actively bleeding. Integumentary (Hair, Skin) No suspicious lesions. No crepitus or fluctuance. No peri-wound warmth or erythema. No masses.. Wound #2 status is Open. Original cause of wound was Gradually Appeared. The wound is located on the Right Groin. The wound measures 0.9cm length x 0.3cm width x 0.1cm depth; 0.212cm^2 area and 0.021cm^3 volume. The wound is limited to skin breakdown. There is no tunneling or undermining noted. There is a medium amount of sanguinous drainage noted. The wound margin is flat and intact. There is large (67-100%) pink, friable granulation within the wound bed. There is no necrotic tissue within the wound bed. The periwound skin appearance exhibited: Moist. The periwound skin appearance did not exhibit: Callus, Crepitus, Excoriation, Fluctuance, Friable, Induration, Localized Edema, Rash, Scarring, Dry/Scaly, Maceration, Atrophie Blanche, Cyanosis, Ecchymosis, Hemosiderin Staining, Mottled, Pallor, Rubor, Erythema. Periwound temperature was noted as No  Abnormality. [name] HelperRBY, [name] [name] ([id number]) Assessment Active Problems ICD-10 [phone] - Personal history of traumatic brain injury S31.501A - Unspecified open wound of unspecified external genital organs, male, initial encounter I have recommended we apply Lotrisone cream on a daily basis and continue with Aquacel Ag and drawtex to be change as frequently as need be. I have discussed with the caregiver that this is going to be an ongoing problem due to the fact that his hips and thighs are flexed and it's difficult to absorb moisture in this area. Plan Wound Cleansing: Wound #2 Right Groin: Clean wound with Normal Saline. Anesthetic: Wound #2 Right Groin: Topical Lidocaine 4% cream applied to wound bed prior to debridement Skin Barriers/Peri-Wound Care: Wound #2 Right Groin: Antifungal cream - Lotrisome to affected area Primary Wound Dressing: Wound #2 Right Groin: Aquacel Ag - over wound Drawtex - over aquacel ag for moisture Dressing Change Frequency:  Wound #2 Right Groin: Change dressing every day. Follow-up Appointments: Wound #2 Right Groin: Return Appointment in 2 weeks. Off-Loading: Wound #2 Right Groin: Turn and reposition every 2 hours The following medication(s) was prescribed: Lotrisone topical 1 %-0.05 % cream cream topical apply daily as directed starting 05/16/2016 [name] [name] ([id number]) I have recommended we apply Lotrisone cream on a daily basis and continue with Aquacel Ag and drawtex to be change as frequently as need be. I have discussed with the caregiver that this is going to be an ongoing problem due to the fact that his hips and thighs are flexed and it's difficult to absorb moisture in this area. Electronic Signature(s) Signed: 05/16/2016 4:00:32 PM By: Evlyn Kanner MD, FACS Previous Signature: 05/16/2016 10:49:42 AM Version By: Evlyn Kanner MD, FACS Entered By: Evlyn Kanner on 05/16/2016 16:00:32 [name] [name]  ([id number]) -------------------------------------------------------------------------------- SuperBill Details Patient Name: [name] [name] Date of Service: 05/16/2016 Medical Record Number: [id number] Patient Account Number: [id number] Date of Birth/Sex: 18-Dec-1985 (30 y.o. Male) Treating RN: Curtis Sites Primary Care Physician: Bethann Punches Other Clinician: Referring Physician: Bethann Punches Treating Physician/Extender: Rudene Re in Treatment: 8 Diagnosis Coding ICD-10 Codes Code Description [phone] Personal history of traumatic brain injury S31.501A Unspecified open wound of unspecified external genital organs, male, initial encounter Facility Procedures CPT4 Code: [id number] Description: [phone] - WOUND CARE VISIT-LEV 2 EST PT Modifier: Quantity: 1 Physician Procedures CPT4: Description Modifier Quantity Code [id number] - WC PHYS LEVEL 3 - EST PT 1 ICD-10 Description Diagnosis Z87.820 Personal history of traumatic brain injury S31.501A Unspecified open wound of unspecified external genital organs, male, initial  encounter Electronic Signature(s) Signed: 05/16/2016 5:27:27 PM By: Elpidio Eric BSN, RN Previous Signature: 05/16/2016 10:49:53 AM Version By: Evlyn Kanner MD, FACS Entered By: Elpidio Eric on 05/16/2016 17:27:27

## 2016-05-30 ENCOUNTER — Encounter: Payer: 59 | Attending: Surgery | Admitting: Surgery

## 2016-05-30 ENCOUNTER — Telehealth: Payer: Self-pay | Admitting: *Deleted

## 2016-05-30 DIAGNOSIS — Z932 Ileostomy status: Secondary | ICD-10-CM | POA: Insufficient documentation

## 2016-05-30 DIAGNOSIS — Z930 Tracheostomy status: Secondary | ICD-10-CM | POA: Insufficient documentation

## 2016-05-30 DIAGNOSIS — X58XXXA Exposure to other specified factors, initial encounter: Secondary | ICD-10-CM | POA: Diagnosis not present

## 2016-05-30 DIAGNOSIS — S31501A Unspecified open wound of unspecified external genital organs, male, initial encounter: Secondary | ICD-10-CM | POA: Diagnosis not present

## 2016-05-30 DIAGNOSIS — Z87891 Personal history of nicotine dependence: Secondary | ICD-10-CM | POA: Diagnosis not present

## 2016-05-30 DIAGNOSIS — B1920 Unspecified viral hepatitis C without hepatic coma: Secondary | ICD-10-CM | POA: Insufficient documentation

## 2016-05-30 DIAGNOSIS — G8220 Paraplegia, unspecified: Secondary | ICD-10-CM | POA: Insufficient documentation

## 2016-05-30 DIAGNOSIS — Z87820 Personal history of traumatic brain injury: Secondary | ICD-10-CM | POA: Diagnosis not present

## 2016-05-30 NOTE — Telephone Encounter (Signed)
Patient's mother Beverely LowJeannie called to inquire about receiving a new gastrostomy tube with a larger balloon. She states at the office visit 03/20/16 it was talked about getting one sent to her for her son. She is just checking on the status?

## 2016-05-31 NOTE — Progress Notes (Signed)
DEZMON, CONOVER ([id number]) Visit Report for 05/30/2016 Arrival Information Details Patient Name: [name] [name] Date of Service: 05/30/2016 10:00 AM Medical Record Number: [id number] Patient Account Number: [id number] Date of Birth/Sex: 1986/04/29 (30 y.o. Male) Treating RN: Curtis Sites Primary Care Physician: Bethann Punches Other Clinician: Referring Physician: Bethann Punches Treating Physician/Extender: Rudene Re in Treatment: 10 Visit Information History Since Last Visit Added or deleted any medications: No Patient Arrived: Wheel Chair Any new allergies or adverse reactions: No Arrival Time: 10:05 Had a fall or experienced change in No activities of daily living that may affect Accompanied By: cg risk of falls: Transfer Assistance: Hoyer Lift Signs or symptoms of abuse/neglect since last No Patient Identification Verified: Yes visito Secondary Verification Process Yes Hospitalized since last visit: No Completed: Pain Present Now: Yes Patient Requires Transmission-Based No Precautions: Patient Has Alerts: No Electronic Signature(s) Signed: 05/30/2016 5:40:43 PM By: Curtis Sites Entered By: Curtis Sites on 05/30/2016 10:15:01 [name] [name] ([id number]) -------------------------------------------------------------------------------- Clinic Level of Care Assessment Details Patient Name: [name] [name] Date of Service: 05/30/2016 10:00 AM Medical Record Number: [id number] Patient Account Number: [id number] Date of Birth/Sex: 04/08/1986 (30 y.o. Male) Treating RN: Clover Mealy, RN, BSN, Rita Primary Care Physician: Bethann Punches Other Clinician: Referring Physician: Bethann Punches Treating Physician/Extender: Rudene Re in Treatment: 10 Clinic Level of Care Assessment Items TOOL 4 Quantity Score []  - Use when only an EandM is performed on FOLLOW-UP visit 0 ASSESSMENTS - Nursing Assessment / Reassessment X - Reassessment of Co-morbidities (includes updates in patient  status) 1 10 X - Reassessment of Adherence to Treatment Plan 1 5 ASSESSMENTS - Wound and Skin Assessment / Reassessment X - Simple Wound Assessment / Reassessment - one wound 1 5 []  - Complex Wound Assessment / Reassessment - multiple wounds 0 []  - Dermatologic / Skin Assessment (not related to wound area) 0 ASSESSMENTS - Focused Assessment []  - Circumferential Edema Measurements - multi extremities 0 []  - Nutritional Assessment / Counseling / Intervention 0 []  - Lower Extremity Assessment (monofilament, tuning fork, pulses) 0 []  - Peripheral Arterial Disease Assessment (using hand held doppler) 0 ASSESSMENTS - Ostomy and/or Continence Assessment and Care []  - Incontinence Assessment and Management 0 []  - Ostomy Care Assessment and Management (repouching, etc.) 0 PROCESS - Coordination of Care X - Simple Patient / Family Education for ongoing care 1 15 []  - Complex (extensive) Patient / Family Education for ongoing care 0 X - Staff obtains Chiropractor, Records, Test Results / Process Orders 1 10 []  - Staff telephones HHA, Nursing Homes / Clarify orders / etc 0 []  - Routine Transfer to another Facility (non-emergent condition) 0 [name] [name] ([id number]) []  - Routine Hospital Admission (non-emergent condition) 0 []  - New Admissions / Manufacturing engineer / Ordering NPWT, Apligraf, etc. 0 []  - Emergency Hospital Admission (emergent condition) 0 []  - Simple Discharge Coordination 0 []  - Complex (extensive) Discharge Coordination 0 PROCESS - Special Needs []  - Pediatric / Minor Patient Management 0 []  - Isolation Patient Management 0 []  - Hearing / Language / Visual special needs 0 []  - Assessment of Community assistance (transportation, D/C planning, etc.) 0 []  - Additional assistance / Altered mentation 0 []  - Support Surface(s) Assessment (bed, cushion, seat, etc.) 0 INTERVENTIONS - Wound Cleansing / Measurement X - Simple Wound Cleansing - one wound 1 5 []  - Complex Wound  Cleansing - multiple wounds 0 X - Wound Imaging (photographs - any number of wounds) 1 5 []  - Wound Tracing (instead of photographs) 0 X - Simple  Wound Measurement - one wound 1 5 []  - Complex Wound Measurement - multiple wounds 0 INTERVENTIONS - Wound Dressings X - Small Wound Dressing one or multiple wounds 1 10 []  - Medium Wound Dressing one or multiple wounds 0 []  - Large Wound Dressing one or multiple wounds 0 []  - Application of Medications - topical 0 []  - Application of Medications - injection 0 INTERVENTIONS - Miscellaneous []  - External ear exam 0 [name] [name] ([id number]) []  - Specimen Collection (cultures, biopsies, blood, body fluids, etc.) 0 []  - Specimen(s) / Culture(s) sent or taken to Lab for analysis 0 X - Patient Transfer (multiple staff / Michiel SitesHoyer Lift / Similar devices) 1 10 []  - Simple Staple / Suture removal (25 or less) 0 []  - Complex Staple / Suture removal (26 or more) 0 []  - Hypo / Hyperglycemic Management (close monitor of Blood Glucose) 0 []  - Ankle / Brachial Index (ABI) - do not check if billed separately 0 X - Vital Signs 1 5 Has the patient been seen at the hospital within the last three years: Yes Total Score: 85 Level Of Care: New/Established - Level 3 Electronic Signature(s) Signed: 05/30/2016 5:20:58 PM By: Elpidio EricAfful, Rita BSN, RN Entered By: Elpidio EricAfful, Rita on 05/30/2016 10:29:50 [name] HelperIRBY, [name] ([id number]) -------------------------------------------------------------------------------- Encounter Discharge Information Details Patient Name: [name] HelperIRBY, [name] Date of Service: 05/30/2016 10:00 AM Medical Record Number: [id number] Patient Account Number: [id number] Date of Birth/Sex: 02/08/1986 (30 y.o. Male) Treating RN: Curtis Sitesorthy, Joanna Primary Care Physician: Bethann PunchesMiller, Mark Other Clinician: Referring Physician: Bethann PunchesMiller, Mark Treating Physician/Extender: Rudene ReBritto, Errol Weeks in Treatment: 10 Encounter Discharge Information Items Discharge Pain Level: 0 Discharge  Condition: Stable Ambulatory Status: Wheelchair Discharge Destination: Home Transportation: Private Auto Accompanied By: cg Schedule Follow-up Appointment: Yes Medication Reconciliation completed and provided to Patient/Care No Aryahi Denzler: Provided on Clinical Summary of Care: 05/30/2016 Form Type Recipient Paper Patient ZI Electronic Signature(s) Signed: 05/30/2016 11:13:15 AM By: Curtis Sitesorthy, Joanna Previous Signature: 05/30/2016 10:45:30 AM Version By: Gwenlyn PerkingMoore, Shelia Entered By: Curtis Sitesorthy, Joanna on 05/30/2016 11:13:15 [name] HelperIRBY, Alixander ([id number]) -------------------------------------------------------------------------------- Multi Wound Chart Details Patient Name: [name] HelperIRBY, Heriberto Date of Service: 05/30/2016 10:00 AM Medical Record Number: [id number] Patient Account Number: [id number] Date of Birth/Sex: 08/08/1985 (30 y.o. Male) Treating RN: Clover MealyAfful, RN, BSN, Rita Primary Care Physician: Bethann PunchesMiller, Mark Other Clinician: Referring Physician: Bethann PunchesMiller, Mark Treating Physician/Extender: Rudene ReBritto, Errol Weeks in Treatment: 10 Vital Signs Height(in): Pulse(bpm): 75 Weight(lbs): 220 Blood Pressure 97/62 (mmHg): Body Mass Index(BMI): Temperature(F): 98.2 Respiratory Rate 16 (breaths/min): Photos: [2:No Photos] [N/A:N/A] Wound Location: [2:Right Groin] [N/A:N/A] Wounding Event: [2:Gradually Appeared] [N/A:N/A] Primary Etiology: [2:Pressure Ulcer] [N/A:N/A] Comorbid History: [2:Hepatitis C, History of pressure wounds, Paraplegia, Seizure Disorder] [N/A:N/A] Date Acquired: [2:02/19/2016] [N/A:N/A] Weeks of Treatment: [2:10] [N/A:N/A] Wound Status: [2:Open] [N/A:N/A] Measurements L x W x D 0.9x0.3x0.1 [N/A:N/A] (cm) Area (cm) : [2:0.212] [N/A:N/A] Volume (cm) : [2:0.021] [N/A:N/A] % Reduction in Area: [2:77.50%] [N/A:N/A] % Reduction in Volume: 77.70% [N/A:N/A] Classification: [2:Category/Stage II] [N/A:N/A] Exudate Amount: [2:Medium] [N/A:N/A] Exudate Type: [2:Sanguinous] [N/A:N/A] Exudate  Color: [2:red] [N/A:N/A] Wound Margin: [2:Flat and Intact] [N/A:N/A] Granulation Amount: [2:Large (67-100%)] [N/A:N/A] Granulation Quality: [2:Pink, Friable] [N/A:N/A] Necrotic Amount: [2:None Present (0%)] [N/A:N/A] Exposed Structures: [2:Fascia: No Fat: No Tendon: No Muscle: No Joint: No] [N/A:N/A] Bone: No Limited to Skin Breakdown Epithelialization: Medium (34-66%) N/A N/A Periwound Skin Texture: Edema: No N/A N/A Excoriation: No Induration: No Callus: No Crepitus: No Fluctuance: No Friable: No Rash: No Scarring: No Periwound Skin Moist: Yes N/A N/A Moisture: Maceration: No Dry/Scaly: No Periwound Skin Color: Atrophie Blanche:  No N/A N/A Cyanosis: No Ecchymosis: No Erythema: No Hemosiderin Staining: No Mottled: No Pallor: No Rubor: No Temperature: No Abnormality N/A N/A Tenderness on No N/A N/A Palpation: Wound Preparation: Ulcer Cleansing: N/A N/A Rinsed/Irrigated with Saline Topical Anesthetic Applied: None Treatment Notes Electronic Signature(s) Signed: 05/30/2016 5:20:58 PM By: Elpidio Eric BSN, RN Entered By: Elpidio Eric on 05/30/2016 10:27:18 [name] [name] ([id number]) -------------------------------------------------------------------------------- Multi-Disciplinary Care Plan Details Patient Name: [name] [name] Date of Service: 05/30/2016 10:00 AM Medical Record Number: [id number] Patient Account Number: [id number] Date of Birth/Sex: Dec 05, 1985 (30 y.o. Male) Treating RN: Clover Mealy, RN, BSN, Rita Primary Care Physician: Bethann Punches Other Clinician: Referring Physician: Bethann Punches Treating Physician/Extender: Rudene Re in Treatment: 10 Active Inactive Orientation to the Wound Care Program Nursing Diagnoses: Knowledge deficit related to the wound healing center program Goals: Patient/caregiver will verbalize understanding of the Wound Healing Center Program Date Initiated: 03/21/2016 Goal Status: Active Interventions: Provide education on  orientation to the wound center Notes: Pressure Nursing Diagnoses: Knowledge deficit related to management of pressures ulcers Goals: Patient will remain free of pressure ulcers Date Initiated: 03/21/2016 Goal Status: Active Interventions: Assess: immobility, friction, shearing, incontinence upon admission and as needed Notes: Wound/Skin Impairment Nursing Diagnoses: Impaired tissue integrity Goals: Ulcer/skin breakdown will have a volume reduction of 30% by week 4 Date Initiated: 03/21/2016 [name] [name] ([id number]) Goal Status: Active Interventions: Assess patient/caregiver ability to perform ulcer/skin care regimen upon admission and as needed Notes: Electronic Signature(s) Signed: 05/30/2016 5:20:58 PM By: Elpidio Eric BSN, RN Entered By: Elpidio Eric on 05/30/2016 10:27:10 [name] [name] ([id number]) -------------------------------------------------------------------------------- Pain Assessment Details Patient Name: [name] [name] Date of Service: 05/30/2016 10:00 AM Medical Record Number: [id number] Patient Account Number: [id number] Date of Birth/Sex: April 02, 1986 (30 y.o. Male) Treating RN: Curtis Sites Primary Care Physician: Bethann Punches Other Clinician: Referring Physician: Bethann Punches Treating Physician/Extender: Rudene Re in Treatment: 10 Active Problems Location of Pain Severity and Description of Pain Patient Has Paino Yes Site Locations Pain Location: Pain in Ulcers With Dressing Change: Yes Rate the pain. Current Pain Level: 1 Pain Management and Medication Current Pain Management: Notes Topical or injectable lidocaine is offered to patient for acute pain when surgical debridement is performed. If needed, Patient is instructed to use over the counter pain medication for the following 24-48 hours after debridement. Wound care MDs do not prescribed pain medications. Patient has chronic pain or uncontrolled pain. Patient has been instructed to make  an appointment with their Primary Care Physician for pain management. Electronic Signature(s) Signed: 05/30/2016 5:40:43 PM By: Curtis Sites Entered By: Curtis Sites on 05/30/2016 10:15:30 [name] [name] ([id number]) -------------------------------------------------------------------------------- Patient/Caregiver Education Details Patient Name: [name] [name] Date of Service: 05/30/2016 10:00 AM Medical Record Number: [id number] Patient Account Number: [id number] Date of Birth/Gender: 1986/04/15 (30 y.o. Male) Treating RN: Curtis Sites Primary Care Physician: Bethann Punches Other Clinician: Referring Physician: Bethann Punches Treating Physician/Extender: Rudene Re in Treatment: 10 Education Assessment Education Provided To: Patient and Caregiver Education Topics Provided Wound/Skin Impairment: Handouts: Other: continue wound care and skin care as ordered Methods: Demonstration, Explain/Verbal Responses: State content correctly Electronic Signature(s) Signed: 05/30/2016 5:40:43 PM By: Curtis Sites Entered By: Curtis Sites on 05/30/2016 11:13:39 [name] [name] ([id number]) -------------------------------------------------------------------------------- Wound Assessment Details Patient Name: [name] [name] Date of Service: 05/30/2016 10:00 AM Medical Record Number: [id number] Patient Account Number: [id number] Date of Birth/Sex: 09-09-1985 (30 y.o. Male) Treating RN: Curtis Sites Primary Care Physician: Bethann Punches Other Clinician: Referring Physician: Bethann Punches Treating Physician/Extender: Rudene Re in Treatment: 10  Wound Status Wound Number: 2 Primary Pressure Ulcer Etiology: Wound Location: Right Groin Wound Open Wounding Event: Gradually Appeared Status: Date Acquired: 02/19/2016 Comorbid Hepatitis C, History of pressure Weeks Of Treatment: 10 History: wounds, Paraplegia, Seizure Disorder Clustered Wound: No Photos Wound Measurements Length:  (cm) 0.9 Width: (cm) 0.3 Depth: (cm) 0.1 Area: (cm) 0.212 Volume: (cm) 0.021 % Reduction in Area: 77.5% % Reduction in Volume: 77.7% Epithelialization: Medium (34-66%) Tunneling: No Undermining: No Wound Description Classification: Category/Stage II Wound Margin: Flat and Intact Exudate Amount: Medium Exudate Type: Sanguinous Exudate Color: red Wound Bed Granulation Amount: Large (67-100%) Exposed Structure Granulation Quality: Pink, Friable Fascia Exposed: No Necrotic Amount: None Present (0%) Fat Layer Exposed: No Tendon Exposed: No Muscle Exposed: No Abdulaziz, Earna CoderZACHARY ([id number]) Joint Exposed: No Bone Exposed: No Limited to Skin Breakdown Periwound Skin Texture Texture Color No Abnormalities Noted: No No Abnormalities Noted: No Callus: No Atrophie Blanche: No Crepitus: No Cyanosis: No Excoriation: No Ecchymosis: No Fluctuance: No Erythema: No Friable: No Hemosiderin Staining: No Induration: No Mottled: No Localized Edema: No Pallor: No Rash: No Rubor: No Scarring: No Temperature / Pain Moisture Temperature: No Abnormality No Abnormalities Noted: No Dry / Scaly: No Maceration: No Moist: Yes Wound Preparation Ulcer Cleansing: Rinsed/Irrigated with Saline Topical Anesthetic Applied: None Treatment Notes Wound #2 (Right Groin) 1. Cleansed with: Clean wound with Normal Saline 4. Dressing Applied: Aquacel Ag Other dressing (specify in notes) Notes Drawtex over aquacel ag Electronic Signature(s) Signed: 05/30/2016 5:40:43 PM By: Curtis Sitesorthy, Joanna Entered By: Curtis Sitesorthy, Joanna on 05/30/2016 11:04:47 [name] HelperIRBY, Merric ([id number]) -------------------------------------------------------------------------------- Vitals Details Patient Name: [name] HelperIRBY, Anuj Date of Service: 05/30/2016 10:00 AM Medical Record Number: [id number] Patient Account Number: [id number] Date of Birth/Sex: 02/26/1986 (30 y.o. Male) Treating RN: Curtis Sitesorthy, Joanna Primary Care Physician: Bethann PunchesMiller,  Mark Other Clinician: Referring Physician: Bethann PunchesMiller, Mark Treating Physician/Extender: Rudene ReBritto, Errol Weeks in Treatment: 10 Vital Signs Time Taken: 10:07 Temperature (F): 98.2 Weight (lbs): 220 Pulse (bpm): 75 Respiratory Rate (breaths/min): 16 Blood Pressure (mmHg): 97/62 Reference Range: 80 - 120 mg / dl Electronic Signature(s) Signed: 05/30/2016 5:40:43 PM By: Curtis Sitesorthy, Joanna Entered By: Curtis Sitesorthy, Joanna on 05/30/2016 10:16:07

## 2016-05-31 NOTE — Progress Notes (Signed)
[name] [name], [name] [name] ([id number]) Visit Report for 05/30/2016 Chief Complaint Document Details Patient Name: [name] [name], [name] Date of Service: 05/30/2016 10:00 AM Medical Record Number: [id number] Patient Account Number: [id number] Date of Birth/Sex: 03/13/1986 (30 y.o. Male) Treating RN: [name] [name] Primary Care Physician: [name] [name] Other Clinician: Referring Physician: Bethann PunchesMiller, [name] Treating Physician/Extender: [name] [name] Weeks in Treatment: 10 Information Obtained from: Caregiver Chief Complaint Patient is at the clinic for treatment of an open wounds to the right more than left groin for about a month Electronic Signature(s) Signed: 05/30/2016 10:28:52 AM By: [name] KannerBritto, [name] MD, FACS Entered By: [name] KannerBritto, Sahaj [name] on 05/30/2016 10:28:51 [name] [name], [name] [name] ([id number]) -------------------------------------------------------------------------------- HPI Details Patient Name: [name] [name], [name] [name] Date of Service: 05/30/2016 10:00 AM Medical Record Number: [id number] Patient Account Number: [id number] Date of Birth/Sex: 03/23/1986 (30 y.o. Male) Treating RN: [name] [name] Primary Care Physician: [name] [name] Other Clinician: Referring Physician: Bethann PunchesMiller, [name] Treating Physician/Extender: [name] [name] Weeks in Treatment: 10 History of Present Illness Location: right more than left groin Quality: Patient reports No Pain. Severity: Patient states wound are getting worse. Duration: Patient has had the wound for > 1 months prior to seeking treatment at the wound center Context: The wound appeared gradually over time HPI Description: 30 year old male who has a history of traumatic brain injury and also has a ulcer on his left heel which was noted by the family about 4-5 months ago. The TBI was in January 2013 and prior to this visit the patient has had hyperbaric oxygen therapy approximately 2 years after his TBI. He is had several gastrostomy tube placed by the gastroenterologist but then finally had a  surgical procedure to bring the gastric mucosa to the skin surface. This was done approximately 9 months ago. Past medical history of hepatitis C, traumatic brain injury, pulmonary emboli, ileostomy, tracheostomy, PEG tube placement. Was a former smoker and quit in January 2013 He known to see us for various issues with wounds and this time around for about a month he has open areas near his groin possibly due to the contracture. No history of fungal infection of the groin. 04/04/2016 -- his caregiver says there is a lot of drainage and she has to often change it more than once a day because of the amount of drainage in this area. May 16 2016 -- the caregiver says that is continuous drainage which increases the redness and then he has multiple small linear superficial ulcerations. Electronic Signature(s) Signed: 05/30/2016 10:28:59 AM By: [name] KannerBritto, [name] MD, FACS Entered By: [name] [name] on 05/30/2016 10:28:59 [name] [name], [name] -------------------------------------------------------------------------------- Physical Exam Details Patient Name: [name] [name], [name] [name] Date of Service: 05/30/2016 10:00 AM Medical Record Number: [id number] Patient Account Number: [id number] Date of Birth/Sex: 06/13/1986 (30 y.o. Male) Treating RN: [name] [name] Primary Care Physician: [name] [name] Other Clinician: Referring Physician: Bethann PunchesMiller, [name] Treating Physician/Extender: [name] [name] Weeks in Treatment: 10 Constitutional . Pulse regular. Respirations normal and unlabored. Afebrile. . Eyes Nonicteric. Reactive to light. Ears, Nose, Mouth, and Throat Lips, teeth, and gums WNL.Marland Kitchen. Moist mucosa without lesions. Neck supple and nontender. No palpable supraclavicular or cervical adenopathy. Normal sized without goiter. Respiratory WNL. No retractions.. Cardiovascular Pedal Pulses WNL. No clubbing, cyanosis or edema. Lymphatic No adneopathy. No adenopathy. No adenopathy. Musculoskeletal Adexa without  tenderness or enlargement.. Digits and nails w/o clubbing, cyanosis, infection, petechiae, ischemia, or inflammatory conditions.. Integumentary (Hair, Skin) No suspicious lesions. No crepitus or fluctuance. No peri-wound warmth or erythema. No masses.Marland Kitchen. Psychiatric Judgement and insight Intact.. No evidence of depression, anxiety, or agitation.. Notes the  left side is completely healed and the right side has a superficial linear ulceration which is not infected and does not have any surrounding fungal infection. Electronic Signature(s) Signed: 05/30/2016 10:29:39 AM By: [name] KannerBritto, Khyler Urda MD, FACS Entered By: [name] KannerBritto, Acea Yagi on 05/30/2016 10:29:38 [name] [name], [name] -------------------------------------------------------------------------------- Physician Orders Details Patient Name: [name] [name], [name] [name] Date of Service: 05/30/2016 10:00 AM Medical Record Number: [id number] Patient Account Number: [id number] Date of Birth/Sex: 06/14/1986 (30 y.o. Male) Treating RN: [name] [name] Primary Care Physician: [name] [name] Other Clinician: Referring Physician: Bethann PunchesMiller, [name] Treating Physician/Extender: [name] [name] Weeks in Treatment: 10 Verbal / Phone Orders: Yes Clinician: Afful, RN, BSN, [name] Read Back and Verified: Yes Diagnosis Coding Wound Cleansing Wound #2 Right Groin o Clean wound with Normal Saline. Anesthetic Wound #2 Right Groin o Topical Lidocaine 4% cream applied to wound bed prior to debridement Skin Barriers/Peri-Wound Care Wound #2 Right Groin o Antifungal cream - Lotrisome to affected area as needed for redness Primary Wound Dressing Wound #2 Right Groin o Aquacel Ag - over wound o Drawtex - over aquacel ag for moisture Dressing Change Frequency Wound #2 Right Groin o Change dressing every day. Follow-up Appointments Wound #2 Right Groin o Return Appointment in 1 month - or can call as needed Off-Loading Wound #2 Right Groin o Turn and reposition  every 2 hours Electronic Signature(s) Signed: 05/30/2016 4:11:15 PM By: [name] KannerBritto, Aladdin Kollmann MD, FACS Signed: 05/30/2016 5:20:58 PM By: Elpidio EricAfful, [name] BSN, RN Entered By: Elpidio EricAfful, [name] on 05/30/2016 10:29:16 [name] [name], [name] [name] ([id number]) [name] [name], [name] [name] ([id number]) -------------------------------------------------------------------------------- Problem List Details Patient Name: [name] [name], [name] [name] Date of Service: 05/30/2016 10:00 AM Medical Record Number: [id number] Patient Account Number: [id number] Date of Birth/Sex: 12/21/1985 (30 y.o. Male) Treating RN: [name] [name] Primary Care Physician: [name] [name] Other Clinician: Referring Physician: Bethann PunchesMiller, [name] Treating Physician/Extender: [name] ReBritto, Tyree Fluharty Weeks in Treatment: 10 Active Problems ICD-10 Encounter Code Description Active Date Diagnosis Z87.820 Personal history of traumatic brain injury 03/21/2016 Yes S31.501A Unspecified open wound of unspecified external genital 03/21/2016 Yes organs, male, initial encounter Inactive Problems Resolved Problems Electronic Signature(s) Signed: 05/30/2016 10:28:45 AM By: [name] KannerBritto, Aubert Choyce MD, FACS Entered By: [name] KannerBritto, Carzell Saldivar on 05/30/2016 10:28:45 [name] [name], [name] [name] ([id number]) -------------------------------------------------------------------------------- Progress Note Details Patient Name: [name] [name], [name] [name] Date of Service: 05/30/2016 10:00 AM Medical Record Number: [id number] Patient Account Number: [id number] Date of Birth/Sex: 05/11/1986 (30 y.o. Male) Treating RN: [name] [name] Primary Care Physician: [name] [name] Other Clinician: Referring Physician: Bethann PunchesMiller, [name] Treating Physician/Extender: [name] ReBritto, Makynlie Rossini Weeks in Treatment: 10 Subjective Chief Complaint Information obtained from Caregiver Patient is at the clinic for treatment of an open wounds to the right more than left groin for about a month History of Present Illness (HPI) The following HPI elements were documented for the patient's wound: Location: right more  than left groin Quality: Patient reports No Pain. Severity: Patient states wound are getting worse. Duration: Patient has had the wound for > 1 months prior to seeking treatment at the wound center Context: The wound appeared gradually over time 30 year old male who has a history of traumatic brain injury and also has a ulcer on his left heel which was noted by the family about 4-5 months ago. The TBI was in January 2013 and prior to this visit the patient has had hyperbaric oxygen therapy approximately 2 years after his TBI. He is had several gastrostomy tube placed by the gastroenterologist but then finally had a surgical procedure to bring the gastric mucosa to the skin surface. This was done approximately 9 months ago. Past medical  history of hepatitis C, traumatic brain injury, pulmonary emboli, ileostomy, tracheostomy, PEG tube placement. Was a former smoker and quit in January 2013 He known to see [place] for various issues with wounds and this time around for about a month he has open areas near his groin possibly due to the contracture. No history of fungal infection of the groin. 04/04/2016 -- his caregiver says there is a lot of drainage and she has to often change it more than once a day because of the amount of drainage in this area. May 16 2016 -- the caregiver says that is continuous drainage which increases the redness and then he has multiple small linear superficial ulcerations. Objective Constitutional Pulse regular. Respirations normal and unlabored. Afebrile. HAO, DION ([id number]) Vitals Time Taken: 10:07 AM, Weight: 220 lbs, Temperature: 98.2 F, Pulse: 75 bpm, Respiratory Rate: 16 breaths/min, Blood Pressure: 97/62 mmHg. Eyes Nonicteric. Reactive to light. Ears, Nose, Mouth, and Throat Lips, teeth, and gums WNL.Marland Kitchen Moist mucosa without lesions. Neck supple and nontender. No palpable supraclavicular or cervical adenopathy. Normal sized without  goiter. Respiratory WNL. No retractions.. Cardiovascular Pedal Pulses WNL. No clubbing, cyanosis or edema. Lymphatic No adneopathy. No adenopathy. No adenopathy. Musculoskeletal Adexa without tenderness or enlargement.. Digits and nails w/o clubbing, cyanosis, infection, petechiae, ischemia, or inflammatory conditions.Marland Kitchen Psychiatric Judgement and insight Intact.. No evidence of depression, anxiety, or agitation.. General Notes: the left side is completely healed and the right side has a superficial linear ulceration which is not infected and does not have any surrounding fungal infection. Integumentary (Hair, Skin) No suspicious lesions. No crepitus or fluctuance. No peri-wound warmth or erythema. No masses.. Wound #2 status is Open. Original cause of wound was Gradually Appeared. The wound is located on the Right Groin. The wound measures 0.9cm length x 0.3cm width x 0.1cm depth; 0.212cm^2 area and 0.021cm^3 volume. The wound is limited to skin breakdown. There is no tunneling or undermining noted. There is a medium amount of sanguinous drainage noted. The wound margin is flat and intact. There is large (67-100%) pink, friable granulation within the wound bed. There is no necrotic tissue within the wound bed. The periwound skin appearance exhibited: Moist. The periwound skin appearance did not exhibit: Callus, Crepitus, Excoriation, Fluctuance, Friable, Induration, Localized Edema, Rash, Scarring, Dry/Scaly, Maceration, Atrophie Blanche, Cyanosis, Ecchymosis, Hemosiderin Staining, Mottled, Pallor, Rubor, Erythema. Periwound temperature was noted as No Abnormality. YAVUZ, KIRBY ([id number]) Assessment Active Problems ICD-10 [id number] - Personal history of traumatic brain injury S31.501A - Unspecified open wound of unspecified external genital organs, male, initial encounter Plan Wound Cleansing: Wound #2 Right Groin: Clean wound with Normal Saline. Anesthetic: Wound #2 Right  Groin: Topical Lidocaine 4% cream applied to wound bed prior to debridement Skin Barriers/Peri-Wound Care: Wound #2 Right Groin: Antifungal cream - Lotrisome to affected area as needed for redness Primary Wound Dressing: Wound #2 Right Groin: Aquacel Ag - over wound Drawtex - over aquacel ag for moisture Dressing Change Frequency: Wound #2 Right Groin: Change dressing every day. Follow-up Appointments: Wound #2 Right Groin: Return Appointment in 1 month - or can call as needed Off-Loading: Wound #2 Right Groin: Turn and reposition every 2 hours I have recommended we continue with Aquacel Ag and drawtex to be change as frequently as need be. I have discussed with the caregiver that this is going to be an ongoing problem due to the fact that his hips and thighs are flexed and it's difficult to absorb moisture in this area. CHALMERS, IDDINGS ([id number]) Electronic Signature(s)  Signed: 05/30/2016 4:14:36 PM By: [name] Kanner MD, FACS Previous Signature: 05/30/2016 10:30:05 AM Version By: [name] Kanner MD, FACS Entered By: [name] Kanner on 05/30/2016 16:14:36 [name] [name] ([id number]) -------------------------------------------------------------------------------- SuperBill Details Patient Name: [name] [name] Date of Service: 05/30/2016 Medical Record Number: [id number] Patient Account Number: [id number] Date of Birth/Sex: 1986/04/02 (30 y.o. Male) Treating RN: [name] Sites Primary Care Physician: [name] Punches Other Clinician: Referring Physician: Bethann Punches Treating Physician/Extender: [name] Re in Treatment: 10 Diagnosis Coding ICD-10 Codes Code Description [phone] Personal history of traumatic brain injury S31.501A Unspecified open wound of unspecified external genital organs, male, initial encounter Facility Procedures CPT4 Code: [id number] Description: [phone] - WOUND CARE VISIT-LEV 3 EST PT Modifier: Quantity: 1 Physician Procedures CPT4: Description Modifier  Quantity Code [id number] - WC PHYS LEVEL 3 - EST PT 1 ICD-10 Description Diagnosis Z87.820 Personal history of traumatic brain injury S31.501A Unspecified open wound of unspecified external genital organs, male, initial  encounter Electronic Signature(s) Signed: 05/30/2016 10:30:18 AM By: [name] Kanner MD, FACS Entered By: [name] Kanner on 05/30/2016 10:30:16

## 2016-06-06 NOTE — Telephone Encounter (Signed)
Faxed to [facility] as requested.

## 2016-06-06 NOTE — Telephone Encounter (Signed)
Called and left message, possibly get [facility] to fax order form for us to sign for the G tube.

## 2016-06-06 NOTE — Telephone Encounter (Signed)
Arrange for a 16 F, 30 cc balloon gastrostomy tube be sent to the patient's home for his mother to use, with one spare.

## 2016-06-27 ENCOUNTER — Encounter: Payer: 59 | Admitting: Nurse Practitioner

## 2016-06-27 DIAGNOSIS — S31501A Unspecified open wound of unspecified external genital organs, male, initial encounter: Secondary | ICD-10-CM | POA: Diagnosis not present

## 2016-06-28 NOTE — Progress Notes (Addendum)
ANTINIO, SANDERFER ([id number]) Visit Report for 06/27/2016 Arrival Information Details Patient Name: [name] [name] Date of Service: 06/27/2016 9:45 AM Medical Record Number: [id number] Patient Account Number: [id number] Date of Birth/Sex: 1985/08/30 (30 y.o. Male) Treating RN: Huel Coventry Primary Care Physician: Bethann Punches Other Clinician: Referring Physician: Bethann Punches Treating Physician/Extender: Kathreen Cosier in Treatment: 14 Visit Information History Since Last Visit Added or deleted any medications: No Patient Arrived: Wheel Chair Any new allergies or adverse reactions: No Arrival Time: 10:21 Had a fall or experienced change in No activities of daily living that may affect Accompanied By: caregiver risk of falls: Transfer Assistance: Nurse, adult Signs or symptoms of abuse/neglect since last No Patient Identification Verified: Yes visito Secondary Verification Process Yes Hospitalized since last visit: No Completed: Pain Present Now: No Patient Requires Transmission-Based No Precautions: Patient Has Alerts: No Electronic Signature(s) Signed: 06/27/2016 4:40:34 PM By: Elliot Gurney, RN, BSN, Kim RN, BSN Entered By: Elliot Gurney, RN, BSN, Kim on 06/27/2016 10:21:34 [name] [name] ([id number]) -------------------------------------------------------------------------------- Clinic Level of Care Assessment Details Patient Name: [name] [name] Date of Service: 06/27/2016 9:45 AM Medical Record Number: [id number] Patient Account Number: [id number] Date of Birth/Sex: 07-08-1986 (30 y.o. Male) Treating RN: Huel Coventry Primary Care Physician: Bethann Punches Other Clinician: Referring Physician: Bethann Punches Treating Physician/Extender: Kathreen Cosier in Treatment: 14 Clinic Level of Care Assessment Items TOOL 4 Quantity Score []  - Use when only an EandM is performed on FOLLOW-UP visit 0 ASSESSMENTS - Nursing Assessment / Reassessment []  - Reassessment of Co-morbidities (includes updates  in patient status) 0 X - Reassessment of Adherence to Treatment Plan 1 5 ASSESSMENTS - Wound and Skin Assessment / Reassessment X - Simple Wound Assessment / Reassessment - one wound 1 5 []  - Complex Wound Assessment / Reassessment - multiple wounds 0 []  - Dermatologic / Skin Assessment (not related to wound area) 0 ASSESSMENTS - Focused Assessment []  - Circumferential Edema Measurements - multi extremities 0 []  - Nutritional Assessment / Counseling / Intervention 0 []  - Lower Extremity Assessment (monofilament, tuning fork, pulses) 0 []  - Peripheral Arterial Disease Assessment (using hand held doppler) 0 ASSESSMENTS - Ostomy and/or Continence Assessment and Care []  - Incontinence Assessment and Management 0 []  - Ostomy Care Assessment and Management (repouching, etc.) 0 PROCESS - Coordination of Care X - Simple Patient / Family Education for ongoing care 1 15 []  - Complex (extensive) Patient / Family Education for ongoing care 0 []  - Staff obtains Chiropractor, Records, Test Results / Process Orders 0 []  - Staff telephones HHA, Nursing Homes / Clarify orders / etc 0 []  - Routine Transfer to another Facility (non-emergent condition) 0 Teters, Jalon ([id number]) []  - Routine Hospital Admission (non-emergent condition) 0 []  - New Admissions / Manufacturing engineer / Ordering NPWT, Apligraf, etc. 0 []  - Emergency Hospital Admission (emergent condition) 0 X - Simple Discharge Coordination 1 10 []  - Complex (extensive) Discharge Coordination 0 PROCESS - Special Needs []  - Pediatric / Minor Patient Management 0 []  - Isolation Patient Management 0 []  - Hearing / Language / Visual special needs 0 []  - Assessment of Community assistance (transportation, D/C planning, etc.) 0 []  - Additional assistance / Altered mentation 0 []  - Support Surface(s) Assessment (bed, cushion, seat, etc.) 0 INTERVENTIONS - Wound Cleansing / Measurement X - Simple Wound Cleansing - one wound 1 5 []  - Complex Wound  Cleansing - multiple wounds 0 X - Wound Imaging (photographs - any number of wounds) 1 5 []  - Wound Tracing (instead of photographs) 0 []  -  Simple Wound Measurement - one wound 0 []  - Complex Wound Measurement - multiple wounds 0 INTERVENTIONS - Wound Dressings []  - Small Wound Dressing one or multiple wounds 0 []  - Medium Wound Dressing one or multiple wounds 0 []  - Large Wound Dressing one or multiple wounds 0 []  - Application of Medications - topical 0 []  - Application of Medications - injection 0 INTERVENTIONS - Miscellaneous []  - External ear exam 0 Fedder, Bonny ([id number]) []  - Specimen Collection (cultures, biopsies, blood, body fluids, etc.) 0 []  - Specimen(s) / Culture(s) sent or taken to Lab for analysis 0 X - Patient Transfer (multiple staff / Michiel SitesHoyer Lift / Similar devices) 1 10 []  - Simple Staple / Suture removal (25 or less) 0 []  - Complex Staple / Suture removal (26 or more) 0 []  - Hypo / Hyperglycemic Management (close monitor of Blood Glucose) 0 []  - Ankle / Brachial Index (ABI) - do not check if billed separately 0 X - Vital Signs 1 5 Has the patient been seen at the hospital within the last three years: Yes Total Score: 60 Level Of Care: New/Established - Level 2 Electronic Signature(s) Signed: 06/27/2016 4:40:34 PM By: Elliot GurneyWoody, RN, BSN, Kim RN, BSN Entered By: Elliot GurneyWoody, RN, BSN, Kim on 06/27/2016 11:00:25 [name] HelperIRBY, Makhi ([id number]) -------------------------------------------------------------------------------- Encounter Discharge Information Details Patient Name: [name] HelperIRBY, Tion Date of Service: 06/27/2016 9:45 AM Medical Record Number: [id number] Patient Account Number: [id number] Date of Birth/Sex: 02/10/1986 (30 y.o. Male) Treating RN: Curtis Sitesorthy, Joanna Primary Care Physician: Bethann PunchesMiller, Mark Other Clinician: Referring Physician: Bethann PunchesMiller, Mark Treating Physician/Extender: Kathreen Cosieroulter, Leah Weeks in Treatment: 14 Encounter Discharge Information Items Discharge Pain Level:  0 Discharge Condition: Stable Ambulatory Status: Wheelchair Discharge Destination: Home Transportation: Private Auto Accompanied By: caregiver Schedule Follow-up Appointment: Yes Medication Reconciliation completed and provided to Patient/Care Yes Sonoma Firkus: Provided on Clinical Summary of Care: 06/27/2016 Form Type Recipient Paper Patient ZI Electronic Signature(s) Signed: 06/27/2016 4:40:34 PM By: Elliot GurneyWoody, RN, BSN, Kim RN, BSN Previous Signature: 06/27/2016 11:00:26 AM Version By: Gwenlyn PerkingMoore, Shelia Entered By: Elliot GurneyWoody, RN, BSN, Kim on 06/27/2016 11:01:52 [name] HelperIRBY, Reubin ([id number]) -------------------------------------------------------------------------------- Multi Wound Chart Details Patient Name: [name] HelperIRBY, Demarr Date of Service: 06/27/2016 9:45 AM Medical Record Number: [id number] Patient Account Number: [id number] Date of Birth/Sex: 06/12/1986 (30 y.o. 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] [N/A:N/A] Comorbid History: [2:Hepatitis C, History of pressure wounds, Paraplegia, Seizure Disorder] [N/A:N/A] Date Acquired: [2:02/19/2016] [N/A:N/A] Weeks of Treatment: [2:14] [N/A:N/A] Wound Status: [2:Open] [N/A:N/A] Measurements L x W x D 0x0x0 [N/A:N/A] (cm) Area (cm) : [2:0] [N/A:N/A] Volume (cm) : [2:0] [N/A:N/A] % Reduction in Area: [2:100.00%] [N/A:N/A] % Reduction in Volume: 100.00% [N/A:N/A] Classification: [2:Category/Stage II] [N/A:N/A] Exudate Amount: [2:None Present] [N/A:N/A] Wound Margin: [2:Flat  and Intact] [N/A:N/A] Granulation Amount: [2:None Present (0%)] [N/A:N/A] Necrotic Amount: [2:None Present (0%)] [N/A:N/A] Exposed Structures: [2:Fascia: No Fat: No Tendon: No Muscle: No Joint: No Bone: No Limited to Skin Breakdown] [N/A:N/A] Epithelialization: Large (67-100%) N/A N/A Periwound Skin Texture: Edema: No N/A N/A Excoriation: No Induration: No Callus: No Crepitus: No Fluctuance: No Friable: No Rash: No Scarring: No Periwound Skin Moist: Yes N/A N/A Moisture: Maceration: No Dry/Scaly: No Periwound Skin Color: Atrophie Blanche: No N/A N/A Cyanosis:  No Ecchymosis: No Erythema: No Hemosiderin Staining: No Mottled: No Pallor: No Rubor: No Temperature: No Abnormality N/A N/A Tenderness on No N/A N/A Palpation: Wound Preparation: Ulcer Cleansing: Not N/A N/A Cleansed Topical Anesthetic Applied: None Treatment Notes Electronic Signature(s) Signed: 06/27/2016 4:40:34 PM By: Elliot Gurney, RN, BSN, Kim RN, BSN Entered By: Elliot Gurney, RN, BSN, Kim on 06/27/2016 10:27:54 [name] [name] ([id number]) -------------------------------------------------------------------------------- Multi-Disciplinary Care Plan Details Patient Name: [name] [name] Date of Service: 06/27/2016 9:45 AM Medical Record Number: [id number] Patient Account Number: [id number] Date of Birth/Sex: [birth year] (30 y.o. Male) Treating RN: Huel Coventry Primary Care Physician: Bethann Punches Other Clinician: Referring Physician: Bethann Punches Treating Physician/Extender: Kathreen Cosier in Treatment: 14 Active Inactive Electronic Signature(s) Signed: 07/03/2016 5:15:55 PM By: Elliot Gurney, RN, BSN, Kim RN, BSN Previous Signature: 06/27/2016 4:40:34 PM Version By: Elliot Gurney, RN, BSN, Kim RN, BSN Entered By: Elliot Gurney, RN, BSN, Kim on 07/03/2016 17:15:54 [name] [name] ([id number]) -------------------------------------------------------------------------------- Pain Assessment Details Patient Name: [name] [name] Date of Service:  06/27/2016 9:45 AM Medical Record Number: [id number] Patient Account Number: [id number] Date of Birth/Sex: 06/03/1986 (30 y.o. Male) Treating RN: Huel Coventry Primary Care Physician: Bethann Punches Other Clinician: Referring Physician: Bethann Punches Treating Physician/Extender: Kathreen Cosier in Treatment: 14 Active Problems Location of Pain Severity and Description of Pain Patient Has Paino No Site Locations With Dressing Change: No Pain Management and Medication Current Pain Management: Electronic Signature(s) Signed: 06/27/2016 4:40:34 PM By: Elliot Gurney, RN, BSN, Kim RN, BSN Entered By: Elliot Gurney, RN, BSN, Kim on 06/27/2016 10:21:42 [name] [name] ([id number]) -------------------------------------------------------------------------------- Wound Assessment Details Patient Name: [name] [name] Date of Service: 06/27/2016 9:45 AM Medical Record Number: [id number] Patient Account Number: [id number] Date of Birth/Sex: 15-Feb-1986 (30 y.o. Male) Treating RN: Huel Coventry Primary Care Physician: Bethann Punches Other Clinician: Referring Physician: Bethann Punches Treating Physician/Extender: Kathreen Cosier in Treatment: 14 Wound Status Wound Number: 2 Primary Pressure Ulcer Etiology: Wound Location: Right Groin Wound Open Wounding Event: Gradually Appeared Status: Date Acquired: 02/19/2016 Comorbid Hepatitis C, History of pressure Weeks Of Treatment: 14 History: wounds, Paraplegia, Seizure Disorder Clustered Wound: No Photos Photo Uploaded By: Elliot Gurney, RN, BSN, Kim on 06/27/2016 11:36:53 Wound Measurements Length: (cm) 0 % Reduction in Width: (cm) 0 % Reduction in Depth: (cm) 0 Epithelializati Area: (cm) 0 Tunneling: Volume: (cm) 0 Undermining: Area: 100% Volume: 100% on: Large (67-100%) No No Wound Description Classification: Category/Stage II Wound Margin: Flat and Intact Exudate Amount: None Present Foul Odor After Cleansing: No Wound Bed Granulation Amount: None Present  (0%) Exposed Structure Necrotic Amount: None Present (0%) Fascia Exposed: No Fat Layer Exposed: No Tendon Exposed: No Muscle Exposed: No Joint Exposed: No Bone Exposed: No Limited to Skin Breakdown Yuhasz, Earna Coder ([id number]) Periwound Skin Texture Texture Color No Abnormalities Noted: No No Abnormalities Noted: No Callus: No Atrophie Blanche: No Crepitus: No Cyanosis: No Excoriation: No Ecchymosis: No Fluctuance: No Erythema: No Friable: No Hemosiderin Staining: No Induration: No Mottled: No Localized Edema: No Pallor: No Rash: No Rubor: No Scarring: No Temperature / Pain Moisture Temperature: No Abnormality No Abnormalities Noted: No Dry / Scaly: No Maceration: No Moist: Yes Wound Preparation Ulcer Cleansing: Not Cleansed Topical Anesthetic Applied: None Electronic Signature(s) Signed: 06/27/2016 4:40:34 PM By: Elliot Gurney, RN, BSN, Kim RN, BSN Entered By: Elliot Gurney, RN, BSN, Kim on 06/27/2016 10:27:26 [name] [name] ([id number]) -------------------------------------------------------------------------------- Vitals Details Patient Name: [name] [name] Date of Service: 06/27/2016 9:45 AM Medical Record Number: [id number] Patient Account Number: [id number] Date of Birth/Sex: July 03, 1986 (30 y.o. Male) Treating RN: Huel Coventry Primary Care Physician:  Bethann PunchesMiller, Mark Other Clinician: Referring Physician: Bethann PunchesMiller, Mark Treating Physician/Extender: Kathreen Cosieroulter, Leah Weeks in Treatment: 14 Vital Signs Time Taken: 10:21 Temperature (F): 98.0 Weight (lbs): 220 Pulse (bpm): 67 Respiratory Rate (breaths/min): 16 Blood Pressure (mmHg): 118/70 Reference Range: 80 - 120 mg / dl Electronic Signature(s) Signed: 06/27/2016 4:40:34 PM By: Elliot GurneyWoody, RN, BSN, Kim RN, BSN Entered By: Elliot GurneyWoody, RN, BSN, Kim on 06/27/2016 10:22:12

## 2016-06-29 NOTE — Progress Notes (Signed)
[name] [name], Malikah ([id number]) Visit Report for 06/27/2016 Chief Complaint Document Details Patient Name: [name] [name], [name] [name] Date of Service: 06/27/2016 9:45 AM Medical Record Number: [id number] Patient Account Number: [id number] Date of Birth/Sex: 01/04/1986 (30 y.o. Male) Treating RN: Curtis Sitesorthy, Joanna Primary Care Physician: Bethann PunchesMiller, Mark Other Clinician: Referring Physician: Bethann PunchesMiller, Mark Treating Physician/Extender: Kathreen Cosieroulter, Ericha Whittingham Weeks in Treatment: 14 Information Obtained from: Caregiver Chief Complaint [name] returns for follow-up of open ulcerations to groin Electronic Signature(s) Signed: 06/27/2016 11:41:24 AM By: Penne Lashoulter, NP, Shaylan Tutton Entered By: Penne Lashoulter, NP, Helaina Stefano on 06/27/2016 11:41:24 [name] [name], Dash ([id number]) -------------------------------------------------------------------------------- HPI Details Patient Name: [name] [name], [name] [name] Date of Service: 06/27/2016 9:45 AM Medical Record Number: [id number] Patient Account Number: [id number] Date of Birth/Sex: 09/16/1985 (30 y.o. Male) Treating RN: Curtis Sitesorthy, Joanna Primary Care Physician: Bethann PunchesMiller, Mark Other Clinician: Referring Physician: Bethann PunchesMiller, Mark Treating Physician/Extender: Kathreen Cosieroulter, Indi Willhite Weeks in Treatment: 14 History of Present Illness Location: right more than left groin Quality: Patient reports No Pain. Severity: Patient states wound are getting worse. Duration: Patient has had the wound for > 1 months prior to seeking treatment at the wound center Context: The wound appeared gradually over time HPI Description: 30 year old male who has a history of traumatic brain injury and also has a ulcer on his left heel which was noted by the family about 4-5 months ago. The TBI was in January 2013 and prior to this visit the patient has had hyperbaric oxygen therapy approximately 2 years after his TBI. He is had several gastrostomy tube placed by the gastroenterologist but then finally had a surgical procedure to bring the gastric mucosa to the  skin surface. This was done approximately 9 months ago. Past medical history of hepatitis C, traumatic brain injury, pulmonary emboli, ileostomy, tracheostomy, PEG tube placement. Was a former smoker and quit in January 2013 He known to see us for various issues with wounds and this time around for about a month he has open areas near his groin possibly due to the contracture. No history of fungal infection of the groin. 04/04/2016 -- his caregiver says there is a lot of drainage and she has to often change it more than once a day because of the amount of drainage in this area. May 16 2016 -- the caregiver says that is continuous drainage which increases the redness and then he has multiple small linear superficial ulcerations. 06-27-16 [name] presents today accompanied by his caregiver for follow-up. The caregiver denies any issues or changes since the last appointment and admits to using nystatin powder to bilateral groins at bedtime and wicking moisture during the day with drawtex. The caregiver also admits to attempts and dietary changes in an effort to reduce caloric intake and achieve weight loss. Electronic Signature(s) Signed: 06/27/2016 11:50:15 AM By: Penne Lashoulter, NP, Reggie PileLeah Entered By: Penne Lashoulter, NP, Braxton Vantrease on 06/27/2016 11:50:15 [name] [name], Rodgerick ([id number]) -------------------------------------------------------------------------------- Physical Exam Details Patient Name: [name] [name], [name] [name] Date of Service: 06/27/2016 9:45 AM Medical Record Number: [id number] Patient Account Number: [id number] Date of Birth/Sex: 01/05/1986 (30 y.o. Male) Treating RN: Curtis Sitesorthy, Joanna Primary Care Physician: Bethann PunchesMiller, Mark Other Clinician: Referring Physician: Bethann PunchesMiller, Mark Treating Physician/Extender: Kathreen Cosieroulter, Sanjana Folz Weeks in Treatment: 14 Constitutional BP within normal limits. afebrile. Respiratory non-labored respiratory effort. Musculoskeletal non-ambulatory; bed/chair bound; Hoyer lift for  transfer. Integumentary (Hair, Skin) no open areas to bilateral groins. no areas of induration or fluctuance. Psychiatric cognitive impairment. alert, unintelligible language. calm, pleasant, interactive with staff. Electronic Signature(s) Signed: 06/27/2016 11:52:05 AM By: Penne Lashoulter, NP, Reggie PileLeah Entered By: Penne Lashoulter, NP, Raffael Bugarin on 06/27/2016 11:52:05 Tosh,  Earna Coder ([id number]) -------------------------------------------------------------------------------- Physician Orders Details Patient Name: [name], [name] Date of Service: 06/27/2016 9:45 AM Medical Record Number: [id number] Patient Account Number: [id number] Date of Birth/Sex: 1986/04/01 (30 y.o. Male) Treating RN: Huel Coventry Primary Care Physician: Bethann Punches Other Clinician: Referring Physician: Bethann Punches Treating Physician/Extender: Kathreen Cosier in Treatment: 14 Verbal / Phone Orders: Yes Clinician: Huel Coventry Read Back and Verified: Yes Diagnosis Coding Discharge From [facility] o Discharge from Wound Care Center - Treatment complete Electronic Signature(s) Signed: 06/27/2016 4:40:34 PM By: Elliot Gurney RN, BSN, Kim RN, BSN Signed: 06/28/2016 8:33:28 AM By: Penne Lash, NP, Lex Linhares Entered By: Elliot Gurney RN, BSN, Kim on 06/27/2016 10:58:26 [name] Helper ([id number]) -------------------------------------------------------------------------------- Problem List Details Patient Name: [name] Helper Date of Service: 06/27/2016 9:45 AM Medical Record Number: [id number] Patient Account Number: [id number] Date of Birth/Sex: 01/16/1986 (30 y.o. Male) Treating RN: Curtis Sites Primary Care Physician: Bethann Punches Other Clinician: Referring Physician: Bethann Punches Treating Physician/Extender: Kathreen Cosier in Treatment: 14 Active Problems ICD-10 Encounter Code Description Active Date Diagnosis S31.501A Unspecified open wound of unspecified external genital 03/21/2016 Yes organs, male, initial encounter Z87.820 Personal history of  traumatic brain injury 03/21/2016 Yes Inactive Problems Resolved Problems Electronic Signature(s) Signed: 06/27/2016 11:40:59 AM By: Penne Lash, NP, Wilbert Hayashi Entered By: Penne Lash, NP, Jj Enyeart on 06/27/2016 11:40:59 [name] Helper ([id number]) -------------------------------------------------------------------------------- Progress Note Details Patient Name: [name] Helper Date of Service: 06/27/2016 9:45 AM Medical Record Number: [id number] Patient Account Number: [id number] Date of Birth/Sex: July 22, 1986 (30 y.o. Male) Treating RN: Curtis Sites Primary Care Physician: Bethann Punches Other Clinician: Referring Physician: Bethann Punches Treating Physician/Extender: Kathreen Cosier in Treatment: 14 Subjective Chief Complaint Information obtained from Caregiver Mr. Parco returns for follow-up of open ulcerations to groin History of Present Illness (HPI) The following HPI elements were documented for the patient's wound: Location: right more than left groin Quality: Patient reports No Pain. Severity: Patient states wound are getting worse. Duration: Patient has had the wound for > 1 months prior to seeking treatment at the wound center Context: The wound appeared gradually over time 30 year old male who has a history of traumatic brain injury and also has a ulcer on his left heel which was noted by the family about 4-5 months ago. The TBI was in January 2013 and prior to this visit the patient has had hyperbaric oxygen therapy approximately 2 years after his TBI. He is had several gastrostomy tube placed by the gastroenterologist but then finally had a surgical procedure to bring the gastric mucosa to the skin surface. This was done approximately 9 months ago. Past medical history of hepatitis C, traumatic brain injury, pulmonary emboli, ileostomy, tracheostomy, PEG tube placement. Was a former smoker and quit in January 2013 He known to see [place] for various issues with wounds and this time around for  about a month he has open areas near his groin possibly due to the contracture. No history of fungal infection of the groin. 04/04/2016 -- his caregiver says there is a lot of drainage and she has to often change it more than once a day because of the amount of drainage in this area. May 16 2016 -- the caregiver says that is continuous drainage which increases the redness and then he has multiple small linear superficial ulcerations. 06-27-16 Mr. Huss presents today accompanied by his caregiver for follow-up. The caregiver denies any issues or changes since the last appointment and admits to using nystatin powder to bilateral groins at bedtime and wicking moisture during the day with drawtex. The  caregiver also admits to attempts and dietary changes in an effort to reduce caloric intake and achieve weight loss. [name] [name], Arden ([id number]) Objective Constitutional BP within normal limits. afebrile. Vitals Time Taken: 10:21 AM, Weight: 220 lbs, Temperature: 98.0 F, Pulse: 67 bpm, Respiratory Rate: 16 breaths/min, Blood Pressure: 118/70 mmHg. Respiratory non-labored respiratory effort. Musculoskeletal non-ambulatory; bed/chair bound; Hoyer lift for transfer. Psychiatric cognitive impairment. alert, unintelligible language. calm, pleasant, interactive with staff. Integumentary (Hair, Skin) no open areas to bilateral groins. no areas of induration or fluctuance. Wound #2 status is Open. Original cause of wound was Gradually Appeared. The wound is located on the Right Groin. The wound measures 0cm length x 0cm width x 0cm depth; 0cm^2 area and 0cm^3 volume. The wound is limited to skin breakdown. There is no tunneling or undermining noted. There is a none present amount of drainage noted. The wound margin is flat and intact. There is no granulation within the wound bed. There is no necrotic tissue within the wound bed. The periwound skin appearance exhibited: Moist. The periwound skin  appearance did not exhibit: Callus, Crepitus, Excoriation, Fluctuance, Friable, Induration, Localized Edema, Rash, Scarring, Dry/Scaly, Maceration, Atrophie Blanche, Cyanosis, Ecchymosis, Hemosiderin Staining, Mottled, Pallor, Rubor, Erythema. Periwound temperature was noted as No Abnormality. patient continues to wear a pull-up adult brief for fecal incontinence, despite having a daily bowel regimen. Discussion was had about options for wicking moisture in the presence of an adult brief. Assessment Active Problems ICD-10 S31.501A - Unspecified open wound of unspecified external genital organs, male, initial encounter Z87.820 - Personal history of traumatic brain injury [name] [name], Stephane ([id number]) [facility] Discharge from Wound Care Center - Treatment complete Follow-Up Appointments: A follow-up appointment should be scheduled. Medication Reconciliation completed and provided to Patient/Care Provider. A Patient Clinical Summary of Care was provided to [facility] 1. Discharge from clinic today 2. Contact the clinic with any future skin related concerns or issues Electronic Signature(s) Signed: 06/27/2016 11:54:15 AM By: Penne Lashoulter, NP, Marygrace Sandoval Entered By: Penne Lashoulter, NP, Harpreet Pompey on 06/27/2016 11:54:14 [name] [name], Aron ([id number]) -------------------------------------------------------------------------------- SuperBill Details Patient Name: [name] [name], Jedd Date of Service: 06/27/2016 Medical Record Number: [id number] Patient Account Number: [id number] Date of Birth/Sex: 10/17/1985 (30 y.o. Male) Treating RN: Huel CoventryWoody, Kim Primary Care Physician: Bethann PunchesMiller, Mark Other Clinician: Referring Physician: Bethann PunchesMiller, Mark Treating Physician/Extender: Kathreen Cosieroulter, Sherryn Pollino Weeks in Treatment: 14 Diagnosis Coding ICD-10 Codes Code Description [phone] Personal history of traumatic brain injury S31.501A Unspecified open wound of unspecified external genital organs, male, initial encounter Facility  Procedures CPT4 Code: [id number] Description: [phone] - WOUND CARE VISIT-LEV 2 EST PT Modifier: Quantity: 1 Physician Procedures CPT4: Description Modifier Quantity Code [id number] - WC PHYS LEVEL 3 - EST PT 1 ICD-10 Description Diagnosis S31.501A Unspecified open wound of unspecified external genital organs, male, initial encounter Electronic Signature(s) Signed: 06/27/2016 11:54:32 AM By: Penne Lashoulter, NP, Averill Pons Entered By: Penne Lashoulter, NP, Ichelle Harral on 06/27/2016 11:54:32

## 2016-07-12 IMAGING — CR DG CHEST 1V PORT
1 series · 1 of 1 positions shown · non-contrast
Comparison: 06/26/2014

CLINICAL DATA: Chest congestion, decreased level of consciousness

EXAM:
PORTABLE CHEST - 1 VIEW

[ap]
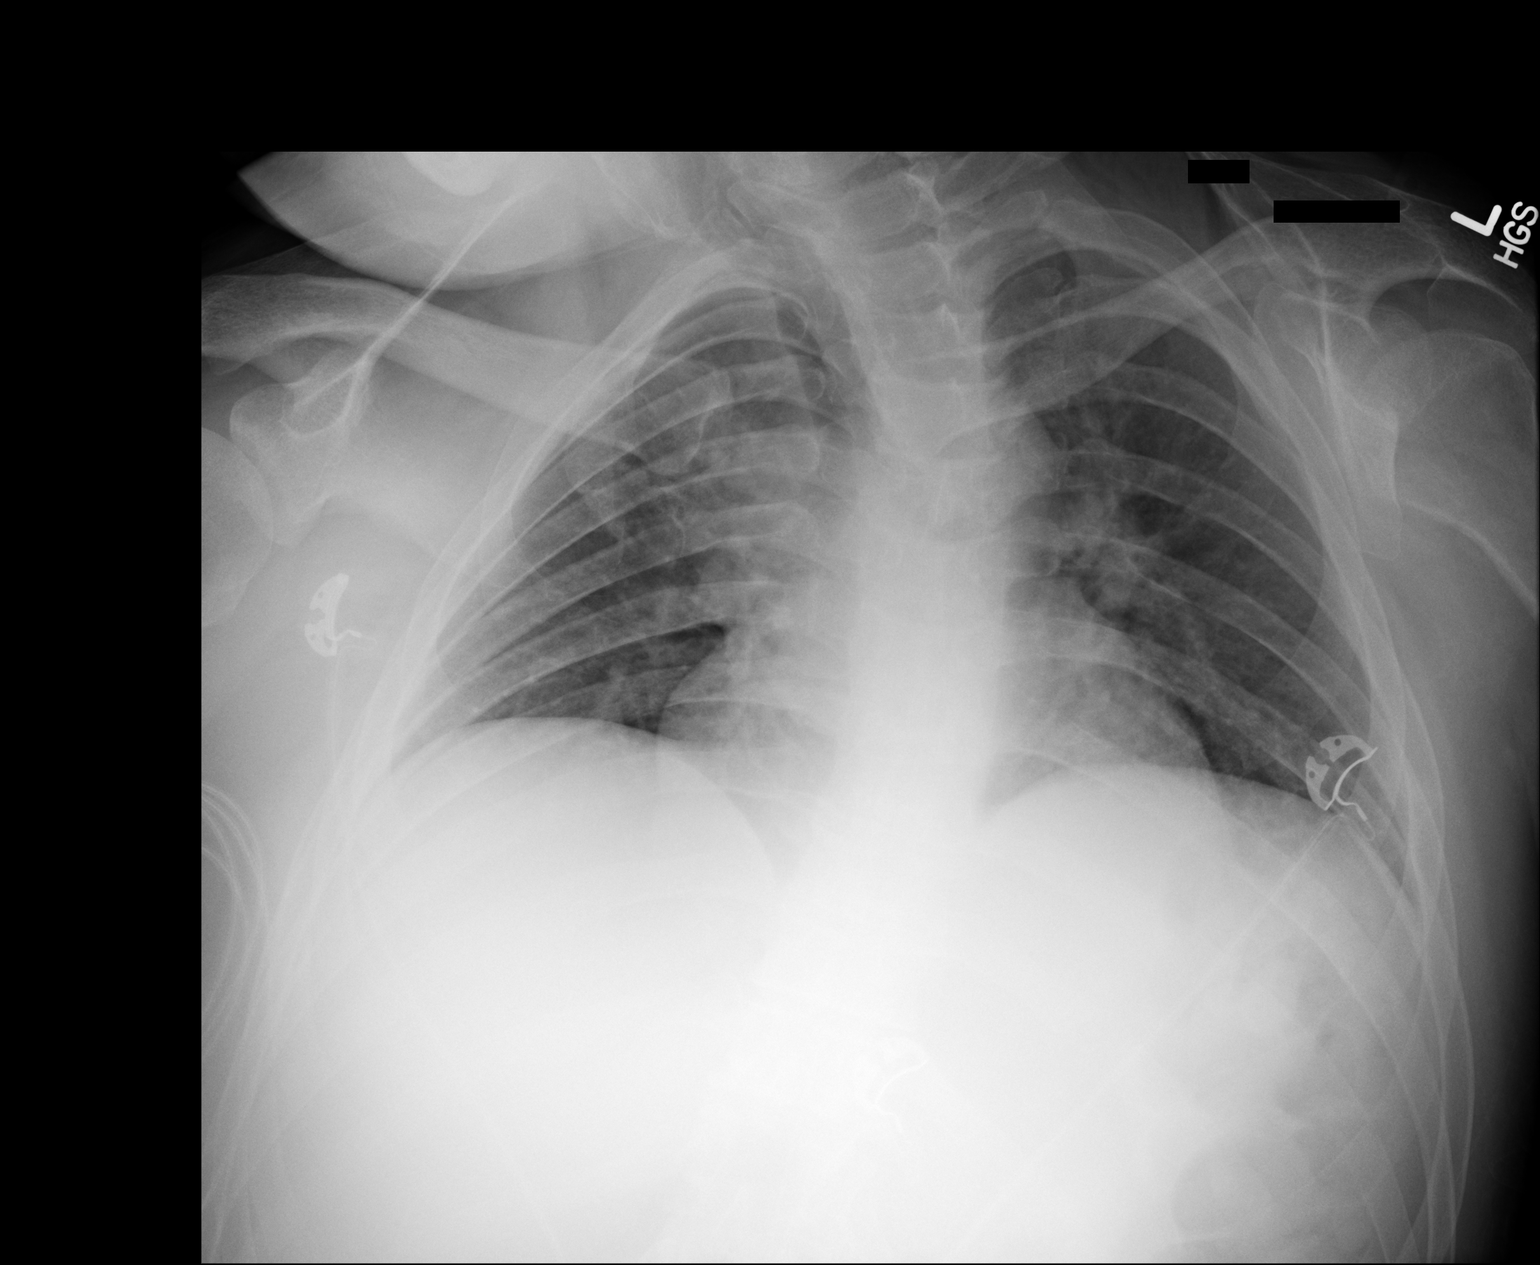

[1 of 1 positions shown; findings below may reference images not displayed]

FINDINGS: Cardiomediastinal silhouette is stable. Study is limited by poor
inspiration with mild basilar atelectasis. No acute infiltrate or
pulmonary edema.
IMPRESSION: Limited study by poor inspiration. Bilateral basilar atelectasis. No
acute infiltrate or pulmonary edema.

## 2016-08-05 ENCOUNTER — Encounter: Payer: BLUE CROSS/BLUE SHIELD | Attending: Surgery | Admitting: Surgery

## 2016-08-05 DIAGNOSIS — G8220 Paraplegia, unspecified: Secondary | ICD-10-CM | POA: Diagnosis not present

## 2016-08-05 DIAGNOSIS — G40909 Epilepsy, unspecified, not intractable, without status epilepticus: Secondary | ICD-10-CM | POA: Diagnosis not present

## 2016-08-05 DIAGNOSIS — R32 Unspecified urinary incontinence: Secondary | ICD-10-CM | POA: Insufficient documentation

## 2016-08-05 DIAGNOSIS — D649 Anemia, unspecified: Secondary | ICD-10-CM | POA: Diagnosis not present

## 2016-08-05 DIAGNOSIS — Z7901 Long term (current) use of anticoagulants: Secondary | ICD-10-CM | POA: Insufficient documentation

## 2016-08-05 DIAGNOSIS — Z86711 Personal history of pulmonary embolism: Secondary | ICD-10-CM | POA: Insufficient documentation

## 2016-08-05 DIAGNOSIS — Z87820 Personal history of traumatic brain injury: Secondary | ICD-10-CM | POA: Diagnosis not present

## 2016-08-05 DIAGNOSIS — B1920 Unspecified viral hepatitis C without hepatic coma: Secondary | ICD-10-CM | POA: Diagnosis not present

## 2016-08-05 DIAGNOSIS — Z79899 Other long term (current) drug therapy: Secondary | ICD-10-CM | POA: Insufficient documentation

## 2016-08-05 DIAGNOSIS — X58XXXD Exposure to other specified factors, subsequent encounter: Secondary | ICD-10-CM | POA: Diagnosis not present

## 2016-08-05 DIAGNOSIS — Z87891 Personal history of nicotine dependence: Secondary | ICD-10-CM | POA: Diagnosis not present

## 2016-08-05 DIAGNOSIS — S31501D Unspecified open wound of unspecified external genital organs, male, subsequent encounter: Secondary | ICD-10-CM | POA: Insufficient documentation

## 2016-08-05 NOTE — Progress Notes (Signed)
[name] [name], [name] [name] ([id number]) Visit Report for 08/05/2016 Abuse/Suicide Risk Screen Details Patient Name: [name] [name], [name] [name] Date of Service: 08/05/2016 9:30 AM Medical Record Number: [id number] Patient Account Number: [id number] Date of Birth/Sex: Dec 22, 1985 (30 y.o. Male) Treating RN: Phillis Haggis Primary Care Physician: Bethann Punches Other Clinician: Referring Physician: Bethann Punches Treating Physician/Extender: Rudene Re in Treatment: 0 Abuse/Suicide Risk Screen Items Answer ABUSE/SUICIDE RISK SCREEN: Has anyone close to you tried to hurt or harm you recentlyo No Do you feel uncomfortable with anyone in your familyo No Has anyone forced you do things that you didnot want to doo No Do you have any thoughts of harming yourselfo No Patient displays signs or symptoms of abuse and/or neglect. No Electronic Signature(s) Signed: 08/05/2016 3:33:30 PM By: Alejandro Mulling Entered By: Alejandro Mulling on 08/05/2016 10:13:15 [name] [name] ([id number]) -------------------------------------------------------------------------------- Activities of Daily Living Details Patient Name: [name] [name] Date of Service: 08/05/2016 9:30 AM Medical Record Number: [id number] Patient Account Number: [id number] Date of Birth/Sex: 01/06/1986 (30 y.o. Male) Treating RN: Phillis Haggis Primary Care Physician: Bethann Punches Other Clinician: Referring Physician: Bethann Punches Treating Physician/Extender: Rudene Re in Treatment: 0 Activities of Daily Living Items Answer Activities of Daily Living (Please select one for each item) Drive Automobile Not Able Take Medications Not Able Use Telephone Not Able Care for Appearance Not Able Use Toilet Not Able Mady Haagensen / Shower Not Able Dress Self Not Able Feed Self Not Able Walk Not Able Get In / Out Bed Not Able Housework Not Able Prepare Meals Not Able Handle Money Not Able Shop for Self Not Able Electronic Signature(s) Signed: 08/05/2016 3:33:30 PM By:  Alejandro Mulling Entered By: Alejandro Mulling on 08/05/2016 10:13:51 [name] [name] ([id number]) -------------------------------------------------------------------------------- Education Assessment Details Patient Name: [name] [name] Date of Service: 08/05/2016 9:30 AM Medical Record Number: [id number] Patient Account Number: [id number] Date of Birth/Sex: 1986-04-23 (30 y.o. Male) Treating RN: Phillis Haggis Primary Care Physician: Bethann Punches Other Clinician: Referring Physician: Bethann Punches Treating Physician/Extender: Rudene Re in Treatment: 0 Primary Learner Assessed: Caregiver Reason Patient is not Primary Learner: pt has hx TBI Learning Preferences/Education Level/Primary Language Learning Preference: Explanation, Printed Material Highest Education Level: College or Above Preferred Language: English Cognitive Barrier Assessment/Beliefs Language Barrier: No Translator Needed: No Memory Deficit: No Emotional Barrier: No Cultural/Religious Beliefs Affecting Medical No Care: Physical Barrier Assessment Impaired Vision: No Impaired Hearing: No Decreased Hand dexterity: No Knowledge/Comprehension Assessment Knowledge Level: High Comprehension Level: High Ability to understand written High instructions: Ability to understand verbal High instructions: Motivation Assessment Anxiety Level: Calm Cooperation: Cooperative Education Importance: Acknowledges Need Interest in Health Problems: Asks Questions Perception: Coherent Willingness to Engage in Self- High Management Activities: Readiness to Engage in Self- High Management Activities: [name] [name], [name] [name] ([id number]) Electronic Signature(s) Signed: 08/05/2016 3:33:30 PM By: Alejandro Mulling Entered By: Alejandro Mulling on 08/05/2016 10:14:36 [name] [name] ([id number]) -------------------------------------------------------------------------------- Fall Risk Assessment Details Patient Name: [name] [name] Date of  Service: 08/05/2016 9:30 AM Medical Record Number: [id number] Patient Account Number: [id number] Date of Birth/Sex: 07-10-1986 (30 y.o. Male) Treating RN: Phillis Haggis Primary Care Physician: Bethann Punches Other Clinician: Referring Physician: Bethann Punches Treating Physician/Extender: Rudene Re in Treatment: 0 Fall Risk Assessment Items Have you had 2 or more falls in the last 12 monthso 0 No Have you had any fall that resulted in injury in the last 12 monthso 0 No FALL RISK ASSESSMENT: History of falling - immediate or within 3 months 0 No Secondary diagnosis 15 Yes Ambulatory aid None/bed rest/wheelchair/nurse 0 Yes Crutches/cane/walker 0  No Furniture 0 No IV Access/Saline Lock 0 No Gait/Training Normal/bed rest/immobile 0 No Weak 0 No Impaired 20 Yes Mental Status Oriented to own ability 0 No Electronic Signature(s) Signed: 08/05/2016 3:33:30 PM By: Alejandro MullingPinkerton, Debra Entered By: Alejandro MullingPinkerton, Debra on 08/05/2016 10:14:48 [name] [name], [name] [name] ([id number]) -------------------------------------------------------------------------------- Foot Assessment Details Patient Name: [name] [name], [name] [name] Date of Service: 08/05/2016 9:30 AM Medical Record Number: [id number] Patient Account Number: [id number] Date of Birth/Sex: 10/30/1985 (30 y.o. Male) Treating RN: Phillis HaggisPinkerton, Debi Primary Care Physician: Bethann PunchesMiller, Mark Other Clinician: Referring Physician: Bethann PunchesMiller, Mark Treating Physician/Extender: Rudene ReBritto, Errol Weeks in Treatment: 0 Foot Assessment Items Site Locations + = Sensation present, - = Sensation absent, C = Callus, U = Ulcer R = Redness, W = Warmth, M = Maceration, PU = Pre-ulcerative lesion F = Fissure, S = Swelling, D = Dryness Assessment Right: Left: Other Deformity: No No Prior Foot Ulcer: No No Prior Amputation: No No Charcot Joint: No No Ambulatory Status: Gait: Electronic Signature(s) Signed: 08/05/2016 3:33:30 PM By: Alejandro MullingPinkerton, Debra Entered By: Alejandro MullingPinkerton, Debra on 08/05/2016  10:15:16 [name] [name], [name] [name] ([id number]) -------------------------------------------------------------------------------- Nutrition Risk Assessment Details Patient Name: [name] [name], [name] [name] Date of Service: 08/05/2016 9:30 AM Medical Record Number: [id number] Patient Account Number: [id number] Date of Birth/Sex: 12/03/1985 (30 y.o. Male) Treating RN: Phillis HaggisPinkerton, Debi Primary Care Physician: Bethann PunchesMiller, Mark Other Clinician: Referring Physician: Bethann PunchesMiller, Mark Treating Physician/Extender: Rudene ReBritto, Errol Weeks in Treatment: 0 Height (in): 72 Weight (lbs): 230 Body Mass Index (BMI): 31.2 Nutrition Risk Assessment Items NUTRITION RISK SCREEN: I have an illness or condition that made me change the kind and/or 2 Yes amount of food I eat I eat fewer than two meals per day 0 No I eat few fruits and vegetables, or milk products 0 No I have three or more drinks of beer, liquor or wine almost every day 0 No I have tooth or mouth problems that make it hard for me to eat 0 No I don't always have enough money to buy the food I need 0 No I eat alone most of the time 0 No I take three or more different prescribed or over-the-counter drugs a 1 Yes day Without wanting to, I have lost or gained 10 pounds in the last six 0 No months I am not always physically able to shop, cook and/or feed myself 2 Yes Nutrition Protocols Good Risk Protocol Moderate Risk Protocol Electronic Signature(s) Signed: 08/05/2016 3:33:30 PM By: Alejandro MullingPinkerton, Debra Entered By: Alejandro MullingPinkerton, Debra on 08/05/2016 10:15:08

## 2016-08-05 NOTE — Progress Notes (Signed)
[name] [name] ([id number]) Visit Report for 08/05/2016 Allergy List Details Patient Name: [name] [name], [name] [name] Date of Service: 08/05/2016 9:30 AM Medical Record Number: [id number] Patient Account Number: [id number] Date of Birth/Sex: 1985/08/11 (30 y.o. Male) Treating RN: [name] [name] Primary Care Physician: [name] [name] Other Clinician: Referring Physician: Emily [name] Treating Physician/Extender: [name] [name] in Treatment: 0 Allergies Active Allergies Ambien ativan Depakote dilauded Keppra Allergy Notes Electronic Signature(s) Signed: 08/05/2016 3:33:30 PM By: [name] [name] Entered By: [name] [name] on 08/05/2016 10:09:36 [name] [name] ([id number]) -------------------------------------------------------------------------------- Arrival Information Details Patient Name: [name] [name] Date of Service: 08/05/2016 9:30 AM Medical Record Number: [id number] Patient Account Number: [id number] Date of Birth/Sex: 02/15/1986 (30 y.o. Male) Treating RN: [name] [name] Primary Care Physician: [name] [name] Other Clinician: Referring Physician: Emily [name] Treating Physician/Extender: [name] [name] in Treatment: 0 Visit Information Patient Arrived: Wheel Chair Arrival Time: 09:54 Accompanied By: cargiver Transfer Assistance: Hoyer Lift Patient Identification Verified: Yes Secondary Verification Process Yes Completed: Patient Requires Transmission-Based No Precautions: Patient Has Alerts: No History Since Last Visit All ordered tests and consults were completed: No Added or deleted any medications: No Any new allergies or adverse reactions: No Had a fall or experienced change in activities of daily living that may affect risk of falls: No Signs or symptoms of abuse/neglect since last visito No Hospitalized since last visit: No Electronic Signature(s) Signed: 08/05/2016 3:33:30 PM By: [name] [name] Entered By: [name] [name] on 08/05/2016 10:07:25 [name] [name]  ([id number]) -------------------------------------------------------------------------------- Clinic Level of Care Assessment Details Patient Name: [name] [name] Date of Service: 08/05/2016 9:30 AM Medical Record Number: [id number] Patient Account Number: [id number] Date of Birth/Sex: [birth year] (30 y.o. Male) Treating RN: [name] [name] Primary Care Physician: [name] [name] Other Clinician: Referring Physician: Emily [name] Treating Physician/Extender: [name] [name] in Treatment: 0 Clinic Level of Care Assessment Items TOOL 2 Quantity Score X - Use when only an EandM is performed on the INITIAL visit 1 0 ASSESSMENTS - Nursing Assessment / Reassessment X - General Physical Exam (combine w/ comprehensive assessment (listed just 1 20 below) when performed on new pt. evals) X - Comprehensive Assessment (HX, ROS, Risk Assessments, Wounds Hx, etc.) 1 25 ASSESSMENTS - Wound and Skin Assessment / Reassessment X - Simple Wound Assessment / Reassessment - one wound 1 5 _0  - Complex Wound Assessment / Reassessment - multiple wounds 0 _1  - Dermatologic / Skin Assessment (not related to wound area) 0 ASSESSMENTS - Ostomy and/or Continence Assessment and Care _2  - Incontinence Assessment and Management 0 _3  - Ostomy Care Assessment and Management (repouching, etc.) 0 PROCESS - Coordination of Care _4  - Simple Patient / Family Education for ongoing care 0 X - Complex (extensive) Patient / Family Education for ongoing care 1 20 X - Staff obtains Programmer, systems, Records, Test Results / Process Orders 1 10 _5  - Staff telephones HHA, Nursing Homes / Clarify orders / etc 0 _6  - Routine Transfer to another Facility (non-emergent condition) 0 _7  - Routine Hospital Admission (non-emergent condition) 0 X - New Admissions / Biomedical engineer / Ordering NPWT, Apligraf, etc. 1 15 _8  - Emergency Hospital Admission (emergent condition) 0 X - Simple Discharge Coordination 1 10 [name], [name] ([id number]) _9   - Complex (extensive) Discharge Coordination 0 PROCESS - Special Needs _10  - Pediatric / Minor Patient Management 0 _11  - Isolation Patient Management 0 _12  - Hearing / Language / Visual special needs 0 _13  - Assessment of Community assistance (transportation, D/C planning, etc.) 0 _14  - Additional assistance / Altered mentation 0 _15  - Support  Surface(s) Assessment (bed, cushion, seat, etc.) 0 INTERVENTIONS - Wound Cleansing / Measurement X - Wound Imaging (photographs - any number of wounds) 1 5 _0  - Wound Tracing (instead of photographs) 0 X - Simple Wound Measurement - one wound 1 5 _1  - Complex Wound Measurement - multiple wounds 0 X - Simple Wound Cleansing - one wound 1 5 _2  - Complex Wound Cleansing - multiple wounds 0 INTERVENTIONS - Wound Dressings X - Small Wound Dressing one or multiple wounds 1 10 _3  - Medium Wound Dressing one or multiple wounds 0 _4  - Large Wound Dressing one or multiple wounds 0 <[id number]>_9<\/[id number]>_4  - Application of Medications - injection 0 INTERVENTIONS - Miscellaneous _6  - External ear exam 0 _7  - Specimen Collection (cultures, biopsies, blood, body fluids, etc.) 0 _8  - Specimen(s) / Culture(s) sent or taken to Lab for analysis 0 X - Patient Transfer (multiple staff / Civil Service fast streamer / Similar devices) 1 10 _9  - Simple Staple / Suture removal (25 or less) 0 _10  - Complex Staple / Suture removal (26 or more) 0 Kelliher, Greene ([id number]) _11  - Hypo / Hyperglycemic Management (close monitor of Blood Glucose) 0 _12  - Ankle / Brachial Index (ABI) - do not check if billed separately 0 Has the patient been seen at the hospital within the last three years: Yes Total Score: 140 Level Of Care: New/Established - Level 4 Electronic Signature(s) Signed: 08/05/2016 3:33:30 PM By: [name] [name] Entered By: [name] [name] on 08/05/2016 11:47:19 [name] [name] ([id number]) -------------------------------------------------------------------------------- Encounter Discharge Information  Details Patient Name: [name] [name] Date of Service: 08/05/2016 9:30 AM Medical Record Number: [id number] Patient Account Number: [id number] Date of Birth/Sex: 06/03/1986 (30 y.o. Male) Treating RN: [name] [name] Primary Care Physician: [name] [name] Other Clinician: Referring Physician: Emily [name] Treating Physician/Extender: [name] [name] in Treatment: 0 Encounter Discharge Information Items Discharge Pain Level: 0 Discharge Condition: Stable Ambulatory Status: Wheelchair Discharge Destination: Home Transportation: Private Auto Accompanied By: caregiver Schedule Follow-up Appointment: Yes Medication Reconciliation completed No and provided to Patient/Care Jyla Hopf: Provided on Clinical Summary of Care: 08/05/2016 Form Type Recipient Paper Patient ZI Electronic Signature(s) Signed: 08/05/2016 10:45:32 AM By: Ruthine Dose Entered By: Ruthine Dose on 08/05/2016 10:45:32 [name] [name] ([id number]) -------------------------------------------------------------------------------- Lower Extremity Assessment Details Patient Name: [name] [name] Date of Service: 08/05/2016 9:30 AM Medical Record Number: [id number] Patient Account Number: [id number] Date of Birth/Sex: 11-21-1985 (30 y.o. Male) Treating RN: [name] [name] Primary Care Physician: [name] [name] Other Clinician: Referring Physician: Emily [name] Treating Physician/Extender: [name] [name] in Treatment: 0 Electronic Signature(s) Signed: 08/05/2016 3:33:30 PM By: [name] [name] Entered By: [name] [name] on 08/05/2016 10:09:04 [name] [name] ([id number]) -------------------------------------------------------------------------------- Multi Wound Chart Details Patient Name: [name] [name] Date of Service: 08/05/2016 9:30 AM Medical Record Number: [id number] Patient Account Number: [id number] Date of Birth/Sex: 1985-08-31 (30 y.o. Male) Treating RN: [name] [name] Primary Care Physician: [name] [name] Other  Clinician: Referring Physician: Emily [name] Treating Physician/Extender: [name] [name] in Treatment: 0 Vital Signs Height(in): 72 Pulse(bpm): 73 Weight(lbs): 230 Blood Pressure 128/68 (mmHg): Body Mass Index(BMI): 31 Temperature(F): 98.0 Respiratory Rate 16 (breaths/min): Photos: [4:No Photos] [N/A:N/A] Wound Location: [4:Right Groin] [N/A:N/A] Wounding Event: [4:Gradually Appeared] [N/A:N/A] Primary Etiology: [4:To be determined] [N/A:N/A] Comorbid History: [4:Anemia, Hepatitis C, History of pressure wounds, Paraplegia, Seizure Disorder] [N/A:N/A] Date Acquired: [4:07/22/2016] [N/A:N/A] Weeks of Treatment: [4:0] [N/A:N/A] Wound Status: [4:Open] [N/A:N/A] Measurements L x W x D 0.6x0.4x0.1 [N/A:N/A] (cm) Area (cm) : [4:0.188] [N/A:N/A] Volume (cm) : [4:0.019] [N/A:N/A] Classification: [4:Partial Thickness] [N/A:N/A] Exudate Amount: [4:Large] [N/A:N/A] Exudate  Type: [4:Serosanguineous] [N/A:N/A] Exudate Color: [4:red, brown] [N/A:N/A] Wound Margin: [4:Distinct, outline attached] [N/A:N/A] Granulation Amount: [4:Large (67-100%)] [N/A:N/A] Granulation Quality: [4:Red] [N/A:N/A] Necrotic Amount: [4:None Present (0%)] [N/A:N/A] Exposed Structures: [4:Fascia: No Fat: No Tendon: No Muscle: No Joint: No Bone: No] [N/A:N/A] Limited to Skin Breakdown Epithelialization: None N/A N/A Periwound Skin Texture: No Abnormalities Noted N/A N/A Periwound Skin Moist: Yes N/A N/A Moisture: Periwound Skin Color: No Abnormalities Noted N/A N/A Temperature: No Abnormality N/A N/A Tenderness on Yes N/A N/A Palpation: Wound Preparation: Ulcer Cleansing: N/A N/A Rinsed/Irrigated with Saline Topical Anesthetic Applied: Other: lidocaine 4% Treatment Notes Electronic Signature(s) Signed: 08/05/2016 10:40:05 AM By: Christin Fudge MD, FACS Entered By: Christin Fudge on 08/05/2016 10:40:05 [name] [name]  ([id number]) -------------------------------------------------------------------------------- [place] Details Patient Name: [name] [name] Date of Service: 08/05/2016 9:30 AM Medical Record Number: [id number] Patient Account Number: [id number] Date of Birth/Sex: 1986/06/14 (30 y.o. Male) Treating RN: [name] [name] Primary Care Physician: [name] [name] Other Clinician: Referring Physician: Emily [name] Treating Physician/Extender: [name] [name] in Treatment: 0 Active Inactive Abuse / Safety / Falls / Self Care Management Nursing Diagnoses: Potential for falls Goals: Patient will remain injury free Date Initiated: 08/05/2016 Goal Status: Active Interventions: Assess fall risk on admission and as needed Notes: Orientation to the Wound Care Program Nursing Diagnoses: Knowledge deficit related to the wound healing center program Goals: Patient/caregiver will verbalize understanding of the [facility] Date Initiated: 08/05/2016 Goal Status: Active Interventions: Provide education on orientation to the wound center Notes: Pain, Acute or Chronic Nursing Diagnoses: Pain, acute or chronic: actual or potential Potential alteration in comfort, pain GoalsHYKEEM, OJEDA ([id number]) Patient will verbalize adequate pain control and receive pain control interventions during procedures as needed Date Initiated: 08/05/2016 Goal Status: Active Patient/caregiver will verbalize adequate pain control between visits Date Initiated: 08/05/2016 Goal Status: Active Patient/caregiver will verbalize comfort level met Date Initiated: 08/05/2016 Goal Status: Active Interventions: Assess comfort goal upon admission Notes: Wound/Skin Impairment Nursing Diagnoses: Impaired tissue integrity Knowledge deficit related to ulceration/compromised skin integrity Goals: Ulcer/skin breakdown will have a volume reduction of 30% by week 4 Date Initiated: 08/05/2016 Goal  Status: Active Ulcer/skin breakdown will have a volume reduction of 50% by week 8 Date Initiated: 08/05/2016 Goal Status: Active Ulcer/skin breakdown will have a volume reduction of 80% by week 12 Date Initiated: 08/05/2016 Goal Status: Active Interventions: Assess patient/caregiver ability to obtain necessary supplies Assess patient/caregiver ability to perform ulcer/skin care regimen upon admission and as needed Notes: Electronic Signature(s) Signed: 08/05/2016 3:33:30 PM By: [name] [name] Entered By: [name] [name] on 08/05/2016 10:26:57 [name] [name] ([id number]) -------------------------------------------------------------------------------- Pain Assessment Details Patient Name: [name] [name] Date of Service: 08/05/2016 9:30 AM Medical Record Number: [id number] Patient Account Number: [id number] Date of Birth/Sex: May 20, 1986 (30 y.o. Male) Treating RN: [name] [name] Primary Care Physician: [name] [name] Other Clinician: Referring Physician: Emily [name] Treating Physician/Extender: [name] [name] in Treatment: 0 Active Problems Location of Pain Severity and Description of Pain Patient Has Paino No Site Locations With Dressing Change: No Pain Management and Medication Current Pain Management: Electronic Signature(s) Signed: 08/05/2016 3:33:30 PM By: [name] [name] Entered By: [name] [name] on 08/05/2016 10:07:58 [name] [name] ([id number]) -------------------------------------------------------------------------------- Patient/Caregiver Education Details Patient Name: [name] [name] Date of Service: 08/05/2016 9:30 AM Medical Record Number: [id number] Patient Account Number: [id number] Date of Birth/Gender: 1985/11/25 (30 y.o. Male) Treating RN: [name] [name] Primary Care Physician: [name] [name] Other Clinician: Referring Physician: Emily [name] Treating Physician/Extender: [name] [name] in Treatment: 0 Education Assessment Education  Provided  To: Patient Education Topics Provided Welcome To [facility]: Handouts: Welcome To [facility] Methods: Explain/Verbal Responses: State content correctly Wound/Skin Impairment: Handouts: Other: change dressing as ordered Methods: Demonstration, Explain/Verbal Responses: State content correctly Electronic Signature(s) Signed: 08/05/2016 3:33:30 PM By: [name] [name] Entered By: [name] [name] on 08/05/2016 10:42:27 [name] [name] ([id number]) -------------------------------------------------------------------------------- Wound Assessment Details Patient Name: [name] [name] Date of Service: 08/05/2016 9:30 AM Medical Record Number: [id number] Patient Account Number: [id number] Date of Birth/Sex: Jul 02, 1986 (30 y.o. Male) Treating RN: [name] [name] Primary Care Physician: [name] [name] Other Clinician: Referring Physician: Emily [name] Treating Physician/Extender: [name] [name] in Treatment: 0 Wound Status Wound Number: 4 Primary To be determined Etiology: Wound Location: Right Groin Wound Open Wounding Event: Gradually Appeared Status: Date Acquired: 07/22/2016 Comorbid Anemia, Hepatitis C, History of Weeks Of Treatment: 0 History: pressure wounds, Paraplegia, Seizure Clustered Wound: No Disorder Photos Photo Uploaded By: [name] [name] on 08/05/2016 15:20:07 Wound Measurements Length: (cm) 0.6 Width: (cm) 0.4 Depth: (cm) 0.1 Area: (cm) 0.188 Volume: (cm) 0.019 % Reduction in Area: % Reduction in Volume: Epithelialization: None Tunneling: No Undermining: No Wound Description Classification: Partial Thickness Wound Margin: Distinct, outline attached Exudate Amount: Large Exudate Type: Serosanguineous Exudate Color: red, brown Foul Odor After Cleansing: No Wound Bed Granulation Amount: Large (67-100%) Exposed Structure Granulation Quality: Red Fascia Exposed: No Necrotic Amount: None Present (0%) Fat Layer Exposed: No Tendon  Exposed: No Pangallo, Aj ([id number]) Muscle Exposed: No Joint Exposed: No Bone Exposed: No Limited to Skin Breakdown Periwound Skin Texture Texture Color No Abnormalities Noted: No No Abnormalities Noted: No Moisture Temperature / Pain No Abnormalities Noted: No Temperature: No Abnormality Moist: Yes Tenderness on Palpation: Yes Wound Preparation Ulcer Cleansing: Rinsed/Irrigated with Saline Topical Anesthetic Applied: Other: lidocaine 4%, Treatment Notes Wound #4 (Right Groin) 1. Cleansed with: Clean wound with Normal Saline 2. Anesthetic Topical Lidocaine 4% cream to wound bed prior to debridement 3. Peri-wound Care: Antifungal powder Notes drawtex Electronic Signature(s) Signed: 08/05/2016 3:33:30 PM By: [name] [name] Entered By: [name] [name] on 08/05/2016 10:22:24 [name] [name] ([id number]) -------------------------------------------------------------------------------- Vitals Details Patient Name: [name] [name] Date of Service: 08/05/2016 9:30 AM Medical Record Number: [id number] Patient Account Number: [id number] Date of Birth/Sex: 1986/01/24 (30 y.o. Male) Treating RN: [name] [name] Primary Care Physician: [name] [name] Other Clinician: Referring Physician: Emily [name] Treating Physician/Extender: [name] [name] in Treatment: 0 Vital Signs Time Taken: 10:08 Temperature (F): 98.0 Height (in): 72 Pulse (bpm): 73 Source: Stated Respiratory Rate (breaths/min): 16 Weight (lbs): 230 Blood Pressure (mmHg): 128/68 Source: Stated Reference Range: 80 - 120 mg / dl Body Mass Index (BMI): 31.2 Electronic Signature(s) Signed: 08/05/2016 3:33:30 PM By: [name] [name] Entered By: [name] [name] on 08/05/2016 10:08:50

## 2016-08-05 NOTE — Progress Notes (Signed)
[name] [name], [name] [name] ([id number]) Visit Report for 08/05/2016 Chief Complaint Document Details Patient Name: [name] [name], [name] [name] Date of Service: 08/05/2016 9:30 AM Medical Record Number: [id number] Patient Account Number: [id number] Date of Birth/Sex: 09-27-1985 (30 y.o. Male) Treating RN: Phillis Haggis Primary Care Physician: Bethann Punches Other Clinician: Referring Physician: Bethann Punches Treating Physician/Extender: Rudene Re in Treatment: 0 Information Obtained from: Caregiver Chief Complaint he returns with an ulceration to his right groin which has been fairly superficial for about couple of weeks. Electronic Signature(s) Signed: 08/05/2016 10:40:42 AM By: Evlyn Kanner MD, FACS Entered By: Evlyn Kanner on 08/05/2016 10:40:42 [name] [name] ([id number]) -------------------------------------------------------------------------------- HPI Details Patient Name: [name] [name] Date of Service: 08/05/2016 9:30 AM Medical Record Number: [id number] Patient Account Number: [id number] Date of Birth/Sex: [birth year] (30 y.o. Male) Treating RN: Phillis Haggis Primary Care Physician: Bethann Punches Other Clinician: Referring Physician: Bethann Punches Treating Physician/Extender: Rudene Re in Treatment: 0 History of Present Illness Location: right groin Quality: Patient reports No Pain. Severity: Patient states wound are getting worse. Duration: Patient has had the wound for > 1 months prior to seeking treatment at the wound center Context: The wound appeared gradually over time HPI Description: 31 year old patient who has traumatic brain injury was recently healed at the end of November 2017 for chronic right and left groin wounds. He continue conservative therapy but recently they noticed that deep down in his right groin and perineum he has a slight open superficial ulceration. Nothing else has changed in his health except that he has got a new wheelchair. Old notes: 31 year old male who  has a history of traumatic brain injury and also has a ulcer on his left heel which was noted by the family about 4-5 months ago. The TBI was in January 2013 and prior to this visit the patient has had hyperbaric oxygen therapy approximately 2 years after his TBI. He is had several gastrostomy tube placed by the gastroenterologist but then finally had a surgical procedure to bring the gastric mucosa to the skin surface. This was done approximately 9 months ago. Past medical history of hepatitis C, traumatic brain injury, pulmonary emboli, ileostomy, tracheostomy, PEG tube placement. Was a former smoker and quit in January 2013 He known to see [place] for various issues with wounds and this time around for about a month he has open areas near his groin possibly due to the contracture. No history of fungal infection of the groin. 04/04/2016 -- his caregiver says there is a lot of drainage and she has to often change it more than once a day because of the amount of drainage in this area. May 16 2016 -- the caregiver says that is continuous drainage which increases the redness and then he has multiple small linear superficial ulcerations. 06-27-16 [name] [name] presents today accompanied by his caregiver for follow-up. The caregiver denies any issues or changes since the last appointment and admits to using nystatin powder to bilateral groins at bedtime and wicking moisture during the day with drawtex. The caregiver also admits to attempts and dietary changes in an effort to reduce caloric intake and achieve weight loss. Electronic Signature(s) Signed: 08/05/2016 10:42:11 AM By: Evlyn Kanner MD, FACS Entered By: Evlyn Kanner on 08/05/2016 10:42:11 [name] [name] ([id number]) -------------------------------------------------------------------------------- Physical Exam Details Patient Name: [name] [name] Date of Service: 08/05/2016 9:30 AM Medical Record Number: [id number] Patient Account Number:  [id number] Date of Birth/Sex: 07-16-1986 (30 y.o. Male) Treating RN: Phillis Haggis Primary Care Physician: Bethann Punches Other Clinician: Referring Physician: Bethann Punches Treating  Physician/Extender: Evlyn Kanner Weeks in Treatment: 0 Constitutional . Pulse regular. Respirations normal and unlabored. Afebrile. . Eyes Nonicteric. Reactive to light. Ears, Nose, Mouth, and Throat Lips, teeth, and gums WNL.Marland Kitchen Moist mucosa without lesions. Neck supple and nontender. No palpable supraclavicular or cervical adenopathy. Normal sized without goiter. Respiratory WNL. No retractions.. Breath sounds WNL, No rubs, rales, rhonchi, or wheeze.. Cardiovascular Heart rhythm and rate regular, no murmur or gallop.. Pedal Pulses WNL. No clubbing, cyanosis or edema. Chest Breasts symmetical and no nipple discharge.. Breast tissue WNL, no masses, lumps, or tenderness.. Lymphatic No adneopathy. No adenopathy. No adenopathy. Musculoskeletal Adexa without tenderness or enlargement.. Digits and nails w/o clubbing, cyanosis, infection, petechiae, ischemia, or inflammatory conditions.. Integumentary (Hair, Skin) No suspicious lesions. No crepitus or fluctuance. No peri-wound warmth or erythema. No masses.Marland Kitchen Psychiatric Judgement and insight Intact.. No evidence of depression, anxiety, or agitation.. Notes In the region of the right groin and perineum he's got a superficial ulceration surrounded by minimal fungal infection. There is no drainage or cellulitis. Electronic Signature(s) Signed: 08/05/2016 10:42:41 AM By: Evlyn Kanner MD, FACS Entered By: Evlyn Kanner on 08/05/2016 10:42:40 [name] [name] ([id number]) -------------------------------------------------------------------------------- Physician Orders Details Patient Name: [name] [name] Date of Service: 08/05/2016 9:30 AM Medical Record Number: [id number] Patient Account Number: [id number] Date of Birth/Sex: Mar 12, 1986 (30 y.o. Male) Treating RN:  Phillis Haggis Primary Care Physician: Bethann Punches Other Clinician: Referring Physician: Bethann Punches Treating Physician/Extender: Rudene Re in Treatment: 0 Verbal / Phone Orders: Yes Clinician: Pinkerton, Debi Read Back and Verified: Yes Diagnosis Coding Wound Cleansing Wound #4 Right Groin o Clean wound with Normal Saline. o Cleanse wound with mild soap and water o May Shower, gently pat wound dry prior to applying new dressing. Anesthetic Wound #4 Right Groin o Topical Lidocaine 4% cream applied to wound bed prior to debridement Skin Barriers/Peri-Wound Care Wound #4 Right Groin o Antifungal powder-Nystatin Primary Wound Dressing Wound #4 Right Groin o Other: - nystatin Secondary Dressing Wound #4 Right Groin o Drawtex Dressing Change Frequency Wound #4 Right Groin o Change dressing twice daily. Follow-up Appointments Wound #4 Right Groin o Return Appointment in 2 weeks. Off-Loading Wound #4 Right Groin o Turn and reposition every 2 hours [name] [name], [name] [name] ([id number]) Additional Orders / Instructions Wound #4 Right Groin o Stop Smoking Electronic Signature(s) Signed: 08/05/2016 2:05:36 PM By: Evlyn Kanner MD, FACS Signed: 08/05/2016 3:33:30 PM By: Alejandro Mulling Entered By: Alejandro Mulling on 08/05/2016 10:29:06 [name] [name] ([id number]) -------------------------------------------------------------------------------- Problem List Details Patient Name: [name] [name] Date of Service: 08/05/2016 9:30 AM Medical Record Number: [id number] Patient Account Number: [id number] Date of Birth/Sex: 01/18/1986 (30 y.o. Male) Treating RN: Phillis Haggis Primary Care Physician: Bethann Punches Other Clinician: Referring Physician: Bethann Punches Treating Physician/Extender: Rudene Re in Treatment: 0 Active Problems ICD-10 Encounter Code Description Active Date Diagnosis S31.501D Unspecified open wound of unspecified external genital  08/05/2016 Yes organs, male, subsequent encounter Z87.820 Personal history of traumatic brain injury 08/05/2016 Yes Inactive Problems Resolved Problems Electronic Signature(s) Signed: 08/05/2016 10:39:54 AM By: Evlyn Kanner MD, FACS Entered By: Evlyn Kanner on 08/05/2016 10:39:54 [name] [name] ([id number]) -------------------------------------------------------------------------------- Progress Note Details Patient Name: [name] [name] Date of Service: 08/05/2016 9:30 AM Medical Record Number: [id number] Patient Account Number: [id number] Date of Birth/Sex: 1985/11/16 (30 y.o. Male) Treating RN: Phillis Haggis Primary Care Physician: Bethann Punches Other Clinician: Referring Physician: Bethann Punches Treating Physician/Extender: Rudene Re in Treatment: 0 Subjective Chief Complaint Information obtained from Caregiver he returns with an ulceration to his right groin which  has been fairly superficial for about couple of weeks. History of Present Illness (HPI) The following HPI elements were documented for the patient's wound: Location: right groin Quality: Patient reports No Pain. Severity: Patient states wound are getting worse. Duration: Patient has had the wound for > 1 months prior to seeking treatment at the wound center Context: The wound appeared gradually over time 31 year old patient who has traumatic brain injury was recently healed at the end of November 2017 for chronic right and left groin wounds. He continue conservative therapy but recently they noticed that deep down in his right groin and perineum he has a slight open superficial ulceration. Nothing else has changed in his health except that he has got a new wheelchair. Old notes: 31 year old male who has a history of traumatic brain injury and also has a ulcer on his left heel which was noted by the family about 4-5 months ago. The TBI was in January 2013 and prior to this visit the patient has had hyperbaric oxygen  therapy approximately 2 years after his TBI. He is had several gastrostomy tube placed by the gastroenterologist but then finally had a surgical procedure to bring the gastric mucosa to the skin surface. This was done approximately 9 months ago. Past medical history of hepatitis C, traumatic brain injury, pulmonary emboli, ileostomy, tracheostomy, PEG tube placement. Was a former smoker and quit in January 2013 He known to see [place] for various issues with wounds and this time around for about a month he has open areas near his groin possibly due to the contracture. No history of fungal infection of the groin. 04/04/2016 -- his caregiver says there is a lot of drainage and she has to often change it more than once a day because of the amount of drainage in this area. May 16 2016 -- the caregiver says that is continuous drainage which increases the redness and then he has multiple small linear superficial ulcerations. 06-27-16 [name] [name] presents today accompanied by his caregiver for follow-up. The caregiver denies any issues or changes since the last appointment and admits to using nystatin powder to bilateral groins at bedtime and wicking moisture during the day with drawtex. The caregiver also admits to attempts and Herbert, Earna Coder ([id number]) dietary changes in an effort to reduce caloric intake and achieve weight loss. Wound History Patient presents with 1 open wound that has been present for approximately 2 weeks. Patient has been treating wound in the following manner: nystatin. Laboratory tests have not been performed in the last month. Patient reportedly has not tested positive for an antibiotic resistant organism. Patient reportedly has not tested positive for osteomyelitis. Patient reportedly has not had testing performed to evaluate circulation in the legs. Patient History Information obtained from Patient. Allergies Ambien, ativan, Depakote, dilauded, Keppra Family  History Hypertension - Mother, No family history of Cancer, Diabetes, Heart Disease, Hereditary Spherocytosis, Kidney Disease, Lung Disease, Seizures, Stroke, Thyroid Problems, Tuberculosis. Social History Former smoker, Marital Status - Single, Alcohol Use - Never, Drug Use - No History, Caffeine Use - Never. Medical History Hematologic/Lymphatic Patient has history of Anemia Medical And Surgical History Notes Respiratory lung tube, trachesotomy, Genitourinary foley Review of Systems (ROS) Constitutional Symptoms (General Health) The patient has no complaints or symptoms. Ear/Nose/Mouth/Throat The patient has no complaints or symptoms. Respiratory hx pulmonary embolism lung disease Cardiovascular The patient has no complaints or symptoms. Gastrointestinal incontinent Endocrine The patient has no complaints or symptoms. Genitourinary foley in place Immunological [name] [name], [name] [name] ([id number]) The patient  has no complaints or symptoms. Integumentary (Skin) Denies complaints or symptoms of Wounds. Musculoskeletal The patient has no complaints or symptoms. Neurologic TBI Oncologic The patient has no complaints or symptoms. Psychiatric The patient has no complaints or symptoms. Medications gabapentin 300 mg capsule oral capsule oral Vimpat 200 mg tablet oral tablet oral albuterol sulfate 2.5 mg/3 mL (0.083 %) solution for nebulization inhalation solution for nebulization inhalation propranolol ER 80 mg capsule,24 hr,extended release oral capsule,extended release 24 hr oral Peridex 0.12 % mouthwash mucous membrane mouthwash mucous membrane Eliquis 5 mg tablet oral 2 2 tablet oral multivitamin tablet oral tablet oral ibuprofen 600 mg tablet oral tablet oral indomethacin ER 75 mg capsule,extended release oral capsule, extended release oral baclofen 20 mg tablet oral tablet oral mupirocin 2 % topical ointment topical ointment topical nystatin 100,000 unit/gram topical powder  topical powder topical Objective Constitutional Pulse regular. Respirations normal and unlabored. Afebrile. Vitals Time Taken: 10:08 AM, Height: 72 in, Source: Stated, Weight: 230 lbs, Source: Stated, BMI: 31.2, Temperature: 98.0 F, Pulse: 73 bpm, Respiratory Rate: 16 breaths/min, Blood Pressure: 128/68 mmHg. Eyes Nonicteric. Reactive to light. Ears, Nose, Mouth, and Throat Lips, teeth, and gums WNL.Marland Kitchen Moist mucosa without lesions. [name] [name], [name] [name] ([id number]) Neck supple and nontender. No palpable supraclavicular or cervical adenopathy. Normal sized without goiter. Respiratory WNL. No retractions.. Breath sounds WNL, No rubs, rales, rhonchi, or wheeze.. Cardiovascular Heart rhythm and rate regular, no murmur or gallop.. Pedal Pulses WNL. No clubbing, cyanosis or edema. Chest Breasts symmetical and no nipple discharge.. Breast tissue WNL, no masses, lumps, or tenderness.. Lymphatic No adneopathy. No adenopathy. No adenopathy. Musculoskeletal Adexa without tenderness or enlargement.. Digits and nails w/o clubbing, cyanosis, infection, petechiae, ischemia, or inflammatory conditions.Marland Kitchen Psychiatric Judgement and insight Intact.. No evidence of depression, anxiety, or agitation.. General Notes: In the region of the right groin and perineum he's got a superficial ulceration surrounded by minimal fungal infection. There is no drainage or cellulitis. Integumentary (Hair, Skin) No suspicious lesions. No crepitus or fluctuance. No peri-wound warmth or erythema. No masses.. Wound #4 status is Open. Original cause of wound was Gradually Appeared. The wound is located on the Right Groin. The wound measures 0.6cm length x 0.4cm width x 0.1cm depth; 0.188cm^2 area and 0.019cm^3 volume. The wound is limited to skin breakdown. There is no tunneling or undermining noted. There is a large amount of serosanguineous drainage noted. The wound margin is distinct with the outline attached to the wound base.  There is large (67-100%) red granulation within the wound bed. There is no necrotic tissue within the wound bed. The periwound skin appearance exhibited: Moist. Periwound temperature was noted as No Abnormality. The periwound has tenderness on palpation. Assessment Active Problems ICD-10 S31.501D - Unspecified open wound of unspecified external genital organs, male, subsequent encounter Z87.820 - Personal history of traumatic brain injury [name] [name], [name] [name] ([id number]) I have recommended we continue with Nystatin powder and drawtex to be change as frequently as need be. I have discussed with the caregiver that this is going to be an ongoing problem due to the fact that his hips and thighs are flexed and it's difficult to absorb moisture in this area. I confirmed that there is no need of any antibiotics at this stage and offloading, adequate vitamins including vitamin A, vitamin C and zinc have been discussed with them. He will be back for review soon. Plan Wound Cleansing: Wound #4 Right Groin: Clean wound with Normal Saline. Cleanse wound with mild soap and water May Shower, gently  pat wound dry prior to applying new dressing. Anesthetic: Wound #4 Right Groin: Topical Lidocaine 4% cream applied to wound bed prior to debridement Skin Barriers/Peri-Wound Care: Wound #4 Right Groin: Antifungal powder-Nystatin Primary Wound Dressing: Wound #4 Right Groin: Other: - nystatin Secondary Dressing: Wound #4 Right Groin: Drawtex Dressing Change Frequency: Wound #4 Right Groin: Change dressing twice daily. Follow-up Appointments: Wound #4 Right Groin: Return Appointment in 2 weeks. Off-Loading: Wound #4 Right Groin: Turn and reposition every 2 hours Additional Orders / Instructions: Wound #4 Right Groin: Stop Smoking [name] [name], [name] [name] ([id number]) I have recommended we continue with Nystatin powder and drawtex to be change as frequently as need be. I have discussed with the caregiver that  this is going to be an ongoing problem due to the fact that his hips and thighs are flexed and it's difficult to absorb moisture in this area. I confirmed that there is no need of any antibiotics at this stage and offloading, adequate vitamins including vitamin A, vitamin C and zinc have been discussed with them. He will be back for review soon. Electronic Signature(s) Signed: 08/05/2016 10:45:17 AM By: Evlyn Kanner MD, FACS Entered By: Evlyn Kanner on 08/05/2016 10:45:16 [name] [name] ([id number]) -------------------------------------------------------------------------------- ROS/PFSH Details Patient Name: [name] [name] Date of Service: 08/05/2016 9:30 AM Medical Record Number: [id number] Patient Account Number: [id number] Date of Birth/Sex: Jul 23, 1986 (30 y.o. Male) Treating RN: Phillis Haggis Primary Care Physician: Bethann Punches Other Clinician: Referring Physician: Bethann Punches Treating Physician/Extender: Rudene Re in Treatment: 0 Information Obtained From Patient Wound History Do you currently have one or more open woundso Yes How many open wounds do you currently haveo 1 Approximately how long have you had your woundso 2 weeks How have you been treating your wound(s) until nowo nystatin Has your wound(s) ever healed and then re-openedo No Have you had any lab work done in the past montho No Have you tested positive for an antibiotic resistant organism (MRSA, VRE)o No Have you tested positive for osteomyelitis (bone infection)o No Have you had any tests for circulation on your legso No Integumentary (Skin) Complaints and Symptoms: Negative for: Wounds Medical History: Positive for: History of pressure wounds Negative for: History of Burn Constitutional Symptoms (General Health) Complaints and Symptoms: No Complaints or Symptoms Eyes Medical History: Negative for: Cataracts; Glaucoma; Optic Neuritis Ear/Nose/Mouth/Throat Complaints and Symptoms: No Complaints  or Symptoms Medical History: Negative for: Chronic sinus problems/congestion; Middle ear problems Hematologic/Lymphatic [name] [name], [name] [name] ([id number]) Medical History: Positive for: Anemia Negative for: Hemophilia; Human Immunodeficiency Virus; Lymphedema; Sickle Cell Disease Respiratory Complaints and Symptoms: Review of System Notes: hx pulmonary embolism lung disease Medical History: Negative for: Aspiration; Asthma; Chronic Obstructive Pulmonary Disease (COPD); Pneumothorax; Sleep Apnea; Tuberculosis Past Medical History Notes: lung tube, trachesotomy, Cardiovascular Complaints and Symptoms: No Complaints or Symptoms Medical History: Negative for: Angina; Arrhythmia; Congestive Heart Failure; Coronary Artery Disease; Deep Vein Thrombosis; Hypertension; Hypotension; Myocardial Infarction; Peripheral Arterial Disease; Peripheral Venous Disease; Phlebitis; Vasculitis Gastrointestinal Complaints and Symptoms: Review of System Notes: incontinent Medical History: Positive for: Hepatitis C - not active Negative for: Cirrhosis ; Colitis; Crohnos; Hepatitis A; Hepatitis B Endocrine Complaints and Symptoms: No Complaints or Symptoms Medical History: Negative for: Type I Diabetes; Type II Diabetes Genitourinary Complaints and Symptoms: Review of System Notes: foley in place Medical HistoryKATO, [name] [name] ([id number]) Negative for: End Stage Renal Disease Past Medical History Notes: foley Immunological Complaints and Symptoms: No Complaints or Symptoms Medical History: Negative for: Lupus Erythematosus; Raynaudos; Scleroderma Musculoskeletal Complaints and Symptoms: No Complaints  or Symptoms Medical History: Negative for: Gout; Rheumatoid Arthritis; Osteoarthritis; Osteomyelitis Neurologic Complaints and Symptoms: Review of System Notes: TBI Medical History: Positive for: Paraplegia - heterotopic; Seizure Disorder Negative for: Dementia; Neuropathy;  Quadriplegia Oncologic Complaints and Symptoms: No Complaints or Symptoms Medical History: Negative for: Received Chemotherapy; Received Radiation Psychiatric Complaints and Symptoms: No Complaints or Symptoms Medical History: Negative for: Anorexia/bulimia; Confinement Anxiety Immunizations Pneumococcal Vaccine: Received Pneumococcal Vaccination: Yes Family and Social History [name] HelperRBY, [name] [name] ([id number]) Cancer: No; Diabetes: No; Heart Disease: No; Hereditary Spherocytosis: No; Hypertension: Yes - Mother; Kidney Disease: No; Lung Disease: No; Seizures: No; Stroke: No; Thyroid Problems: No; Tuberculosis: No; Former smoker; Marital Status - Single; Alcohol Use: Never; Drug Use: No History; Caffeine Use: Never; Financial Concerns: No; Food, Clothing or Shelter Needs: No; Support System Lacking: No; Advanced Directives: No; Patient does not want information on Advanced Directives; Living Will: No Physician Affirmation I have reviewed and agree with the above information. Electronic Signature(s) Signed: 08/05/2016 2:05:36 PM By: Evlyn KannerBritto, Willy Vorce MD, FACS Signed: 08/05/2016 3:33:30 PM By: Alejandro MullingPinkerton, Debra Entered By: Evlyn KannerBritto, Nafisa Olds on 08/05/2016 10:38:12 [name] [name], [name] [name] ([id number]) -------------------------------------------------------------------------------- SuperBill Details Patient Name: [name] [name], Federick Date of Service: 08/05/2016 Medical Record Number: [id number] Patient Account Number: [id number] Date of Birth/Sex: 08/09/1985 (30 y.o. Male) Treating RN: Phillis HaggisPinkerton, Debi Primary Care Physician: Bethann PunchesMiller, Mark Other Clinician: Referring Physician: Bethann PunchesMiller, Mark Treating Physician/Extender: Rudene ReBritto, Troyce Gieske Weeks in Treatment: 0 Diagnosis Coding ICD-10 Codes Code Description Unspecified open wound of unspecified external genital organs, male, subsequent S31.501D encounter Z87.820 Personal history of traumatic brain injury Facility Procedures CPT4 Code: [id number] Description: 99214 - WOUND CARE  VISIT-LEV 4 EST PT Modifier: Quantity: 1 Physician Procedures CPT4: Description Modifier Quantity Code [id number] 99213 - WC PHYS LEVEL 3 - EST PT 1 ICD-10 Description Diagnosis S31.501D Unspecified open wound of unspecified external genital organs, male, subsequent encounter Z87.820 Personal history of traumatic  brain injury Electronic Signature(s) Signed: 08/05/2016 2:05:36 PM By: Evlyn KannerBritto, Celest Reitz MD, FACS Signed: 08/05/2016 3:33:30 PM By: Alejandro MullingPinkerton, Debra Previous Signature: 08/05/2016 10:45:29 AM Version By: Evlyn KannerBritto, Sayuri Rhames MD, FACS Entered By: Alejandro MullingPinkerton, Debra on 08/05/2016 11:47:29

## 2016-08-08 ENCOUNTER — Emergency Department: Payer: BLUE CROSS/BLUE SHIELD

## 2016-08-08 ENCOUNTER — Encounter: Payer: Self-pay | Admitting: *Deleted

## 2016-08-08 ENCOUNTER — Inpatient Hospital Stay
Admission: EM | Admit: 2016-08-08 | Discharge: 2016-08-12 | DRG: 393 | Disposition: A | Discharge status: Home / Self Care | Payer: BLUE CROSS/BLUE SHIELD | Attending: Internal Medicine | Admitting: Internal Medicine

## 2016-08-08 DIAGNOSIS — Z23 Encounter for immunization: Secondary | ICD-10-CM

## 2016-08-08 DIAGNOSIS — L03115 Cellulitis of right lower limb: Secondary | ICD-10-CM | POA: Diagnosis present

## 2016-08-08 DIAGNOSIS — L304 Erythema intertrigo: Secondary | ICD-10-CM | POA: Diagnosis present

## 2016-08-08 DIAGNOSIS — L03311 Cellulitis of abdominal wall: Secondary | ICD-10-CM | POA: Diagnosis present

## 2016-08-08 DIAGNOSIS — Z87891 Personal history of nicotine dependence: Secondary | ICD-10-CM

## 2016-08-08 DIAGNOSIS — E669 Obesity, unspecified: Secondary | ICD-10-CM

## 2016-08-08 DIAGNOSIS — Z87820 Personal history of traumatic brain injury: Secondary | ICD-10-CM

## 2016-08-08 DIAGNOSIS — L0390 Cellulitis, unspecified: Secondary | ICD-10-CM | POA: Diagnosis present

## 2016-08-08 DIAGNOSIS — Z8249 Family history of ischemic heart disease and other diseases of the circulatory system: Secondary | ICD-10-CM

## 2016-08-08 DIAGNOSIS — K9422 Gastrostomy infection: Principal | ICD-10-CM | POA: Diagnosis present

## 2016-08-08 DIAGNOSIS — M419 Scoliosis, unspecified: Secondary | ICD-10-CM | POA: Diagnosis present

## 2016-08-08 DIAGNOSIS — B1920 Unspecified viral hepatitis C without hepatic coma: Secondary | ICD-10-CM | POA: Diagnosis present

## 2016-08-08 DIAGNOSIS — L03313 Cellulitis of chest wall: Secondary | ICD-10-CM

## 2016-08-08 DIAGNOSIS — Z6833 Body mass index (BMI) 33.0-33.9, adult: Secondary | ICD-10-CM

## 2016-08-08 DIAGNOSIS — Z86711 Personal history of pulmonary embolism: Secondary | ICD-10-CM

## 2016-08-08 DIAGNOSIS — J189 Pneumonia, unspecified organism: Secondary | ICD-10-CM

## 2016-08-08 DIAGNOSIS — F419 Anxiety disorder, unspecified: Secondary | ICD-10-CM | POA: Diagnosis present

## 2016-08-08 DIAGNOSIS — J690 Pneumonitis due to inhalation of food and vomit: Secondary | ICD-10-CM | POA: Diagnosis present

## 2016-08-08 DIAGNOSIS — A419 Sepsis, unspecified organism: Secondary | ICD-10-CM | POA: Diagnosis present

## 2016-08-08 DIAGNOSIS — F329 Major depressive disorder, single episode, unspecified: Secondary | ICD-10-CM | POA: Diagnosis present

## 2016-08-08 HISTORY — DX: Enterocolitis due to Clostridium difficile, not specified as recurrent: A04.72

## 2016-08-08 HISTORY — DX: Unspecified psychosis not due to a substance or known physiological condition: F29

## 2016-08-08 LAB — URINALYSIS, COMPLETE (UACMP) WITH MICROSCOPIC
BILIRUBIN URINE: NEGATIVE
GLUCOSE, UA: NEGATIVE mg/dL
HGB URINE DIPSTICK: NEGATIVE
KETONES UR: NEGATIVE mg/dL
LEUKOCYTES UA: NEGATIVE
NITRITE: NEGATIVE
PH: 6 (ref 5.0–8.0)
Protein, ur: NEGATIVE mg/dL
SPECIFIC GRAVITY, URINE: 1.012 (ref 1.005–1.030)
Squamous Epithelial / LPF: NONE SEEN

## 2016-08-08 LAB — CBC WITH DIFFERENTIAL/PLATELET
BASOS ABS: 0 10*3/uL (ref 0–0.1)
BASOS PCT: 0 %
EOS PCT: 0 %
Eosinophils Absolute: 0 10*3/uL (ref 0–0.7)
HCT: 39.2 % — ABNORMAL LOW (ref 40.0–52.0)
Hemoglobin: 13.3 g/dL (ref 13.0–18.0)
LYMPHS PCT: 9 %
Lymphs Abs: 1 10*3/uL (ref 1.0–3.6)
MCH: 28.7 pg (ref 26.0–34.0)
MCHC: 34 g/dL (ref 32.0–36.0)
MCV: 84.6 fL (ref 80.0–100.0)
MONO ABS: 0.8 10*3/uL (ref 0.2–1.0)
Monocytes Relative: 7 %
NEUTROS ABS: 9.8 10*3/uL — AB (ref 1.4–6.5)
Neutrophils Relative %: 84 %
PLATELETS: 213 10*3/uL (ref 150–440)
RBC: 4.64 MIL/uL (ref 4.40–5.90)
RDW: 14 % (ref 11.5–14.5)
WBC: 11.6 10*3/uL — AB (ref 3.8–10.6)

## 2016-08-08 LAB — COMPREHENSIVE METABOLIC PANEL
ALBUMIN: 3.4 g/dL — AB (ref 3.5–5.0)
ALK PHOS: 54 U/L (ref 38–126)
ALT: 14 U/L — ABNORMAL LOW (ref 17–63)
ANION GAP: 7 (ref 5–15)
AST: 15 U/L (ref 15–41)
BILIRUBIN TOTAL: 1.4 mg/dL — AB (ref 0.3–1.2)
BUN: 14 mg/dL (ref 6–20)
CALCIUM: 8.5 mg/dL — AB (ref 8.9–10.3)
CO2: 26 mmol/L (ref 22–32)
Chloride: 99 mmol/L — ABNORMAL LOW (ref 101–111)
Creatinine, Ser: 0.85 mg/dL (ref 0.61–1.24)
GFR calc Af Amer: >60 mL/min (ref >60)
GLUCOSE: 92 mg/dL (ref 65–99)
POTASSIUM: 3.6 mmol/L (ref 3.5–5.1)
Sodium: 132 mmol/L — ABNORMAL LOW (ref 135–145)
TOTAL PROTEIN: 7 g/dL (ref 6.5–8.1)

## 2016-08-08 LAB — INFLUENZA PANEL BY PCR (TYPE A & B)
INFLBPCR: NEGATIVE
Influenza A By PCR: NEGATIVE

## 2016-08-08 LAB — LACTIC ACID, PLASMA: Lactic Acid, Venous: 0.9 mmol/L (ref 0.5–1.9)

## 2016-08-08 MED ORDER — KCL IN DEXTROSE-NACL 20-5-0.9 MEQ/L-%-% IV SOLN
INTRAVENOUS | Status: DC
  Administered 2016-08-09 (×3): via INTRAVENOUS
  Filled 2016-08-08 (×5): qty 1000

## 2016-08-08 MED ORDER — ONDANSETRON HCL 4 MG PO TABS: 4.0000 mg | ORAL_TABLET | Freq: Four times a day (QID) | ORAL | Status: DC | PRN

## 2016-08-08 MED ORDER — LACOSAMIDE 200 MG PO TABS
200.0000 mg | ORAL_TABLET | Freq: Every day | ORAL | Status: DC
  Administered 2016-08-08 – 2016-08-12 (×5): 200 mg via ORAL
  Filled 2016-08-08 (×5): qty 1

## 2016-08-08 MED ORDER — ADULT MULTIVITAMIN W/MINERALS CH
1.0000 | ORAL_TABLET | Freq: Every day | ORAL | Status: DC
  Administered 2016-08-09 – 2016-08-12 (×4): 1 via ORAL
  Filled 2016-08-08 (×5): qty 1

## 2016-08-08 MED ORDER — ONDANSETRON HCL 4 MG/2ML IJ SOLN: 4.0000 mg | Freq: Four times a day (QID) | INTRAMUSCULAR | Status: DC | PRN

## 2016-08-08 MED ORDER — ORAL CARE MOUTH RINSE
15.0000 mL | Freq: Two times a day (BID) | OROMUCOSAL | Status: DC
  Administered 2016-08-09 – 2016-08-12 (×5): 15 mL via OROMUCOSAL

## 2016-08-08 MED ORDER — RISEDRONATE SODIUM 5 MG PO TABS: 35.0000 mg | ORAL_TABLET | ORAL | Status: DC

## 2016-08-08 MED ORDER — APIXABAN 5 MG PO TABS
5.0000 mg | ORAL_TABLET | Freq: Two times a day (BID) | ORAL | Status: DC
  Administered 2016-08-08 – 2016-08-12 (×7): 5 mg via ORAL
  Filled 2016-08-08 (×8): qty 1

## 2016-08-08 MED ORDER — PANTOPRAZOLE SODIUM 40 MG IV SOLR
40.0000 mg | Freq: Once | INTRAVENOUS | Status: AC
  Administered 2016-08-08: 40 mg via INTRAVENOUS
  Filled 2016-08-08: qty 40

## 2016-08-08 MED ORDER — OMEPRAZOLE 2 MG/ML ORAL SUSPENSION: 40.0000 mg | Freq: Two times a day (BID) | ORAL | Status: DC

## 2016-08-08 MED ORDER — SODIUM CHLORIDE 0.9 % IV SOLN
3.0000 g | Freq: Three times a day (TID) | INTRAVENOUS | Status: DC
  Administered 2016-08-08 – 2016-08-09 (×4): 3 g via INTRAVENOUS
  Filled 2016-08-08 (×7): qty 3

## 2016-08-08 MED ORDER — CEFAZOLIN IN D5W 1 GM/50ML IV SOLN
1.0000 g | Freq: Once | INTRAVENOUS | Status: AC
  Administered 2016-08-08: 1 g via INTRAVENOUS
  Filled 2016-08-08: qty 50

## 2016-08-08 MED ORDER — PANTOPRAZOLE SODIUM 40 MG PO PACK
40.0000 mg | PACK | Freq: Two times a day (BID) | ORAL | Status: DC
  Administered 2016-08-09 – 2016-08-12 (×7): 40 mg
  Filled 2016-08-08 (×9): qty 20

## 2016-08-08 MED ORDER — DEXTROSE 5 % IV SOLN
500.0000 mg | Freq: Once | INTRAVENOUS | Status: AC
  Administered 2016-08-08: 500 mg via INTRAVENOUS
  Filled 2016-08-08: qty 500

## 2016-08-08 MED ORDER — ACETAMINOPHEN 325 MG PO TABS
650.0000 mg | ORAL_TABLET | Freq: Four times a day (QID) | ORAL | Status: DC | PRN
  Administered 2016-08-09: 650 mg via ORAL
  Filled 2016-08-08 (×2): qty 2

## 2016-08-08 MED ORDER — BACLOFEN 10 MG PO TABS
20.0000 mg | ORAL_TABLET | Freq: Three times a day (TID) | ORAL | Status: DC
  Administered 2016-08-08 – 2016-08-12 (×11): 20 mg via ORAL
  Filled 2016-08-08 (×11): qty 2

## 2016-08-08 MED ORDER — ONDANSETRON HCL 4 MG/2ML IJ SOLN
4.0000 mg | Freq: Once | INTRAMUSCULAR | Status: AC
  Administered 2016-08-08: 4 mg via INTRAVENOUS
  Filled 2016-08-08: qty 2

## 2016-08-08 MED ORDER — ACETAMINOPHEN 650 MG RE SUPP: 650.0000 mg | Freq: Four times a day (QID) | RECTAL | Status: DC | PRN

## 2016-08-08 MED ORDER — GABAPENTIN 300 MG PO CAPS
900.0000 mg | ORAL_CAPSULE | Freq: Three times a day (TID) | ORAL | Status: DC
  Administered 2016-08-08 – 2016-08-12 (×11): 900 mg via ORAL
  Filled 2016-08-08 (×11): qty 3

## 2016-08-08 MED ORDER — ENOXAPARIN SODIUM 40 MG/0.4ML ~~LOC~~ SOLN: 40.0000 mg | SUBCUTANEOUS | Status: DC

## 2016-08-08 MED ORDER — SODIUM CHLORIDE 0.9 % IV BOLUS (SEPSIS)
1000.0000 mL | Freq: Once | INTRAVENOUS | Status: AC
  Administered 2016-08-08: 1000 mL via INTRAVENOUS

## 2016-08-08 MED ORDER — LURASIDONE HCL 40 MG PO TABS
40.0000 mg | ORAL_TABLET | Freq: Two times a day (BID) | ORAL | Status: DC
  Administered 2016-08-08 – 2016-08-12 (×9): 40 mg via ORAL
  Filled 2016-08-08 (×10): qty 1

## 2016-08-08 MED ORDER — PROPRANOLOL HCL ER 60 MG PO CP24
60.0000 mg | ORAL_CAPSULE | Freq: Every day | ORAL | Status: DC
  Administered 2016-08-10 – 2016-08-12 (×3): 60 mg via ORAL
  Filled 2016-08-08 (×4): qty 1

## 2016-08-08 MED ORDER — PNEUMOCOCCAL VAC POLYVALENT 25 MCG/0.5ML IJ INJ
0.5000 mL | INJECTION | INTRAMUSCULAR | Status: AC
  Administered 2016-08-10: 0.5 mL via INTRAMUSCULAR
  Filled 2016-08-08 (×2): qty 0.5

## 2016-08-08 MED ORDER — ALBUTEROL SULFATE (2.5 MG/3ML) 0.083% IN NEBU
3.0000 mL | INHALATION_SOLUTION | Freq: Three times a day (TID) | RESPIRATORY_TRACT | Status: DC
  Administered 2016-08-08 – 2016-08-12 (×12): 3 mL via RESPIRATORY_TRACT
  Filled 2016-08-08 (×12): qty 3

## 2016-08-08 MED ORDER — DOXEPIN HCL 10 MG PO CAPS
30.0000 mg | ORAL_CAPSULE | Freq: Every day | ORAL | Status: DC
  Administered 2016-08-08 – 2016-08-12 (×4): 30 mg via ORAL
  Filled 2016-08-08 (×5): qty 3

## 2016-08-08 MED ORDER — TIZANIDINE HCL 2 MG PO TABS
2.0000 mg | ORAL_TABLET | Freq: Every day | ORAL | Status: DC
  Administered 2016-08-08 – 2016-08-12 (×4): 2 mg via ORAL
  Filled 2016-08-08 (×5): qty 1

## 2016-08-08 MED ORDER — SODIUM CHLORIDE 0.9 % IV BOLUS (SEPSIS)
500.0000 mL | Freq: Once | INTRAVENOUS | Status: AC
  Administered 2016-08-08: 500 mL via INTRAVENOUS

## 2016-08-08 NOTE — ED Notes (Signed)
[facility] here to check in patient's cpap machine.

## 2016-08-08 NOTE — H&P (Signed)
[facility] - [place] [facility]   PATIENT NAME: [name] [name] Folson    MR#:  [id number]  DATE OF BIRTH:  08/10/1985  DATE OF ADMISSION:  08/08/2016  PRIMARY CARE PHYSICIAN: Danella PentonMark F Miller, MD   REQUESTING/REFERRING PHYSICIAN:   CHIEF COMPLAINT:   Chief Complaint  Patient presents with  . Fever    HISTORY OF PRESENT ILLNESS: [name] [name] Lotito  is a 31 y.o. male with a known history of Traumatic brain injury, anxiety, depression, hepatitis C, pulmonary embolism, status post IVC filter placement, dysphagia, G-tube placement, on the bolus G-tube feeds, who presents to the hospital with complaints of high fever which started last night. He also was noted to have erythema in the right buttock and flank area . He had an episode of vomiting yesterday and choking with his vomitus. On arrival to the hospital, , He was noted to have pneumonia, which was felt to be due to aspiration. Hospitalist services were contacted for admission     PAST MEDICAL HISTORY:   Past Medical History:  Diagnosis Date  . Anemia   . Anxiety   . Depression   . Drug abuse   . Hepatitis C   . Hepatitis C   . Pulmonary emboli (HCC)   . Scoliosis   . Seizures (HCC)   . Subarachnoid hemorrhage (HCC)   . TBI (traumatic brain injury) (HCC) 07/2011    PAST SURGICAL HISTORY: Past Surgical History:  Procedure Laterality Date  . ANKLE SURGERY Bilateral   . BRAIN SURGERY    . COLONOSCOPY WITH PROPOFOL N/A 12/29/2015   Procedure: COLONOSCOPY WITH PROPOFOL;  Surgeon: Scot Junobert T Elliott, MD;  [facility];  Service: Endoscopy;  Laterality: N/A;  . FRACTURE SURGERY     left acetabulum  . ILEOSTOMY    . IVC FILTER PLACEMENT ([facility] HX)    . Janeway gastrostomy  07/02/2014  . lung tube    . PEG TUBE PLACEMENT    . TRACHEOSTOMY      SOCIAL HISTORY:  Social History  Substance Use Topics  . Smoking status: Former Smoker    Quit date: 08/07/2011  . Smokeless tobacco: Former NeurosurgeonUser    Quit date:  07/30/2011  . Alcohol use No    FAMILY HISTORY:  Family History  Problem Relation Age of Onset  . Hypertension    . Hyperlipidemia Father   . Autoimmune disease Mother   . Cancer Maternal Grandmother   . Heart disease Maternal Grandfather     DRUG ALLERGIES:  Allergies  Allergen Reactions  . Ambien [Zolpidem] Other (See Comments)    Patient start hallucinating.   . Ativan [Lorazepam] Itching and Other (See Comments)    Patient gets hot and sweaty. Hallucinations   . Depakote Er [Divalproex Sodium Er] Itching and Other (See Comments)    Patient gets hot and sweaty.  . Dilaudid [Hydromorphone] Hives and Other (See Comments)    Patient gets hot and sweaty.  Marland Kitchen. Keppra [Levetiracetam] Itching and Other (See Comments)    Patient gets hot and sweaty.    Review of Systems  Unable to perform ROS: Mental acuity    MEDICATIONS AT HOME:  Prior to Admission medications   Medication Sig Start Date End Date Taking? Authorizing Provider  apixaban (ELIQUIS) 5 MG TABS tablet Take 5 mg by mouth 2 (two) times daily.    Yes Historical Provider, MD  baclofen (LIORESAL) 20 MG tablet Take 20 mg by mouth 3 (three) times daily.   Yes  Historical Provider, MD  doxepin (SINEQUAN) 10 MG capsule Take 30 mg by mouth at bedtime.   Yes Historical Provider, MD  gabapentin (NEURONTIN) 300 MG capsule Take 900 mg by mouth 3 (three) times daily.    Yes Historical Provider, MD  indomethacin (INDOCIN SR) 75 MG CR capsule Take 75 mg by mouth daily with breakfast.   Yes Historical Provider, MD  lacosamide (VIMPAT) 200 MG TABS tablet Take 200 mg by mouth daily.    Yes Historical Provider, MD  lurasidone (LATUDA) 80 MG TABS tablet Take 40 mg by mouth 2 (two) times daily. Patient takes 1/2 tablet (40mg ) at 8 am and 3 pm   Yes Historical Provider, MD  pantoprazole (PROTONIX) 40 MG tablet Take 40 mg by mouth 2 (two) times daily.   Yes Historical Provider, MD  propranolol ER (INDERAL LA) 60 MG 24 hr capsule Take 1 capsule  (60 mg total) by mouth daily. 06/25/15  Yes Vipul Sherryll Burger, MD  tiZANidine (ZANAFLEX) 2 MG tablet Take 2 mg by mouth at bedtime.   Yes Historical Provider, MD  albuterol (PROVENTIL) (2.5 MG/3ML) 0.083% nebulizer solution Inhale 3 mLs into the lungs 3 (three) times daily. Patient not taking: Reported on 08/08/2016 11/21/15   Merwyn Katos, MD  Multiple Vitamins-Minerals (MULTIVITAMIN WITH MINERALS) tablet Take 1 tablet by mouth daily.    Historical Provider, MD  risedronate (ACTONEL) 35 MG tablet Take 35 mg by mouth every 7 (seven) days. with water on empty stomach, nothing by mouth or lie down for next 30 minutes. Take on Saturday.    Historical Provider, MD      PHYSICAL EXAMINATION:   VITAL SIGNS: Blood pressure 115/64, pulse 71, temperature 98.7 F (37.1 C), temperature source Oral, resp. rate 19, height 6' (1.829 m), weight 112 kg (246 lb 14.6 oz), SpO2 96 %.  GENERAL:  31 y.o.-year-old patient lying in the bed in mild distress,. somnolent, very slow to answer questions, intermittently follows commands EYES: Pupils equal, round, reactive to light and accommodation. No scleral icterus. Extraocular muscles intact.  HEENT: Head atraumatic, normocephalic. Oropharynx and nasopharynx clear.  NECK:  Supple, no jugular venous distention. No thyroid enlargement, no tenderness.  LUNGS: Markedly diminished breath sounds bilaterally, poor effort, no wheezing, rales,rhonchi or crepitation. No use of accessory muscles of respiration.  CARDIOVASCULAR: S1, S2 normal. No murmurs, rubs, or gallops.  ABDOMEN: Soft, nontender, nondistended. Bowel sounds present. No organomegaly or mass. G-tube EXTREMITIES: 1+ lower extremity and pedal edema bilaterally, more in left lower extremity, no cyanosis, or clubbing. Significant edema in the right buttock and flank area extending to the groin, indurated, increased warmth to palpation NEUROLOGIC: Cranial nerves II through XII are intact. Muscle strength 5/5 in all  extremities. Sensation intact. Gait not checked.  PSYCHIATRIC: The patient is somnolent, difficult to assess his orientation, very slow to answer questions, intermittently follows commands  SKIN: No obvious rash, lesion, or ulcer. Apart from above   LABORATORY PANEL:   CBC  Recent Labs Lab 08/08/16 1120  WBC 11.6*  HGB 13.3  HCT 39.2*  PLT 213  MCV 84.6  MCH 28.7  MCHC 34.0  RDW 14.0  LYMPHSABS 1.0  MONOABS 0.8  EOSABS 0.0  BASOSABS 0.0   ------------------------------------------------------------------------------------------------------------------  Chemistries   Recent Labs Lab 08/08/16 1120  NA 132*  K 3.6  CL 99*  CO2 26  GLUCOSE 92  BUN 14  CREATININE 0.85  CALCIUM 8.5*  AST 15  ALT 14*  ALKPHOS 54  BILITOT 1.4*   ------------------------------------------------------------------------------------------------------------------  Cardiac Enzymes No results for input(s): TROPONINI in the last 168 hours. ------------------------------------------------------------------------------------------------------------------  RADIOLOGY: Dg Chest Port 1 View  Result Date: 08/08/2016 CLINICAL DATA:  Fever, rash, sepsis EXAM: PORTABLE CHEST 1 VIEW COMPARISON:  10/04/2015 FINDINGS: Low lung volumes. There is left base atelectasis or infiltrate. Right lung is clear. Heart is borderline in size. No effusions or acute bony abnormality. IMPRESSION: Low lung volumes.  Left base atelectasis or infiltrate. Electronically Signed   By: Charlett Nose M.D.   On: 08/08/2016 11:46    EKG: Orders placed or performed during the hospital encounter of 08/08/16  . EKG 12-Lead  . EKG 12-Lead  EKG in emergency room reveals sinus rhythm at 70 bpm, normal axis, anterior infarct age undetermined, no acute ST changes  IMPRESSION AND PLAN:  Active Problems:   Cellulitis  #1. Sepsis due to cellulitis and aspiration pneumonitis, admit patient, medical floor, initiate him on Unasyn, get  the urine cultures, blood cultures #2. Right buttock area and groin, skin cellulitis, continue Unasyn #3. Nausea and vomiting 1 with likely aspiration, supportive therapy, IV fluids, nothing by mouth, no G-tube feeds, discussed with dietitian #4. Aspiration pneumonitis, Unasyn intravenously  All the records are reviewed and case discussed with ED provider. Management plans discussed with the patient, family and they are in agreement.  CODE STATUS: Code Status History    Date Active Date Inactive Code Status Order ID Comments User Context   06/19/2015  8:50 PM 06/25/2015  3:27 PM Full Code [id number]  Shaune Pollack, MD Inpatient   05/25/2015  6:23 AM 05/29/2015  5:03 PM Full Code [id number]  Oralia Manis, MD Inpatient   01/29/2015  3:57 PM 02/14/2015  1:10 PM Full Code [id number]  [name], MD Inpatient       TOTAL TIME TAKING CARE OF THIS PATIENT: 50 minutes.    Katharina Caper M.D on 08/08/2016 at 2:04 PM  Between 7am to 6pm - Pager - [phone] After 6pm go to [email or web address] - password [email or web address] [facility]  [place] [facility]  Office  [phone]  CC: Primary care physician; [name], MD

## 2016-08-08 NOTE — ED Provider Notes (Signed)
[facility] Emergency Department Provider Note  ____________________________________________  Time seen: Approximately 11:06 AM  I have reviewed the triage vital signs and the nursing notes.   HISTORY  Chief Complaint Fever  Level 5 caveat:  Portions of the history and physical were unable to be obtained due to TBI   HPI [name] [name] is a 31 y.o. male with a history of TBI in 2013 who presents for evaluation of fever. Patient started having fevers yesterday as high as 103F. according to the mother he has had foul concentrated urine and also redness to the right lateral abdominal wall/hip area this yesterday. Mother has been giving Tylenol and Motrin around-the-clock. He also has had a dry cough. Patient tells me that he has pain in both of his legs but is unable to give me any further details. He denies chest pain or shortness of breath and abdominal pain. He had one episode of vomiting today but no diarrhea.  Past Medical History:  Diagnosis Date  . Anemia   . Anxiety   . Depression   . Drug abuse   . Hepatitis C   . Hepatitis C   . Pulmonary emboli (HCC)   . Scoliosis   . Seizures (HCC)   . Subarachnoid hemorrhage (HCC)   . TBI (traumatic brain injury) (HCC) 07/2011    Patient Active Problem List   Diagnosis Date Noted  . Cellulitis 08/08/2016  . 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 Open wound of male external genitalia 05/02/2016  . Colitis, Clostridium difficile 06/19/2015  . Hypoxia 05/25/2015  . History of pulmonary embolism 05/25/2015  . OSA on CPAP 05/25/2015  . Pressure ulcer 05/25/2015  . Convulsions/seizures (HCC) 02/10/2015  . Aspiration pneumonia (HCC) 02/07/2015  . Encephalopathy, metabolic 02/07/2015  . Skin irritation 11/09/2014  . TBI (traumatic brain injury) (HCC) 02/04/2012  . 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 Heterotopic ossification 02/04/2012     Past Surgical History:  Procedure Laterality Date  . ANKLE SURGERY Bilateral   . BRAIN SURGERY    . COLONOSCOPY WITH PROPOFOL N/A 12/29/2015   Procedure: COLONOSCOPY WITH PROPOFOL;  Surgeon: Scot Jun, MD;  Location: [facility] ENDOSCOPY;  Service: Endoscopy;  Laterality: N/A;  . FRACTURE SURGERY     left acetabulum  . ILEOSTOMY    . IVC FILTER PLACEMENT ([facility] HX)    . Janeway gastrostomy  07/02/2014  . lung tube    . PEG TUBE PLACEMENT    . TRACHEOSTOMY      Prior to Admission medications   Medication Sig Start Date End Date Taking? Authorizing Provider  apixaban (ELIQUIS) 5 MG TABS tablet Take 5 mg by mouth 2 (two) times daily.    Yes Historical Provider, MD  baclofen (LIORESAL) 20 MG tablet Take 20 mg by mouth 3 (three) times daily.   Yes Historical Provider, MD  doxepin (SINEQUAN) 10 MG capsule Take 30 mg by mouth at bedtime.   Yes Historical Provider, MD  gabapentin (NEURONTIN) 300 MG capsule Take 900 mg by mouth 3 (three) times daily.    Yes Historical Provider, MD  indomethacin (INDOCIN SR) 75 MG CR capsule Take 75 mg by mouth daily with breakfast.   Yes Historical Provider, MD  lacosamide (VIMPAT) 200 MG TABS tablet Take 200 mg by mouth daily.    Yes Historical Provider, MD  lurasidone (LATUDA) 80 MG TABS tablet Take 40 mg by mouth 2 (two) times daily.  Patient takes 1/2 tablet (40mg ) at 8 am and 3 pm   Yes Historical Provider, MD  pantoprazole (PROTONIX) 40 MG tablet Take 40 mg by mouth 2 (two) times daily.   Yes Historical Provider, MD  propranolol ER (INDERAL LA) 60 MG 24 hr capsule Take 1 capsule (60 mg total) by mouth daily. 06/25/15  Yes Vipul Sherryll BurgerShah, MD  tiZANidine (ZANAFLEX) 2 MG tablet Take 2 mg by mouth at bedtime.   Yes Historical Provider, MD  albuterol (PROVENTIL) (2.5 MG/3ML) 0.083% nebulizer solution Inhale 3 mLs into the lungs 3 (three) times daily. Patient not taking: Reported on 08/08/2016 11/21/15   Merwyn Katosavid B Simonds, MD  Multiple Vitamins-Minerals (MULTIVITAMIN  WITH MINERALS) tablet Take 1 tablet by mouth daily.    Historical Provider, MD  risedronate (ACTONEL) 35 MG tablet Take 35 mg by mouth every 7 (seven) days. with water on empty stomach, nothing by mouth or lie down for next 30 minutes. Take on Saturday.    Historical Provider, MD    Allergies Ambien [zolpidem]; Ativan [lorazepam]; Depakote er [divalproex sodium er]; Dilaudid [hydromorphone]; and Keppra [levetiracetam]  Family History  Problem Relation Age of Onset  . Hypertension    . Hyperlipidemia Father   . Autoimmune disease Mother   . Cancer Maternal Grandmother   . Heart disease Maternal Grandfather     Social History Social History  Substance Use Topics  . Smoking status: Former Smoker    Quit date: 08/07/2011  . Smokeless tobacco: Former NeurosurgeonUser    Quit date: 07/30/2011  . Alcohol use No    Review of Systems  Constitutional: + fever. Eyes: Negative for visual changes. ENT: Negative for sore throat. Neck: No neck pain  Cardiovascular: Negative for chest pain. Respiratory: Negative for shortness of breath. + cough Gastrointestinal: Negative for abdominal pain,  Diarrhea. + vomiting Genitourinary: Negative for dysuria. + foul urine Musculoskeletal: Negative for back pain. Skin: + abdominal wall rash. Neurological: Negative for headaches, weakness or numbness. Psych: No SI or HI  ____________________________________________   PHYSICAL EXAM:  VITAL SIGNS: ED Triage Vitals  Enc Vitals Group     BP 08/08/16 1045 (!) 89/56     Pulse Rate 08/08/16 1045 71     Resp 08/08/16 1045 16     Temp 08/08/16 1045 98.7 F (37.1 C)     Temp Source 08/08/16 1045 Oral     SpO2 08/08/16 1045 95 %     Weight 08/08/16 1101 246 lb 14.6 oz (112 kg)     Height 08/08/16 1101 6' (1.829 m)     Head Circumference --      Peak Flow --      Pain Score --      Pain Loc --      Pain Edu? --      Excl. in GC? --     Constitutional: Alert and oriented, no distress.  HEENT:      Head:  Normocephalic and atraumatic.         Eyes: Conjunctivae are normal. Sclera is non-icteric. EOMI. PERRL      Mouth/Throat: Mucous membranes are moist.       Neck: Supple with no signs of meningismus. Cardiovascular: Regular rate and rhythm. No murmurs, gallops, or rubs. 2+ symmetrical distal pulses are present in all extremities. No JVD. Respiratory: Normal respiratory effort. Lungs are clear to auscultation bilaterally. No wheezes, crackles, or rhonchi.  Gastrointestinal: Soft, non tender, and non distended with positive bowel sounds. No rebound or guarding.  Condom catheter in place.  Musculoskeletal: Nontender with normal range of motion in all extremities. No edema, cyanosis, or erythema of extremities. Full painless ROM of the R hip Neurologic: Normal speech and language. Face is symmetric. Does not move extremities (baseline) Skin: There is a large area of erythema and warmth overlying the R hip/ lower abdominal wall.  ____________________________________________   LABS (all labs ordered are listed, but only abnormal results are displayed)  Labs Reviewed  COMPREHENSIVE METABOLIC PANEL - Abnormal; Notable for the following:       Result Value   Sodium 132 (*)    Chloride 99 (*)    Calcium 8.5 (*)    Albumin 3.4 (*)    ALT 14 (*)    Total Bilirubin 1.4 (*)    All other components within normal limits  CBC WITH DIFFERENTIAL/PLATELET - Abnormal; Notable for the following:    WBC 11.6 (*)    HCT 39.2 (*)    Neutro Abs 9.8 (*)    All other components within normal limits  URINALYSIS, COMPLETE (UACMP) WITH MICROSCOPIC - Abnormal; Notable for the following:    Color, Urine YELLOW (*)    APPearance CLEAR (*)    Bacteria, UA RARE (*)    All other components within normal limits  CULTURE, BLOOD (ROUTINE X 2)  CULTURE, BLOOD (ROUTINE X 2)  URINE CULTURE  LACTIC ACID, PLASMA  INFLUENZA PANEL BY PCR (TYPE A & B, H1N1)   ____________________________________________  EKG  ED ECG  REPORT I, Nita Sickle, the attending physician, personally viewed and interpreted this ECG.  Normal sinus rhythm, rate of 70, normal intervals, normal axis, no ST elevations or depressions.  ____________________________________________  RADIOLOGY  CXR: Low lung volumes. Left base atelectasis or infiltrate. ____________________________________________   PROCEDURES  Procedure(s) performed: None Procedures Critical Care performed: yes  CRITICAL CARE Performed by: Nita Sickle  ?  Total critical care time: 35 min  Critical care time was exclusive of separately billable procedures and treating other patients.  Critical care was necessary to treat or prevent imminent or life-threatening deterioration.  Critical care was time spent personally by me on the following activities: development of treatment plan with patient and/or surrogate as well as nursing, discussions with consultants, evaluation of patient's response to treatment, examination of patient, obtaining history from patient or surrogate, ordering and performing treatments and interventions, ordering and review of laboratory studies, ordering and review of radiographic studies, pulse oximetry and re-evaluation of patient's condition.  ____________________________________________   INITIAL IMPRESSION / ASSESSMENT AND PLAN / ED COURSE  31 y.o. male with a history of TBI in 2013 who presents for evaluation of fever since last night, cough, cellulitis overlying the R hip/lower abdominal wall area, and foul smelling urine. Patient is afebrile here however took Tylenol and Motrin at home before coming to the emergency room. Patient is hypotensive with no tachycardia. Sepsis protocol initiated. Patient with obvious cellulitis to the R abdominal wall?/ Rhip. No evidence of joint involvement with no tenderness to palpation and full painless range of motion of the right hip. Will check for flu, chest x-ray for pneumonia, UA  for urinary tract infection.  Clinical Course as of Aug 08 1532  Thu Aug 08, 2016  1319 CXr concerning for PNA. Patient with cellulitis and PNA, hypotensive and fever of 103F at home. Lactate normal. WBC mildly elevated. Patient was given cefazolin and azithromycin. Will admit to Hospitalist  [CV]    Clinical Course User Index [CV] Nita Sickle, MD  Pertinent labs & imaging results that were available during my care of the patient were reviewed by me and considered in my medical decision making (see chart for details).    ____________________________________________   FINAL CLINICAL IMPRESSION(S) / ED DIAGNOSES  Final diagnoses:  Cellulitis of chest wall  Community acquired pneumonia, unspecified laterality      NEW MEDICATIONS STARTED DURING THIS VISIT:  New Prescriptions   No medications on file     Note:  This document was prepared using Dragon voice recognition software and may include unintentional dictation errors.    Nita Sickle, MD 08/08/16 1535

## 2016-08-08 NOTE — ED Triage Notes (Signed)
Pt here via EMS, pt had a TBI 5 years ago, pt unable to move extremities from old injury except right arm, mother reports fever of 103 yesterday, with one episode of vomiting, rash to right hip, and foul smelling urine

## 2016-08-08 NOTE — ED Notes (Signed)
Spoke with [employer] regarding patient's cpap machine. Reports they will send someone to check it in.

## 2016-08-08 NOTE — ED Notes (Signed)
Patient moved to hospital bed with full linen change. Patient resting comfortably at this time. Even, unlabored respirations noted. Family at bedside. Will continue to monitor.

## 2016-08-08 NOTE — Progress Notes (Addendum)
Per Dr. Emmit PomfretHugelmeyer okay to change oral protonix to one time dose of 40mg  IV instead of oral. Will hold off on multivitamin as pt is NPO. Per MD okay to place order for speech consult, wound care consult, and order for condom catheter.

## 2016-08-08 NOTE — Progress Notes (Signed)
PHARMACIST - PHYSICIAN COMMUNICATION  CONCERNING: P&T Medication Policy Regarding Oral Bisphosphonates  RECOMMENDATION: Your order for alendronate (Fosamax), ibandronate (Boniva), or risedronate (Actonel) has been discontinued at this time.  If the patient's post-hospital medical condition warrants safe use of this class of drugs, please resume the pre-hospital regimen upon discharge.  DESCRIPTION:  Alendronate (Fosamax), ibandronate (Boniva), and risedronate (Actonel) can cause severe esophageal erosions in patients who are unable to remain upright at least 30 minutes after taking this medication.   Since brief interruptions in therapy are thought to have minimal impact on bone mineral density, the Pharmacy & Therapeutics Committee has established that bisphosphonate orders should be routinely discontinued during hospitalization.   To override this safety policy and permit administration of Boniva, Fosamax, or Actonel in the hospital, prescribers must write "DO NOT HOLD" in the comments section when placing the order for this class of medications.   Tavon Corriher, PharmD Clinical Pharmacist  

## 2016-08-08 NOTE — ED Notes (Signed)
EMS pt from home , with complaint of fever and rash to right hip, fever with EMS 101.1 oral , pt is a paraplegic, total care pt , period of confusion with delirium.

## 2016-08-09 ENCOUNTER — Telehealth: Payer: Self-pay | Admitting: Pulmonary Disease

## 2016-08-09 LAB — CBC
HEMATOCRIT: 36.3 % — AB (ref 40.0–52.0)
Hemoglobin: 12.4 g/dL — ABNORMAL LOW (ref 13.0–18.0)
MCH: 28.9 pg (ref 26.0–34.0)
MCHC: 34.1 g/dL (ref 32.0–36.0)
MCV: 84.8 fL (ref 80.0–100.0)
Platelets: 180 10*3/uL (ref 150–440)
RBC: 4.28 MIL/uL — ABNORMAL LOW (ref 4.40–5.90)
RDW: 14.4 % (ref 11.5–14.5)
WBC: 6.8 10*3/uL (ref 3.8–10.6)

## 2016-08-09 LAB — BASIC METABOLIC PANEL
Anion gap: 4 — ABNORMAL LOW (ref 5–15)
BUN: 10 mg/dL (ref 6–20)
CHLORIDE: 109 mmol/L (ref 101–111)
CO2: 26 mmol/L (ref 22–32)
Calcium: 7.9 mg/dL — ABNORMAL LOW (ref 8.9–10.3)
Creatinine, Ser: 0.73 mg/dL (ref 0.61–1.24)
GFR calc Af Amer: >60 mL/min (ref >60)
GFR calc non Af Amer: >60 mL/min (ref >60)
Glucose, Bld: 107 mg/dL — ABNORMAL HIGH (ref 65–99)
POTASSIUM: 3.8 mmol/L (ref 3.5–5.1)
SODIUM: 139 mmol/L (ref 135–145)

## 2016-08-09 LAB — PROCALCITONIN: Procalcitonin: 0.15 ng/mL

## 2016-08-09 LAB — URINE CULTURE: CULTURE: NO GROWTH

## 2016-08-09 LAB — GLUCOSE, CAPILLARY: Glucose-Capillary: 106 mg/dL — ABNORMAL HIGH (ref 65–99)

## 2016-08-09 MED ORDER — JEVITY 1.5 CAL/FIBER PO LIQD
237.0000 mL | Freq: Every day | ORAL | Status: DC
  Administered 2016-08-09 – 2016-08-11 (×4): 237 mL

## 2016-08-09 MED ORDER — BISACODYL 10 MG RE SUPP
10.0000 mg | Freq: Every day | RECTAL | Status: DC | PRN
  Administered 2016-08-09 – 2016-08-10 (×2): 10 mg via RECTAL
  Filled 2016-08-09 (×2): qty 1

## 2016-08-09 MED ORDER — CLOTRIMAZOLE 1 % EX CREA
TOPICAL_CREAM | Freq: Two times a day (BID) | CUTANEOUS | Status: DC
  Administered 2016-08-10: 1 via TOPICAL
  Administered 2016-08-10 – 2016-08-12 (×2): via TOPICAL
  Filled 2016-08-09: qty 15

## 2016-08-09 MED ORDER — FREE WATER
120.0000 mL | Freq: Every day | Status: DC
  Administered 2016-08-09 – 2016-08-12 (×14): 120 mL

## 2016-08-09 MED ORDER — AMPICILLIN-SULBACTAM SODIUM 3 (2-1) G IJ SOLR
3.0000 g | Freq: Four times a day (QID) | INTRAMUSCULAR | Status: DC
  Administered 2016-08-09 – 2016-08-11 (×6): 3 g via INTRAVENOUS
  Filled 2016-08-09 (×10): qty 3

## 2016-08-09 NOTE — Progress Notes (Signed)
Placed interdry in bilateral groin

## 2016-08-09 NOTE — Progress Notes (Signed)
Initial Nutrition Assessment  DOCUMENTATION CODES:   Obesity unspecified  INTERVENTION:  Recommend providing bolus TF regimen of Jevity 1.5 5 cans daily. This regimen provides 1775 kcal, 76 grams of protein, 900 ml H2O daily.  Recommend free water flushes of 60 ml before and after each feed (120 ml 5 times daily).  Can decrease bolus TF regimen pending patient's PO intake.  Patient would benefit from referral for outpatient nutrition counseling for healthy weight loss.   NUTRITION DIAGNOSIS:   Swallowing difficulty related to dysphagia as evidenced by other (see comment) (need for PEG tube, Dysphagia diet with thickened liquids).  GOAL:   Patient will meet greater than or equal to 90% of their needs  MONITOR:   PO intake, Supplement acceptance, Labs, Weight trends, I & O's, TF tolerance  REASON FOR ASSESSMENT:   Consult Enteral/tube feeding initiation and management  ASSESSMENT:   [name] [name]  is a 31 y.o. male with a known history of Traumatic brain injury, anxiety, depression, hepatitis C, pulmonary embolism, status post IVC filter placement, dysphagia, G-tube placement, on the bolus G-tube feeds, who presents to the hospital with complaints of high fever which started last night. Found to have sepsis due to cellulitis and aspiration pneumonia.    -Per SLP evaluation patient placed on Dysphagia 3 diet with Honey Thick liquids.  Spoke with patient's mother at bedside. Patient was alert at time of assessment, but very agitated so unable to participate meaningfully in conversation. Mother reports patient had an episode of emesis yesterday. His intake varies depending on the day. Some days when he is alert and oriented he is able to have soft or minced foods and only requires 1-2 cans of Jevity 1.5 via PEG tube. Other days he may be too lethargic for PO intake so they provide him 5-6 cans Jevity 1.5 on those days. For free water flushes mother reports they provide 1 L bag TID  over 3-4 hours via Kangaroo pump at home (3 L total).  UBW was 185 lbs prior to TBI. Mother reports patient has been gaining weight. Per chart patient was 195 lbs 02/2015 and has since gained steadily to current body weight. Patient's mother reports they are interested in him losing weight slowly back to around 190 lbs as he is difficult to lift and his Pulmonologist has expressed concern about his weight related to work of breathing.  Medications reviewed and include: Unasyn, multivitamin with minerals daily, pantoprazole, D5-NS with KCl 20 mEq/L @ 125 ml/hr (150 grams dextrose, 510 kcal daily).  Labs reviewed: CBG 106, Anion gap 4.  Nutrition-Focused physical exam completed. Findings are no fat depletion, no muscle depletion, and no edema.  Discussed with RN.  Diet Order:  DIET DYS 3 Room service appropriate? Yes with Assist; Fluid consistency: Honey Thick  Skin:  Wound (see comment) (MSAD to buttocks, groin)  Last BM:  08/06/2016  Height:   Ht Readings from Last 1 Encounters:  08/08/16 6' (1.829 m)    Weight:   Wt Readings from Last 1 Encounters:  08/08/16 246 lb 14.6 oz (112 kg)    Ideal Body Weight:  80.9 kg  BMI:  Body mass index is 33.49 kg/m.  Estimated Nutritional Needs:   Kcal:  2000-2200 (18-20 kcal/kg)  Protein:  112-130 grams (1-1.2 grams/kg)  Fluid:  2 L/day  EDUCATION NEEDS:   Education needs no appropriate at this time  Helane RimaLeanne Courney Garrod, MS, RD, LDN Pager: [phone] After Hours Pager: [phone]

## 2016-08-09 NOTE — Progress Notes (Signed)
Per Dr. Amado CoeGouru place order for Infectious disease consult and order for dulcolax suppository daily PRN

## 2016-08-09 NOTE — Progress Notes (Signed)
Pt placed on 2l [place] after neb treatment due to room air sats of 87%. Will continue to monitor.

## 2016-08-09 NOTE — Consult Note (Signed)
WOC Nurse wound consult note Reason for Consult:Redness to scrotal, perineal and groin area.  Mother is concerned about redness and tenderness to right flank.  Area has been marked and the erythema is not spreading. Is on UNasyn  Currently.  Wound type:Infectious  Intertriginous dermatitis to skin folds.  Fungal overgrowth possible.  Pressure Injury POA: N/A Measurement:Generalized erythema to inguinal folds and under scrotum. Condom cath in place.  Wound ZOX:WRUEbed:pink and moist Drainage (amount, consistency, odor) None noted.  Musty odor Periwound:intact Dressing procedure/placement/frequency:Cleanse genital and inguinal areas with soap and water and pat gently dry.  Apply Interdry Ag Measure and cut length of InterDry Ag+ to fit in skin folds that have skin breakdown Tuck InterDry  Ag+ fabric into skin folds in a single layer, allow for 2 inches of overhang from skin edges to allow for wicking to occur May remove to bathe; dry area thoroughly and then tuck into affected areas again Do not apply any creams or ointments when using InterDry Ag+ DO NOT THROW AWAY FOR 5 DAYS unless soiled with stool DO NOT [facility] product, this will inactivate the silver in the material  New sheet of Interdry Ag+ should be applied after 5 days of use if patient continues to have skin breakdown   Will not follow at this time.  Please re-consult if needed.  Maple HudsonKaren Yani Lal RN BSN CWON Pager [phone]

## 2016-08-09 NOTE — Evaluation (Addendum)
Clinical/Bedside Swallow Evaluation Patient Details  Name: Fayrene [name] Bassett MRN: [id number] Date of Birth: 01/31/1986  Today's Date: 08/09/2016 Time: SLP Start Time (ACUTE ONLY): 1100 SLP Stop Time (ACUTE ONLY): 1200 SLP Time Calculation (min) (ACUTE ONLY): 60 min  Past Medical History:  Past Medical History:  Diagnosis Date  . Anemia   . Anxiety   . C. difficile colitis   . Depression   . Drug abuse   . Hepatitis C   . Hepatitis C   . Psychosis   . Pulmonary emboli (HCC)   . Scoliosis   . Seizures (HCC)   . Subarachnoid hemorrhage (HCC)   . TBI (traumatic brain injury) (HCC) 07/2011   Past Surgical History:  Past Surgical History:  Procedure Laterality Date  . ANKLE SURGERY Bilateral   . BRAIN SURGERY    . COLONOSCOPY WITH PROPOFOL N/A 12/29/2015   Procedure: COLONOSCOPY WITH PROPOFOL;  Surgeon: Scot Junobert T Elliott, MD;  [facility];  Service: Endoscopy;  Laterality: N/A;  . FRACTURE SURGERY     left acetabulum  . ILEOSTOMY    . IVC FILTER PLACEMENT ([facility] HX)    . Janeway gastrostomy  07/02/2014  . lung tube    . PEG TUBE PLACEMENT    . TRACHEOSTOMY     HPI:  Pt is a 31 y.o. male with a known history of multiple medical issues including Traumatic brain injury, anxiety, depression, hepatitis C, drug abuse, pulmonary embolism, status post IVC filter placement, dysphagia, G-tube placement, on the bolus G-tube feeds, who presents to the hospital with complaints of high fever which started last night. He also was noted to have erythema in the right buttock and flank area . He had an episode of vomiting yesterday and choking with his vomitus. On arrival to the hospital, , He was noted to have pneumonia, which was felt to be due to aspiration. Hospitalist services were contacted for admission. Currently, pt is awake/alert and verbally conversive w/ some perseveration in his communications; Cognitive decline impacting his communication abilities d/t TBI (baseline). Pt eats a  Dysphagia 3 type diet w/ HONEY consistency liquids at home . He has been seen by this service as well as OP ST services for both dysphagia and language therapy over the past years.    Assessment / Plan / Recommendation Clinical Impression  Pt appears close to, or at, his baseline for toleration of a Dysphagia level 3 diet w/ HONEY consistency liquids - baseline liquid consistency for him for ther past ~2-3 years per notes/Mother. He appears at reduced risk for aspiration w/ this diet consistency when following aspiration precautions including monitoring for oral clearing b/t bites/sips. Pt consumed po trials given during this evaluation w/ no overt s/s of aspriation noted; grossly wfl oral phase management and clearing of boluses. Pt requires full feeding assistance d/t bilateral UE weakness secondary to post effects of TBI. Recommend return to baseline diet of Dysphagia level 3 w/ HONEY consistency liquids; aspiration precautions; feeding support at all meals; Pills given in Puree or w/ Honey consistency liquids and crushed only if necessary (and able) for easier, safer swallowing. Pt does continue to receive TFs via PEG; water flushes (for support) per Mother and Dietician. As pt appears at his baseline, no skilled ST services indicated at this time. NSG to reconsult if any decline in status while admitted. Mother agreed; NSG updated.  Addendum: d/t pt both eating by mouth and receiving TFs via PEG, recommend monitoring for any s/s of Reflux and Regurgitation  which could increase risk for aspiration of such and impact his Pulmonary status.      Aspiration Risk  Mild aspiration risk (baseline)   Diet Recommendation  Dysphagia level 3(mech soft) w/ HONEY consistency liquids; aspiration precautions; feeding support and monitoring at meals.   Medication Administration: Whole meds with puree (crush in puree if necessary, able to)    Other  Recommendations Recommended Consults:  (Dietician f/u) Oral Care  Recommendations: Oral care BID;Staff/trained caregiver to provide oral care Other Recommendations: Order thickener from pharmacy;Prohibited food (jello, ice cream, thin soups);Remove water pitcher;Have oral suction available   Follow up Recommendations None      Frequency and Duration            Prognosis Prognosis for Safe Diet Advancement: Fair (-Good) Barriers to Reach Goals: Cognitive deficits (TBI)      Swallow Study   General Date of Onset: 08/08/16 HPI: Pt is a 31 y.o. male with a known history of multiple medical issues including Traumatic brain injury, anxiety, depression, hepatitis C, drug abuse, pulmonary embolism, status post IVC filter placement, dysphagia, G-tube placement, on the bolus G-tube feeds, who presents to the hospital with complaints of high fever which started last night. He also was noted to have erythema in the right buttock and flank area . He had an episode of vomiting yesterday and choking with his vomitus. On arrival to the hospital, , He was noted to have pneumonia, which was felt to be due to aspiration. Hospitalist services were contacted for admission. Currently, pt is awake/alert and verbally conversive w/ some perseveration in his communications; Cognitive decline impacting his communication abilities d/t TBI (baseline). Pt eats a Dysphagia 3 type diet w/ HONEY consistency liquids at home . He has been seen by this service as well as OP ST services for both dysphagia and language therapy over the past years.  Type of Study: Bedside Swallow Evaluation Previous Swallow Assessment: previous hospitalizations Diet Prior to this Study: Dysphagia 3 (soft);Honey-thick liquids (baseline) Temperature Spikes Noted: Yes (wbc 6.8) Respiratory Status: Nasal cannula (2 liters) History of Recent Intubation: No Behavior/Cognition: Alert;Cooperative;Pleasant mood;Confused;Distractible;Requires cueing Oral Cavity Assessment: Within Functional Limits Oral Care Completed  by SLP: Recent completion by staff Oral Cavity - Dentition: Adequate natural dentition Vision:  (n/a) Self-Feeding Abilities: Total assist (UE weakness bilaterally) Patient Positioning: Upright in bed Baseline Vocal Quality: Low vocal intensity (mumbled speech) Volitional Cough: Strong Volitional Swallow: Able to elicit    Oral/Motor/Sensory Function Overall Oral Motor/Sensory Function: Within functional limits (grossly)   Ice Chips Ice chips: Not tested   Thin Liquid Thin Liquid: Not tested    Nectar Thick Nectar Thick Liquid: Not tested   Honey Thick Honey Thick Liquid: Within functional limits Presentation: Cup;Spoon (fed; 8 trials) Other Comments: baseline consistency for pt   Puree Puree: Within functional limits Presentation: Spoon (fed; 3 trials)   Solid   GO   Solid: Within functional limits (mech soft trials) Presentation: Spoon (fed; 2 trials)         Jerilynn Som, MS, CCC-SLP Watson,Katherine 08/09/2016,4:01 PM

## 2016-08-09 NOTE — Progress Notes (Signed)
Per Dr. Amado CoeGouru do not give propronalol for low BP

## 2016-08-09 NOTE — Telephone Encounter (Signed)
Please see message below from pt's mom.

## 2016-08-09 NOTE — Consult Note (Signed)
[facility] Infectious Disease     Reason for Consult: Cellulitis    Referring Physician: Nicholes Mango Date of Admission:  08/08/2016   Active Problems:   Cellulitis   HPI: [name] [name] is a 31 y.o. male admitted with fevers and redness on R hip. He had also episode of vomiting and possible aspiration. He has mmp including traumatic brain injury, anxiety, depression, hepatitis C, pulmonary embolism, status post IVC filter placement, dysphagia, G-tube placement, on the bolus G-tube feeds. He is seen today with his home health aide. She reports recurrent issues with small wound in perineum and buttocks.  Also recurrent candidal infections in groin.   Past Medical History:  Diagnosis Date  . Anemia   . Anxiety   . C. difficile colitis   . Depression   . Drug abuse   . Hepatitis C   . Hepatitis C   . Psychosis   . Pulmonary emboli ([place])   . Scoliosis   . Seizures ([place])   . Subarachnoid hemorrhage ([place])   . TBI (traumatic brain injury) ([place]) 07/2011   Past Surgical History:  Procedure Laterality Date  . ANKLE SURGERY Bilateral   . BRAIN SURGERY    . COLONOSCOPY WITH PROPOFOL N/A 12/29/2015   Procedure: COLONOSCOPY WITH PROPOFOL;  Surgeon: Manya Silvas, MD;  Location: [facility] ENDOSCOPY;  Service: Endoscopy;  Laterality: N/A;  . FRACTURE SURGERY     left acetabulum  . ILEOSTOMY    . IVC FILTER PLACEMENT ([facility] HX)    . Janeway gastrostomy  07/02/2014  . lung tube    . PEG TUBE PLACEMENT    . TRACHEOSTOMY     Social History  Substance Use Topics  . Smoking status: Former Smoker    Quit date: 08/07/2011  . Smokeless tobacco: Former Systems developer    Quit date: 07/30/2011  . Alcohol use No   Family History  Problem Relation Age of Onset  . Hypertension    . Hyperlipidemia Father   . Autoimmune disease Mother   . Cancer Maternal Grandmother   . Heart disease Maternal Grandfather     Allergies:  Allergies  Allergen Reactions  . Ambien [Zolpidem] Other (See Comments)    Patient  start hallucinating.   . Ativan [Lorazepam] Itching and Other (See Comments)    Patient gets hot and sweaty. Hallucinations   . Depakote Er [Divalproex Sodium Er] Itching and Other (See Comments)    Patient gets hot and sweaty.  . Dilaudid [Hydromorphone] Hives and Other (See Comments)    Patient gets hot and sweaty.  Marland Kitchen Keppra [Levetiracetam] Itching and Other (See Comments)    Patient gets hot and sweaty.    Current antibiotics: Antibiotics Given (last 72 hours)    None      MEDICATIONS: . albuterol  3 mL Inhalation TID  . ampicillin-sulbactam (UNASYN) IV  3 g Intravenous Q8H  . apixaban  5 mg Oral BID  . baclofen  20 mg Oral TID  . doxepin  30 mg Oral QHS  . feeding supplement (JEVITY 1.5 CAL/FIBER)  237 mL Per Tube 5 X Daily  . free water  120 mL Per Tube 5 X Daily  . gabapentin  900 mg Oral TID  . lacosamide  200 mg Oral Daily  . lurasidone  40 mg Oral BID  . mouth rinse  15 mL Mouth Rinse BID  . multivitamin with minerals  1 tablet Oral Daily  . pantoprazole sodium  40 mg Per Tube BID  .  pneumococcal 23 valent vaccine  0.5 mL Intramuscular Tomorrow-1000  . propranolol ER  60 mg Oral Daily  . tiZANidine  2 mg Oral QHS    Review of Systems - unable to obtain  OBJECTIVE: Temp:  [98.2 F (36.8 C)-99.3 F (37.4 C)] 98.2 F (36.8 C) (01/12 0915) Pulse Rate:  [64-87] 87 (01/12 0902) Resp:  [13-24] 24 (01/12 0902) BP: (89-117)/(43-68) 103/55 (01/12 1202) SpO2:  [89 %-100 %] 92 % (01/12 0915) Physical Exam  Constitutional: awake, interactive, contractures UE HENT: anicteric  Mouth/Throat: Oropharynx is clear and moist. No oropharyngeal exudate.  Cardiovascular: Normal rate, regular rhythm and normal heart sounds.  Pulmonary/Chest: Effort normal and breath sounds normal. No respiratory distress. He has no wheezes.  Abdominal: Soft. Bowel sounds are normal. He exhibits no distension. There is no tenderness.  Lymphadenopathy: He has no cervical adenopathy.   Neurological: slurred speech EXT - contractures UE and LE  ,Skin: R hip area with marked induration and erythema, no open wounds or areas of fluctance GU - condom cath, It is diff to open his legs due to contractures, some candidal intertrig   LABS: Results for orders placed or performed during the hospital encounter of 08/08/16 (from the past 48 hour(s))  Comprehensive metabolic panel     Status: Abnormal   Collection Time: 08/08/16 11:20 AM  Result Value Ref Range   Sodium 132 (L) 135 - 145 mmol/L   Potassium 3.6 3.5 - 5.1 mmol/L   Chloride 99 (L) 101 - 111 mmol/L   CO2 26 22 - 32 mmol/L   Glucose, Bld 92 65 - 99 mg/dL   BUN 14 6 - 20 mg/dL   Creatinine, Ser 0.85 0.61 - 1.24 mg/dL   Calcium 8.5 (L) 8.9 - 10.3 mg/dL   Total Protein 7.0 6.5 - 8.1 g/dL   Albumin 3.4 (L) 3.5 - 5.0 g/dL   AST 15 15 - 41 U/L   ALT 14 (L) 17 - 63 U/L   Alkaline Phosphatase 54 38 - 126 U/L   Total Bilirubin 1.4 (H) 0.3 - 1.2 mg/dL   GFR calc non Af Amer >60 >60 mL/min   GFR calc Af Amer >60 >60 mL/min    Comment: (NOTE) The eGFR has been calculated using the CKD EPI equation. This calculation has not been validated in all clinical situations. eGFR's persistently <60 mL/min signify possible Chronic Kidney Disease.    Anion gap 7 5 - 15  CBC WITH DIFFERENTIAL     Status: Abnormal   Collection Time: 08/08/16 11:20 AM  Result Value Ref Range   WBC 11.6 (H) 3.8 - 10.6 K/uL   RBC 4.64 4.40 - 5.90 MIL/uL   Hemoglobin 13.3 13.0 - 18.0 g/dL   HCT 39.2 (L) 40.0 - 52.0 %   MCV 84.6 80.0 - 100.0 fL   MCH 28.7 26.0 - 34.0 pg   MCHC 34.0 32.0 - 36.0 g/dL   RDW 14.0 11.5 - 14.5 %   Platelets 213 150 - 440 K/uL   Neutrophils Relative % 84 %   Neutro Abs 9.8 (H) 1.4 - 6.5 K/uL   Lymphocytes Relative 9 %   Lymphs Abs 1.0 1.0 - 3.6 K/uL   Monocytes Relative 7 %   Monocytes Absolute 0.8 0.2 - 1.0 K/uL   Eosinophils Relative 0 %   Eosinophils Absolute 0.0 0 - 0.7 K/uL   Basophils Relative 0 %    Basophils Absolute 0.0 0 - 0.1 K/uL  Blood Culture (routine x 2)  Status: None (Preliminary result)   Collection Time: 08/08/16 11:21 AM  Result Value Ref Range   Specimen Description BLOOD RIGHT HAND    Special Requests      BOTTLES DRAWN AEROBIC AND ANAEROBIC AER 10ML ANA 9ML   Culture NO GROWTH < 24 HOURS    Report Status PENDING   Urine culture     Status: None   Collection Time: 08/08/16 11:21 AM  Result Value Ref Range   Specimen Description URINE, RANDOM    Special Requests NONE    Culture NO GROWTH Performed at [facility]     Report Status 08/09/2016 FINAL   Lactic acid, plasma     Status: None   Collection Time: 08/08/16 11:21 AM  Result Value Ref Range   Lactic Acid, Venous 0.9 0.5 - 1.9 mmol/L  Influenza panel by PCR (type A & B, H1N1)     Status: None   Collection Time: 08/08/16 11:21 AM  Result Value Ref Range   Influenza A By PCR NEGATIVE NEGATIVE   Influenza B By PCR NEGATIVE NEGATIVE    Comment: (NOTE) The Xpert Xpress Flu assay is intended as an aid in the diagnosis of  influenza and should not be used as a sole basis for treatment.  This  assay is FDA approved for nasopharyngeal swab specimens only. Nasal  washings and aspirates are unacceptable for Xpert Xpress Flu testing.   Urinalysis, Complete w Microscopic     Status: Abnormal   Collection Time: 08/08/16 11:22 AM  Result Value Ref Range   Color, Urine YELLOW (A) YELLOW   APPearance CLEAR (A) CLEAR   Specific Gravity, Urine 1.012 1.005 - 1.030   pH 6.0 5.0 - 8.0   Glucose, UA NEGATIVE NEGATIVE mg/dL   Hgb urine dipstick NEGATIVE NEGATIVE   Bilirubin Urine NEGATIVE NEGATIVE   Ketones, ur NEGATIVE NEGATIVE mg/dL   Protein, ur NEGATIVE NEGATIVE mg/dL   Nitrite NEGATIVE NEGATIVE   Leukocytes, UA NEGATIVE NEGATIVE   RBC / HPF 0-5 0 - 5 RBC/hpf   WBC, UA 0-5 0 - 5 WBC/hpf   Bacteria, UA RARE (A) NONE SEEN   Squamous Epithelial / LPF NONE SEEN NONE SEEN  Blood Culture (routine x 2)      Status: None (Preliminary result)   Collection Time: 08/08/16 11:30 AM  Result Value Ref Range   Specimen Description BLOOD RIGHT ARM    Special Requests      BOTTLES DRAWN AEROBIC AND ANAEROBIC AER 5ML ANA 4ML   Culture NO GROWTH < 24 HOURS    Report Status PENDING   Basic metabolic panel     Status: Abnormal   Collection Time: 08/09/16  4:45 AM  Result Value Ref Range   Sodium 139 135 - 145 mmol/L    Comment: ELECTROLYTES REPEATED.[facility] Kitchen[facility]   Potassium 3.8 3.5 - 5.1 mmol/L   Chloride 109 101 - 111 mmol/L   CO2 26 22 - 32 mmol/L   Glucose, Bld 107 (H) 65 - 99 mg/dL   BUN 10 6 - 20 mg/dL   Creatinine, Ser 0.73 0.61 - 1.24 mg/dL   Calcium 7.9 (L) 8.9 - 10.3 mg/dL   GFR calc non Af Amer >60 >60 mL/min   GFR calc Af Amer >60 >60 mL/min    Comment: (NOTE) The eGFR has been calculated using the CKD EPI equation. This calculation has not been validated in all clinical situations. eGFR's persistently <60 mL/min signify possible Chronic Kidney Disease.    Anion gap  4 (L) 5 - 15  CBC     Status: Abnormal   Collection Time: 08/09/16  4:45 AM  Result Value Ref Range   WBC 6.8 3.8 - 10.6 K/uL   RBC 4.28 (L) 4.40 - 5.90 MIL/uL   Hemoglobin 12.4 (L) 13.0 - 18.0 g/dL   HCT 36.3 (L) 40.0 - 52.0 %   MCV 84.8 80.0 - 100.0 fL   MCH 28.9 26.0 - 34.0 pg   MCHC 34.1 32.0 - 36.0 g/dL   RDW 14.4 11.5 - 14.5 %   Platelets 180 150 - 440 K/uL  Glucose, capillary     Status: Abnormal   Collection Time: 08/09/16  7:57 AM  Result Value Ref Range   Glucose-Capillary 106 (H) 65 - 99 mg/dL   No components found for: ESR, C REACTIVE PROTEIN MICRO: Recent Results (from the past 720 hour(s))  Blood Culture (routine x 2)     Status: None (Preliminary result)   Collection Time: 08/08/16 11:21 AM  Result Value Ref Range Status   Specimen Description BLOOD RIGHT HAND  Final   Special Requests   Final    BOTTLES DRAWN AEROBIC AND ANAEROBIC AER 10ML ANA 9ML   Culture NO GROWTH < 24 HOURS  Final   Report  Status PENDING  Incomplete  Urine culture     Status: None   Collection Time: 08/08/16 11:21 AM  Result Value Ref Range Status   Specimen Description URINE, RANDOM  Final   Special Requests NONE  Final   Culture NO GROWTH Performed at [facility]   Final   Report Status 08/09/2016 FINAL  Final  Blood Culture (routine x 2)     Status: None (Preliminary result)   Collection Time: 08/08/16 11:30 AM  Result Value Ref Range Status   Specimen Description BLOOD RIGHT ARM  Final   Special Requests   Final    BOTTLES DRAWN AEROBIC AND ANAEROBIC AER 5ML ANA 4ML   Culture NO GROWTH < 24 HOURS  Final   Report Status PENDING  Incomplete    IMAGING: Dg Chest Port 1 View  Result Date: 08/08/2016 CLINICAL DATA:  Fever, rash, sepsis EXAM: PORTABLE CHEST 1 VIEW COMPARISON:  10/04/2015 FINDINGS: Low lung volumes. There is left base atelectasis or infiltrate. Right lung is clear. Heart is borderline in size. No effusions or acute bony abnormality. IMPRESSION: Low lung volumes.  Left base atelectasis or infiltrate. Electronically Signed   By: Rolm Baptise M.D.   On: 08/08/2016 11:46    Assessment:   [name] [name] is a 31 y.o. 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 WBC on admit 11->6. Has fevers. Started on unasyn and azithro. No evidence of abscess or skin breakdown on R hip.   Recommendations Cont unasyn (covers both aspiration and the R hip cellulitis) However if worsens would add vancomycin as risk of MRSA. For the diffuse candidal infection on groin would start clotrimazole cream.  Thank you very much for allowing me to participate in the care of this patient. Please call with questions.   Cheral Marker. Ola Spurr, MD

## 2016-08-09 NOTE — Progress Notes (Signed)
[facility] Physicians - [place] at [facility]   PATIENT NAME: [name] [name]    MR#:  [id number]  DATE OF BIRTH:  August 20, 1985  SUBJECTIVE:  CHIEF COMPLAINT:  Patient is reporting that he is hungry. Mother is at bedside, concerned FOR Aspiration  REVIEW OF SYSTEMS:  CONSTITUTIONAL: No fever, fatigue or weakness.  EYES: No blurred or double vision.  EARS, NOSE, AND THROAT: No tinnitus or ear pain.  RESPIRATORY: No cough, shortness of breath, wheezing or hemoptysis.  CARDIOVASCULAR: No chest pain, orthopnea, edema.  GASTROINTESTINAL: No nausea, vomiting, diarrhea or abdominal pain.  GENITOURINARY: No dysuria, hematuria.  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  PSYCHIATRY: Denies any depression  DRUG ALLERGIES:   Allergies  Allergen Reactions  . Ambien [Zolpidem] Other (See Comments)    Patient start hallucinating.   . Ativan [Lorazepam] Itching and Other (See Comments)    Patient gets hot and sweaty. Hallucinations   . Depakote Er [Divalproex Sodium Er] Itching and Other (See Comments)    Patient gets hot and sweaty.  . Dilaudid [Hydromorphone] Hives and Other (See Comments)    Patient gets hot and sweaty.  Marland Kitchen Keppra [Levetiracetam] Itching and Other (See Comments)    Patient gets hot and sweaty.    VITALS:  Blood pressure (!) 101/49, pulse 92, temperature (!) 100.8 F (38.2 C), temperature source Rectal, resp. rate 20, height 6' (1.829 m), weight 112 kg (246 lb 14.6 oz), SpO2 93 %.  PHYSICAL EXAMINATION:  GENERAL:  31 y.o.-year-old patient lying in the bed with no acute distress.  EYES: Pupils equal, round, reactive to light and accommodation. No scleral icterus. Extraocular muscles intact.  HEENT: Head atraumatic, normocephalic. Oropharynx and nasopharynx  clear.  NECK:  Supple, no jugular venous distention. No thyroid enlargement, no tenderness.  LUNGS: Normal breath sounds bilaterally, no wheezing, rales,rhonchi or crepitation. No use of accessory muscles of respiration.  CARDIOVASCULAR: S1, S2 normal. No murmurs, rubs, or gallops.  ABDOMEN: Soft, nontender, nondistended. Bowel sounds present. No organomegaly or mass. Intact G-tube EXTREMITIES: No pedal edema, cyanosis, or clubbing. Chronic contractures NEUROLOGIC: Cranial nerves II through XII are intact. Muscle strength 5/5 in all extremities. Sensation intact. Gait not checked.  PSYCHIATRIC: The patient is alert and oriented x 3.  SKIN: Right hip area is erythematous and slightly edematous, no open wounds   LABORATORY PANEL:   CBC  Recent Labs Lab 08/09/16 0445  WBC 6.8  HGB 12.4*  HCT 36.3*  PLT 180   ------------------------------------------------------------------------------------------------------------------  Chemistries   Recent Labs Lab 08/08/16 1120 08/09/16 0445  NA 132* 139  K 3.6 3.8  CL 99* 109  CO2 26 26  GLUCOSE 92 107*  BUN 14 10  CREATININE 0.85 0.73  CALCIUM 8.5* 7.9*  AST 15  --   ALT 14*  --   ALKPHOS 54  --   BILITOT 1.4*  --    ------------------------------------------------------------------------------------------------------------------  Cardiac Enzymes No results for input(s): TROPONINI in the last 168 hours. ------------------------------------------------------------------------------------------------------------------  RADIOLOGY:  Dg Chest Port 1 View  Result Date: 08/08/2016 CLINICAL DATA:  Fever, rash, sepsis EXAM: PORTABLE CHEST 1 VIEW COMPARISON:  10/04/2015 FINDINGS: Low lung volumes. There is left base atelectasis or infiltrate. Right lung is clear. Heart is borderline in size. No effusions or acute bony abnormality. IMPRESSION: Low lung volumes.  Left base atelectasis or infiltrate.  Electronically Signed   By: Charlett NoseKevin  Dover  M.D.   On: 08/08/2016 11:46    EKG:   Orders placed or performed during the hospital encounter of 08/08/16  . EKG 12-Lead  . EKG 12-Lead    ASSESSMENT AND PLAN:  31 year old with chronic history of traumatic brain injury and hepatitis C presented to the emergency department with fevers  #1. Sepsis due to cellulitis and aspiration pneumonitis Continue Unasyn, Blood cultures negative so far Urine culture is pending Follow-up with ID  #2. Right buttock area and groin, skin cellulitis  continue Unasyn Clotrimazole cream  #3. Nausea and vomiting 1 with likely aspiration Improved.  Patient was seen by speech therapy who has recommended dysphagia diet with honey thick liquids   #4. Aspiration pneumonitis, Unasyn intravenously  #5 history of pulmonary embolism status post IVC filter     All the records are reviewed and case discussed with Care Management/Social Workerr. Management plans discussed with the patient, family and they are in agreement.  CODE STATUS: FC   TOTAL TIME TAKING CARE OF THIS PATIENT: 39  minutes.   POSSIBLE D/C IN 2-3  DAYS, DEPENDING ON CLINICAL CONDITION.  Note: This dictation was prepared with Dragon dictation along with smaller phrase technology. Any transcriptional errors that result from this process are unintentional.   Ramonita LabGouru, Oona Trammel M.D on 08/09/2016 at 4:43 PM  Between 7am to 6pm - Pager - [phone] After 6pm go to [email or web address] - password [email or web address] [facility]  [facility]  Office  [phone]  CC: Primary care physician; Danella PentonMark F Miller, MD

## 2016-08-09 NOTE — Progress Notes (Signed)
Ok to give nighttime meds per G tube per Dr. Anne HahnWillis.

## 2016-08-09 NOTE — Telephone Encounter (Signed)
Pt mom wanted Dr. Sung AmabileSimonds to know [facility]. She states Dr. Sung AmabileSimonds wanted to be contactee when pt was admitted for pulmonary issues

## 2016-08-10 NOTE — Progress Notes (Signed)
[facility] Physicians - [place] at [facility]   PATIENT NAME: [name] [name]    MR#:  [id number]  DATE OF BIRTH:  [birth year]  SUBJECTIVE:  CHIEF COMPLAINT:  Patient is tolerating dysphagia diet as recommended by speech therapy Clinically feeling better  REVIEW OF SYSTEMS:  CONSTITUTIONAL: No fever, fatigue or weakness.  EYES: No blurred or double vision.  EARS, NOSE, AND THROAT: No tinnitus or ear pain.  RESPIRATORY: No cough, shortness of breath, wheezing or hemoptysis.  CARDIOVASCULAR: No chest pain, orthopnea, edema.  GASTROINTESTINAL: No nausea, vomiting, diarrhea or abdominal pain.  GENITOURINARY: No dysuria, hematuria.  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  PSYCHIATRY: Denies any depression  DRUG ALLERGIES:   Allergies  Allergen Reactions  . Ambien [Zolpidem] Other (See Comments)    Patient start hallucinating.   . Ativan [Lorazepam] Itching and Other (See Comments)    Patient gets hot and sweaty. Hallucinations   . Depakote Er [Divalproex Sodium Er] Itching and Other (See Comments)    Patient gets hot and sweaty.  . Dilaudid [Hydromorphone] Hives and Other (See Comments)    Patient gets hot and sweaty.  Marland Kitchen Keppra [Levetiracetam] Itching and Other (See Comments)    Patient gets hot and sweaty.    VITALS:  Blood pressure 104/64, pulse 79, temperature 98.5 F (36.9 C), temperature source Oral, resp. rate 20, height 6' (1.829 m), weight 116 kg (255 lb 12.8 oz), SpO2 96 %.  PHYSICAL EXAMINATION:  GENERAL:  31 y.o.-year-old patient lying in the bed with no acute distress.  EYES: Pupils equal, round, reactive to light and accommodation. No scleral icterus. Extraocular muscles intact.  HEENT: Head atraumatic, normocephalic. Oropharynx and nasopharynx  clear.  NECK:  Supple, no jugular venous distention. No thyroid enlargement, no tenderness.  LUNGS: Normal breath sounds bilaterally, no wheezing, rales,rhonchi or crepitation. No use of accessory muscles of respiration.  CARDIOVASCULAR: S1, S2 normal. No murmurs, rubs, or gallops.  ABDOMEN: Soft, nontender, nondistended. Bowel sounds present. No organomegaly or mass. Intact G-tube EXTREMITIES: No pedal edema, cyanosis, or clubbing. Chronic contractures NEUROLOGIC: Cranial nerves II through XII are intact. Muscle strength 5/5 in all extremities. Sensation intact. Gait not checked.  PSYCHIATRIC: The patient is alert and oriented x 3.  SKIN: Right hip area is less erythematous and slightly edematous, no open wounds   LABORATORY PANEL:   CBC  Recent Labs Lab 08/09/16 0445  WBC 6.8  HGB 12.4*  HCT 36.3*  PLT 180   ------------------------------------------------------------------------------------------------------------------  Chemistries   Recent Labs Lab 08/08/16 1120 08/09/16 0445  NA 132* 139  K 3.6 3.8  CL 99* 109  CO2 26 26  GLUCOSE 92 107*  BUN 14 10  CREATININE 0.85 0.73  CALCIUM 8.5* 7.9*  AST 15  --   ALT 14*  --   ALKPHOS 54  --   BILITOT 1.4*  --    ------------------------------------------------------------------------------------------------------------------  Cardiac Enzymes No results for input(s): TROPONINI in the last 168 hours. ------------------------------------------------------------------------------------------------------------------  RADIOLOGY:  No results found.  EKG:   Orders placed or performed during the hospital encounter of 08/08/16  . EKG 12-Lead  . EKG 12-Lead    ASSESSMENT AND PLAN:  31 year old with chronic history of traumatic brain injury and hepatitis C presented to the emergency department with fevers  #1. Sepsis due to cellulitis and aspiration pneumonitis Continue Unasyn,  Blood cultures negative so far Urine  culture is pending Follow-up with infectious disease  #2. Right buttock area and groin, skin cellulitis  continue Unasyn Clotrimazole cream  #3. Nausea and vomiting 1 with likely aspiration Improved.  Patient was seen by speech therapy who has recommended dysphagia diet with honey thick liquids   #4. Aspiration pneumonitis, Unasyn intravenously, dysphagia diet with thick liquids  #5 history of pulmonary embolism status post IVC filter     All the records are reviewed and case discussed with Care Management/Social Workerr. Management plans discussed with the patient, family and they are in agreement.  CODE STATUS: FC   TOTAL TIME TAKING CARE OF THIS PATIENT: 39  minutes.   POSSIBLE D/C IN 2 DAYS, DEPENDING ON CLINICAL CONDITION.  Note: This dictation was prepared with Dragon dictation along with smaller phrase technology. Any transcriptional errors that result from this process are unintentional.   Ramonita LabGouru, Edgerrin Correia M.D on 08/10/2016 at 4:37 PM  Between 7am to 6pm - Pager - [phone] After 6pm go to [email or web address] - password [email or web address] [facility]  [facility]  Office  [phone]  CC: Primary care physician; Danella PentonMark F Miller, MD

## 2016-08-11 LAB — PROCALCITONIN: Procalcitonin: <0.1 ng/mL

## 2016-08-11 MED ORDER — AMOXICILLIN-POT CLAVULANATE 875-125 MG PO TABS
1.0000 | ORAL_TABLET | Freq: Two times a day (BID) | ORAL | Status: DC
  Administered 2016-08-11 – 2016-08-12 (×3): 1 via ORAL
  Filled 2016-08-11 (×3): qty 1

## 2016-08-11 NOTE — Progress Notes (Signed)
[facility] Physicians - [place] at [facility]   PATIENT NAME: [name] [name]    MR#:  [id number]  DATE OF BIRTH:  20-Nov-1985  SUBJECTIVE:  CHIEF COMPLAINT:  Patient is tolerating dysphagia diet as recommended by speech therapy Clinically feeling better,Caregiver at bedside   REVIEW OF SYSTEMS:  CONSTITUTIONAL: No fever, fatigue or weakness.  EYES: No blurred or double vision.  EARS, NOSE, AND THROAT: No tinnitus or ear pain.  RESPIRATORY: No cough, shortness of breath, wheezing or hemoptysis.  CARDIOVASCULAR: No chest pain, orthopnea, edema.  GASTROINTESTINAL: No nausea, vomiting, diarrhea or abdominal pain.  GENITOURINARY: No dysuria, hematuria.  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  PSYCHIATRY: Denies any depression  DRUG ALLERGIES:   Allergies  Allergen Reactions  . Ambien [Zolpidem] Other (See Comments)    Patient start hallucinating.   . Ativan [Lorazepam] Itching and Other (See Comments)    Patient gets hot and sweaty. Hallucinations   . Depakote Er [Divalproex Sodium Er] Itching and Other (See Comments)    Patient gets hot and sweaty.  . Dilaudid [Hydromorphone] Hives and Other (See Comments)    Patient gets hot and sweaty.  Marland Kitchen Keppra [Levetiracetam] Itching and Other (See Comments)    Patient gets hot and sweaty.    VITALS:  Blood pressure 110/65, pulse 79, temperature 98.6 F (37 C), temperature source Axillary, resp. rate 18, height 6' (1.829 m), weight 116 kg (255 lb 12.8 oz), SpO2 96 %.  PHYSICAL EXAMINATION:  GENERAL:  31 y.o.-year-old patient lying in the bed with no acute distress.  EYES: Pupils equal, round, reactive to light and accommodation. No scleral icterus. Extraocular muscles intact.  HEENT: Head atraumatic, normocephalic.  Oropharynx and nasopharynx clear.  NECK:  Supple, no jugular venous distention. No thyroid enlargement, no tenderness.  LUNGS: Normal breath sounds bilaterally, no wheezing, rales,rhonchi or crepitation. No use of accessory muscles of respiration.  CARDIOVASCULAR: S1, S2 normal. No murmurs, rubs, or gallops.  ABDOMEN: Soft, nontender, nondistended. Bowel sounds present. No organomegaly or mass. Intact G-tube EXTREMITIES: No pedal edema, cyanosis, or clubbing. Chronic contractures NEUROLOGIC: Cranial nerves II through XII are intact. Muscle strength 5/5 in all extremities. Sensation intact. Gait not checked.  PSYCHIATRIC: The patient is alert and oriented x 3.  SKIN: Right hip area is less erythematous and not edematous, no open wounds   LABORATORY PANEL:   CBC  Recent Labs Lab 08/09/16 0445  WBC 6.8  HGB 12.4*  HCT 36.3*  PLT 180   ------------------------------------------------------------------------------------------------------------------  Chemistries   Recent Labs Lab 08/08/16 1120 08/09/16 0445  NA 132* 139  K 3.6 3.8  CL 99* 109  CO2 26 26  GLUCOSE 92 107*  BUN 14 10  CREATININE 0.85 0.73  CALCIUM 8.5* 7.9*  AST 15  --   ALT 14*  --   ALKPHOS 54  --   BILITOT 1.4*  --    ------------------------------------------------------------------------------------------------------------------  Cardiac Enzymes No results for input(s): TROPONINI in the last 168 hours. ------------------------------------------------------------------------------------------------------------------  RADIOLOGY:  No results found.  EKG:   Orders placed or performed during the hospital encounter of 08/08/16  . EKG 12-Lead  . EKG 12-Lead    ASSESSMENT AND PLAN:  31 year old with chronic history of traumatic brain injury and hepatitis C presented to the emergency department with fevers  #1. SepsisSecondary to cellulitis and aspiration pneumonitis  Change IV  Unasyn to by mouth  Augmentin  Blood cultures negative so far Urine culture no growth  Follow-up with infectious disease  #2. Right buttock area and groin, skin cellulitis  clinically improving. Change IV Unasyn to Augmentin by mouth  Clotrimazole creamfor intertrigo   #3. Nausea and vomiting 1 with likely aspiration Improved.  Patient was seen by speech therapy who has recommended dysphagia diet with honey thick liquids   #4. Aspiration pneumonitis, Unasyn intravenously, dysphagia diet with thick liquids  #5 history of pulmonary embolism status post IVC filter     All the records are reviewed and case discussed with Care Management/Social Workerr. Management plans discussed with the patient, family and they are in agreement.  CODE STATUS: FC   TOTAL TIME TAKING CARE OF THIS PATIENT: 39  minutes.   POSSIBLE D/C IN am  DAYS, DEPENDING ON CLINICAL CONDITION.  Note: This dictation was prepared with Dragon dictation along with smaller phrase technology. Any transcriptional errors that result from this process are unintentional.   Ramonita LabGouru, Carson Bogden M.D on 08/11/2016 at 11:22 AM  Between 7am to 6pm - Pager - [phone] After 6pm go to [email or web address] - password [email or web address] [facility]  [facility]  Office  [phone]  CC: Primary care physician; Danella PentonMark F Miller, MD

## 2016-08-12 LAB — CBC
HEMATOCRIT: 37.9 % — AB (ref 40.0–52.0)
Hemoglobin: 12.9 g/dL — ABNORMAL LOW (ref 13.0–18.0)
MCH: 28.8 pg (ref 26.0–34.0)
MCHC: 34 g/dL (ref 32.0–36.0)
MCV: 84.8 fL (ref 80.0–100.0)
Platelets: 237 10*3/uL (ref 150–440)
RBC: 4.47 MIL/uL (ref 4.40–5.90)
RDW: 14.1 % (ref 11.5–14.5)
WBC: 6.7 10*3/uL (ref 3.8–10.6)

## 2016-08-12 LAB — CREATININE, SERUM: Creatinine, Ser: 0.65 mg/dL (ref 0.61–1.24)

## 2016-08-12 MED ORDER — FREE WATER
250.0000 mL | Freq: Every day | Status: DC
  Administered 2016-08-12: 250 mL

## 2016-08-12 MED ORDER — PRO-STAT SUGAR FREE PO LIQD: 30.0000 mL | Freq: Two times a day (BID) | ORAL | 0 refills | Status: DC

## 2016-08-12 MED ORDER — CLOTRIMAZOLE 1 % EX CREA: TOPICAL_CREAM | Freq: Two times a day (BID) | CUTANEOUS | 0 refills | Status: AC

## 2016-08-12 MED ORDER — BISACODYL 10 MG RE SUPP: 10.0000 mg | Freq: Every day | RECTAL | 0 refills | Status: DC | PRN

## 2016-08-12 MED ORDER — FREE WATER: 250.0000 mL | Freq: Every day | 0 refills | Status: AC

## 2016-08-12 MED ORDER — ACETAMINOPHEN 325 MG PO TABS: 650.0000 mg | ORAL_TABLET | Freq: Four times a day (QID) | ORAL | Status: AC | PRN

## 2016-08-12 MED ORDER — PRO-STAT SUGAR FREE PO LIQD: 30.0000 mL | Freq: Two times a day (BID) | ORAL | Status: DC

## 2016-08-12 MED ORDER — JEVITY 1.5 CAL/FIBER PO LIQD: 237.0000 mL | ORAL | 2 refills | Status: AC

## 2016-08-12 MED ORDER — JEVITY 1.5 CAL/FIBER PO LIQD: 237.0000 mL | ORAL | Status: DC

## 2016-08-12 MED ORDER — AMOXICILLIN-POT CLAVULANATE 875-125 MG PO TABS: 1.0000 | ORAL_TABLET | Freq: Two times a day (BID) | ORAL | 0 refills | Status: DC

## 2016-08-12 NOTE — Progress Notes (Signed)
08/12/2016  BP 119/69 (BP Location: Right Arm)   Pulse 71   Temp 98 F (36.7 C) (Oral)   Resp 17   Ht 6' (1.829 m)   Wt 116 kg (255 lb 12.8 oz)   SpO2 94%   BMI 34.69 kg/m  Patient discharged per MD orders. Discharge instructions reviewed with family and family verbalized understanding. Prescriptions discussed and given to patient. Discharged via stretcher escorted by EMS personnel and sister with belongings in hand.  Ron ParkerHerron, Znya Albino D, RN

## 2016-08-12 NOTE — Discharge Instructions (Signed)
Follow-up with primary care physician in a week Continue wound care Chest physiotherapy twice a day Follow-up with pulmonology Dr. Sung AmabileSimonds in 2 weeks

## 2016-08-12 NOTE — Progress Notes (Signed)
Nutrition Follow-up  DOCUMENTATION CODES:   Obesity unspecified  INTERVENTION:  At this time patient does not need bolus TF regimen to meet calorie needs as he is able to meet with PO intake. Per mother this changes if patient is lethargic. Would recommend protein modular such as Pro-Stat to help meet patient's protein needs via PEG tube in addition to PO intake.   Will decrease Jevity 1.5 to one can per day. This provides 255 kcal, 15 grams of protein, 180 ml H2O.  Recommend Pro-Stat 30 ml BID per PEG tube, each supplement provides 100 kcal and 15 grams of protein.  Increase free water flushes to 250 ml 6 times daily as IV fluids discontinued.  Placed order for referral for outpatient nutrition counseling regarding weight management.  NUTRITION DIAGNOSIS:   Swallowing difficulty related to dysphagia as evidenced by other (see comment) (need for PEG tube, Dysphagia diet with thickened liquids).  Ongoing.  GOAL:   Patient will meet greater than or equal to 90% of their needs  Met with calories. Patient only met 76% protein needs.   MONITOR:   PO intake, Supplement acceptance, Labs, Weight trends, I & O's, TF tolerance  REASON FOR ASSESSMENT:   Consult Enteral/tube feeding initiation and management  ASSESSMENT:   [name] [name]  is a 31 y.o. male with a known history of Traumatic brain injury, anxiety, depression, hepatitis C, pulmonary embolism, status post IVC filter placement, dysphagia, G-tube placement, on the bolus G-tube feeds, who presents to the hospital with complaints of high fever which started last night. Found to have sepsis due to cellulitis and aspiration pneumonia.   Spoke with patient and mother at bedside. Patient reports he is tolerating his meals well and is not having any trouble swallowing with the dysphagia 3 diet with honey-thick liquids. Patient enjoys the [employer] and has been order 1-2 per tray. Mother still very concerned about weight.   Discussed  that Magic Cup has 290 kcal and only 9 grams of protein, so it is not the best option for patient since he is receiving plenty of calories and not enough protein. Recommended limiting to only 1 per day as patient really enjoys them. Also encouraged intake of well-balanced meals with adequate protein. Mother reports patient has a hard time with meat even if it is chopped and moist with gravy. She does report he will eat yogurt and protein bars at home. On days patient is eating well he may benefit from a protein modular such as Pro-Stat (100 kcal, 15 grams of protein per 30 ml) versus his Jevity 1.5 as those cans have the same amount of protein as Pro-Stat but more calories (255 kcal, 15 grams of protein). Recommended patient have referral for outpatient nutrition counseling to better address patient's weight. Mother agrees patient would benefit from this in setting of his difficulty breathing.   Meal Completion: 25-100% per chart + 1 can Jevity 1.5 in past 24 hrs.  In the past 24 hours patient has had approximately 2744 kcal (137% minimum estimated kcal needs) and 85 grams of protein (76% minimum estimated protein needs).   Medications reviewed and include: multivitamin with minerals daily, pantoprazole, free water flush 120 ml 5 times daily.   Labs reviewed: Glucose 107, Anion gap 4.   Weight trend: 116 kg on 1/13 (+4 kg from admission weight - likely fluid or change of scale)  Urine output: 1.2 L past 24 hrs plus one additional unmeasured urine output  Discussed with RN.  Diet Order:  DIET DYS 3 Room service appropriate? Yes with Assist; Fluid consistency: Honey Thick  Skin:  Wound (see comment) (MSAD to buttocks, groin)  Last BM:  08/10/2016  Height:   Ht Readings from Last 1 Encounters:  08/08/16 6' (1.829 m)    Weight:   Wt Readings from Last 1 Encounters:  08/10/16 255 lb 12.8 oz (116 kg)    Ideal Body Weight:  80.9 kg  BMI:  Body mass index is 34.69 kg/m.  Estimated  Nutritional Needs:   Kcal:  2000-2200 (18-20 kcal/kg)  Protein:  112-130 grams (1-1.2 grams/kg)  Fluid:  2 L/day  EDUCATION NEEDS:   Education needs no appropriate at this time  Willey Blade, MS, RD, LDN Pager: [phone] After Hours Pager: [phone]

## 2016-08-12 NOTE — Progress Notes (Signed)
[name] Date of Admission:  08/08/2016     ID: [name] [name] is a 31 y.o. male with R hip cellulitis Active Problems:   Cellulitis   Subjective: No fevers, hip improving, some redness around PEG  ROS  Unable to obtain   Medications:  Antibiotics Given (last 72 hours)    Date/Time Action Medication Dose Rate   08/09/16 2044 Given   Ampicillin-Sulbactam (UNASYN) 3 g in sodium chloride 0.9 % 100 mL IVPB 3 g 200 mL/hr   08/10/16 0254 Given   Ampicillin-Sulbactam (UNASYN) 3 g in sodium chloride 0.9 % 100 mL IVPB 3 g 200 mL/hr   08/10/16 0836 Given   Ampicillin-Sulbactam (UNASYN) 3 g in sodium chloride 0.9 % 100 mL IVPB 3 g 200 mL/hr   08/10/16 1500 Given   Ampicillin-Sulbactam (UNASYN) 3 g in sodium chloride 0.9 % 100 mL IVPB 3 g 200 mL/hr   08/10/16 2241 Given   Ampicillin-Sulbactam (UNASYN) 3 g in sodium chloride 0.9 % 100 mL IVPB 3 g 200 mL/hr   08/11/16 0420 Given   Ampicillin-Sulbactam (UNASYN) 3 g in sodium chloride 0.9 % 100 mL IVPB 3 g 200 mL/hr   08/11/16 1200 Given   amoxicillin-clavulanate (AUGMENTIN) 875-125 MG per tablet 1 tablet 1 tablet    08/12/16 0018 Given   amoxicillin-clavulanate (AUGMENTIN) 875-125 MG per tablet 1 tablet 1 tablet    08/12/16 1047 Given   amoxicillin-clavulanate (AUGMENTIN) 875-125 MG per tablet 1 tablet 1 tablet      . albuterol  3 mL Inhalation TID  . amoxicillin-clavulanate  1 tablet Oral Q12H  . apixaban  5 mg Oral BID  . baclofen  20 mg Oral TID  . clotrimazole   Topical BID  . doxepin  30 mg Oral QHS  . [START ON 08/13/2016] feeding supplement (JEVITY 1.5 CAL/FIBER)  237 mL Per Tube Q24H  . feeding supplement (PRO-STAT SUGAR FREE 64)  30 mL Per Tube BID  . free water  250 mL Per Tube 5 X Daily  . gabapentin  900 mg Oral TID  . lacosamide  200 mg Oral Daily  . lurasidone  40 mg Oral BID  . mouth rinse  15 mL Mouth Rinse BID  . multivitamin with minerals  1 tablet Oral Daily  . pantoprazole  sodium  40 mg Per Tube BID  . propranolol ER  60 mg Oral Daily  . tiZANidine  2 mg Oral QHS    Objective: Vital signs in last 24 hours: Temp:  [97.6 F (36.4 C)-99.1 F (37.3 C)] 99.1 F (37.3 C) (01/15 1300) Pulse Rate:  [67-69] 69 (01/15 0825) Resp:  [18] 18 (01/15 0825) BP: (105-127)/(63-69) 127/69 (01/15 0825) SpO2:  [94 %-97 %] 94 % (01/15 0825) FiO2 (%):  [28 %] 28 % (01/15 0810) Constitutional: awake, interactive, contractures UE HENT: anicteric  Mouth/Throat: Oropharynx is clear and moist. No oropharyngeal exudate.  Cardiovascular: Normal rate, regular rhythm and normal heart sounds.  Pulmonary/Chest: Effort normal and breath sounds normal. No respiratory distress. He has no wheezes.  Abdominal: Soft. Bowel sounds are normal. He exhibits no distension. There is no tenderness. Peg site has mild erythema under device securer Lymphadenopathy: He has no cervical adenopathy.  Neurological: slurred speech EXT - contractures UE and LE  ,Skin: R hip area with much improved induration and decreased erythema, no open wounds or areas of fluctance GU - condom cath, It is diff to open his legs due to contractures, some candidal intertrig  Lab Results  Recent Labs  08/12/16 0701  WBC 6.7  HGB 12.9*  HCT 37.9*  CREATININE 0.65    Microbiology: Results for orders placed or performed during the hospital encounter of 08/08/16  Blood Culture (routine x 2)     Status: None (Preliminary result)   Collection Time: 08/08/16 11:21 AM  Result Value Ref Range Status   Specimen Description BLOOD RIGHT HAND  Final   Special Requests   Final    BOTTLES DRAWN AEROBIC AND ANAEROBIC AER ANA   Culture NO GROWTH 4 DAYS  Final   Report Status PENDING  Incomplete  Urine culture     Status: None   Collection Time: 08/08/16 11:21 AM  Result Value Ref Range Status   Specimen Description URINE, RANDOM  Final   Special Requests NONE  Final   Culture NO GROWTH Performed at [facility]   Final   Report Status 08/09/2016 FINAL  Final  Blood Culture (routine x 2)     Status: None (Preliminary result)   Collection Time: 08/08/16 11:30 AM  Result Value Ref Range Status   Specimen Description BLOOD RIGHT ARM  Final   Special Requests   Final    BOTTLES DRAWN AEROBIC AND ANAEROBIC AER ANA   Culture NO GROWTH 4 DAYS  Final   Report Status PENDING  Incomplete    Studies/Results: No results found.  Assessment/Plan: [name] [name] is a 31 y.o. 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 WBC on admit 11->6. Has no further fevers. Started on unasyn and azithro and good improvement with wbc down to 6.7 and resolution of R hip cellulitis. No evidence of abscess or skin breakdown on R hip.   Recommendations Cont augmentin (covers both aspiration and the R hip cellulitis) for 10 days  For the fungal infection around the peg I advised the family to apply clotrimazole 3x a day for a week and call if it does not improve  Vernessa Likes P   08/12/2016, 1:32 PM

## 2016-08-12 NOTE — Discharge Summary (Signed)
[facility] - [place] at [facility]   PATIENT NAME: [name] [name]    MR#:  [id number]  DATE OF BIRTH:  03/02/1986  DATE OF ADMISSION:  08/08/2016 ADMITTING PHYSICIAN: Katharina Caper, MD  DATE OF DISCHARGE: 08/12/16 PRIMARY CARE PHYSICIAN: [name], MD    ADMISSION DIAGNOSIS:  Cellulitis of chest wall [L03.313] Community acquired pneumonia, unspecified laterality [J18.9]  DISCHARGE DIAGNOSIS:  Active Problems:   Cellulitis aspiration pneumonitis  SECONDARY DIAGNOSIS:   Past Medical History:  Diagnosis Date  . Anemia   . Anxiety   . C. difficile colitis   . Depression   . Drug abuse   . Hepatitis C   . Hepatitis C   . Psychosis   . Pulmonary emboli (HCC)   . Scoliosis   . Seizures (HCC)   . Subarachnoid hemorrhage (HCC)   . TBI (traumatic brain injury) (HCC) 07/2011    HOSPITAL COURSE:  HISTORY OF PRESENT ILLNESS: Royce Stegman  is a 31 y.o. male with a known history of Traumatic brain injury, anxiety, depression, hepatitis C, pulmonary embolism, status post IVC filter placement, dysphagia, G-tube placement, on the bolus G-tube feeds, who presents to the hospital with complaints of high fever which started last night. He also was noted to have erythema in the right buttock and flank area . He had an episode of vomiting yesterday and choking with his vomitus. On arrival to the hospital, , He was noted to have pneumonia, which was felt to be due to aspiration. Hospitalist services were contacted for admission  Hospital course  1. SepsisSecondary to cellulitis and aspiration pneumonitis Change IV  Unasyn to by mouth Augmentin  Blood cultures negative so far Urine culture no growth  Follow-up with infectious disease  #2. Right buttock area and groin, skin cellulitis and cellulitis around the PEG tube  clinically improving. Changed IV Unasyn to Augmentin by mouth continue for days Clotrimazole cream 3 times a day for a week for cellulitis  around the PEG tube  #3. Nausea and vomiting 1 with likely aspiration Improved.  Patient was seen by speech therapy who has recommended dysphagia diet with honey thick liquids  Continue diet and medications via mouth on PEG tube as needed at  #4. Aspiration pneumonitis, Unasyn intravenously, dysphagia diet with thick liquids  #5 history of pulmonary embolism status post IVC filter    DISCHARGE CONDITIONS:   fair  CONSULTS OBTAINED:  Treatment Team:  Mick Sell, MD   PROCEDURES none  DRUG ALLERGIES:   Allergies  Allergen Reactions  . Ambien [Zolpidem] Other (See Comments)    Patient start hallucinating.   . Ativan [Lorazepam] Itching and Other (See Comments)    Patient gets hot and sweaty. Hallucinations   . Depakote Er [Divalproex Sodium Er] Itching and Other (See Comments)    Patient gets hot and sweaty.  . Dilaudid [Hydromorphone] Hives and Other (See Comments)    Patient gets hot and sweaty.  Marland Kitchen Keppra [Levetiracetam] Itching and Other (See Comments)    Patient gets hot and sweaty.    DISCHARGE MEDICATIONS:   Current Discharge Medication List    START taking these medications   Details  acetaminophen (TYLENOL) 325 MG tablet Take 2 tablets (650 mg total) by mouth every 6 (six) hours as needed for mild pain (or Fever >/= 101).    Amino Acids-Protein Hydrolys (FEEDING SUPPLEMENT, PRO-STAT SUGAR FREE 64,) LIQD Place 30 mLs into feeding tube 2 (two) times daily. Qty: 900 mL, Refills:  0    amoxicillin-clavulanate (AUGMENTIN) 875-125 MG tablet Take 1 tablet by mouth every 12 (twelve) hours. Qty: 20 tablet, Refills: 0    bisacodyl (DULCOLAX) 10 MG suppository Place 1 suppository (10 mg total) rectally daily as needed for moderate constipation. Qty: 12 suppository, Refills: 0    clotrimazole (LOTRIMIN) 1 % cream Apply topically 2 (two) times daily. Qty: 30 g, Refills: 0    Nutritional Supplements (FEEDING SUPPLEMENT, JEVITY 1.5 CAL/FIBER,) LIQD  Place 237 mLs into feeding tube daily. Qty: 1500 mL, Refills: 2    Water For Irrigation, Sterile (FREE WATER) SOLN Place 250 mLs into feeding tube 5 (five) times daily. Qty: 1000 mL, Refills: 0      CONTINUE these medications which have NOT CHANGED   Details  apixaban (ELIQUIS) 5 MG TABS tablet Take 5 mg by mouth 2 (two) times daily.     baclofen (LIORESAL) 20 MG tablet Take 20 mg by mouth 3 (three) times daily.    doxepin (SINEQUAN) 10 MG capsule Take 30 mg by mouth at bedtime.    gabapentin (NEURONTIN) 300 MG capsule Take 900 mg by mouth 3 (three) times daily.     indomethacin (INDOCIN SR) 75 MG CR capsule Take 75 mg by mouth daily with breakfast.    lacosamide (VIMPAT) 200 MG TABS tablet Take 200 mg by mouth daily.     lurasidone (LATUDA) 80 MG TABS tablet Take 40 mg by mouth 2 (two) times daily. Patient takes 1/2 tablet (40mg ) at 8 am and 3 pm    pantoprazole (PROTONIX) 40 MG tablet Take 40 mg by mouth 2 (two) times daily.    propranolol ER (INDERAL LA) 60 MG 24 hr capsule Take 1 capsule (60 mg total) by mouth daily. Qty: 30 capsule, Refills: 0    tiZANidine (ZANAFLEX) 2 MG tablet Take 2 mg by mouth at bedtime.    albuterol (PROVENTIL) (2.5 MG/3ML) 0.083% nebulizer solution Inhale 3 mLs into the lungs 3 (three) times daily. Qty: 120 mL, Refills: 3    Multiple Vitamins-Minerals (MULTIVITAMIN WITH MINERALS) tablet Take 1 tablet by mouth daily.    risedronate (ACTONEL) 35 MG tablet Take 35 mg by mouth every 7 (seven) days. with water on empty stomach, nothing by mouth or lie down for next 30 minutes. Take on Saturday.         DISCHARGE INSTRUCTIONS:   Follow-up with primary care physician in a week Continue wound care Chest physiotherapy twice a day Follow-up with pulmonology Dr. Sung AmabileSimonds in 2 weeks  DIET:  dysphagia diet with honey thick liquids  Continue Jevity feeding supplements and post that sugar-free    DISCHARGE CONDITION:  Fair  ACTIVITY:  Activity  as tolerated  OXYGEN:  Home Oxygen: No.   Oxygen Delivery: room air  DISCHARGE LOCATION:  home   If you experience worsening of your admission symptoms, develop shortness of breath, life threatening emergency, suicidal or homicidal thoughts you must seek medical attention immediately by calling 911 or calling your MD immediately  if symptoms less severe.  You Must read complete instructions/literature along with all the possible adverse reactions/side effects for all the Medicines you take and that have been prescribed to you. Take any new Medicines after you have completely understood and accpet all the possible adverse reactions/side effects.   Please note  You were cared for by a hospitalist during your hospital stay. If you have any questions about your discharge medications or the care you received while you were in the hospital  after you are discharged, you can call the unit and asked to speak with the hospitalist on call if the hospitalist that took care of you is not available. Once you are discharged, your primary care physician will handle any further medical issues. Please note that NO REFILLS for any discharge medications will be authorized once you are discharged, as it is imperative that you return to your primary care physician (or establish a relationship with a primary care physician if you do not have one) for your aftercare needs so that they can reassess your need for medications and monitor your lab values.     Today  Chief Complaint  Patient presents with  . Fever   Patient is feeling fine. No complaints. Wants to go home. Mom at bedside.  ROS:  CONSTITUTIONAL: Denies fevers, chills. Denies any fatigue, weakness.  EYES: Denies blurry vision, double vision, eye pain. EARS, NOSE, THROAT: Denies tinnitus, ear pain, hearing loss. RESPIRATORY: Denies cough, wheeze, shortness of breath.  CARDIOVASCULAR: Denies chest pain, palpitations, edema.  GASTROINTESTINAL:  Denies nausea, vomiting, diarrhea, abdominal pain. Has redness around the PEG site on the skin with maceration GENITOURINARY: Denies dysuria, hematuria. ENDOCRINE: Denies nocturia or thyroid problems. HEMATOLOGIC AND LYMPHATIC: Denies easy bruising or bleeding. SKIN: Denies rash or lesion. MUSCULOSKELETAL: Denies pain in neck, back, shoulder, knees, hips or arthritic symptoms.  NEUROLOGIC: Denies paralysis, paresthesias.  PSYCHIATRIC: Denies anxiety or depressive symptoms.   VITAL SIGNS:  Blood pressure 127/69, pulse 69, temperature 99.1 F (37.3 C), resp. rate 18, height 6' (1.829 m), weight 116 kg (255 lb 12.8 oz), SpO2 94 %.  I/O:    Intake/Output Summary (Last 24 hours) at 08/12/16 1430 Last data filed at 08/12/16 1025  Gross per 24 hour  Intake              800 ml  Output             1200 ml  Net             -400 ml    PHYSICAL EXAMINATION:  GENERAL:  31 y.o.-year-old patient lying in the bed with no acute distress.  EYES: Pupils equal, round, reactive to light and accommodation. No scleral icterus. Extraocular muscles intact.  HEENT: Head atraumatic, normocephalic. Oropharynx and nasopharynx clear.  NECK:  Supple, no jugular venous distention. No thyroid enlargement, no tenderness.  LUNGS: Normal breath sounds bilaterally, no wheezing, rales,rhonchi or crepitation. No use of accessory muscles of respiration.  CARDIOVASCULAR: S1, S2 normal. No murmurs, rubs, or gallops.  ABDOMEN: PEG site is intact skin is erythematous with maceration around the PEG tube site Soft, non-tender, non-distended. Bowel sounds present. No organomegaly or mass.  EXTREMITIES: No pedal edema, cyanosis, or clubbing.  NEUROLOGIC: Cranial nerves II through XII are intact. Muscle strengthat his baseline. He has chronic contractures PSYCHIATRIC: The patient is alert and oriented x 3.  SKIN: No obvious rash, lesion, or ulcer.   DATA REVIEW:   CBC  Recent Labs Lab 08/12/16 0701  WBC 6.7  HGB 12.9*   HCT 37.9*  PLT 237    Chemistries   Recent Labs Lab 08/08/16 1120 08/09/16 0445 08/12/16 0701  NA 132* 139  --   K 3.6 3.8  --   CL 99* 109  --   CO2 26 26  --   GLUCOSE 92 107*  --   BUN 14 10  --   CREATININE 0.85 0.73 0.65  CALCIUM 8.5* 7.9*  --   AST 15  --   --  ALT 14*  --   --   ALKPHOS 54  --   --   BILITOT 1.4*  --   --     Cardiac Enzymes No results for input(s): TROPONINI in the last 168 hours.  Microbiology Results  Results for orders placed or performed during the hospital encounter of 08/08/16  Blood Culture (routine x 2)     Status: None (Preliminary result)   Collection Time: 08/08/16 11:21 AM  Result Value Ref Range Status   Specimen Description BLOOD RIGHT HAND  Final   Special Requests   Final    BOTTLES DRAWN AEROBIC AND ANAEROBIC AER ANA   Culture NO GROWTH 4 DAYS  Final   Report Status PENDING  Incomplete  Urine culture     Status: None   Collection Time: 08/08/16 11:21 AM  Result Value Ref Range Status   Specimen Description URINE, RANDOM  Final   Special Requests NONE  Final   Culture NO GROWTH Performed at [facility]   Final   Report Status 08/09/2016 FINAL  Final  Blood Culture (routine x 2)     Status: None (Preliminary result)   Collection Time: 08/08/16 11:30 AM  Result Value Ref Range Status   Specimen Description BLOOD RIGHT ARM  Final   Special Requests   Final    BOTTLES DRAWN AEROBIC AND ANAEROBIC AER ANA   Culture NO GROWTH 4 DAYS  Final   Report Status PENDING  Incomplete    RADIOLOGY:  No results found.  EKG:   Orders placed or performed during the hospital encounter of 08/08/16  . EKG 12-Lead  . EKG 12-Lead      Management plans discussed with the patient, family and they are in agreement.  CODE STATUS:     Code Status Orders        Start     Ordered   08/08/16 1817  Full code  Continuous     08/08/16 1817    Code Status History    Date Active Date Inactive Code Status  Order ID Comments User Context   06/19/2015  8:50 PM 06/25/2015  3:27 PM Full Code [id number]  Shaune Pollack, MD Inpatient   05/25/2015  6:23 AM 05/29/2015  5:03 PM Full Code [id number]  Oralia Manis, MD Inpatient   01/29/2015  3:57 PM 02/14/2015  1:10 PM Full Code [id number]  [name], MD Inpatient      TOTAL TIME TAKING CARE OF THIS PATIENT: 45  minutes.   Note: This dictation was prepared with Dragon dictation along with smaller phrase technology. Any transcriptional errors that result from this process are unintentional.   @[facility] @  on 08/12/2016 at 2:30 PM  Between 7am to 6pm - Pager - [phone]  After 6pm go to [email or web address] - password [email or web address] [facility]  [facility]  Office  [phone]  CC: Primary care physician; [name], MD

## 2016-08-12 NOTE — Care Management (Signed)
Patient admitted with Sepsis.  Hx TBI.  Patient lives at home with mother. CAPS services 5 days a week from 8 am - 11 pm.  Per CAPS worker patient has kangaroo pump,  Nebulizer, percussion vest, hospital bed, O2, bipap, electric WC in the home.  Plan to discharge home today with chronic services in place.  Will transport via EMS.

## 2016-08-13 LAB — CULTURE, BLOOD (ROUTINE X 2)
CULTURE: NO GROWTH
Culture: NO GROWTH

## 2016-08-22 ENCOUNTER — Encounter: Payer: BLUE CROSS/BLUE SHIELD | Admitting: Surgery

## 2016-08-22 DIAGNOSIS — R32 Unspecified urinary incontinence: Secondary | ICD-10-CM | POA: Diagnosis not present

## 2016-08-22 DIAGNOSIS — G40909 Epilepsy, unspecified, not intractable, without status epilepticus: Secondary | ICD-10-CM | POA: Diagnosis not present

## 2016-08-22 DIAGNOSIS — B1920 Unspecified viral hepatitis C without hepatic coma: Secondary | ICD-10-CM | POA: Diagnosis not present

## 2016-08-22 DIAGNOSIS — X58XXXD Exposure to other specified factors, subsequent encounter: Secondary | ICD-10-CM | POA: Diagnosis not present

## 2016-08-22 DIAGNOSIS — G8220 Paraplegia, unspecified: Secondary | ICD-10-CM | POA: Diagnosis not present

## 2016-08-22 DIAGNOSIS — Z79899 Other long term (current) drug therapy: Secondary | ICD-10-CM | POA: Diagnosis not present

## 2016-08-22 DIAGNOSIS — Z87820 Personal history of traumatic brain injury: Secondary | ICD-10-CM | POA: Diagnosis not present

## 2016-08-22 DIAGNOSIS — S31501D Unspecified open wound of unspecified external genital organs, male, subsequent encounter: Secondary | ICD-10-CM | POA: Diagnosis not present

## 2016-08-22 DIAGNOSIS — Z86711 Personal history of pulmonary embolism: Secondary | ICD-10-CM | POA: Diagnosis not present

## 2016-08-22 DIAGNOSIS — D649 Anemia, unspecified: Secondary | ICD-10-CM | POA: Diagnosis not present

## 2016-08-22 DIAGNOSIS — Z7901 Long term (current) use of anticoagulants: Secondary | ICD-10-CM | POA: Diagnosis not present

## 2016-08-22 DIAGNOSIS — Z87891 Personal history of nicotine dependence: Secondary | ICD-10-CM | POA: Diagnosis not present

## 2016-08-23 ENCOUNTER — Ambulatory Visit (INDEPENDENT_AMBULATORY_CARE_PROVIDER_SITE_OTHER): Payer: BLUE CROSS/BLUE SHIELD | Admitting: Pulmonary Disease

## 2016-08-23 ENCOUNTER — Encounter: Payer: Self-pay | Admitting: Pulmonary Disease

## 2016-08-23 VITALS — BP 126/84 | HR 57

## 2016-08-23 DIAGNOSIS — S069X9S Unspecified intracranial injury with loss of consciousness of unspecified duration, sequela: Secondary | ICD-10-CM | POA: Diagnosis not present

## 2016-08-23 DIAGNOSIS — IMO0002 Reserved for concepts with insufficient information to code with codable children: Secondary | ICD-10-CM

## 2016-08-23 DIAGNOSIS — G4731 Primary central sleep apnea: Secondary | ICD-10-CM | POA: Diagnosis not present

## 2016-08-23 DIAGNOSIS — G4733 Obstructive sleep apnea (adult) (pediatric): Secondary | ICD-10-CM | POA: Diagnosis not present

## 2016-08-23 DIAGNOSIS — J690 Pneumonitis due to inhalation of food and vomit: Secondary | ICD-10-CM | POA: Diagnosis not present

## 2016-08-23 NOTE — Progress Notes (Addendum)
[name] [name], Jawaun ([id number]) Visit Report for 08/22/2016 Chief Complaint Document Details Patient Name: [name] [name], [name] [name] Date of Service: 08/22/2016 11:00 AM Medical Record Number: [id number] Patient Account Number: [id number] Date of Birth/Sex: 06/28/1986 (30 y.o. Male) Treating RN: Phillis HaggisPinkerton, Debi Primary Care Provider: Bethann PunchesMiller, Mark Other Clinician: Referring Provider: Bethann PunchesMiller, Mark Treating Provider/Extender: Rudene ReBritto, Almendra Loria Weeks in Treatment: 2 Information Obtained from: Caregiver Chief Complaint he returns with an ulceration to his right groin which has been fairly superficial for about couple of weeks. Electronic Signature(s) Signed: 08/22/2016 11:53:44 AM By: Evlyn KannerBritto, Malcome Ambrocio MD, FACS Entered By: Evlyn KannerBritto, Breniyah Romm on 08/22/2016 11:53:44 [name] [name], Hanzel ([id number]) -------------------------------------------------------------------------------- HPI Details Patient Name: [name] [name], [name] [name] Date of Service: 08/22/2016 11:00 AM Medical Record Number: [id number] Patient Account Number: [id number] Date of Birth/Sex: 05/20/1986 (30 y.o. Male) Treating RN: Phillis HaggisPinkerton, Debi Primary Care Provider: Bethann PunchesMiller, Mark Other Clinician: Referring Provider: Bethann PunchesMiller, Mark Treating Provider/Extender: Rudene ReBritto, Arnette Driggs Weeks in Treatment: 2 History of Present Illness Location: right groin Quality: Patient reports No Pain. Severity: Patient states wound are getting worse. Duration: Patient has had the wound for > 1 months prior to seeking treatment at the wound center Context: The wound appeared gradually over time HPI Description: 31 year old patient who has traumatic brain injury was recently healed at the end of November 2017 for chronic right groin wounds. He continue conservative therapy but recently they noticed that deep down in his right groin and perineum he has a sligh superficial ulceration. Nothing else has changed in his health except that he has got a new wheelchair. 08/22/2016 -- I saw him last he had an aspiration  pneumonia and was admitted to the hospital and also had developed some cellulitis o flank. He has recovered from this. ====================================================================================================== Old notes: 31 year old male who has a history of traumatic brain injury and also has a ulcer on his left heel which was noted by the family about 4 ago. The TBI was in January 2013 and prior to this visit the patient has had hyperbaric oxygen therapy approximately 2 years after his He is had several gastrostomy tube placed by the gastroenterologist but then finally had a surgical procedure to bring the gastric muco skin surface. This was done approximately 9 months ago. Past medical history of hepatitis C, traumatic brain injury, pulmonary emboli, ileostomy, tracheostomy, PEG tube placement. Was a former smoker and quit in January 2013 He known to see us for various issues with wounds and this time around for about a month he has open areas near his groin possibly d contracture. No history of fungal infection of the groin. 04/04/2016 -- his caregiver says there is a lot of drainage and she has to often change it more than once a day because of the amount drainage in this area. May 16 2016 -- the caregiver says that is continuous drainage which increases the redness and then he has multiple small linear superf ulcerations. 06-27-16 [name] presents today accompanied by his caregiver for follow-up. The caregiver denies any issues or changes since the las appointment and admits to using nystatin powder to bilateral groins at bedtime and wicking moisture during the day with drawtex. The c also admits to attempts and dietary changes in an effort to reduce caloric intake and achieve weight loss. ====================================================================================================== Electronic Signature(s) Signed: 08/22/2016 11:54:49 AM By: Evlyn KannerBritto, Mikeal Winstanley MD,  FACS Entered By: Evlyn KannerBritto, [name] [name] on 08/22/2016 11:54:48 [name] [name], Lois ([id number]) -------------------------------------------------------------------------------- Physical Exam Details Patient Name: [name] [name], [name] [name] Date of Service: 08/22/2016 11:00 AM Medical Record Number: [id number] Patient Account Number: [id number] Date  of Birth/Sex: 1985-10-10 (31 y.o. Male) Treating RN: Phillis Haggis Primary Care Provider: Bethann Punches Other Clinician: Referring Provider: Bethann Punches Treating Provider/Extender: Rudene Re in Treatment: 2 Constitutional . Pulse regular. Respirations normal and unlabored. Afebrile. . Eyes Nonicteric. Reactive to light. Ears, Nose, Mouth, and Throat Lips, teeth, and gums WNL.Marland Kitchen Moist mucosa without lesions. Neck supple and nontender. No palpable supraclavicular or cervical adenopathy. Normal sized without goiter. Respiratory WNL. No retractions.. Breath sounds WNL, No rubs, rales, rhonchi, or wheeze.. Cardiovascular Heart rhythm and rate regular, no murmur or gallop.. Pedal Pulses WNL. No clubbing, cyanosis or edema. Lymphatic No adneopathy. No adenopathy. No adenopathy. Musculoskeletal Adexa without tenderness or enlargement.. Digits and nails w/o clubbing, cyanosis, infection, petechiae, ischemia, or inflammatory con Integumentary (Hair, Skin) No suspicious lesions. No crepitus or fluctuance. No peri-wound warmth or erythema. No masses.Marland Kitchen Psychiatric Judgement and insight Intact.. No evidence of depression, anxiety, or agitation.. Notes The right groin and perineum has a few superficial ulcerations with some fungal infection and there is no evidence of cellulitis. No sharp debridement was required. Electronic Signature(s) Signed: 08/22/2016 11:55:25 AM By: Evlyn Kanner MD, FACS Entered By: Evlyn Kanner on 08/22/2016 11:55:24 [name] [name] ([id number]) -------------------------------------------------------------------------------- Physician Orders  Details Patient Name: [name] [name] Date of Service: 08/22/2016 11:00 AM Medical Record Number: [id number] Patient Account Number: [id number] Date of Birth/Sex: 1986/03/12 (30 y.o. Male) Treating RN: Phillis Haggis Primary Care Provider: Bethann Punches Other Clinician: Referring Provider: Bethann Punches Treating Provider/Extender: Rudene Re in Treatment: 2 Verbal / Phone Orders: Yes Clinician: Pinkerton, Debi Read Back and Verified: Yes Diagnosis Coding Wound Cleansing Wound #4 Right Groin o Clean wound with Normal Saline. o Cleanse wound with mild soap and water o May Shower, gently pat wound dry prior to applying new dressing. Anesthetic Wound #4 Right Groin o Topical Lidocaine 4% cream applied to wound bed prior to debridement Skin Barriers/Peri-Wound Care Wound #4 Right Groin o Antifungal powder-Nystatin Primary Wound Dressing Wound #4 Right Groin o Other: - nystatin Secondary Dressing Wound #4 Right Groin o Drawtex Dressing Change Frequency Wound #4 Right Groin o Change dressing twice daily. Follow-up Appointments Wound #4 Right Groin o Return Appointment in 2 weeks. Off-Loading Wound #4 Right Groin o Turn and reposition every 2 hours Additional Orders / Instructions Wound #4 Right Groin o Stop Smoking Electronic Signature(s) Signed: 08/22/2016 3:30:03 PM By: Evlyn Kanner MD, FACS Signed: 08/22/2016 3:37:28 PM By: Alejandro Mulling Entered By: Alejandro Mulling on 08/22/2016 11:45:46 [name] [name] ([id number]) -------------------------------------------------------------------------------- Problem List Details Patient Name: [name] [name] Date of Service: 08/22/2016 11:00 AM Medical Record Number: [id number] Patient Account Number: [id number] Date of Birth/Sex: [birth year] (30 y.o. Male) Treating RN: Phillis Haggis Primary Care Provider: Bethann Punches Other Clinician: Referring Provider: Bethann Punches Treating Provider/Extender: Rudene Re in Treatment: 2 Active Problems ICD-10 Encounter Code Description Active Date Diagnosis S31.501D Unspecified open wound of unspecified external genital organs, male, 08/05/2016 Yes subsequent encounter Z87.820 Personal history of traumatic brain injury 08/05/2016 Yes Inactive Problems Resolved Problems Electronic Signature(s) Signed: 08/22/2016 11:53:34 AM By: Evlyn Kanner MD, FACS Entered By: Evlyn Kanner on 08/22/2016 11:53:33 [name] [name] ([id number]) -------------------------------------------------------------------------------- Progress Note Details Patient Name: [name] [name] Date of Service: 08/22/2016 11:00 AM Medical Record Number: [id number] Patient Account Number: [id number] Date of Birth/Sex: 09-06-1985 (30 y.o. Male) Treating RN: Phillis Haggis Primary Care Provider: Bethann Punches Other Clinician: Referring Provider: Bethann Punches Treating Provider/Extender: Rudene Re in Treatment: 2 Subjective Chief Complaint Information obtained from Caregiver he returns with an ulceration  to his right groin which has been fairly superficial for about couple of weeks. History of Present Illness (HPI) The following HPI elements were documented for the patient's wound: Location: right groin Quality: Patient reports No Pain. Severity: Patient states wound are getting worse. Duration: Patient has had the wound for > 1 months prior to seeking treatment at the wound center Context: The wound appeared gradually over time 31 year old patient who has traumatic brain injury was recently healed at the end of November 2017 for chronic right and left groin wou continue conservative therapy but recently they noticed that deep down in his right groin and perineum he has a slight open superficial Nothing else has changed in his health except that he has got a new wheelchair. 08/22/2016 -- I saw him last he had an aspiration pneumonia and was admitted to the hospital and also  had developed some cellulitis o flank. He has recovered from this. ==== Old notes: 31 year old male who has a history of traumatic brain injury and also has a ulcer on his left heel which was noted by the family about 4 ago. The TBI was in January 2013 and prior to this visit the patient has had hyperbaric oxygen therapy approximately 2 years after his He is had several gastrostomy tube placed by the gastroenterologist but then finally had a surgical procedure to bring the gastric muco skin surface. This was done approximately 9 months ago. Past medical history of hepatitis C, traumatic brain injury, pulmonary emboli, ileostomy, tracheostomy, PEG tube placement. Was a former smoker and quit in January 2013 He known to see [place] for various issues with wounds and this time around for about a month he has open areas near his groin possibly d contracture. No history of fungal infection of the groin. 04/04/2016 -- his caregiver says there is a lot of drainage and she has to often change it more than once a day because of the amount drainage in this area. May 16 2016 -- the caregiver says that is continuous drainage which increases the redness and then he has multiple small linear superf ulcerations. 06-27-16 Mr. Releford presents today accompanied by his caregiver for follow-up. The caregiver denies any issues or changes since the las appointment and admits to using nystatin powder to bilateral groins at bedtime and wicking moisture during the day with drawtex. The c also admits to attempts and dietary changes in an effort to reduce caloric intake and achieve weight loss. ========================== Objective Constitutional Pulse regular. Respirations normal and unlabored. Afebrile. ROSEMARY, PENTECOST ([id number]) Vitals Time Taken: 11:15 AM, Height: 72 in, Weight: 230 lbs, BMI: 31.2, Temperature: 98.1 F, Pulse: 65 bpm, Respiratory Rate: 16 bre Blood Pressure: 116/70 mmHg. Eyes Nonicteric. Reactive to  light. Ears, Nose, Mouth, and Throat Lips, teeth, and gums WNL.Marland Kitchen Moist mucosa without lesions. Neck supple and nontender. No palpable supraclavicular or cervical adenopathy. Normal sized without goiter. Respiratory WNL. No retractions.. Breath sounds WNL, No rubs, rales, rhonchi, or wheeze.. Cardiovascular Heart rhythm and rate regular, no murmur or gallop.. Pedal Pulses WNL. No clubbing, cyanosis or edema. Lymphatic No adneopathy. No adenopathy. No adenopathy. Musculoskeletal Adexa without tenderness or enlargement.. Digits and nails w/o clubbing, cyanosis, infection, petechiae, ischemia, or inflammatory con Psychiatric Judgement and insight Intact.. No evidence of depression, anxiety, or agitation.. General Notes: The right groin and perineum has a few superficial ulcerations with some fungal infection and there is no evidence of ce sharp debridement was required. Integumentary (Hair, Skin) No suspicious lesions. No crepitus or  fluctuance. No peri-wound warmth or erythema. No masses.. Assessment Active Problems ICD-10 S31.501D - Unspecified open wound of unspecified external genital organs, male, subsequent encounter Z87.820 - Personal history of traumatic brain injury Plan Wound Cleansing: Wound #4 Right Groin: Clean wound with Normal Saline. Cleanse wound with mild soap and water May Shower, gently pat wound dry prior to applying new dressing. Anesthetic: Wound #4 Right Groin: Topical Lidocaine 4% cream applied to wound bed prior to debridement Skin Barriers/Peri-Wound Care: Wound #4 Right Groin: VAMSI, APFEL ([id number]) Antifungal powder-Nystatin Primary Wound Dressing: Wound #4 Right Groin: Other: - nystatin Secondary Dressing: Wound #4 Right Groin: Drawtex Dressing Change Frequency: Wound #4 Right Groin: Change dressing twice daily. Follow-up Appointments: Wound #4 Right Groin: Return Appointment in 2 weeks. Off-Loading: Wound #4 Right Groin: Turn and  reposition every 2 hours Additional Orders / Instructions: Wound #4 Right Groin: Stop Smoking I have recommended we continue with Nystatin powder and drawtex to be change as frequently as need be. We discussed offloading, adequate vitamins including vitamin A, vitamin C and zinc have been discussed with them. He will be back for review soon. Electronic Signature(s) Signed: 09/27/2016 2:23:36 PM By: Elliot Gurney RN, BSN, Kim RN, BSN Signed: 09/27/2016 3:39:32 PM By: Evlyn Kanner MD, FACS Previous Signature: 08/22/2016 11:56:34 AM Version By: Evlyn Kanner MD, FACS Entered By: Elliot Gurney RN, BSN, Kim on 09/27/2016 14:23:36 [name] [name] ([id number]) -------------------------------------------------------------------------------- SuperBill Details Patient Name: [name] [name] Date of Service: 08/22/2016 Medical Record Number: [id number] Patient Account Number: [id number] Date of Birth/Sex: 22-May-1986 (30 y.o. Male) Treating RN: Phillis Haggis Primary Care Provider: Bethann Punches Other Clinician: Referring Provider: Bethann Punches Treating Provider/Extender: Evlyn Kanner Service Line: Outpatient Weeks in Treatment: 2 Diagnosis Coding ICD-10 Codes Code Description S31.501D Unspecified open wound of unspecified external genital organs, male, subsequent encounter Z87.820 Personal history of traumatic brain injury Facility Procedures CPT4 Code: 08657846 Description: [id number] - WOUND CARE VISIT-LEV 3 EST PT Modifier: Q: Physician Procedures CPT4 Code: [id number] Description: 99213 - WC PHYS LEVEL 3 - EST PT ICD-10 Description Diagnosis S31.501D Unspecified open wound of unspecified external genital organs, male, subsequent e Z87.820 Personal history of traumatic brain injury Modifier: Acupuncturist) Signed: 08/22/2016 3:37:28 PM By: Alejandro Mulling Signed: 08/23/2016 4:12:12 PM By: Evlyn Kanner MD, FACS Previous Signature: 08/22/2016 11:56:43 AM Version By: Evlyn Kanner MD, FACS Entered By:  Alejandro Mulling on 08/22/2016 15:34:02

## 2016-08-23 NOTE — Progress Notes (Signed)
[name], [name] ([id number]) Visit Report for 08/22/2016 Arrival Information Details Patient Name: [name] [name], [name] [name] Date of Service: 08/22/2016 11:00 AM Medical Record Number: [id number] Patient Account Number: [id number] Date of Birth/Sex: [birth year] (30 y.o. Male) Treating RN: [name] [name] Primary Care [name] [name]: [name] [name] Other Clinician: Referring [name] [name]: [name] [name] Treating [name] [name]/Extender: [name] [name] in Treatment: 2 Visit Information History Since Last Visit All ordered tests and consults were completed: No Patient Arrived: Wheel Chair Added or deleted any medications: No Arrival Time: 11:14 Any new allergies or adverse reactions: No Accompanied By: caregiver Had a fall or experienced change in No activities of daily living that may affect Transfer Assistance: Harrel Lemon Lift risk of falls: Patient Identification Verified: Yes Signs or symptoms of abuse/neglect since last No Secondary Verification Process Yes visito Completed: Hospitalized since last visit: No Patient Requires Transmission-Based No Has Dressing in Place as Prescribed: Yes Precautions: Pain Present Now: No Patient Has Alerts: No Electronic Signature(s) Signed: 08/22/2016 3:37:28 PM By: [name] [name] Entered By: [name] [name] on 08/22/2016 11:15:06 [name] [name] ([id number]) -------------------------------------------------------------------------------- Clinic Level of Care Assessment Details Patient Name: [name] [name] Date of Service: 08/22/2016 11:00 AM Medical Record Number: [id number] Patient Account Number: [id number] Date of Birth/Sex: 1985/11/20 (30 y.o. Male) Treating RN: [name] [name] Primary Care [name] [name]: [name] [name] Other Clinician: Referring [name] [name]: [name] [name] Treating [name] [name]/Extender: [name] [name] in Treatment: 2 Clinic Level of Care Assessment Items TOOL 4 Quantity Score X - Use when only an EandM is performed on FOLLOW-UP visit 1 0 ASSESSMENTS - Nursing  Assessment / Reassessment X - Reassessment of Co-morbidities (includes updates in patient status) 1 10 X - Reassessment of Adherence to Treatment Plan 1 5 ASSESSMENTS - Wound and Skin Assessment / Reassessment X - Simple Wound Assessment / Reassessment - one wound 1 5 '[]'$  - Complex Wound Assessment / Reassessment - multiple wounds 0 '[]'$  - Dermatologic / Skin Assessment (not related to wound area) 0 ASSESSMENTS - Focused Assessment '[]'$  - Circumferential Edema Measurements - multi extremities 0 '[]'$  - Nutritional Assessment / Counseling / Intervention 0 '[]'$  - Lower Extremity Assessment (monofilament, tuning fork, pulses) 0 '[]'$  - Peripheral Arterial Disease Assessment (using hand held doppler) 0 ASSESSMENTS - Ostomy and/or Continence Assessment and Care '[]'$  - Incontinence Assessment and Management 0 '[]'$  - Ostomy Care Assessment and Management (repouching, etc.) 0 PROCESS - Coordination of Care '[]'$  - Simple Patient / Family Education for ongoing care 0 X - Complex (extensive) Patient / Family Education for ongoing care 1 20 '[]'$  - Staff obtains Programmer, systems, Records, Test Results / Process Orders 0 '[]'$  - Staff telephones HHA, Nursing Homes / Clarify orders / etc 0 '[]'$  - Routine Transfer to another Facility (non-emergent condition) 0 [name] [name], [name] [name] ([id number]) '[]'$  - Routine Hospital Admission (non-emergent condition) 0 '[]'$  - New Admissions / Biomedical engineer / Ordering NPWT, Apligraf, etc. 0 '[]'$  - Emergency Hospital Admission (emergent condition) 0 X - Simple Discharge Coordination 1 10 '[]'$  - Complex (extensive) Discharge Coordination 0 PROCESS - Special Needs '[]'$  - Pediatric / Minor Patient Management 0 '[]'$  - Isolation Patient Management 0 '[]'$  - Hearing / Language / Visual special needs 0 '[]'$  - Assessment of Community assistance (transportation, D/C planning, etc.) 0 '[]'$  - Additional assistance / Altered mentation 0 '[]'$  - Support Surface(s) Assessment (bed, cushion, seat, etc.) 0 INTERVENTIONS - Wound  Cleansing / Measurement X - Simple Wound Cleansing - one wound 1 5 '[]'$  - Complex Wound Cleansing - multiple wounds 0 X - Wound Imaging (photographs - any number of  wounds) 1 5 '[]'$  - Wound Tracing (instead of photographs) 0 X - Simple Wound Measurement - one wound 1 5 '[]'$  - Complex Wound Measurement - multiple wounds 0 INTERVENTIONS - Wound Dressings X - Small Wound Dressing one or multiple wounds 1 10 '[]'$  - Medium Wound Dressing one or multiple wounds 0 '[]'$  - Large Wound Dressing one or multiple wounds 0 X - Application of Medications - topical 1 5 '[]'$  - Application of Medications - injection 0 INTERVENTIONS - Miscellaneous '[]'$  - External ear exam 0 [name] [name], [name] [name] ([id number]) '[]'$  - Specimen Collection (cultures, biopsies, blood, body fluids, etc.) 0 '[]'$  - Specimen(s) / Culture(s) sent or taken to Lab for analysis 0 '[]'$  - Patient Transfer (multiple staff / Harrel Lemon Lift / Similar devices) 0 '[]'$  - Simple Staple / Suture removal (25 or less) 0 '[]'$  - Complex Staple / Suture removal (26 or more) 0 '[]'$  - Hypo / Hyperglycemic Management (close monitor of Blood Glucose) 0 '[]'$  - Ankle / Brachial Index (ABI) - do not check if billed separately 0 X - Vital Signs 1 5 Has the patient been seen at the hospital within the last three years: Yes Total Score: 85 Level Of Care: New/Established - Level 3 Electronic Signature(s) Signed: 08/22/2016 3:37:28 PM By: [name] [name] Entered By: [name] [name] on 08/22/2016 15:33:49 [name] [name] ([id number]) -------------------------------------------------------------------------------- Encounter Discharge Information Details Patient Name: [name] [name] Date of Service: 08/22/2016 11:00 AM Medical Record Number: [id number] Patient Account Number: [id number] Date of Birth/Sex: 04-Jan-1986 (30 y.o. Male) Treating RN: [name] [name] Primary Care [name] [name]: [name] [name] Other Clinician: Referring [name] [name]: [name] [name] Treating [name] [name]/Extender: [name] [name] in  Treatment: 2 Encounter Discharge Information Items Discharge Pain Level: 0 Discharge Condition: Stable Ambulatory Status: Wheelchair Discharge Destination: Home Transportation: Private Auto Accompanied By: caregiver Schedule Follow-up Appointment: Yes Medication Reconciliation completed and provided to Patient/Care Yes Arianah Torgeson: Provided on Clinical Summary of Care: 08/22/2016 Form Type Recipient Paper Patient ZI Electronic Signature(s) Signed: 08/22/2016 11:49:46 AM By: Ruthine Dose Entered By: Ruthine Dose on 08/22/2016 11:49:45 [name] [name] ([id number]) -------------------------------------------------------------------------------- Lower Extremity Assessment Details Patient Name: [name] [name] Date of Service: 08/22/2016 11:00 AM Medical Record Number: [id number] Patient Account Number: [id number] Date of Birth/Sex: 09/30/1985 (30 y.o. Male) Treating RN: [name] [name] Primary Care Webber Michiels: [name] [name] Other Clinician: Referring Deepa Barthel: [name] [name] Treating Shirleen Mcfaul/Extender: [name] [name] in Treatment: 2 Electronic Signature(s) Signed: 08/22/2016 3:37:28 PM By: [name] [name] Entered By: [name] [name] on 08/22/2016 11:18:28 [name] [name] ([id number]) -------------------------------------------------------------------------------- Multi Wound Chart Details Patient Name: [name] [name] Date of Service: 08/22/2016 11:00 AM Medical Record Number: [id number] Patient Account Number: [id number] Date of Birth/Sex: 12-22-1985 (30 y.o. Male) Treating RN: [name] [name] Primary Care Willowdean Luhmann: [name] [name] Other Clinician: Referring Chanya Chrisley: [name] [name] Treating Debera Sterba/Extender: [name] [name] in Treatment: 2 Vital Signs Height(in): 72 Pulse(bpm): 65 Weight(lbs): 230 Blood Pressure 116/70 (mmHg): Body Mass Index(BMI): 31 Temperature(F): 98.1 Respiratory Rate 16 (breaths/min): Photos: [4:No Photos] [N/A:N/A] Wound Location: [4:Right Groin]  [N/A:N/A] Wounding Event: [4:Gradually Appeared] [N/A:N/A] Primary Etiology: [4:To be determined] [N/A:N/A] Comorbid History: [4:Anemia, Hepatitis C, History of pressure wounds, Paraplegia, Seizure Disorder] [N/A:N/A] Date Acquired: [4:07/22/2016] [N/A:N/A] Weeks of Treatment: [4:2] [N/A:N/A] Wound Status: [4:Open] [N/A:N/A] Measurements L x W x D 0.5x0.3x0.1 [N/A:N/A] (cm) Area (cm) : [4:0.118] [N/A:N/A] Volume (cm) : [4:0.012] [N/A:N/A] % Reduction in Area: [4:37.20%] [N/A:N/A] % Reduction in Volume: 36.80% [N/A:N/A] Classification: [4:Partial Thickness] [N/A:N/A] Exudate Amount: [4:Large] [N/A:N/A] Exudate Type: [4:Serous] [N/A:N/A] Exudate Color: [4:amber] [N/A:N/A] Wound Margin: [4:Distinct, outline attached] [N/A:N/A] Granulation Amount: [  4:Large (67-100%)] [N/A:N/A] Granulation Quality: [4:Red] [N/A:N/A] Necrotic Amount: [4:None Present (0%)] [N/A:N/A] Exposed Structures: [4:Fascia: No Fat Layer (Subcutaneous Tissue) Exposed: No Tendon: No Muscle: No] [N/A:N/A] Joint: No Bone: No Limited to Skin Breakdown Epithelialization: None N/A N/A Periwound Skin Texture: No Abnormalities Noted N/A N/A Periwound Skin No Abnormalities Noted N/A N/A Moisture: Periwound Skin Color: No Abnormalities Noted N/A N/A Temperature: No Abnormality N/A N/A Tenderness on Yes N/A N/A Palpation: Wound Preparation: Ulcer Cleansing: N/A N/A Rinsed/Irrigated with Saline Topical Anesthetic Applied: Other: lidocaine 4% Treatment Notes Wound #4 (Right Groin) 1. Cleansed with: Clean wound with Normal Saline 2. Anesthetic Topical Lidocaine 4% cream to wound bed prior to debridement 3. Peri-wound Care: Antifungal powder Notes drawtex Electronic Signature(s) Signed: 08/22/2016 11:53:39 AM By: Christin Fudge MD, FACS Entered By: Christin Fudge on 08/22/2016 11:53:38 [name] [name] ([id number]) -------------------------------------------------------------------------------- Multi-Disciplinary  Care Plan Details Patient Name: [name] [name] Date of Service: 08/22/2016 11:00 AM Medical Record Number: [id number] Patient Account Number: [id number] Date of Birth/Sex: [birth year] (30 y.o. Male) Treating RN: [name] [name] Primary Care Merrell Rettinger: [name] [name] Other Clinician: Referring Viet Kemmerer: [name] [name] Treating Lamari Youngers/Extender: [name] [name] in Treatment: 2 Active Inactive ` Abuse / Safety / Falls / Self Care Management Nursing Diagnoses: Potential for falls Goals: Patient will remain injury free Date Initiated: 08/05/2016 Target Resolution Date: 10/26/2016 Goal Status: Active Interventions: Assess fall risk on admission and as needed Notes: ` Orientation to the Wound Care Program Nursing Diagnoses: Knowledge deficit related to the wound healing center program Goals: Patient/caregiver will verbalize understanding of the [facility] Date Initiated: 08/05/2016 Target Resolution Date: 10/26/2016 Goal Status: Active Interventions: Provide education on orientation to the wound center Notes: ` Pain, Acute or Chronic Nursing Diagnoses: Pain, acute or chronic: actual or potential Potential alteration in comfort, pain Rhome, Alroy Dust ([id number]) Goals: Patient will verbalize adequate pain control and receive pain control interventions during procedures as needed Date Initiated: 08/05/2016 Target Resolution Date: 10/26/2016 Goal Status: Active Patient/caregiver will verbalize adequate pain control between visits Date Initiated: 08/05/2016 Target Resolution Date: 10/26/2016 Goal Status: Active Patient/caregiver will verbalize comfort level met Date Initiated: 08/05/2016 Target Resolution Date: 10/26/2016 Goal Status: Active Interventions: Assess comfort goal upon admission Notes: ` Wound/Skin Impairment Nursing Diagnoses: Impaired tissue integrity Knowledge deficit related to ulceration/compromised skin integrity Goals: Ulcer/skin breakdown will have  a volume reduction of 30% by week 4 Date Initiated: 08/05/2016 Target Resolution Date: 10/26/2016 Goal Status: Active Ulcer/skin breakdown will have a volume reduction of 50% by week 8 Date Initiated: 08/05/2016 Target Resolution Date: 10/26/2016 Goal Status: Active Ulcer/skin breakdown will have a volume reduction of 80% by week 12 Date Initiated: 08/05/2016 Target Resolution Date: 10/26/2016 Goal Status: Active Interventions: Assess patient/caregiver ability to obtain necessary supplies Assess patient/caregiver ability to perform ulcer/skin care regimen upon admission and as needed Notes: Electronic Signature(s) Signed: 08/22/2016 3:37:28 PM By: [name] [name] Entered By: [name] [name] on 08/22/2016 11:29:51 [name] [name] ([id number]) [name] [name] ([id number]) -------------------------------------------------------------------------------- Pain Assessment Details Patient Name: [name] [name] Date of Service: 08/22/2016 11:00 AM Medical Record Number: [id number] Patient Account Number: [id number] Date of Birth/Sex: 1985/10/17 (30 y.o. Male) Treating RN: [name] [name] Primary Care Mavin Dyke: [name] [name] Other Clinician: Referring Tarynn Garling: [name] [name] Treating Shawnna Pancake/Extender: [name] [name] in Treatment: 2 Active Problems Location of Pain Severity and Description of Pain Patient Has Paino No Site Locations With Dressing Change: No Pain Management and Medication Current Pain Management: Electronic Signature(s) Signed: 08/22/2016 3:37:28 PM By: [name] [name] Entered By: [name] [name] on 08/22/2016  11:15:13 DAVY, WESTMORELAND ([id number]) -------------------------------------------------------------------------------- Patient/Caregiver Education Details Patient Name: [name] [name] Date of Service: 08/22/2016 11:00 AM Medical Record Number: [id number] Patient Account Number: [id number] Date of Birth/Gender: [birth year] (30 y.o. Male) Treating RN: [name] [name] Primary Care  Physician: [name] [name] Other Clinician: Referring Physician: Emily [name] Treating Physician/Extender: [name] [name] in Treatment: 2 Education Assessment Education Provided To: Patient Education Topics Provided Wound/Skin Impairment: Handouts: Other: change dressing as ordered Methods: Demonstration, Explain/Verbal Responses: State content correctly Electronic Signature(s) Signed: 08/22/2016 3:37:28 PM By: [name] [name] Entered By: [name] [name] on 08/22/2016 11:33:32 [name] [name] ([id number]) -------------------------------------------------------------------------------- Wound Assessment Details Patient Name: [name] [name] Date of Service: 08/22/2016 11:00 AM Medical Record Number: [id number] Patient Account Number: [id number] Date of Birth/Sex: 09/19/1985 (30 y.o. Male) Treating RN: [name] [name] Primary Care Amaru Burroughs: [name] [name] Other Clinician: Referring Etsuko Dierolf: [name] [name] Treating Anissia Wessells/Extender: [name] [name] in Treatment: 2 Wound Status Wound Number: 4 Primary To be determined Etiology: Wound Location: Right Groin Wound Open Wounding Event: Gradually Appeared Status: Date Acquired: 07/22/2016 Comorbid Anemia, Hepatitis C, History of Weeks Of Treatment: 2 History: pressure wounds, Paraplegia, Seizure Clustered Wound: No Disorder Photos Photo Uploaded By: [name] [name] on 08/22/2016 13:02:34 Wound Measurements Length: (cm) 0.5 Width: (cm) 0.3 Depth: (cm) 0.1 Area: (cm) 0.118 Volume: (cm) 0.012 % Reduction in Area: 37.2% % Reduction in Volume: 36.8% Epithelialization: None Tunneling: No Undermining: No Wound Description Classification: Partial Thickness Wound Margin: Distinct, outline attached Exudate Amount: Large Exudate Type: Serous Exudate Color: amber Foul Odor After Cleansing: No Wound Bed Granulation Amount: Large (67-100%) Exposed Structure Granulation Quality: Red Fascia Exposed: No Necrotic Amount: None  Present (0%) Fat Layer (Subcutaneous Tissue) Exposed: No Tendon Exposed: No Sakai, Dillen ([id number]) Muscle Exposed: No Joint Exposed: No Bone Exposed: No Limited to Skin Breakdown Periwound Skin Texture Texture Color No Abnormalities Noted: No No Abnormalities Noted: No Moisture Temperature / Pain No Abnormalities Noted: No Temperature: No Abnormality Tenderness on Palpation: Yes Wound Preparation Ulcer Cleansing: Rinsed/Irrigated with Saline Topical Anesthetic Applied: Other: lidocaine 4%, Treatment Notes Wound #4 (Right Groin) 1. Cleansed with: Clean wound with Normal Saline 2. Anesthetic Topical Lidocaine 4% cream to wound bed prior to debridement 3. Peri-wound Care: Antifungal powder Notes drawtex Electronic Signature(s) Signed: 08/22/2016 3:37:28 PM By: [name] [name] Entered By: [name] [name] on 08/22/2016 11:26:43 [name] [name] ([id number]) -------------------------------------------------------------------------------- Vitals Details Patient Name: [name] [name] Date of Service: 08/22/2016 11:00 AM Medical Record Number: [id number] Patient Account Number: [id number] Date of Birth/Sex: 1985-12-29 (30 y.o. Male) Treating RN: [name] [name] Primary Care Johnatha Zeidman: [name] [name] Other Clinician: Referring Ezri Fanguy: [name] [name] Treating Tangi Shroff/Extender: [name] [name] in Treatment: 2 Vital Signs Time Taken: 11:15 Temperature (F): 98.1 Height (in): 72 Pulse (bpm): 65 Weight (lbs): 230 Respiratory Rate (breaths/min): 16 Body Mass Index (BMI): 31.2 Blood Pressure (mmHg): 116/70 Reference Range: 80 - 120 mg / dl Electronic Signature(s) Signed: 08/22/2016 3:37:28 PM By: [name] [name] Entered By: [name] [name] on 08/22/2016 11:18:02

## 2016-08-23 NOTE — Patient Instructions (Signed)
We will obtain overnight oximetry, then decide if there are any changes to be be on the Trilogy machine  Continue rest as is and complete levofloxacin as prescribed  Follow up in 3 months or sooner as needed

## 2016-08-27 ENCOUNTER — Other Ambulatory Visit: Payer: Self-pay | Admitting: Student

## 2016-08-27 DIAGNOSIS — R509 Fever, unspecified: Secondary | ICD-10-CM

## 2016-08-27 DIAGNOSIS — R197 Diarrhea, unspecified: Secondary | ICD-10-CM

## 2016-08-27 DIAGNOSIS — R1032 Left lower quadrant pain: Secondary | ICD-10-CM

## 2016-08-27 DIAGNOSIS — R1031 Right lower quadrant pain: Secondary | ICD-10-CM

## 2016-08-28 ENCOUNTER — Other Ambulatory Visit
Admission: RE | Admit: 2016-08-28 | Discharge: 2016-08-28 | Disposition: A | Discharge status: Home / Self Care | Payer: BLUE CROSS/BLUE SHIELD | Source: Ambulatory Visit | Attending: Student | Admitting: Student

## 2016-08-28 DIAGNOSIS — R197 Diarrhea, unspecified: Secondary | ICD-10-CM | POA: Diagnosis not present

## 2016-08-28 LAB — GASTROINTESTINAL PANEL BY PCR, STOOL (REPLACES STOOL CULTURE)

## 2016-08-28 LAB — C DIFFICILE QUICK SCREEN W PCR REFLEX
C DIFFICILE (CDIFF) TOXIN: NEGATIVE
C DIFFICLE (CDIFF) ANTIGEN: NEGATIVE
C Diff interpretation: NOT DETECTED
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
## 2016-08-30 ENCOUNTER — Ambulatory Visit
Admission: RE | Admit: 2016-08-30 | Discharge: 2016-08-30 | Disposition: A | Discharge status: Home / Self Care | Payer: BLUE CROSS/BLUE SHIELD | Source: Ambulatory Visit | Attending: Student | Admitting: Student

## 2016-08-30 DIAGNOSIS — R197 Diarrhea, unspecified: Secondary | ICD-10-CM | POA: Insufficient documentation

## 2016-08-30 DIAGNOSIS — R1032 Left lower quadrant pain: Secondary | ICD-10-CM | POA: Diagnosis present

## 2016-08-30 DIAGNOSIS — Z931 Gastrostomy status: Secondary | ICD-10-CM | POA: Insufficient documentation

## 2016-08-30 DIAGNOSIS — R509 Fever, unspecified: Secondary | ICD-10-CM | POA: Diagnosis not present

## 2016-08-30 DIAGNOSIS — R1031 Right lower quadrant pain: Secondary | ICD-10-CM | POA: Diagnosis present

## 2016-08-30 MED ORDER — IOPAMIDOL (ISOVUE-300) INJECTION 61%
100.0000 mL | Freq: Once | INTRAVENOUS | Status: AC | PRN
  Administered 2016-08-30: 100 mL via INTRAVENOUS

## 2016-09-05 ENCOUNTER — Ambulatory Visit: Payer: BLUE CROSS/BLUE SHIELD

## 2016-09-12 ENCOUNTER — Encounter: Payer: BLUE CROSS/BLUE SHIELD | Attending: Surgery | Admitting: Surgery

## 2016-09-12 DIAGNOSIS — X58XXXD Exposure to other specified factors, subsequent encounter: Secondary | ICD-10-CM | POA: Diagnosis not present

## 2016-09-12 DIAGNOSIS — S31501D Unspecified open wound of unspecified external genital organs, male, subsequent encounter: Secondary | ICD-10-CM | POA: Diagnosis not present

## 2016-09-12 DIAGNOSIS — G8220 Paraplegia, unspecified: Secondary | ICD-10-CM | POA: Insufficient documentation

## 2016-09-12 DIAGNOSIS — R32 Unspecified urinary incontinence: Secondary | ICD-10-CM | POA: Diagnosis not present

## 2016-09-12 DIAGNOSIS — Z87820 Personal history of traumatic brain injury: Secondary | ICD-10-CM | POA: Diagnosis not present

## 2016-09-12 DIAGNOSIS — Z86711 Personal history of pulmonary embolism: Secondary | ICD-10-CM | POA: Diagnosis not present

## 2016-09-12 DIAGNOSIS — G40909 Epilepsy, unspecified, not intractable, without status epilepticus: Secondary | ICD-10-CM | POA: Diagnosis not present

## 2016-09-12 DIAGNOSIS — Z87891 Personal history of nicotine dependence: Secondary | ICD-10-CM | POA: Diagnosis not present

## 2016-09-12 DIAGNOSIS — D649 Anemia, unspecified: Secondary | ICD-10-CM | POA: Insufficient documentation

## 2016-09-12 DIAGNOSIS — Z7901 Long term (current) use of anticoagulants: Secondary | ICD-10-CM | POA: Insufficient documentation

## 2016-09-12 DIAGNOSIS — B1920 Unspecified viral hepatitis C without hepatic coma: Secondary | ICD-10-CM | POA: Insufficient documentation

## 2016-09-12 DIAGNOSIS — Z79899 Other long term (current) drug therapy: Secondary | ICD-10-CM | POA: Insufficient documentation

## 2016-09-12 NOTE — Progress Notes (Addendum)
[name] [name], [name] [name] ([id number]) Visit Report for 09/12/2016 Chief Complaint Document Details Patient Name: [name], [name] Date of Service: 09/12/2016 11:00 AM Medical Record Number: [id number] Patient Account Number: [id number] Date of Birth/Sex: 07-16-1986 (31 y.o. Male) Treating RN: Phillis Haggis Primary Care Provider: Bethann Punches Other Clinician: Referring Provider: Bethann Punches Treating Provider/Extender: Rudene Re in Treatment: 5 Information Obtained from: Caregiver Chief Complaint he returns with an ulceration to his right groin which has been fairly superficial for about couple of weeks. Electronic Signature(s) Signed: 09/12/2016 11:12:17 AM By: Evlyn Kanner MD, FACS Entered By: Evlyn Kanner on 09/12/2016 11:12:17 [name] [name] ([id number]) -------------------------------------------------------------------------------- HPI Details Patient Name: [name] [name] Date of Service: 09/12/2016 11:00 AM Medical Record Number: [id number] Patient Account Number: [id number] Date of Birth/Sex: Nov 12, 1985 (31 y.o. Male) Treating RN: Phillis Haggis Primary Care Provider: Bethann Punches Other Clinician: Referring Provider: Bethann Punches Treating Provider/Extender: Rudene Re in Treatment: 5 History of Present Illness Location: right groin Quality: Patient reports No Pain. Severity: Patient states wound are getting worse. Duration: Patient has had the wound for > 1 months prior to seeking treatment at the wound center Context: The wound appeared gradually over time HPI Description: 31 year old patient who has traumatic brain injury was recently healed at the end of November 2017 for chronic right groin wounds. He continue conservative therapy but recently they noticed that deep down in his right groin and perineum he has a sligh superficial ulceration. Nothing else has changed in his health except that he has got a new wheelchair. 08/22/2016 -- I saw him last he had an aspiration  pneumonia and was admitted to the hospital and also had developed some cellulitis o flank. He has recovered from this. 09/12/2016 -- since I saw him last it is been 3 weeks but he has recovered from his pneumonia and his caregiver says he has put on qu weight due to improper diet. ====================================================================================================== Old notes: 30 year old male who has a history of traumatic brain injury and also has a ulcer on his left heel which was noted by the family about 4 ago. The TBI was in January 2013 and prior to this visit the patient has had hyperbaric oxygen therapy approximately 2 years after his He is had several gastrostomy tube placed by the gastroenterologist but then finally had a surgical procedure to bring the gastric muco skin surface. This was done approximately 9 months ago. Past medical history of hepatitis C, traumatic brain injury, pulmonary emboli, ileostomy, tracheostomy, PEG tube placement. Was a former smoker and quit in January 2013 He known to see [place] for various issues with wounds and this time around for about a month he has open areas near his groin possibly d contracture. No history of fungal infection of the groin. 04/04/2016 -- his caregiver says there is a lot of drainage and she has to often change it more than once a day because of the amount drainage in this area. May 16 2016 -- the caregiver says that is continuous drainage which increases the redness and then he has multiple small linear superf ulcerations. 06-27-16 [name] [name] presents today accompanied by his caregiver for follow-up. The caregiver denies any issues or changes since the las appointment and admits to using nystatin powder to bilateral groins at bedtime and wicking moisture during the day with drawtex. The c also admits to attempts and dietary changes in an effort to reduce caloric intake and achieve weight  loss. ====================================================================================================== Electronic Signature(s) Signed: 09/12/2016 11:13:09 AM By: Evlyn Kanner MD, FACS  Entered By: Evlyn KannerBritto, Philipe Laswell on 09/12/2016 11:13:09 [name] HelperIRBY, [name] [name] ([id number]) -------------------------------------------------------------------------------- Physical Exam Details Patient Name: [name] [name] Date of Service: 09/12/2016 11:00 AM Medical Record Number: [id number] Patient Account Number: [id number] Date of Birth/Sex: 12/21/1985 (31 y.o. Male) Treating RN: Phillis HaggisPinkerton, Debi Primary Care Provider: Bethann PunchesMiller, Mark Other Clinician: Referring Provider: Bethann PunchesMiller, Mark Treating Provider/Extender: Rudene ReBritto, Abelino Tippin Weeks in Treatment: 5 Constitutional . Pulse regular. Respirations normal and unlabored. Afebrile. . Eyes Nonicteric. Reactive to light. Ears, Nose, Mouth, and Throat Lips, teeth, and gums WNL.Marland Kitchen. Moist mucosa without lesions. Neck supple and nontender. No palpable supraclavicular or cervical adenopathy. Normal sized without goiter. Respiratory WNL. No retractions.. Cardiovascular Pedal Pulses WNL. No clubbing, cyanosis or edema. Chest Breasts symmetical and no nipple discharge.. Breast tissue WNL, no masses, lumps, or tenderness.. Lymphatic No adneopathy. No adenopathy. No adenopathy. Musculoskeletal Adexa without tenderness or enlargement.. Digits and nails w/o clubbing, cyanosis, infection, petechiae, ischemia, or inflammatory con Integumentary (Hair, Skin) No suspicious lesions. No crepitus or fluctuance. No peri-wound warmth or erythema. No masses.Marland Kitchen. Psychiatric Judgement and insight Intact.. No evidence of depression, anxiety, or agitation.. Notes the right groin has a few superficial ulcerations but the deeper ulcerations closer to his scrotum have completely healed. He does not h florid fungal infection. Electronic Signature(s) Signed: 09/12/2016 11:13:53 AM By: Evlyn KannerBritto, Jisele Price MD,  FACS Entered By: Evlyn KannerBritto, Amya Hlad on 09/12/2016 11:13:51 [name] HelperIRBY, [name] [name] ([id number]) -------------------------------------------------------------------------------- Physician Orders Details Patient Name: [name] HelperIRBY, [name] [name] Date of Service: 09/12/2016 11:00 AM Medical Record Number: [id number] Patient Account Number: [id number] Date of Birth/Sex: 04/18/1986 (30 y.o. Male) Treating RN: Phillis HaggisPinkerton, Debi Primary Care Provider: Bethann PunchesMiller, Mark Other Clinician: Referring Provider: Bethann PunchesMiller, Mark Treating Provider/Extender: Rudene ReBritto, Debara Kamphuis Weeks in Treatment: 5 Verbal / Phone Orders: Yes Clinician: Ashok CordiaPinkerton, Debi Read Back and Verified: Yes Diagnosis Coding ICD-10 Coding Code Description S31.501D Unspecified open wound of unspecified external genital organs, male, subsequent encounter Z87.820 Personal history of traumatic brain injury Wound Cleansing Wound #4 Right Groin o Clean wound with Normal Saline. o Cleanse wound with mild soap and water o May Shower, gently pat wound dry prior to applying new dressing. Anesthetic Wound #4 Right Groin o Topical Lidocaine 4% cream applied to wound bed prior to debridement Skin Barriers/Peri-Wound Care Wound #4 Right Groin o Antifungal powder-Nystatin Secondary Dressing Wound #4 Right Groin o Drawtex Dressing Change Frequency Wound #4 Right Groin o Change dressing every day. Follow-up Appointments Wound #4 Right Groin o Other: - 3 weeks Off-Loading Wound #4 Right Groin o Turn and reposition every 2 hours Additional Orders / Instructions Wound #4 Right Groin o Increase protein intake. Electronic Signature(s) Signed: 09/12/2016 4:07:08 PM By: Evlyn KannerBritto, Jonmichael Beadnell MD, FACS Signed: 09/13/2016 4:03:50 PM By: Alejandro MullingPinkerton, Debra Entered By: Alejandro MullingPinkerton, Debra on 09/12/2016 11:31:37 [name] HelperIRBY, [name] [name] ([id number]) -------------------------------------------------------------------------------- Problem List Details Patient Name: [name] HelperIRBY, Duvall Date of  Service: 09/12/2016 11:00 AM Medical Record Number: [id number] Patient Account Number: [id number] Date of Birth/Sex: 07/25/1986 (30 y.o. Male) Treating RN: Phillis HaggisPinkerton, Debi Primary Care Provider: Bethann PunchesMiller, Mark Other Clinician: Referring Provider: Bethann PunchesMiller, Mark Treating Provider/Extender: Rudene ReBritto, Antion Andres Weeks in Treatment: 5 Active Problems ICD-10 Encounter Code Description Active Date Diagnosis S31.501D Unspecified open wound of unspecified external genital organs, male, 08/05/2016 Yes subsequent encounter Z87.820 Personal history of traumatic brain injury 08/05/2016 Yes Inactive Problems Resolved Problems Electronic Signature(s) Signed: 09/13/2016 3:53:40 PM By: Evlyn KannerBritto, Lihanna Biever MD, FACS Signed: 09/13/2016 4:03:50 PM By: Alejandro MullingPinkerton, Debra Previous Signature: 09/12/2016 11:12:05 AM Version By: Evlyn KannerBritto, Cheryel Kyte MD, FACS Entered By: Alejandro MullingPinkerton, Debra on 09/12/2016 16:29:14 [name] HelperIRBY, [name] [name] ([id number]) -------------------------------------------------------------------------------- Progress Note Details  Patient Name: [name] [name], [name] [name] Date of Service: 09/12/2016 11:00 AM Medical Record Number: [id number] Patient Account Number: [id number] Date of Birth/Sex: Oct 13, 1985 (30 y.o. Male) Treating RN: Phillis Haggis Primary Care Provider: Bethann Punches Other Clinician: Referring Provider: Bethann Punches Treating Provider/Extender: Rudene Re in Treatment: 5 Subjective Chief Complaint Information obtained from Caregiver he returns with an ulceration to his right groin which has been fairly superficial for about couple of weeks. History of Present Illness (HPI) The following HPI elements were documented for the patient's wound: Location: right groin Quality: Patient reports No Pain. Severity: Patient states wound are getting worse. Duration: Patient has had the wound for > 1 months prior to seeking treatment at the wound center Context: The wound appeared gradually over time 31 year old patient who has  traumatic brain injury was recently healed at the end of November 2017 for chronic right and left groin wou continue conservative therapy but recently they noticed that deep down in his right groin and perineum he has a slight open superficial Nothing else has changed in his health except that he has got a new wheelchair. 08/22/2016 -- I saw him last he had an aspiration pneumonia and was admitted to the hospital and also had developed some cellulitis o flank. He has recovered from this. 09/12/2016 -- since I saw him last it is been 3 weeks but he has recovered from his pneumonia and his caregiver says he has put on qu weight due to improper diet. == Old notes: 31 year old male who has a history of traumatic brain injury and also has a ulcer on his left heel which was noted by the family about 4 ago. The TBI was in January 2013 and prior to this visit the patient has had hyperbaric oxygen therapy approximately 2 years after his He is had several gastrostomy tube placed by the gastroenterologist but then finally had a surgical procedure to bring the gastric muco skin surface. This was done approximately 9 months ago. Past medical history of hepatitis C, traumatic brain injury, pulmonary emboli, ileostomy, tracheostomy, PEG tube placement. Was a former smoker and quit in January 2013 He known to see [place] for various issues with wounds and this time around for about a month he has open areas near his groin possibly d contracture. No history of fungal infection of the groin. 04/04/2016 -- his caregiver says there is a lot of drainage and she has to often change it more than once a day because of the amount drainage in this area. May 16 2016 -- the caregiver says that is continuous drainage which increases the redness and then he has multiple small linear superf ulcerations. 06-27-16 Mr. Wiechman presents today accompanied by his caregiver for follow-up. The caregiver denies any issues or changes since the  las appointment and admits to using nystatin powder to bilateral groins at bedtime and wicking moisture during the day with drawtex. The c also admits to attempts and dietary changes in an effort to reduce caloric intake and achieve weight loss. ==== Objective [name] [name], [name] [name] ([id number]) Constitutional Pulse regular. Respirations normal and unlabored. Afebrile. Vitals Time Taken: 10:50 AM, Height: 72 in, Weight: 230 lbs, BMI: 31.2, Temperature: 98.0 F, Pulse: 69 bpm, Respiratory Rate: 16 bre Blood Pressure: 100/62 mmHg. Eyes Nonicteric. Reactive to light. Ears, Nose, Mouth, and Throat Lips, teeth, and gums WNL.Marland Kitchen Moist mucosa without lesions. Neck supple and nontender. No palpable supraclavicular or cervical adenopathy. Normal sized without goiter. Respiratory WNL. No retractions.. Cardiovascular Pedal Pulses WNL. No clubbing, cyanosis  or edema. Chest Breasts symmetical and no nipple discharge.. Breast tissue WNL, no masses, lumps, or tenderness.. Lymphatic No adneopathy. No adenopathy. No adenopathy. Musculoskeletal Adexa without tenderness or enlargement.. Digits and nails w/o clubbing, cyanosis, infection, petechiae, ischemia, or inflammatory con Psychiatric Judgement and insight Intact.. No evidence of depression, anxiety, or agitation.. General Notes: the right groin has a few superficial ulcerations but the deeper ulcerations closer to his scrotum have completely healed not have any florid fungal infection. Integumentary (Hair, Skin) No suspicious lesions. No crepitus or fluctuance. No peri-wound warmth or erythema. No masses.. Wound #4 status is Open. Original cause of wound was Gradually Appeared. The wound is located on the Right Groin. The wound me 2cm length x 0.2cm width x 0.1cm depth; 0.314cm^2 area and 0.031cm^3 volume. The wound is limited to skin breakdown. There is no or undermining noted. There is a medium amount of serous drainage noted. The wound margin is  distinct with the outline attached to th base. There is large (67-100%) red, pink granulation within the wound bed. There is no necrotic tissue within the wound bed. Periwoun temperature was noted as No Abnormality. Assessment Active Problems ICD-10 S31.501D - Unspecified open wound of unspecified external genital organs, male, subsequent encounter Z87.820 - Personal history of traumatic brain injury [name] [name], [name] [name] ([id number]) Plan Wound Cleansing: Wound #4 Right Groin: Clean wound with Normal Saline. Cleanse wound with mild soap and water May Shower, gently pat wound dry prior to applying new dressing. Anesthetic: Wound #4 Right Groin: Topical Lidocaine 4% cream applied to wound bed prior to debridement Skin Barriers/Peri-Wound Care: Wound #4 Right Groin: Antifungal powder-Nystatin Secondary Dressing: Wound #4 Right Groin: Drawtex Dressing Change Frequency: Wound #4 Right Groin: Change dressing every day. Follow-up Appointments: Wound #4 Right Groin: Other: - 3 weeks Off-Loading: Wound #4 Right Groin: Turn and reposition every 2 hours Additional Orders / Instructions: Wound #4 Right Groin: Increase protein intake. I have recommended we continue with Nystatin powder and drawtex to be change as frequently as need be. We discussed offloading, adequate vitamins including vitamin A, vitamin C and zinc have been discussed with them. He will be back for review soon. Electronic Signature(s) Signed: 09/27/2016 2:35:21 PM By: Elliot Gurney RN, BSN, Kim RN, BSN Signed: 09/27/2016 3:39:05 PM By: Evlyn Kanner MD, FACS Previous Signature: 09/13/2016 3:56:11 PM Version By: Evlyn Kanner MD, FACS Previous Signature: 09/12/2016 4:09:25 PM Version By: Evlyn Kanner MD, FACS Previous Signature: 09/12/2016 4:09:16 PM Version By: Evlyn Kanner MD, FACS Previous Signature: 09/12/2016 11:14:13 AM Version By: Evlyn Kanner MD, FACS Entered By: Elliot Gurney RN, BSN, Kim on 09/27/2016 14:24:08 [name] [name]  ([id number]) -------------------------------------------------------------------------------- SuperBill Details Patient Name: [name] [name] Date of Service: 09/12/2016 Medical Record Number: [id number] Patient Account Number: [id number] Date of Birth/Sex: [birth year] (30 y.o. Male) Treating RN: Phillis Haggis Primary Care Provider: Bethann Punches Other Clinician: Referring Provider: Bethann Punches Treating Provider/Extender: Evlyn Kanner Service Line: Outpatient Weeks in Treatment: 5 Diagnosis Coding ICD-10 Codes Code Description S31.501D Unspecified open wound of unspecified external genital organs, male, subsequent encounter Z87.820 Personal history of traumatic brain injury Facility Procedures CPT4 Code: [id number] Description: [phone] - WOUND CARE VISIT-LEV 3 EST PT Modifier: Q: Physician Procedures CPT4 Code: [id number] Description: 99213 - WC PHYS LEVEL 3 - EST PT ICD-10 Description Diagnosis S31.501D Unspecified open wound of unspecified external genital organs, male, subsequent e Z87.820 Personal history of traumatic brain injury Modifier: Acupuncturist) Signed: 09/13/2016 3:53:40 PM By: Evlyn Kanner MD, FACS Signed: 09/13/2016 4:03:50 PM By: Alejandro Mulling  Previous Signature: 09/12/2016 11:34:16 AM Version By: Evlyn Kanner MD, FACS Entered By: Alejandro Mulling on 09/12/2016 16:29:09

## 2016-09-14 NOTE — Progress Notes (Signed)
ORA, MCNATT ([id number]) Visit Report for 09/12/2016 Arrival Information Details Patient Name: [name] [name], [name] [name] Date of Service: 09/12/2016 11:00 AM Medical Record Number: [id number] Patient Account Number: [id number] Date of Birth/Sex: 01-Jan-1986 (30 y.o. Male) Treating RN: Ahmed Prima Primary Care [name] [name]: Emily Filbert Other Clinician: Referring Debrah Granderson: Emily Filbert Treating Jewelene Mairena/Extender: Frann Rider in Treatment: 5 Visit Information History Since Last Visit All ordered tests and consults were No Patient Arrived: Wheel completed: Chair Added or deleted any medications: No Arrival Time: 10:47 Any new allergies or adverse No Accompanied By: caregiver reactions: Transfer Assistance: Civil Service fast streamer Had a fall or experienced change in No Patient Identification Verified: Yes activities of daily living that may Secondary Verification Process Yes affect Completed: risk of falls: Patient Requires Transmission-Based No Signs or symptoms of abuse/neglect No Precautions: since last visito Patient Has Alerts: No Hospitalized since last visit: No Pain Present Now: Unable to Respond Electronic Signature(s) Signed: 09/13/2016 4:03:50 PM By: Alric Quan Entered By: Alric Quan on 09/12/2016 10:50:40 [name] [name] ([id number]) -------------------------------------------------------------------------------- Clinic Level of Care Assessment Details Patient Name: [name] [name] Date of Service: 09/12/2016 11:00 AM Medical Record Number: [id number] Patient Account Number: [id number] Date of Birth/Sex: [birth year] (30 y.o. Male) Treating RN: Ahmed Prima Primary Care Caylan Schifano: Emily Filbert Other Clinician: Referring Dewey Viens: Emily Filbert Treating Darold Miley/Extender: Frann Rider in Treatment: 5 Clinic Level of Care Assessment Items TOOL 4 Quantity Score X - Use when only an EandM is performed on FOLLOW-UP visit 1 0 ASSESSMENTS - Nursing Assessment /  Reassessment X - Reassessment of Co-morbidities (includes updates in patient status) 1 10 X - Reassessment of Adherence to Treatment Plan 1 5 ASSESSMENTS - Wound and Skin Assessment / Reassessment X - Simple Wound Assessment / Reassessment - one wound 1 5 '[]'$  - Complex Wound Assessment / Reassessment - multiple wounds 0 '[]'$  - Dermatologic / Skin Assessment (not related to wound area) 0 ASSESSMENTS - Focused Assessment '[]'$  - Circumferential Edema Measurements - multi extremities 0 '[]'$  - Nutritional Assessment / Counseling / Intervention 0 '[]'$  - Lower Extremity Assessment (monofilament, tuning fork, pulses) 0 '[]'$  - Peripheral Arterial Disease Assessment (using hand held doppler) 0 ASSESSMENTS - Ostomy and/or Continence Assessment and Care '[]'$  - Incontinence Assessment and Management 0 '[]'$  - Ostomy Care Assessment and Management (repouching, etc.) 0 PROCESS - Coordination of Care X - Simple Patient / Family Education for ongoing care 1 15 '[]'$  - Complex (extensive) Patient / Family Education for ongoing care 0 '[]'$  - Staff obtains Programmer, systems, Records, Test Results / Process Orders 0 '[]'$  - Staff telephones HHA, Nursing Homes / Clarify orders / etc 0 '[]'$  - Routine Transfer to another Facility (non-emergent condition) 0 Moch, Yvonne ([id number]) '[]'$  - Routine Hospital Admission (non-emergent condition) 0 '[]'$  - New Admissions / Biomedical engineer / Ordering NPWT, Apligraf, etc. 0 '[]'$  - Emergency Hospital Admission (emergent condition) 0 X - Simple Discharge Coordination 1 10 '[]'$  - Complex (extensive) Discharge Coordination 0 PROCESS - Special Needs '[]'$  - Pediatric / Minor Patient Management 0 '[]'$  - Isolation Patient Management 0 '[]'$  - Hearing / Language / Visual special needs 0 '[]'$  - Assessment of Community assistance (transportation, D/C planning, etc.) 0 '[]'$  - Additional assistance / Altered mentation 0 '[]'$  - Support Surface(s) Assessment (bed, cushion, seat, etc.) 0 INTERVENTIONS - Wound Cleansing /  Measurement '[]'$  - Simple Wound Cleansing - one wound 0 X - Complex Wound Cleansing - multiple wounds 1 5 '[]'$  - Wound Imaging (photographs - any number of wounds) 0 X - Wound  Tracing (instead of photographs) 1 5 '[]'$  - Simple Wound Measurement - one wound 0 X - Complex Wound Measurement - multiple wounds 1 5 INTERVENTIONS - Wound Dressings X - Small Wound Dressing one or multiple wounds 1 10 '[]'$  - Medium Wound Dressing one or multiple wounds 0 '[]'$  - Large Wound Dressing one or multiple wounds 0 '[]'$  - Application of Medications - topical 0 '[]'$  - Application of Medications - injection 0 INTERVENTIONS - Miscellaneous '[]'$  - External ear exam 0 Colee, Travor ([id number]) '[]'$  - Specimen Collection (cultures, biopsies, blood, body fluids, etc.) 0 '[]'$  - Specimen(s) / Culture(s) sent or taken to Lab for analysis 0 X - Patient Transfer (multiple staff / Harrel Lemon Lift / Similar devices) 1 10 '[]'$  - Simple Staple / Suture removal (25 or less) 0 '[]'$  - Complex Staple / Suture removal (26 or more) 0 '[]'$  - Hypo / Hyperglycemic Management (close monitor of Blood Glucose) 0 '[]'$  - Ankle / Brachial Index (ABI) - do not check if billed separately 0 X - Vital Signs 1 5 Has the patient been seen at the hospital within the last three years: Yes Total Score: 85 Level Of Care: New/Established - Level 3 Electronic Signature(s) Signed: 09/13/2016 4:03:50 PM By: Alric Quan Entered By: Alric Quan on 09/12/2016 16:29:01 [name] [name] ([id number]) -------------------------------------------------------------------------------- Encounter Discharge Information Details Patient Name: [name] [name] Date of Service: 09/12/2016 11:00 AM Medical Record Number: [id number] Patient Account Number: [id number] Date of Birth/Sex: 1986/03/10 (30 y.o. Male) Treating RN: Ahmed Prima Primary Care Adaya Garmany: Emily Filbert Other Clinician: Referring Anab Vivar: Emily Filbert Treating Anniah Glick/Extender: Frann Rider in Treatment:  5 Encounter Discharge Information Items Discharge Pain Level: 0 Discharge Condition: Stable Ambulatory Status: Wheelchair Discharge Destination: Home Transportation: Private Auto Accompanied By: caregiver Schedule Follow-up Appointment: Yes Medication Reconciliation completed and provided to Patient/Care No Sim Choquette: Provided on Clinical Summary of Care: 09/12/2016 Form Type Recipient Paper Patient ZI Electronic Signature(s) Signed: 09/13/2016 4:03:50 PM By: Alric Quan Previous Signature: 09/12/2016 11:31:54 AM Version By: Ruthine Dose Entered By: Alric Quan on 09/12/2016 11:32:15 [name] [name] ([id number]) -------------------------------------------------------------------------------- Lower Extremity Assessment Details Patient Name: [name] [name] Date of Service: 09/12/2016 11:00 AM Medical Record Number: [id number] Patient Account Number: [id number] Date of Birth/Sex: 09-May-1986 (30 y.o. Male) Treating RN: Ahmed Prima Primary Care Nitara Szczerba: Emily Filbert Other Clinician: Referring Morton Simson: Emily Filbert Treating Sunni Richardson/Extender: Frann Rider in Treatment: 5 Electronic Signature(s) Signed: 09/13/2016 4:03:50 PM By: Alric Quan Entered By: Alric Quan on 09/12/2016 11:03:15 [name] [name] ([id number]) -------------------------------------------------------------------------------- Multi Wound Chart Details Patient Name: [name] [name] Date of Service: 09/12/2016 11:00 AM Medical Record Number: [id number] Patient Account Number: [id number] Date of Birth/Sex: Aug 13, 1985 (30 y.o. Male) Treating RN: Ahmed Prima Primary Care Emani Taussig: Emily Filbert Other Clinician: Referring Adonnis Salceda: Emily Filbert Treating Draxton Luu/Extender: Frann Rider in Treatment: 5 Vital Signs Height(in): 72 Pulse(bpm): 69 Weight(lbs): 230 Blood Pressure 100/62 (mmHg): Body Mass Index(BMI): 31 Temperature(F): 98.0 Respiratory Rate 16 (breaths/min): Photos:  [4:No Photos] [N/A:N/A] Wound Location: [4:Right Groin] [N/A:N/A] Wounding Event: [4:Gradually Appeared] [N/A:N/A] Primary Etiology: [4:Atypical] [N/A:N/A] Comorbid History: [4:Anemia, Hepatitis C, History of pressure wounds, Paraplegia, Seizure Disorder] [N/A:N/A] Date Acquired: [4:07/22/2016] [N/A:N/A] Weeks of Treatment: [4:0] [N/A:N/A] Wound Status: [4:Open] [N/A:N/A] Measurements L x W x D 2x0.2x0.1 [N/A:N/A] (cm) Area (cm) : [4:0.314] [N/A:N/A] Volume (cm) : [4:0.031] [N/A:N/A] Classification: [4:Partial Thickness] [N/A:N/A] Exudate Amount: [4:Medium] [N/A:N/A] Exudate Type: [4:Serous] [N/A:N/A] Exudate Color: [4:amber] [N/A:N/A] Wound Margin: [4:Distinct, outline attached] [N/A:N/A] Granulation Amount: [4:Large (67-100%)] [N/A:N/A] Granulation Quality: [4:Red, Pink] [N/A:N/A] Necrotic Amount: [  4:None Present (0%)] [N/A:N/A] Exposed Structures: [4:Fascia: No Fat Layer (Subcutaneous Tissue) Exposed: No Tendon: No Muscle: No Joint: No Bone: No] [N/A:N/A] Limited to Skin Breakdown Epithelialization: None N/A N/A Periwound Skin Texture: No Abnormalities Noted N/A N/A Periwound Skin No Abnormalities Noted N/A N/A Moisture: Periwound Skin Color: No Abnormalities Noted N/A N/A Temperature: No Abnormality N/A N/A Tenderness on No N/A N/A Palpation: Wound Preparation: Ulcer Cleansing: N/A N/A Rinsed/Irrigated with Saline Topical Anesthetic Applied: None Treatment Notes Electronic Signature(s) Signed: 09/12/2016 11:12:10 AM By: Christin Fudge MD, FACS Entered By: Christin Fudge on 09/12/2016 11:12:10 [name] [name] ([id number]) -------------------------------------------------------------------------------- Multi-Disciplinary Care Plan Details Patient Name: [name] [name] Date of Service: 09/12/2016 11:00 AM Medical Record Number: [id number] Patient Account Number: [id number] Date of Birth/Sex: 17-Sep-1985 (30 y.o. Male) Treating RN: Ahmed Prima Primary Care Zuly Belkin:  Emily Filbert Other Clinician: Referring Isamar Nazir: Emily Filbert Treating Anahi Belmar/Extender: Frann Rider in Treatment: 5 Active Inactive ` Abuse / Safety / Falls / Self Care Management Nursing Diagnoses: Potential for falls Goals: Patient will remain injury free Date Initiated: 08/05/2016 Target Resolution Date: 10/26/2016 Goal Status: Active Interventions: Assess fall risk on admission and as needed Notes: ` Orientation to the Wound Care Program Nursing Diagnoses: Knowledge deficit related to the wound healing center program Goals: Patient/caregiver will verbalize understanding of the [facility] Program Date Initiated: 08/05/2016 Target Resolution Date: 10/26/2016 Goal Status: Active Interventions: Provide education on orientation to the wound center Notes: ` Pain, Acute or Chronic Nursing Diagnoses: Pain, acute or chronic: actual or potential Potential alteration in comfort, pain Eno, Alroy Dust ([id number]) Goals: Patient will verbalize adequate pain control and receive pain control interventions during procedures as needed Date Initiated: 08/05/2016 Target Resolution Date: 10/26/2016 Goal Status: Active Patient/caregiver will verbalize adequate pain control between visits Date Initiated: 08/05/2016 Target Resolution Date: 10/26/2016 Goal Status: Active Patient/caregiver will verbalize comfort level met Date Initiated: 08/05/2016 Target Resolution Date: 10/26/2016 Goal Status: Active Interventions: Assess comfort goal upon admission Notes: ` Wound/Skin Impairment Nursing Diagnoses: Impaired tissue integrity Knowledge deficit related to ulceration/compromised skin integrity Goals: Ulcer/skin breakdown will have a volume reduction of 30% by week 4 Date Initiated: 08/05/2016 Target Resolution Date: 10/26/2016 Goal Status: Active Ulcer/skin breakdown will have a volume reduction of 50% by week 8 Date Initiated: 08/05/2016 Target Resolution Date:  10/26/2016 Goal Status: Active Ulcer/skin breakdown will have a volume reduction of 80% by week 12 Date Initiated: 08/05/2016 Target Resolution Date: 10/26/2016 Goal Status: Active Interventions: Assess patient/caregiver ability to obtain necessary supplies Assess patient/caregiver ability to perform ulcer/skin care regimen upon admission and as needed Notes: Electronic Signature(s) Signed: 09/13/2016 4:03:50 PM By: Alric Quan Entered By: Alric Quan on 09/12/2016 11:03:20 [name] [name] ([id number]) [name] [name] ([id number]) -------------------------------------------------------------------------------- Pain Assessment Details Patient Name: [name] [name] Date of Service: 09/12/2016 11:00 AM Medical Record Number: [id number] Patient Account Number: [id number] Date of Birth/Sex: Nov 17, 1985 (30 y.o. Male) Treating RN: Ahmed Prima Primary Care Ciarrah Rae: Emily Filbert Other Clinician: Referring Yulia Ulrich: Emily Filbert Treating Nicolet Griffy/Extender: Frann Rider in Treatment: 5 Active Problems Location of Pain Severity and Description of Pain Patient Has Paino Patient Unable to Respond Site Locations Pain Management and Medication Current Pain Management: Electronic Signature(s) Signed: 09/13/2016 4:03:50 PM By: Alric Quan Entered By: Alric Quan on 09/12/2016 10:50:46 [name] [name] ([id number]) -------------------------------------------------------------------------------- Patient/Caregiver Education Details Patient Name: [name] [name] Date of Service: 09/12/2016 11:00 AM Medical Record Number: [id number] Patient Account Number: [id number] Date of Birth/Gender: [birth year] (30 y.o. Male) Treating RN: Ahmed Prima Primary Care Physician: Emily Filbert Other  Clinician: Referring Physician: Emily Filbert Treating Physician/Extender: Frann Rider in Treatment: 5 Education Assessment Education Provided To: Patient Education Topics Provided Wound/Skin  Impairment: Handouts: Other: change dressings as directed Methods: Demonstration, Explain/Verbal Responses: State content correctly Electronic Signature(s) Signed: 09/13/2016 4:03:50 PM By: Alric Quan Entered By: Alric Quan on 09/12/2016 11:32:29 [name] [name] ([id number]) -------------------------------------------------------------------------------- Wound Assessment Details Patient Name: [name] [name] Date of Service: 09/12/2016 11:00 AM Medical Record Number: [id number] Patient Account Number: [id number] Date of Birth/Sex: 18-Dec-1985 (30 y.o. Male) Treating RN: Ahmed Prima Primary Care Zyionna Pesce: Emily Filbert Other Clinician: Referring Jaima Janney: Emily Filbert Treating Brieana Shimmin/Extender: Frann Rider in Treatment: 5 Wound Status Wound Number: 4 Primary Atypical Etiology: Wound Location: Right Groin Wound Open Wounding Event: Gradually Appeared Status: Date Acquired: 07/22/2016 Comorbid Anemia, Hepatitis C, History of Weeks Of Treatment: 0 History: pressure wounds, Paraplegia, Seizure Clustered Wound: No Disorder Photos Photo Uploaded By: Alric Quan on 09/12/2016 16:12:53 Wound Measurements Length: (cm) 2 Width: (cm) 0.2 Depth: (cm) 0.1 Area: (cm) 0.314 Volume: (cm) 0.031 % Reduction in Area: % Reduction in Volume: Epithelialization: None Tunneling: No Undermining: No Wound Description Classification: Partial Thickness Foul Odor Aft Wound Margin: Distinct, outline attached Slough/Fibrin Exudate Amount: Medium Exudate Type: Serous Exudate Color: amber er Cleansing: No o No Wound Bed Granulation Amount: Large (67-100%) Exposed Structure Granulation Quality: Red, Pink Fascia Exposed: No Necrotic Amount: None Present (0%) Fat Layer (Subcutaneous Tissue) Exposed: No Tendon Exposed: No Carnegie, [place] ([id number]) Muscle Exposed: No Joint Exposed: No Bone Exposed: No Limited to Skin Breakdown Periwound Skin Texture Texture  Color No Abnormalities Noted: No No Abnormalities Noted: No Moisture Temperature / Pain No Abnormalities Noted: No Temperature: No Abnormality Wound Preparation Ulcer Cleansing: Rinsed/Irrigated with Saline Topical Anesthetic Applied: None Treatment Notes Wound #4 (Right Groin) 1. Cleansed with: Clean wound with Normal Saline 3. Peri-wound Care: Antifungal powder Notes drawtex Electronic Signature(s) Signed: 09/13/2016 4:03:50 PM By: Alric Quan Entered By: Alric Quan on 09/12/2016 11:02:25 [name] [name] ([id number]) -------------------------------------------------------------------------------- [place] Details Patient Name: [name] [name] Date of Service: 09/12/2016 11:00 AM Medical Record Number: [id number] Patient Account Number: [id number] Date of Birth/Sex: [birth year] (30 y.o. Male) Treating RN: Ahmed Prima Primary Care Gavino Fouch: Emily Filbert Other Clinician: Referring Cardarius Senat: Emily Filbert Treating Hildreth Robart/Extender: Frann Rider in Treatment: 5 Vital Signs Time Taken: 10:50 Temperature (F): 98.0 Height (in): 72 Pulse (bpm): 69 Weight (lbs): 230 Respiratory Rate (breaths/min): 16 Body Mass Index (BMI): 31.2 Blood Pressure (mmHg): 100/62 Reference Range: 80 - 120 mg / dl Electronic Signature(s) Signed: 09/13/2016 4:03:50 PM By: Alric Quan Entered By: Alric Quan on 09/12/2016 10:51:06

## 2016-09-24 ENCOUNTER — Encounter: Payer: Self-pay | Admitting: Internal Medicine

## 2016-09-24 DIAGNOSIS — G4731 Primary central sleep apnea: Secondary | ICD-10-CM

## 2016-09-24 DIAGNOSIS — G4733 Obstructive sleep apnea (adult) (pediatric): Secondary | ICD-10-CM

## 2016-09-27 ENCOUNTER — Telehealth: Payer: Self-pay | Admitting: Pulmonary Disease

## 2016-09-27 DIAGNOSIS — J189 Pneumonia, unspecified organism: Secondary | ICD-10-CM

## 2016-09-27 NOTE — Telephone Encounter (Signed)
Pt caretaker, Leeroy BockChelsea, is calling , states pt needs an appt. States pt has been diagnosed with the flu last Saturday. States pt O2 today was 88. States she did get it to 90 after albuterol treatment and percussion vest. Pt mother just wants to keep him out of the hospital. Please call mom

## 2016-09-27 NOTE — Telephone Encounter (Signed)
Mom states pt had flu and was given Tamiflu and states he went to PCP and they stated his sats are dropping into the 80'S she states she did give neb treatment and he is being treated with Levaquin. Gave mom appt for Monday at 1:45pm. Informed if pt's sats dropped over the weekend to take him to ER. Mom verbalized understanding. Nothing further needed.   Per DS, pt needs CXR prior to appt. Will order. Mom informed CXR needed. Nothing further needed.

## 2016-09-30 ENCOUNTER — Encounter: Payer: Self-pay | Admitting: Pulmonary Disease

## 2016-09-30 ENCOUNTER — Ambulatory Visit (INDEPENDENT_AMBULATORY_CARE_PROVIDER_SITE_OTHER): Payer: BLUE CROSS/BLUE SHIELD | Admitting: Internal Medicine

## 2016-09-30 ENCOUNTER — Ambulatory Visit
Admission: RE | Admit: 2016-09-30 | Discharge: 2016-09-30 | Disposition: A | Discharge status: Home / Self Care | Payer: BLUE CROSS/BLUE SHIELD | Source: Ambulatory Visit | Attending: Pulmonary Disease | Admitting: Pulmonary Disease

## 2016-09-30 ENCOUNTER — Telehealth: Payer: Self-pay | Admitting: Pulmonary Disease

## 2016-09-30 ENCOUNTER — Encounter: Payer: Self-pay | Admitting: Internal Medicine

## 2016-09-30 VITALS — BP 114/66 | HR 70

## 2016-09-30 DIAGNOSIS — J189 Pneumonia, unspecified organism: Secondary | ICD-10-CM | POA: Diagnosis present

## 2016-09-30 DIAGNOSIS — R5081 Fever presenting with conditions classified elsewhere: Secondary | ICD-10-CM

## 2016-09-30 MED ORDER — PREDNISONE 20 MG PO TABS: 20.0000 mg | ORAL_TABLET | Freq: Every day | ORAL | 1 refills | Status: DC

## 2016-09-30 MED ORDER — AMOXICILLIN-POT CLAVULANATE 875-125 MG PO TABS: 1.0000 | ORAL_TABLET | Freq: Two times a day (BID) | ORAL | 0 refills | Status: AC

## 2016-09-30 NOTE — Telephone Encounter (Signed)
Pt mother calling asking if we can send an order for a potable oxygen tank Also states patient had an over night study, she never got those results  Please advise

## 2016-09-30 NOTE — Patient Instructions (Signed)
Start augmentin 875 mg twice daily by mouth Prednisone 20 mg daily for 7 days Check UA with Culture
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
## 2016-10-01 ENCOUNTER — Other Ambulatory Visit
Admission: RE | Admit: 2016-10-01 | Discharge: 2016-10-01 | Disposition: A | Discharge status: Home / Self Care | Payer: BLUE CROSS/BLUE SHIELD | Source: Ambulatory Visit | Attending: Internal Medicine | Admitting: Internal Medicine

## 2016-10-01 DIAGNOSIS — R5081 Fever presenting with conditions classified elsewhere: Secondary | ICD-10-CM | POA: Diagnosis present

## 2016-10-01 LAB — URINALYSIS, COMPLETE (UACMP) WITH MICROSCOPIC
Bilirubin Urine: NEGATIVE
GLUCOSE, UA: NEGATIVE mg/dL
HGB URINE DIPSTICK: NEGATIVE
KETONES UR: NEGATIVE mg/dL
Leukocytes, UA: NEGATIVE
Nitrite: NEGATIVE
PROTEIN: NEGATIVE mg/dL
RBC / HPF: NONE SEEN RBC/hpf (ref 0–5)
Specific Gravity, Urine: 1.004 — ABNORMAL LOW (ref 1.005–1.030)
pH: 7 (ref 5.0–8.0)

## 2016-10-01 NOTE — Telephone Encounter (Signed)
ONO is in your folder. Please advise. I don't believe we can order portable O2 for the pt since we are unable to ambulate him.

## 2016-10-02 LAB — URINE CULTURE: CULTURE: NO GROWTH

## 2016-10-03 ENCOUNTER — Encounter: Payer: BLUE CROSS/BLUE SHIELD | Attending: Surgery | Admitting: Surgery

## 2016-10-03 DIAGNOSIS — D649 Anemia, unspecified: Secondary | ICD-10-CM | POA: Insufficient documentation

## 2016-10-03 DIAGNOSIS — Z79899 Other long term (current) drug therapy: Secondary | ICD-10-CM | POA: Diagnosis not present

## 2016-10-03 DIAGNOSIS — Z86711 Personal history of pulmonary embolism: Secondary | ICD-10-CM | POA: Diagnosis not present

## 2016-10-03 DIAGNOSIS — Z87820 Personal history of traumatic brain injury: Secondary | ICD-10-CM | POA: Insufficient documentation

## 2016-10-03 DIAGNOSIS — Z87891 Personal history of nicotine dependence: Secondary | ICD-10-CM | POA: Insufficient documentation

## 2016-10-03 DIAGNOSIS — R32 Unspecified urinary incontinence: Secondary | ICD-10-CM | POA: Diagnosis not present

## 2016-10-03 DIAGNOSIS — X58XXXD Exposure to other specified factors, subsequent encounter: Secondary | ICD-10-CM | POA: Insufficient documentation

## 2016-10-03 DIAGNOSIS — G8220 Paraplegia, unspecified: Secondary | ICD-10-CM | POA: Insufficient documentation

## 2016-10-03 DIAGNOSIS — B1920 Unspecified viral hepatitis C without hepatic coma: Secondary | ICD-10-CM | POA: Insufficient documentation

## 2016-10-03 DIAGNOSIS — G40909 Epilepsy, unspecified, not intractable, without status epilepticus: Secondary | ICD-10-CM | POA: Diagnosis not present

## 2016-10-03 DIAGNOSIS — Z7901 Long term (current) use of anticoagulants: Secondary | ICD-10-CM | POA: Diagnosis not present

## 2016-10-03 DIAGNOSIS — S31501D Unspecified open wound of unspecified external genital organs, male, subsequent encounter: Secondary | ICD-10-CM | POA: Insufficient documentation

## 2016-10-04 ENCOUNTER — Telehealth: Payer: Self-pay | Admitting: Pulmonary Disease

## 2016-10-04 NOTE — Telephone Encounter (Signed)
Pt sister calling stating pt is on antibiotics and is being treated for Pneumonia But patient has been running a fever  Would like some feedback on this Please advise

## 2016-10-04 NOTE — Telephone Encounter (Signed)
His CXR from 03/05 was clear. It does not seem like this is from his lungs. We can get another CXR if she thinks that it is (i.e. If he is having respiratory or chest symptoms). Otherwise, would refer her back to his primary MD Bethann Punches(Mark Miller).   Theodoro Gristave

## 2016-10-04 NOTE — Telephone Encounter (Signed)
Mom informed and states they will contact PCP. Nothing further needed.

## 2016-10-04 NOTE — Progress Notes (Signed)
[name], [name] ([id number]) Visit Report for 10/03/2016 Arrival Information Details Patient Name: [name] [name], [name] [name] Date of Service: 10/03/2016 11:00 AM Medical Record Number: [id number] Patient Account Number: [id number] Date of Birth/Sex: [birth year] (31 y.o. Male) Treating RN: Ahmed Prima Primary Care Atlee Kluth: Emily Filbert Other Clinician: Referring Jarquis Walker: Emily Filbert Treating Gorgeous Newlun/Extender: Frann Rider in Treatment: 8 Visit Information History Since Last Visit All ordered tests and consults were No Patient Arrived: Wheel completed: Chair Added or deleted any medications: No Arrival Time: 11:15 Any new allergies or adverse No Accompanied By: caregiver reactions: Transfer Assistance: [employer] fast streamer Had a fall or experienced change in No Patient Identification Verified: Yes activities of daily living that may Secondary Verification Process Yes affect Completed: risk of falls: Patient Requires Transmission-Based No Signs or symptoms of abuse/neglect No Precautions: since last visito Patient Has Alerts: No Hospitalized since last visit: No Has Dressing in Place as Yes Prescribed: Pain Present Now: Unable to Respond Electronic Signature(s) Signed: 10/03/2016 4:26:43 PM By: Alric Quan Entered By: Alric Quan on 10/03/2016 11:15:37 [name] [name] ([id number]) -------------------------------------------------------------------------------- Clinic Level of Care Assessment Details Patient Name: [name] [name] Date of Service: 10/03/2016 11:00 AM Medical Record Number: [id number] Patient Account Number: [id number] Date of Birth/Sex: 1986/01/27 (31 y.o. Male) Treating RN: Ahmed Prima Primary Care Duwayne Matters: Emily Filbert Other Clinician: Referring Suriyah Vergara: Emily Filbert Treating Rosie Torrez/Extender: Frann Rider in Treatment: 8 Clinic Level of Care Assessment Items TOOL 4 Quantity Score X - Use when only an EandM is performed on FOLLOW-UP visit 1  0 ASSESSMENTS - Nursing Assessment / Reassessment X - Reassessment of Co-morbidities (includes updates in patient status) 1 10 X - Reassessment of Adherence to Treatment Plan 1 5 ASSESSMENTS - Wound and Skin Assessment / Reassessment X - Simple Wound Assessment / Reassessment - one wound 1 5 '[]'$  - Complex Wound Assessment / Reassessment - multiple wounds 0 '[]'$  - Dermatologic / Skin Assessment (not related to wound area) 0 ASSESSMENTS - Focused Assessment '[]'$  - Circumferential Edema Measurements - multi extremities 0 '[]'$  - Nutritional Assessment / Counseling / Intervention 0 '[]'$  - Lower Extremity Assessment (monofilament, tuning fork, pulses) 0 '[]'$  - Peripheral Arterial Disease Assessment (using hand held doppler) 0 ASSESSMENTS - Ostomy and/or Continence Assessment and Care '[]'$  - Incontinence Assessment and Management 0 '[]'$  - Ostomy Care Assessment and Management (repouching, etc.) 0 PROCESS - Coordination of Care X - Simple Patient / Family Education for ongoing care 1 15 '[]'$  - Complex (extensive) Patient / Family Education for ongoing care 0 '[]'$  - Staff obtains Programmer, systems, Records, Test Results / Process Orders 0 '[]'$  - Staff telephones HHA, Nursing Homes / Clarify orders / etc 0 '[]'$  - Routine Transfer to another Facility (non-emergent condition) 0 Truman, Alton ([id number]) '[]'$  - Routine Hospital Admission (non-emergent condition) 0 '[]'$  - New Admissions / Biomedical engineer / Ordering NPWT, Apligraf, etc. 0 '[]'$  - Emergency Hospital Admission (emergent condition) 0 X - Simple Discharge Coordination 1 10 '[]'$  - Complex (extensive) Discharge Coordination 0 PROCESS - Special Needs '[]'$  - Pediatric / Minor Patient Management 0 '[]'$  - Isolation Patient Management 0 '[]'$  - Hearing / Language / Visual special needs 0 '[]'$  - Assessment of Community assistance (transportation, D/C planning, etc.) 0 '[]'$  - Additional assistance / Altered mentation 0 '[]'$  - Support Surface(s) Assessment (bed, cushion, seat, etc.)  0 INTERVENTIONS - Wound Cleansing / Measurement X - Simple Wound Cleansing - one wound 1 5 '[]'$  - Complex Wound Cleansing - multiple wounds 0 X - Wound Imaging (photographs - any  number of wounds) 1 5 '[]'$  - Wound Tracing (instead of photographs) 0 X - Simple Wound Measurement - one wound 1 5 '[]'$  - Complex Wound Measurement - multiple wounds 0 INTERVENTIONS - Wound Dressings X - Small Wound Dressing one or multiple wounds 1 10 '[]'$  - Medium Wound Dressing one or multiple wounds 0 '[]'$  - Large Wound Dressing one or multiple wounds 0 X - Application of Medications - topical 1 5 '[]'$  - Application of Medications - injection 0 INTERVENTIONS - Miscellaneous '[]'$  - External ear exam 0 Robeck, Nylan ([id number]) '[]'$  - Specimen Collection (cultures, biopsies, blood, body fluids, etc.) 0 '[]'$  - Specimen(s) / Culture(s) sent or taken to Lab for analysis 0 X - Patient Transfer (multiple staff / Harrel Lemon Lift / Similar devices) 1 10 '[]'$  - Simple Staple / Suture removal (25 or less) 0 '[]'$  - Complex Staple / Suture removal (26 or more) 0 '[]'$  - Hypo / Hyperglycemic Management (close monitor of Blood Glucose) 0 '[]'$  - Ankle / Brachial Index (ABI) - do not check if billed separately 0 X - Vital Signs 1 5 Has the patient been seen at the hospital within the last three years: Yes Total Score: 90 Level Of Care: New/Established - Level 3 Electronic Signature(s) Signed: 10/03/2016 4:26:43 PM By: Alric Quan Entered By: Alric Quan on 10/03/2016 13:13:35 [name] [name] ([id number]) -------------------------------------------------------------------------------- Encounter Discharge Information Details Patient Name: [name] [name] Date of Service: 10/03/2016 11:00 AM Medical Record Number: [id number] Patient Account Number: [id number] Date of Birth/Sex: March 01, 1986 (31 y.o. Male) Treating RN: Ahmed Prima Primary Care Misbah Hornaday: Emily Filbert Other Clinician: Referring Waymond Meador: Emily Filbert Treating Auburn Hester/Extender:  Frann Rider in Treatment: 8 Encounter Discharge Information Items Discharge Pain Level: 0 Discharge Condition: Stable Ambulatory Status: Wheelchair Discharge Destination: Home Transportation: Private Auto Accompanied By: caregiver Schedule Follow-up Appointment: Yes Medication Reconciliation completed No and provided to Patient/Care Zakaiya Lares: Provided on Clinical Summary of Care: 10/03/2016 Form Type Recipient Paper Patient ZI Electronic Signature(s) Signed: 10/03/2016 11:56:33 AM By: Ruthine Dose Entered By: Ruthine Dose on 10/03/2016 11:56:33 [name] [name] ([id number]) -------------------------------------------------------------------------------- Lower Extremity Assessment Details Patient Name: [name] [name] Date of Service: 10/03/2016 11:00 AM Medical Record Number: [id number] Patient Account Number: [id number] Date of Birth/Sex: 1985/11/04 (30 y.o. Male) Treating RN: Ahmed Prima Primary Care Kamron Portee: Emily Filbert Other Clinician: Referring Lovella Hardie: Emily Filbert Treating Divante Kotch/Extender: Frann Rider in Treatment: 8 Electronic Signature(s) Signed: 10/03/2016 4:26:43 PM By: Alric Quan Entered By: Alric Quan on 10/03/2016 11:32:12 [name] [name] ([id number]) -------------------------------------------------------------------------------- Multi Wound Chart Details Patient Name: [name] [name] Date of Service: 10/03/2016 11:00 AM Medical Record Number: [id number] Patient Account Number: [id number] Date of Birth/Sex: [birth year] (30 y.o. Male) Treating RN: Ahmed Prima Primary Care Kamiya Acord: Emily Filbert Other Clinician: Referring Cole Klugh: Emily Filbert Treating Robbert Langlinais/Extender: Frann Rider in Treatment: 8 Vital Signs Height(in): 72 Pulse(bpm): 60 Weight(lbs): 230 Blood Pressure 104/75 (mmHg): Body Mass Index(BMI): 31 Temperature(F): 97.8 Respiratory Rate 16 (breaths/min): Photos: [4:No Photos] [N/A:N/A] Wound Location:  [4:Right Groin] [N/A:N/A] Wounding Event: [4:Gradually Appeared] [N/A:N/A] Primary Etiology: [4:Atypical] [N/A:N/A] Comorbid History: [4:Anemia, Hepatitis C, History of pressure wounds, Paraplegia, Seizure Disorder] [N/A:N/A] Date Acquired: [4:07/22/2016] [N/A:N/A] Weeks of Treatment: [4:3] [N/A:N/A] Wound Status: [4:Open] [N/A:N/A] Measurements L x W x D 1.2x0.4x0.1 [N/A:N/A] (cm) Area (cm) : [4:0.377] [N/A:N/A] Volume (cm) : [4:0.038] [N/A:N/A] % Reduction in Area: [4:-20.10%] [N/A:N/A] % Reduction in Volume: -22.60% [N/A:N/A] Classification: [4:Partial Thickness] [N/A:N/A] Exudate Amount: [4:Medium] [N/A:N/A] Exudate Type: [4:Serous] [N/A:N/A] Exudate Color: [4:amber] [N/A:N/A] Wound Margin: [4:Distinct, outline attached] [N/A:N/A] Granulation  Amount: [4:Large (67-100%)] [N/A:N/A] Granulation Quality: [4:Red, Pink] [N/A:N/A] Necrotic Amount: [4:None Present (0%)] [N/A:N/A] Exposed Structures: [4:Fascia: No Fat Layer (Subcutaneous Tissue) Exposed: No Tendon: No Muscle: No] [N/A:N/A] Joint: No Bone: No Limited to Skin Breakdown Epithelialization: None N/A N/A Periwound Skin Texture: No Abnormalities Noted N/A N/A Periwound Skin No Abnormalities Noted N/A N/A Moisture: Periwound Skin Color: No Abnormalities Noted N/A N/A Temperature: No Abnormality N/A N/A Tenderness on No N/A N/A Palpation: Wound Preparation: Ulcer Cleansing: N/A N/A Rinsed/Irrigated with Saline Topical Anesthetic Applied: None Treatment Notes Wound #4 (Right Groin) 1. Cleansed with: Clean wound with Normal Saline 2. Anesthetic Topical Lidocaine 4% cream to wound bed prior to debridement 3. Peri-wound Care: Antifungal powder Notes drawtex Electronic Signature(s) Signed: 10/03/2016 11:52:49 AM By: Evlyn Kanner MD, FACS Entered By: Evlyn Kanner on 10/03/2016 11:52:49 [name] ([id number]) -------------------------------------------------------------------------------- Multi-Disciplinary  Care Plan Details Patient Name: [name] Date of Service: 10/03/2016 11:00 AM Medical Record Number: [id number] Patient Account Number: [id number] Date of Birth/Sex: [birth year] (30 y.o. Male) Treating RN: Phillis Haggis Primary Care Shameika Speelman: Bethann Punches Other Clinician: Referring Deziyah Arvin: Bethann Punches Treating Briceida Rasberry/Extender: Rudene Re in Treatment: 8 Active Inactive ` Abuse / Safety / Falls / Self Care Management Nursing Diagnoses: Potential for falls Goals: Patient will remain injury free Date Initiated: 08/05/2016 Target Resolution Date: 10/26/2016 Goal Status: Active Interventions: Assess fall risk on admission and as needed Notes: ` Orientation to the Wound Care Program Nursing Diagnoses: Knowledge deficit related to the wound healing center program Goals: Patient/caregiver will verbalize understanding of the Wound Healing Center Program Date Initiated: 08/05/2016 Target Resolution Date: 10/26/2016 Goal Status: Active Interventions: Provide education on orientation to the wound center Notes: ` Pain, Acute or Chronic Nursing Diagnoses: Pain, acute or chronic: actual or potential Potential alteration in comfort, pain Raker, Earna Coder ([id number]) Goals: Patient will verbalize adequate pain control and receive pain control interventions during procedures as needed Date Initiated: 08/05/2016 Target Resolution Date: 10/26/2016 Goal Status: Active Patient/caregiver will verbalize adequate pain control between visits Date Initiated: 08/05/2016 Target Resolution Date: 10/26/2016 Goal Status: Active Patient/caregiver will verbalize comfort level met Date Initiated: 08/05/2016 Target Resolution Date: 10/26/2016 Goal Status: Active Interventions: Assess comfort goal upon admission Notes: ` Wound/Skin Impairment Nursing Diagnoses: Impaired tissue integrity Knowledge deficit related to ulceration/compromised skin integrity Goals: Ulcer/skin breakdown will have a  volume reduction of 30% by week 4 Date Initiated: 08/05/2016 Target Resolution Date: 10/26/2016 Goal Status: Active Ulcer/skin breakdown will have a volume reduction of 50% by week 8 Date Initiated: 08/05/2016 Target Resolution Date: 10/26/2016 Goal Status: Active Ulcer/skin breakdown will have a volume reduction of 80% by week 12 Date Initiated: 08/05/2016 Target Resolution Date: 10/26/2016 Goal Status: Active Interventions: Assess patient/caregiver ability to obtain necessary supplies Assess patient/caregiver ability to perform ulcer/skin care regimen upon admission and as needed Notes: Electronic Signature(s) Signed: 10/03/2016 4:26:43 PM By: Alejandro Mulling Entered By: Alejandro Mulling on 10/03/2016 11:32:17 [name] ([id number]) [name] ([id number]) -------------------------------------------------------------------------------- Pain Assessment Details Patient Name: [name] Date of Service: 10/03/2016 11:00 AM Medical Record Number: [id number] Patient Account Number: [id number] Date of Birth/Sex: May 26, 1986 (30 y.o. Male) Treating RN: Phillis Haggis Primary Care Eura Mccauslin: Bethann Punches Other Clinician: Referring Abbigale Mcelhaney: Bethann Punches Treating Lannette Avellino/Extender: Rudene Re in Treatment: 8 Active Problems Location of Pain Severity and Description of Pain Patient Has Paino Patient Unable to Respond Site Locations Pain Management and Medication Current Pain Management: Electronic Signature(s) Signed: 10/03/2016 4:26:43 PM By: Alejandro Mulling Entered By: Alejandro Mulling on 10/03/2016 11:15:44  KIN, GALBRAITH ([id number]) -------------------------------------------------------------------------------- Patient/Caregiver Education Details Patient Name: NATION, CRADLE Date of Service: 10/03/2016 11:00 AM Medical Record Number: [id number] Patient Account Number: [id number] Date of Birth/Gender: 03-Oct-1985 (30 y.o. Male) Treating RN: Ahmed Prima Primary Care  Physician: Emily Filbert Other Clinician: Referring Physician: Emily Filbert Treating Physician/Extender: Frann Rider in Treatment: 8 Education Assessment Education Provided To: Patient and Caregiver Education Topics Provided Wound/Skin Impairment: Handouts: Other: change dressing as ordered Electronic Signature(s) Signed: 10/03/2016 4:26:43 PM By: Alric Quan Entered By: Alric Quan on 10/03/2016 11:37:43 [name] [name] ([id number]) -------------------------------------------------------------------------------- Wound Assessment Details Patient Name: [name] [name] Date of Service: 10/03/2016 11:00 AM Medical Record Number: [id number] Patient Account Number: [id number] Date of Birth/Sex: Apr 22, 1986 (30 y.o. Male) Treating RN: Ahmed Prima Primary Care Kenzy Campoverde: Emily Filbert Other Clinician: Referring Landa Mullinax: Emily Filbert Treating Anis Degidio/Extender: Frann Rider in Treatment: 8 Wound Status Wound Number: 4 Primary Atypical Etiology: Wound Location: Right Groin Wound Open Wounding Event: Gradually Appeared Status: Date Acquired: 07/22/2016 Comorbid Anemia, Hepatitis C, History of Weeks Of Treatment: 3 History: pressure wounds, Paraplegia, Seizure Clustered Wound: No Disorder Photos Photo Uploaded By: Alric Quan on 10/03/2016 14:49:49 Wound Measurements Length: (cm) 1.2 Width: (cm) 0.4 Depth: (cm) 0.1 Area: (cm) 0.377 Volume: (cm) 0.038 % Reduction in Area: -20.1% % Reduction in Volume: -22.6% Epithelialization: None Tunneling: No Undermining: No Wound Description Classification: Partial Thickness Foul Odor Aft Wound Margin: Distinct, outline attached Slough/Fibrin Exudate Amount: Medium Exudate Type: Serous Exudate Color: amber er Cleansing: No o No Wound Bed Granulation Amount: Large (67-100%) Exposed Structure Granulation Quality: Red, Pink Fascia Exposed: No Necrotic Amount: None Present (0%) Fat Layer (Subcutaneous  Tissue) Exposed: No Tendon Exposed: No Gotts, Wyndham ([id number]) Muscle Exposed: No Joint Exposed: No Bone Exposed: No Limited to Skin Breakdown Periwound Skin Texture Texture Color No Abnormalities Noted: No No Abnormalities Noted: No Moisture Temperature / Pain No Abnormalities Noted: No Temperature: No Abnormality Wound Preparation Ulcer Cleansing: Rinsed/Irrigated with Saline Topical Anesthetic Applied: None Treatment Notes Wound #4 (Right Groin) 1. Cleansed with: Clean wound with Normal Saline 2. Anesthetic Topical Lidocaine 4% cream to wound bed prior to debridement 3. Peri-wound Care: Antifungal powder Notes drawtex Electronic Signature(s) Signed: 10/03/2016 4:26:43 PM By: Alric Quan Entered By: Alric Quan on 10/03/2016 11:32:03 [name] [name] ([id number]) -------------------------------------------------------------------------------- Vitals Details Patient Name: [name] [name] Date of Service: 10/03/2016 11:00 AM Medical Record Number: [id number] Patient Account Number: [id number] Date of Birth/Sex: 1986/04/05 (30 y.o. Male) Treating RN: Ahmed Prima Primary Care Zaydee Aina: Emily Filbert Other Clinician: Referring Lulie Hurd: Emily Filbert Treating Nekoda Chock/Extender: Frann Rider in Treatment: 8 Vital Signs Time Taken: 11:15 Temperature (F): 97.8 Height (in): 72 Pulse (bpm): 60 Weight (lbs): 230 Respiratory Rate (breaths/min): 16 Body Mass Index (BMI): 31.2 Blood Pressure (mmHg): 104/75 Reference Range: 80 - 120 mg / dl Electronic Signature(s) Signed: 10/03/2016 4:26:43 PM By: Alric Quan Entered By: Alric Quan on 10/03/2016 11:16:50

## 2016-10-04 NOTE — Progress Notes (Signed)
[name] [name], [name] [name] ([id number]) Visit Report for 10/03/2016 Chief Complaint Document Details Patient Name: [name] [name], [name] [name] Date of Service: 10/03/2016 11:00 AM Medical Record Number: [id number] Patient Account Number: [id number] Date of Birth/Sex: 12/24/1985 (30 y.o. Male) Treating RN: [name] [name], [name] [name] Primary Care Provider: Bethann [name], [name] [name] Other Clinician: Referring Provider: Bethann [name], [name] [name] Treating Provider/Extender: [name] [name], [name] [name] in Treatment: 8 Information Obtained from: Caregiver Chief Complaint he returns with an ulceration to his right groin which has been fairly superficial for about couple of [name]. Electronic Signature(s) Signed: 10/03/2016 11:52:55 AM By: [name] [name], [name] Cavanah MD, [name] [name] Entered By: [name] [name], [name] [name] on 10/03/2016 11:52:55 [name] [name], [name] [name] ([id number]) -------------------------------------------------------------------------------- HPI Details Patient Name: [name] [name], [name] [name] Date of Service: 10/03/2016 11:00 AM Medical Record Number: [id number] Patient Account Number: [id number] Date of Birth/Sex: 03/03/1986 (30 y.o. Male) Treating RN: [name] [name], [name] [name] Primary Care Provider: Bethann [name], [name] [name] Other Clinician: Referring Provider: Bethann [name], [name] [name] Treating Provider/Extender: [name] [name], [name] [name] [name] in Treatment: 8 History of Present Illness Location: right groin Quality: Patient reports No Pain. Severity: Patient states wound are getting worse. Duration: Patient has had the wound for > 1 months prior to seeking treatment at the wound center Context: The wound appeared gradually over time HPI Description: 31 year old patient who has traumatic brain injury was recently healed at the end of November 2017 for chronic right and left groin wounds. He continue conservative therapy but recently they noticed that deep down in his right groin and perineum he has a slight open superficial ulceration. Nothing else has changed in his health except that he has got a new wheelchair. 08/22/2016 -- I saw him last he had an  aspiration pneumonia and was admitted to the hospital and also had developed some cellulitis on his flank. He has recovered from this. 09/12/2016 -- since I saw him last it is been 3 [name] but he has recovered from his pneumonia and his caregiver says he has put on quite a bit of weight due to improper diet. 10/03/2016 -- he was down with the flu and then had pneumonia and had to be treated with antibiotics by his PCP and the pulmonary physicians == Old notes: 31 year old male who has a history of traumatic brain injury and also has a ulcer on his left heel which was noted by the family about 4-5 months ago. The TBI was in January 2013 and prior to this visit the patient has had hyperbaric oxygen therapy approximately 2 years after his TBI. He is had several gastrostomy tube placed by the gastroenterologist but then finally had a surgical procedure to bring the gastric mucosa to the skin surface. This was done approximately 9 months ago. Past medical history of hepatitis C, traumatic brain injury, pulmonary emboli, ileostomy, tracheostomy, PEG tube placement. Was a former smoker and quit in January 2013 He known to see [name] [name] for various issues with wounds and this time around for about a month he has open areas near his groin possibly due to the contracture. No history of fungal infection of the groin. 04/04/2016 -- his caregiver says there is a lot of drainage and she has to often change it more than once a day because of the amount of drainage in this area. May 16 2016 -- the caregiver says that is continuous drainage which increases the redness and then he has multiple small linear superficial ulcerations. 06-27-16 Mr. [name] Eavesrby presents today accompanied by his caregiver for follow-up. The caregiver denies any issues or changes since the last appointment and admits to using nystatin powder to bilateral groins at bedtime and  wicking moisture during the day with drawtex. The caregiver also admits  to attempts and [name] [name], [name] [name] ([id number]) dietary changes in an effort to reduce caloric intake and achieve weight loss. === Electronic Signature(s) Signed: 10/03/2016 11:53:42 AM By: [name] Kanner MD, [name] [name] Entered By: [name] Kanner on 10/03/2016 11:53:42 [name] [name] ([id number]) -------------------------------------------------------------------------------- Physical Exam Details Patient Name: [name] [name] Date of Service: 10/03/2016 11:00 AM Medical Record Number: [id number] Patient Account Number: [id number] Date of Birth/Sex: 10/16/1985 (30 y.o. Male) Treating RN: [name] Haggis Primary Care Provider: Bethann Punches Other Clinician: Referring Provider: Bethann Punches Treating Provider/Extender: [name] Re in Treatment: 8 Constitutional . Pulse regular. Respirations normal and unlabored. Afebrile. . Eyes Nonicteric. Reactive to light. Ears, Nose, Mouth, and Throat Lips, teeth, and gums WNL.Marland Kitchen Moist mucosa without lesions. Neck supple and nontender. No palpable supraclavicular or cervical adenopathy. Normal sized without goiter. Respiratory WNL. No retractions.. Breath sounds WNL, No rubs, rales, rhonchi, or wheeze.. Cardiovascular Heart rhythm and rate regular, no murmur or gallop.. Pedal Pulses WNL. No clubbing, cyanosis or edema. Chest Breasts symmetical and no nipple discharge.. Breast tissue WNL, no masses, lumps, or tenderness.. Lymphatic No adneopathy. No adenopathy. No adenopathy. Musculoskeletal Adexa without tenderness or enlargement.. Digits and nails w/o clubbing, cyanosis, infection, petechiae, ischemia, or inflammatory conditions.. Integumentary (Hair, Skin) No suspicious lesions. No crepitus or fluctuance. No peri-wound warmth or erythema. No masses.Marland Kitchen Psychiatric Judgement and insight Intact.. No evidence of depression, anxiety, or agitation.. Notes the area on the right groin has a few superficial linear ulcerations and other than that there is no  evidence of cellulitis Electronic Signature(s) Signed: 10/03/2016 11:54:04 AM By: [name] Kanner MD, [name] [name] Entered By: [name] Kanner on 10/03/2016 11:54:03 [name] [name] ([id number]) -------------------------------------------------------------------------------- Physician Orders Details Patient Name: [name] [name] Date of Service: 10/03/2016 11:00 AM Medical Record Number: [id number] Patient Account Number: [id number] Date of Birth/Sex: 1985/08/25 (30 y.o. Male) Treating RN: [name] Haggis Primary Care Provider: Bethann Punches Other Clinician: Referring Provider: Bethann Punches Treating Provider/Extender: [name] Re in Treatment: 8 Verbal / Phone Orders: Yes ClinicianAshok Cordia, [name] [name] Read Back and Verified: Yes Diagnosis Coding Wound Cleansing Wound #4 Right Groin o Clean wound with Normal Saline. o Cleanse wound with mild soap and water o May Shower, gently pat wound dry prior to applying new dressing. Anesthetic Wound #4 Right Groin o Topical Lidocaine 4% cream applied to wound bed prior to debridement Skin Barriers/Peri-Wound Care Wound #4 Right Groin o Antifungal powder-Nystatin Secondary Dressing Wound #4 Right Groin o Drawtex - in office o Other - interdry Dressing Change Frequency Wound #4 Right Groin o Change dressing every day. Follow-up Appointments Wound #4 Right Groin o Other: - 3 [name] Off-Loading Wound #4 Right Groin o Turn and reposition every 2 hours Additional Orders / Instructions Wound #4 Right Groin o Increase protein intake. [name], [name] ([id number]) Electronic Signature(s) Signed: 10/03/2016 4:19:30 PM By: [name] Kanner MD, [name] [name] Signed: 10/03/2016 4:26:43 PM By: Alejandro Mulling Entered By: Alejandro Mulling on 10/03/2016 11:41:17 [name] [name] ([id number]) -------------------------------------------------------------------------------- Problem List Details Patient Name: [name] [name] Date of Service: 10/03/2016 11:00  AM Medical Record Number: [id number] Patient Account Number: [id number] Date of Birth/Sex: 10-31-1985 (30 y.o. Male) Treating RN: [name] Haggis Primary Care Provider: Bethann Punches Other Clinician: Referring Provider: Bethann Punches Treating Provider/Extender: [name] Re in Treatment: 8 Active Problems ICD-10 Encounter Code Description Active Date Diagnosis S31.501D Unspecified open wound of unspecified external genital 08/05/2016 Yes organs, male, subsequent encounter Z87.820 Personal history of traumatic brain injury 08/05/2016 Yes Inactive Problems Resolved  Problems Electronic Signature(s) Signed: 10/03/2016 11:52:44 AM By: [name] Kanner MD, [name] [name] Entered By: [name] Kanner on 10/03/2016 11:52:44 [name] [name] ([id number]) -------------------------------------------------------------------------------- Progress Note Details Patient Name: [name] [name] Date of Service: 10/03/2016 11:00 AM Medical Record Number: [id number] Patient Account Number: [id number] Date of Birth/Sex: 1986-02-20 (30 y.o. Male) Treating RN: [name] Haggis Primary Care Provider: Bethann Punches Other Clinician: Referring Provider: Bethann Punches Treating Provider/Extender: [name] Re in Treatment: 8 Subjective Chief Complaint Information obtained from Caregiver he returns with an ulceration to his right groin which has been fairly superficial for about couple of [name]. History of Present Illness (HPI) The following HPI elements were documented for the patient's wound: Location: right groin Quality: Patient reports No Pain. Severity: Patient states wound are getting worse. Duration: Patient has had the wound for > 1 months prior to seeking treatment at the wound center Context: The wound appeared gradually over time 30 year old patient who has traumatic brain injury was recently healed at the end of November 2017 for chronic right and left groin wounds. He continue conservative therapy but recently  they noticed that deep down in his right groin and perineum he has a slight open superficial ulceration. Nothing else has changed in his health except that he has got a new wheelchair. 08/22/2016 -- I saw him last he had an aspiration pneumonia and was admitted to the hospital and also had developed some cellulitis on his flank. He has recovered from this. 09/12/2016 -- since I saw him last it is been 3 [name] but he has recovered from his pneumonia and his caregiver says he has put on quite a bit of weight due to improper diet. 10/03/2016 -- he was down with the flu and then had pneumonia and had to be treated with antibiotics by his PCP and the pulmonary physicians == Old notes: 31 year old male who has a history of traumatic brain injury and also has a ulcer on his left heel which was noted by the family about 4-5 months ago. The TBI was in January 2013 and prior to this visit the patient has had hyperbaric oxygen therapy approximately 2 years after his TBI. He is had several gastrostomy tube placed by the gastroenterologist but then finally had a surgical procedure to bring the gastric mucosa to the skin surface. This was done approximately 9 months ago. Past medical history of hepatitis C, traumatic brain injury, pulmonary emboli, ileostomy, tracheostomy, PEG tube placement. Was a former smoker and quit in January 2013 He known to see [place] for various issues with wounds and this time around for about a month he has open areas near his groin possibly due to the contracture. No history of fungal infection of the groin. TERRACE, FONTANILLA ([id number]) 04/04/2016 -- his caregiver says there is a lot of drainage and she has to often change it more than once a day because of the amount of drainage in this area. May 16 2016 -- the caregiver says that is continuous drainage which increases the redness and then he has multiple small linear superficial ulcerations. 06-27-16 Mr. Higinbotham presents today  accompanied by his caregiver for follow-up. The caregiver denies any issues or changes since the last appointment and admits to using nystatin powder to bilateral groins at bedtime and wicking moisture during the day with drawtex. The caregiver also admits to attempts and dietary changes in an effort to reduce caloric intake and achieve weight loss. === Objective Constitutional Pulse regular. Respirations normal and unlabored. Afebrile. Vitals Time Taken: 11:15 AM, Height:  72 in, Weight: 230 lbs, BMI: 31.2, Temperature: 97.8 F, Pulse: 60 bpm, Respiratory Rate: 16 breaths/min, Blood Pressure: 104/75 mmHg. Eyes Nonicteric. Reactive to light. Ears, Nose, Mouth, and Throat Lips, teeth, and gums WNL.Marland Kitchen Moist mucosa without lesions. Neck supple and nontender. No palpable supraclavicular or cervical adenopathy. Normal sized without goiter. Respiratory WNL. No retractions.. Breath sounds WNL, No rubs, rales, rhonchi, or wheeze.. Cardiovascular Heart rhythm and rate regular, no murmur or gallop.. Pedal Pulses WNL. No clubbing, cyanosis or edema. Chest Breasts symmetical and no nipple discharge.. Breast tissue WNL, no masses, lumps, or tenderness.. Lymphatic No adneopathy. No adenopathy. No adenopathy. Musculoskeletal Adexa without tenderness or enlargement.. Digits and nails w/o clubbing, cyanosis, infection, petechiae, ischemia, or inflammatory conditions.[name] [name] ([id number]) Psychiatric Judgement and insight Intact.. No evidence of depression, anxiety, or agitation.. General Notes: the area on the right groin has a few superficial linear ulcerations and other than that there is no evidence of cellulitis Integumentary (Hair, Skin) No suspicious lesions. No crepitus or fluctuance. No peri-wound warmth or erythema. No masses.. Wound #4 status is Open. Original cause of wound was Gradually Appeared. The wound is located on the Right Groin. The wound measures 1.2cm length x 0.4cm width  x 0.1cm depth; 0.377cm^2 area and 0.038cm^3 volume. The wound is limited to skin breakdown. There is no tunneling or undermining noted. There is a medium amount of serous drainage noted. The wound margin is distinct with the outline attached to the wound base. There is large (67-100%) red, pink granulation within the wound bed. There is no necrotic tissue within the wound bed. Periwound temperature was noted as No Abnormality. Assessment Active Problems ICD-10 S31.501D - Unspecified open wound of unspecified external genital organs, male, subsequent encounter Z87.820 - Personal history of traumatic brain injury Plan Wound Cleansing: Wound #4 Right Groin: Clean wound with Normal Saline. Cleanse wound with mild soap and water May Shower, gently pat wound dry prior to applying new dressing. Anesthetic: Wound #4 Right Groin: Topical Lidocaine 4% cream applied to wound bed prior to debridement Skin Barriers/Peri-Wound Care: Wound #4 Right Groin: Antifungal powder-Nystatin Secondary Dressing: Wound #4 Right Groin: Drawtex - in office Other [name] [name], [name] [name] ([id number]) Dressing Change Frequency: Wound #4 Right Groin: Change dressing every day. Follow-up Appointments: Wound #4 Right Groin: Other: - 3 [name] Off-Loading: Wound #4 Right Groin: Turn and reposition every 2 hours Additional Orders / Instructions: Wound #4 Right Groin: Increase protein intake. I have recommended we continue with Nystatin powder and drawtex to be change as frequently as need be. If they would like to use inter-dry fabric for the groin area I have no objection. We discussed offloading, adequate vitamins including vitamin A, vitamin C and zinc have been discussed with them. He will be back for review soon. Electronic Signature(s) Signed: 10/03/2016 11:56:18 AM By: [name] Kanner MD, [name] [name] Entered By: [name] Kanner on 10/03/2016 11:56:18 [name] [name]  ([id number]) -------------------------------------------------------------------------------- SuperBill Details Patient Name: [name] [name] Date of Service: 10/03/2016 Medical Record Number: [id number] Patient Account Number: [id number] Date of Birth/Sex: September 12, 1985 (30 y.o. Male) Treating RN: [name] Haggis Primary Care Provider: Bethann Punches Other Clinician: Referring Provider: Bethann Punches Treating Provider/Extender: [name] Kanner Service Line: Outpatient [name] in Treatment: 8 Diagnosis Coding ICD-10 Codes Code Description Unspecified open wound of unspecified external genital organs, male, subsequent S31.501D encounter Z87.820 Personal history of traumatic brain injury Facility Procedures CPT4 Code: [id number] Description: 99213 - WOUND CARE VISIT-LEV 3 EST PT Modifier: Quantity: 1 Physician Procedures CPT4: Description Modifier Quantity  Code [id number] 99213 - WC PHYS LEVEL 3 - EST PT 1 ICD-10 Description Diagnosis S31.501D Unspecified open wound of unspecified external genital organs, male, subsequent encounter Z87.820 Personal history of traumatic  brain injury Electronic Signature(s) Signed: 10/03/2016 4:19:30 PM By: [name] Kanner MD, [name] [name] Signed: 10/03/2016 4:26:43 PM By: Alejandro Mulling Previous Signature: 10/03/2016 11:56:32 AM Version By: [name] Kanner MD, [name] [name] Entered By: Alejandro Mulling on 10/03/2016 13:13:42

## 2016-10-04 NOTE — Telephone Encounter (Signed)
Please advise. Pt seen by [facility] 09/30/16 was given augmentin and Prednisone. Please advise on next step.

## 2016-10-22 ENCOUNTER — Telehealth: Payer: Self-pay | Admitting: Pulmonary Disease

## 2016-10-22 NOTE — Telephone Encounter (Signed)
Pt mother calling stating they never got the results on the oxygen test they did at home  She is also asking about trying to get an protable oxygen tank  When they were here last they also asked for a referral to Neuro-Pulmonologist They have not heard back on either of those. Please advise

## 2016-10-22 NOTE — Telephone Encounter (Signed)
Spoke with [facility] and Verlon AuLeslie is faxing over a form for you to sign for them to test pt for portable O2. Please advise on the referral per mom below about the neuro-pulmonologist. Thanks

## 2016-10-31 ENCOUNTER — Encounter: Payer: BLUE CROSS/BLUE SHIELD | Attending: Surgery | Admitting: Surgery

## 2016-10-31 DIAGNOSIS — Z86711 Personal history of pulmonary embolism: Secondary | ICD-10-CM | POA: Diagnosis not present

## 2016-10-31 DIAGNOSIS — G40909 Epilepsy, unspecified, not intractable, without status epilepticus: Secondary | ICD-10-CM | POA: Diagnosis not present

## 2016-10-31 DIAGNOSIS — Z7901 Long term (current) use of anticoagulants: Secondary | ICD-10-CM | POA: Insufficient documentation

## 2016-10-31 DIAGNOSIS — Z79899 Other long term (current) drug therapy: Secondary | ICD-10-CM | POA: Diagnosis not present

## 2016-10-31 DIAGNOSIS — X58XXXD Exposure to other specified factors, subsequent encounter: Secondary | ICD-10-CM | POA: Insufficient documentation

## 2016-10-31 DIAGNOSIS — R32 Unspecified urinary incontinence: Secondary | ICD-10-CM | POA: Diagnosis not present

## 2016-10-31 DIAGNOSIS — Z87891 Personal history of nicotine dependence: Secondary | ICD-10-CM | POA: Diagnosis not present

## 2016-10-31 DIAGNOSIS — D649 Anemia, unspecified: Secondary | ICD-10-CM | POA: Insufficient documentation

## 2016-10-31 DIAGNOSIS — Z87820 Personal history of traumatic brain injury: Secondary | ICD-10-CM | POA: Insufficient documentation

## 2016-10-31 DIAGNOSIS — G8220 Paraplegia, unspecified: Secondary | ICD-10-CM | POA: Insufficient documentation

## 2016-10-31 DIAGNOSIS — S31501D Unspecified open wound of unspecified external genital organs, male, subsequent encounter: Secondary | ICD-10-CM | POA: Diagnosis present

## 2016-10-31 DIAGNOSIS — B1920 Unspecified viral hepatitis C without hepatic coma: Secondary | ICD-10-CM | POA: Diagnosis not present

## 2016-11-01 NOTE — Progress Notes (Signed)
[name] [name], [name] [name] ([id number]) Visit Report for 10/31/2016 Chief Complaint Document Details Patient Name: [name] [name], [name] [name] Date of Service: 10/31/2016 11:00 AM Medical Record Number: [id number] Patient Account Number: [id number] Date of Birth/Sex: 05/11/1986 (30 y.o. Male) Treating RN: Phillis Haggis Primary Care Provider: Bethann Punches Other Clinician: Referring Provider: Bethann Punches Treating Provider/Extender: Rudene Re in Treatment: 12 Information Obtained from: Caregiver Chief Complaint he returns with an ulceration to his right groin which has been fairly superficial for about couple of weeks. Electronic Signature(s) Signed: 10/31/2016 11:38:09 AM By: Evlyn Kanner MD, FACS Entered By: Evlyn Kanner on 10/31/2016 11:38:09 [name] [name] ([id number]) -------------------------------------------------------------------------------- HPI Details Patient Name: [name] [name] Date of Service: 10/31/2016 11:00 AM Medical Record Number: [id number] Patient Account Number: [id number] Date of Birth/Sex: 03/20/1986 (30 y.o. Male) Treating RN: Phillis Haggis Primary Care Provider: Bethann Punches Other Clinician: Referring Provider: Bethann Punches Treating Provider/Extender: Rudene Re in Treatment: 12 History of Present Illness Location: right groin Quality: Patient reports No Pain. Severity: Patient states wound are getting worse. Duration: Patient has had the wound for > 1 months prior to seeking treatment at the wound center Context: The wound appeared gradually over time HPI Description: 31 year old patient who has traumatic brain injury was recently healed at the end of November 2017 for chronic right and left groin wounds. He continue conservative therapy but recently they noticed that deep down in his right groin and perineum he has a slight open superficial ulceration. Nothing else has changed in his health except that he has got a new wheelchair. 08/22/2016 -- I saw him last he had  an aspiration pneumonia and was admitted to the hospital and also had developed some cellulitis on his flank. He has recovered from this. 09/12/2016 -- since I saw him last it is been 3 weeks but he has recovered from his pneumonia and his caregiver says he has put on quite a bit of weight due to improper diet. 10/03/2016 -- he was down with the flu and then had pneumonia and had to be treated with antibiotics by his PCP and the pulmonary physicians == Old notes: 31 year old male who has a history of traumatic brain injury and also has a ulcer on his left heel which was noted by the family about 4-5 months ago. The TBI was in January 2013 and prior to this visit the patient has had hyperbaric oxygen therapy approximately 2 years after his TBI. He is had several gastrostomy tube placed by the gastroenterologist but then finally had a surgical procedure to bring the gastric mucosa to the skin surface. This was done approximately 9 months ago. Past medical history of hepatitis C, traumatic brain injury, pulmonary emboli, ileostomy, tracheostomy, PEG tube placement. Was a former smoker and quit in January 2013 He known to see [place] for various issues with wounds and this time around for about a month he has open areas near his groin possibly due to the contracture. No history of fungal infection of the groin. 04/04/2016 -- his caregiver says there is a lot of drainage and she has to often change it more than once a day because of the amount of drainage in this area. May 16 2016 -- the caregiver says that is continuous drainage which increases the redness and then he has multiple small linear superficial ulcerations. 06-27-16 Mr. Heffner presents today accompanied by his caregiver for follow-up. The caregiver denies any issues or changes since the last appointment and admits to using nystatin powder to bilateral groins at bedtime and  wicking moisture during the day with drawtex. The caregiver also  admits to attempts and [name] [name], [name] [name] ([id number]) dietary changes in an effort to reduce caloric intake and achieve weight loss. === Electronic Signature(s) Signed: 10/31/2016 11:38:15 AM By: Evlyn Kanner MD, FACS Entered By: Evlyn Kanner on 10/31/2016 11:38:15 [name] [name] ([id number]) -------------------------------------------------------------------------------- Physical Exam Details Patient Name: [name] [name] Date of Service: 10/31/2016 11:00 AM Medical Record Number: [id number] Patient Account Number: [id number] Date of Birth/Sex: August 07, 1985 (30 y.o. Male) Treating RN: Phillis Haggis Primary Care Provider: Bethann Punches Other Clinician: Referring Provider: Bethann Punches Treating Provider/Extender: Rudene Re in Treatment: 12 Constitutional . Pulse regular. Respirations normal and unlabored. Afebrile. . Eyes Nonicteric. Reactive to light. Ears, Nose, Mouth, and Throat Lips, teeth, and gums WNL.Marland Kitchen Moist mucosa without lesions. Neck supple and nontender. No palpable supraclavicular or cervical adenopathy. Normal sized without goiter. Respiratory WNL. No retractions.. Breath sounds WNL, No rubs, rales, rhonchi, or wheeze.. Cardiovascular Heart rhythm and rate regular, no murmur or gallop.. Pedal Pulses WNL. No clubbing, cyanosis or edema. Chest Breasts symmetical and no nipple discharge.. Breast tissue WNL, no masses, lumps, or tenderness.. Lymphatic No adneopathy. No adenopathy. No adenopathy. Musculoskeletal Adexa without tenderness or enlargement.. Digits and nails w/o clubbing, cyanosis, infection, petechiae, ischemia, or inflammatory conditions.. Integumentary (Hair, Skin) No suspicious lesions. No crepitus or fluctuance. No peri-wound warmth or erythema. No masses.Marland Kitchen Psychiatric Judgement and insight Intact.. No evidence of depression, anxiety, or agitation.. Notes the right groin wound has completely healed and there is no evidence of any fungal  infection Electronic Signature(s) Signed: 10/31/2016 11:38:35 AM By: Evlyn Kanner MD, FACS Entered By: Evlyn Kanner on 10/31/2016 11:38:34 [name] [name] ([id number]) -------------------------------------------------------------------------------- Physician Orders Details Patient Name: [name] [name] Date of Service: 10/31/2016 11:00 AM Medical Record Number: [id number] Patient Account Number: [id number] Date of Birth/Sex: March 26, 1986 (30 y.o. Male) Treating RN: Phillis Haggis Primary Care Provider: Bethann Punches Other Clinician: Referring Provider: Bethann Punches Treating Provider/Extender: Rudene Re in Treatment: 12 Verbal / Phone Orders: Yes Clinician: Ashok Cordia, Debi Read Back and Verified: Yes Diagnosis Coding Discharge From [facility] o Discharge from Wound Care Center - Please call our office if you have any questions or concerns. Electronic Signature(s) Signed: 10/31/2016 4:30:26 PM By: Evlyn Kanner MD, FACS Signed: 10/31/2016 5:04:07 PM By: Alejandro Mulling Entered By: Alejandro Mulling on 10/31/2016 11:31:07 [name] [name] ([id number]) -------------------------------------------------------------------------------- Problem List Details Patient Name: [name] [name] Date of Service: 10/31/2016 11:00 AM Medical Record Number: [id number] Patient Account Number: [id number] Date of Birth/Sex: May 30, 1986 (30 y.o. Male) Treating RN: Phillis Haggis Primary Care Provider: Bethann Punches Other Clinician: Referring Provider: Bethann Punches Treating Provider/Extender: Rudene Re in Treatment: 12 Active Problems ICD-10 Encounter Code Description Active Date Diagnosis S31.501D Unspecified open wound of unspecified external genital 08/05/2016 Yes organs, male, subsequent encounter Z87.820 Personal history of traumatic brain injury 08/05/2016 Yes Inactive Problems Resolved Problems Electronic Signature(s) Signed: 10/31/2016 11:37:58 AM By: Evlyn Kanner MD, FACS Entered By:  Evlyn Kanner on 10/31/2016 11:37:58 [name] [name] ([id number]) -------------------------------------------------------------------------------- Progress Note Details Patient Name: [name] [name] Date of Service: 10/31/2016 11:00 AM Medical Record Number: [id number] Patient Account Number: [id number] Date of Birth/Sex: 1986-02-16 (30 y.o. Male) Treating RN: Phillis Haggis Primary Care Provider: Bethann Punches Other Clinician: Referring Provider: Bethann Punches Treating Provider/Extender: Rudene Re in Treatment: 12 Subjective Chief Complaint Information obtained from Caregiver he returns with an ulceration to his right groin which has been fairly superficial for about couple of weeks. History of Present Illness (HPI) The following HPI  elements were documented for the patient's wound: Location: right groin Quality: Patient reports No Pain. Severity: Patient states wound are getting worse. Duration: Patient has had the wound for > 1 months prior to seeking treatment at the wound center Context: The wound appeared gradually over time 31 year old patient who has traumatic brain injury was recently healed at the end of November 2017 for chronic right and left groin wounds. He continue conservative therapy but recently they noticed that deep down in his right groin and perineum he has a slight open superficial ulceration. Nothing else has changed in his health except that he has got a new wheelchair. 08/22/2016 -- I saw him last he had an aspiration pneumonia and was admitted to the hospital and also had developed some cellulitis on his flank. He has recovered from this. 09/12/2016 -- since I saw him last it is been 3 weeks but he has recovered from his pneumonia and his caregiver says he has put on quite a bit of weight due to improper diet. 10/03/2016 -- he was down with the flu and then had pneumonia and had to be treated with antibiotics by his PCP and the pulmonary physicians == Old  notes: 31 year old male who has a history of traumatic brain injury and also has a ulcer on his left heel which was noted by the family about 4-5 months ago. The TBI was in January 2013 and prior to this visit the patient has had hyperbaric oxygen therapy approximately 2 years after his TBI. He is had several gastrostomy tube placed by the gastroenterologist but then finally had a surgical procedure to bring the gastric mucosa to the skin surface. This was done approximately 9 months ago. Past medical history of hepatitis C, traumatic brain injury, pulmonary emboli, ileostomy, tracheostomy, PEG tube placement. Was a former smoker and quit in January 2013 He known to see [place] for various issues with wounds and this time around for about a month he has open areas near his groin possibly due to the contracture. No history of fungal infection of the groin. [name] [name], [name] [name] ([id number]) 04/04/2016 -- his caregiver says there is a lot of drainage and she has to often change it more than once a day because of the amount of drainage in this area. May 16 2016 -- the caregiver says that is continuous drainage which increases the redness and then he has multiple small linear superficial ulcerations. 06-27-16 Mr. Tooker presents today accompanied by his caregiver for follow-up. The caregiver denies any issues or changes since the last appointment and admits to using nystatin powder to bilateral groins at bedtime and wicking moisture during the day with drawtex. The caregiver also admits to attempts and dietary changes in an effort to reduce caloric intake and achieve weight loss. === Objective Constitutional Pulse regular. Respirations normal and unlabored. Afebrile. Vitals Time Taken: 11:20 AM, Height: 72 in, Weight: 230 lbs, BMI: 31.2, Temperature: 98.2 F, Pulse: 66 bpm, Respiratory Rate: 16 breaths/min, Blood Pressure: 111/67 mmHg. Eyes Nonicteric. Reactive to light. Ears, Nose, Mouth, and Throat Lips,  teeth, and gums WNL.Marland Kitchen Moist mucosa without lesions. Neck supple and nontender. No palpable supraclavicular or cervical adenopathy. Normal sized without goiter. Respiratory WNL. No retractions.. Breath sounds WNL, No rubs, rales, rhonchi, or wheeze.. Cardiovascular Heart rhythm and rate regular, no murmur or gallop.. Pedal Pulses WNL. No clubbing, cyanosis or edema. Chest Breasts symmetical and no nipple discharge.. Breast tissue WNL, no masses, lumps, or tenderness.. Lymphatic No adneopathy. No adenopathy. No adenopathy. Musculoskeletal  Adexa without tenderness or enlargement.. Digits and nails w/o clubbing, cyanosis, infection, petechiae, ischemia, or inflammatory conditions.[name] [name] ([id number]) Psychiatric Judgement and insight Intact.. No evidence of depression, anxiety, or agitation.. General Notes: the right groin wound has completely healed and there is no evidence of any fungal infection Integumentary (Hair, Skin) No suspicious lesions. No crepitus or fluctuance. No peri-wound warmth or erythema. No masses.. Wound #4 status is Healed - Epithelialized. Original cause of wound was Gradually Appeared. The wound is located on the Right Groin. The wound measures 0cm length x 0cm width x 0cm depth; 0cm^2 area and 0cm^3 volume. The wound is limited to skin breakdown. There is no tunneling or undermining noted. There is a none present amount of drainage noted. There is no granulation within the wound bed. There is no necrotic tissue within the wound bed. Assessment Active Problems ICD-10 S31.501D - Unspecified open wound of unspecified external genital organs, male, subsequent encounter Z87.820 - Personal history of traumatic brain injury Plan Discharge From [facility]: Discharge from Wound Care Center - Please call our office if you have any questions or concerns. patient's wounds in the right groin has healed and I have discharged him from the wound care services. I have  recommended we continue with Nystatin powder and drawtex to be change as frequently as need be. If they would like to use inter-dry fabric for the groin area I have no objection. We discussed offloading, adequate vitamins including vitamin A, vitamin C and zinc have been discussed with them. [name] [name], [name] [name] ([id number]) He will be back for review Only if needed. Electronic Signature(s) Signed: 10/31/2016 11:39:28 AM By: Evlyn Kanner MD, FACS Entered By: Evlyn Kanner on 10/31/2016 11:39:27 [name] [name] ([id number]) -------------------------------------------------------------------------------- SuperBill Details Patient Name: [name] [name] Date of Service: 10/31/2016 Medical Record Number: [id number] Patient Account Number: [id number] Date of Birth/Sex: 1985/09/24 (30 y.o. Male) Treating RN: Phillis Haggis Primary Care Provider: Bethann Punches Other Clinician: Referring Provider: Bethann Punches Treating Provider/Extender: Rudene Re in Treatment: 12 Diagnosis Coding ICD-10 Codes Code Description Unspecified open wound of unspecified external genital organs, male, subsequent S31.501D encounter Z87.820 Personal history of traumatic brain injury Facility Procedures CPT4 Code: [id number] Description: [phone] - WOUND CARE VISIT-LEV 2 EST PT Modifier: Quantity: 1 Physician Procedures CPT4: Description Modifier Quantity Code [id number] - WC PHYS LEVEL 2 - EST PT 1 ICD-10 Description Diagnosis S31.501D Unspecified open wound of unspecified external genital organs, male, subsequent encounter Z87.820 Personal history of traumatic  brain injury Electronic Signature(s) Signed: 10/31/2016 4:30:26 PM By: Evlyn Kanner MD, FACS Signed: 10/31/2016 5:04:07 PM By: Alejandro Mulling Previous Signature: 10/31/2016 11:39:49 AM Version By: Evlyn Kanner MD, FACS Entered By: Alejandro Mulling on 10/31/2016 11:55:17

## 2016-11-01 NOTE — Progress Notes (Signed)
CAEL, WORTH ([id number]) Visit Report for 10/31/2016 Arrival Information Details Patient Name: [name] [name], [name] [name] Date of Service: 10/31/2016 11:00 AM Medical Record Number: [id number] Patient Account Number: [id number] Date of Birth/Sex: [birth year] (30 y.o. Male) Treating RN: Phillis Haggis Primary Care Savio Albrecht: Bethann Punches Other Clinician: Referring Tanzie Rothschild: Bethann Punches Treating Karstyn Birkey/Extender: Rudene Re in Treatment: 12 Visit Information History Since Last Visit All ordered tests and consults were completed: No Patient Arrived: Wheel Chair Added or deleted any medications: No Arrival Time: 11:19 Any new allergies or adverse reactions: No Accompanied By: caregiver Had a fall or experienced change in No activities of daily living that may affect Transfer Assistance: Michiel Sites Lift risk of falls: Patient Identification Verified: Yes Signs or symptoms of abuse/neglect since last No Secondary Verification Process Yes visito Completed: Hospitalized since last visit: No Patient Requires Transmission-Based No Has Dressing in Place as Prescribed: Yes Precautions: Pain Present Now: No Patient Has Alerts: No Electronic Signature(s) Signed: 10/31/2016 5:04:07 PM By: Alejandro Mulling Entered By: Alejandro Mulling on 10/31/2016 11:19:51 [name] [name] ([id number]) -------------------------------------------------------------------------------- Clinic Level of Care Assessment Details Patient Name: [name] [name] Date of Service: 10/31/2016 11:00 AM Medical Record Number: [id number] Patient Account Number: [id number] Date of Birth/Sex: 11/26/1985 (30 y.o. Male) Treating RN: Phillis Haggis Primary Care Zamirah Denny: Bethann Punches Other Clinician: Referring Brentyn Seehafer: Bethann Punches Treating Rhys Anchondo/Extender: Rudene Re in Treatment: 12 Clinic Level of Care Assessment Items TOOL 4 Quantity Score X - Use when only an EandM is performed on FOLLOW-UP visit 1 0 ASSESSMENTS - Nursing  Assessment / Reassessment X - Reassessment of Co-morbidities (includes updates in patient status) 1 10 X - Reassessment of Adherence to Treatment Plan 1 5 ASSESSMENTS - Wound and Skin Assessment / Reassessment X - Simple Wound Assessment / Reassessment - one wound 1 5  - Complex Wound Assessment / Reassessment - multiple wounds 0  - Dermatologic / Skin Assessment (not related to wound area) 0 ASSESSMENTS - Focused Assessment  - Circumferential Edema Measurements - multi extremities 0  - Nutritional Assessment / Counseling / Intervention 0  - Lower Extremity Assessment (monofilament, tuning fork, pulses) 0  - Peripheral Arterial Disease Assessment (using hand held doppler) 0 ASSESSMENTS - Ostomy and/or Continence Assessment and Care  - Incontinence Assessment and Management 0  - Ostomy Care Assessment and Management (repouching, etc.) 0 PROCESS - Coordination of Care X - Simple Patient / Family Education for ongoing care 1 15  - Complex (extensive) Patient / Family Education for ongoing care 0  - Staff obtains Chiropractor, Records, Test Results / Process Orders 0  - Staff telephones HHA, Nursing Homes / Clarify orders / etc 0  - Routine Transfer to another Facility (non-emergent condition) 0 [name], [name] ([id number])  - Routine Hospital Admission (non-emergent condition) 0  - New Admissions / Manufacturing engineer / Ordering NPWT, Apligraf, etc. 0  - Emergency Hospital Admission (emergent condition) 0 X - Simple Discharge Coordination 1 10  - Complex (extensive) Discharge Coordination 0 PROCESS - Special Needs  - Pediatric / Minor Patient Management 0  - Isolation Patient Management 0  - Hearing / Language / Visual special needs 0  - Assessment of Community assistance (transportation, D/C planning, etc.) 0  - Additional assistance / Altered mentation 0  - Support Surface(s) Assessment (bed, cushion, seat, etc.) 0 INTERVENTIONS - Wound  Cleansing / Measurement  - Simple Wound Cleansing - one wound 0  - Complex Wound Cleansing - multiple wounds 0 X - Wound Imaging (photographs - any number of wounds)  1 5  - Wound Tracing (instead of photographs) 0  - Simple Wound Measurement - one wound 0  - Complex Wound Measurement - multiple wounds 0 INTERVENTIONS - Wound Dressings  - Small Wound Dressing one or multiple wounds 0  - Medium Wound Dressing one or multiple wounds 0  - Large Wound Dressing one or multiple wounds 0  - Application of Medications - topical 0  - Application of Medications - injection 0 INTERVENTIONS - Miscellaneous  - External ear exam 0 [name], [name] ([id number])  - Specimen Collection (cultures, biopsies, blood, body fluids, etc.) 0  - Specimen(s) / Culture(s) sent or taken to Lab for analysis 0  - Patient Transfer (multiple staff / Michiel Sites Lift / Similar devices) 0  - Simple Staple / Suture removal (25 or less) 0  - Complex Staple / Suture removal (26 or more) 0  - Hypo / Hyperglycemic Management (close monitor of Blood Glucose) 0  - Ankle / Brachial Index (ABI) - do not check if billed separately 0 X - Vital Signs 1 5 Has the patient been seen at the hospital within the last three years: Yes Total Score: 55 Level Of Care: New/Established - Level 2 Electronic Signature(s) Signed: 10/31/2016 5:04:07 PM By: Alejandro Mulling Entered By: Alejandro Mulling on 10/31/2016 11:55:08 [name] [name] ([id number]) -------------------------------------------------------------------------------- Encounter Discharge Information Details Patient Name: [name] [name] Date of Service: 10/31/2016 11:00 AM Medical Record Number: [id number] Patient Account Number: [id number] Date of Birth/Sex: 04/04/1986 (30 y.o. Male) Treating RN: Phillis Haggis Primary Care Raffael Bugarin: Bethann Punches Other Clinician: Referring Akiah Bauch: Bethann Punches Treating Johnnye Sandford/Extender: Rudene Re in Treatment:  12 Encounter Discharge Information Items Discharge Pain Level: 0 Discharge Condition: Stable Ambulatory Status: Wheelchair Discharge Destination: Home Transportation: Private Auto Accompanied By: caregiver Schedule Follow-up Appointment: No Medication Reconciliation completed No and provided to Patient/Care Sean Malinowski: Provided on Clinical Summary of Care: 10/31/2016 Form Type Recipient Paper Patient ZI Electronic Signature(s) Signed: 10/31/2016 11:42:29 AM By: Gwenlyn Perking Entered By: Gwenlyn Perking on 10/31/2016 11:42:29 [name] [name] ([id number]) -------------------------------------------------------------------------------- Lower Extremity Assessment Details Patient Name: [name] [name] Date of Service: 10/31/2016 11:00 AM Medical Record Number: [id number] Patient Account Number: [id number] Date of Birth/Sex: 11-22-1985 (30 y.o. Male) Treating RN: Phillis Haggis Primary Care Sanaa Zilberman: Bethann Punches Other Clinician: Referring Jonovan Boedecker: Bethann Punches Treating Itzell Bendavid/Extender: Rudene Re in Treatment: 12 Electronic Signature(s) Signed: 10/31/2016 5:04:07 PM By: Alejandro Mulling Entered By: Alejandro Mulling on 10/31/2016 11:29:53 [name] [name] ([id number]) -------------------------------------------------------------------------------- Multi Wound Chart Details Patient Name: [name] [name] Date of Service: 10/31/2016 11:00 AM Medical Record Number: [id number] Patient Account Number: [id number] Date of Birth/Sex: Jul 06, 1986 (30 y.o. Male) Treating RN: Phillis Haggis Primary Care Lorice Lafave: Bethann Punches Other Clinician: Referring Yaslene Lindamood: Bethann Punches Treating Paschal Blanton/Extender: Rudene Re in Treatment: 12 Vital Signs Height(in): 72 Pulse(bpm): 66 Weight(lbs): 230 Blood Pressure 111/67 (mmHg): Body Mass Index(BMI): 31 Temperature(F): 98.2 Respiratory Rate 16 (breaths/min): Photos: [4:No Photos] [N/A:N/A] Wound Location: [4:Right Groin] [N/A:N/A] Wounding  Event: [4:Gradually Appeared] [N/A:N/A] Primary Etiology: [4:Atypical] [N/A:N/A] Comorbid History: [4:Anemia, Hepatitis C, History of pressure wounds, Paraplegia, Seizure Disorder] [N/A:N/A] Date Acquired: [4:07/22/2016] [N/A:N/A] Weeks of Treatment: [4:7] [N/A:N/A] Wound Status: [4:Healed - Epithelialized] [N/A:N/A] Measurements L x W x D 0x0x0 [N/A:N/A] (cm) Area (cm) : [4:0] [N/A:N/A] Volume (cm) : [4:0] [N/A:N/A] % Reduction in Area: [4:100.00%] [N/A:N/A] % Reduction in Volume: 100.00% [N/A:N/A] Classification: [4:Partial Thickness] [N/A:N/A] Exudate Amount: [4:None Present] [N/A:N/A] Granulation Amount: [4:None Present (0%)] [N/A:N/A] Necrotic Amount: [4:None Present (0%)] [N/A:N/A] Exposed Structures: [4:Fascia: No Fat Layer (Subcutaneous  Tissue) Exposed: No Tendon: No Muscle: No Joint: No Bone: No Limited to Skin Breakdown] [N/A:N/A] Epithelialization: Large (67-100%) N/A N/A Periwound Skin Texture: No Abnormalities Noted N/A N/A Periwound Skin No Abnormalities Noted N/A N/A Moisture: Periwound Skin Color: No Abnormalities Noted N/A N/A Tenderness on No N/A N/A Palpation: Wound Preparation: Ulcer Cleansing: N/A N/A Rinsed/Irrigated with Saline Topical Anesthetic Applied: None Treatment Notes Electronic Signature(s) Signed: 10/31/2016 11:38:03 AM By: Evlyn Kanner MD, FACS Entered By: Evlyn Kanner on 10/31/2016 11:38:02 [name] [name] ([id number]) -------------------------------------------------------------------------------- Multi-Disciplinary Care Plan Details Patient Name: [name] [name] Date of Service: 10/31/2016 11:00 AM Medical Record Number: [id number] Patient Account Number: [id number] Date of Birth/Sex: 13-Feb-1986 (30 y.o. Male) Treating RN: Phillis Haggis Primary Care Kirstin Kugler: Bethann Punches Other Clinician: Referring Bonham Zingale: Bethann Punches Treating Kalob Bergen/Extender: Rudene Re in Treatment: 12 Active Inactive Electronic Signature(s) Signed:  10/31/2016 5:04:07 PM By: Alejandro Mulling Entered By: Alejandro Mulling on 10/31/2016 11:30:17 [name] [name] ([id number]) -------------------------------------------------------------------------------- Pain Assessment Details Patient Name: [name] [name] Date of Service: 10/31/2016 11:00 AM Medical Record Number: [id number] Patient Account Number: [id number] Date of Birth/Sex: [birth year] (30 y.o. Male) Treating RN: Phillis Haggis Primary Care Joylene Wescott: Bethann Punches Other Clinician: Referring Jerrion Tabbert: Bethann Punches Treating Makailyn Mccormick/Extender: Rudene Re in Treatment: 12 Active Problems Location of Pain Severity and Description of Pain Patient Has Paino No Site Locations With Dressing Change: No Pain Management and Medication Current Pain Management: Electronic Signature(s) Signed: 10/31/2016 5:04:07 PM By: Alejandro Mulling Entered By: Alejandro Mulling on 10/31/2016 11:19:58 [name] [name] ([id number]) -------------------------------------------------------------------------------- Patient/Caregiver Education Details Patient Name: [name] [name] Date of Service: 10/31/2016 11:00 AM Medical Record Number: [id number] Patient Account Number: [id number] Date of Birth/Gender: 19-Oct-1985 (30 y.o. Male) Treating RN: Phillis Haggis Primary Care Physician: Bethann Punches Other Clinician: Referring Physician: Bethann Punches Treating Physician/Extender: Rudene Re in Treatment: 12 Education Assessment Education Provided To: Patient and Caregiver Education Topics Provided Wound/Skin Impairment: Handouts: Other: Please call our office if you have any questions or concerns. Methods: Explain/Verbal Responses: State content correctly Electronic Signature(s) Signed: 10/31/2016 5:04:07 PM By: Alejandro Mulling Entered By: Alejandro Mulling on 10/31/2016 11:31:58 [name] [name] ([id number]) -------------------------------------------------------------------------------- Wound Assessment  Details Patient Name: [name] [name] Date of Service: 10/31/2016 11:00 AM Medical Record Number: [id number] Patient Account Number: [id number] Date of Birth/Sex: 07-Dec-1985 (30 y.o. Male) Treating RN: Phillis Haggis Primary Care Maylynn Orzechowski: Bethann Punches Other Clinician: Referring Graceanne Guin: Bethann Punches Treating Kycen Spalla/Extender: Rudene Re in Treatment: 12 Wound Status Wound Number: 4 Primary Atypical Etiology: Wound Location: Right Groin Wound Healed - Epithelialized Wounding Event: Gradually Appeared Status: Date Acquired: 07/22/2016 Comorbid Anemia, Hepatitis C, History of Weeks Of Treatment: 7 History: pressure wounds, Paraplegia, Seizure Clustered Wound: No Disorder Photos Photo Uploaded By: Alejandro Mulling on 10/31/2016 16:50:07 Wound Measurements Length: (cm) 0 % Reduction in Width: (cm) 0 % Reduction in Depth: (cm) 0 Epithelializati Area: (cm) 0 Tunneling: Volume: (cm) 0 Undermining: Area: 100% Volume: 100% on: Large (67-100%) No No Wound Description Classification: Partial Thickness Exudate Amount: None Present Foul Odor After Cleansing: No Slough/Fibrino No Wound Bed Granulation Amount: None Present (0%) Exposed Structure Necrotic Amount: None Present (0%) Fascia Exposed: No Fat Layer (Subcutaneous Tissue) Exposed: No Tendon Exposed: No Muscle Exposed: No Joint Exposed: No [name], [name] ([id number]) Bone Exposed: No Limited to Skin Breakdown Periwound Skin Texture Texture Color No Abnormalities Noted: No No Abnormalities Noted: No Moisture No Abnormalities Noted: No Wound Preparation Ulcer Cleansing: Rinsed/Irrigated with Saline Topical Anesthetic Applied: None Electronic Signature(s) Signed: 10/31/2016 5:04:07 PM By: Alejandro Mulling Entered  By: Alejandro Mulling on 10/31/2016 11:29:43 [name] [name] ([id number]) -------------------------------------------------------------------------------- Vitals Details Patient Name: [name] [name] Date of Service: 10/31/2016 11:00 AM Medical Record Number: [id number] Patient Account Number: [id number] Date of Birth/Sex: 1986/04/28 (30 y.o. Male) Treating RN: Phillis Haggis Primary Care Shirlean Berman: Bethann Punches Other Clinician: Referring Rana Hochstein: Bethann Punches Treating Shawnae Leiva/Extender: Rudene Re in Treatment: 12 Vital Signs Time Taken: 11:20 Temperature (F): 98.2 Height (in): 72 Pulse (bpm): 66 Weight (lbs): 230 Respiratory Rate (breaths/min): 16 Body Mass Index (BMI): 31.2 Blood Pressure (mmHg): 111/67 Reference Range: 80 - 120 mg / dl Electronic Signature(s) Signed: 10/31/2016 5:04:07 PM By: Alejandro Mulling Entered By: Alejandro Mulling on 10/31/2016 11:20:19

## 2016-11-12 ENCOUNTER — Telehealth: Payer: Self-pay | Admitting: Pulmonary Disease

## 2016-11-12 NOTE — Telephone Encounter (Signed)
Chelse pt care taker calling stating pt is having some congestion  He has had some low O2  Not sure if we can work them in or if patient needs to go Pcp She is asking if we can call her back and let her know

## 2016-11-12 NOTE — Telephone Encounter (Signed)
Spoke with caretaker and informed her to call PCP to see if they can get him in today or tomorrow morning. Informed if not to call me back and we would call [facility] for an appt. Caretaker stated she is waiting on PCP to call back. Caretaker verbalized understanding. nothing further needed at this time.

## 2016-12-06 ENCOUNTER — Other Ambulatory Visit: Payer: Self-pay | Admitting: Pulmonary Disease

## 2016-12-06 MED ORDER — ALBUTEROL SULFATE (2.5 MG/3ML) 0.083% IN NEBU: 3.0000 mL | INHALATION_SOLUTION | Freq: Three times a day (TID) | RESPIRATORY_TRACT | 3 refills | Status: DC

## 2016-12-30 ENCOUNTER — Ambulatory Visit: Payer: BLUE CROSS/BLUE SHIELD | Admitting: Internal Medicine

## 2016-12-31 ENCOUNTER — Telehealth: Payer: Self-pay | Admitting: Pulmonary Disease

## 2016-12-31 MED ORDER — LEVOFLOXACIN 500 MG PO TABS: 500.0000 mg | ORAL_TABLET | Freq: Every day | ORAL | 0 refills | Status: DC

## 2016-12-31 NOTE — Telephone Encounter (Signed)
Per DS, send Levaquin 500 mg daily x 7 days and then if no better needs to be seen by us or PCP.

## 2016-12-31 NOTE — Telephone Encounter (Signed)
Pt mother calling stating pt has a cold and he is getting more congested  She is worried it is getting worst Would like some advise on this  Please call back

## 2016-12-31 NOTE — Telephone Encounter (Signed)
Mom informed and RX sent to [facility]. Nothing further needed.

## 2016-12-31 NOTE — Telephone Encounter (Signed)
Fever of 99-100 last 2 days, not sleeping, doing the vest, Albuterol tx daily. o2 93% today and 96% yesterday. Please advise.

## 2017-01-06 ENCOUNTER — Ambulatory Visit (INDEPENDENT_AMBULATORY_CARE_PROVIDER_SITE_OTHER): Payer: BLUE CROSS/BLUE SHIELD | Admitting: Pulmonary Disease

## 2017-01-06 VITALS — BP 110/78 | HR 73

## 2017-01-06 DIAGNOSIS — R0902 Hypoxemia: Secondary | ICD-10-CM | POA: Diagnosis not present

## 2017-01-06 DIAGNOSIS — J411 Mucopurulent chronic bronchitis: Secondary | ICD-10-CM

## 2017-01-06 DIAGNOSIS — T17500A Unspecified foreign body in bronchus causing asphyxiation, initial encounter: Secondary | ICD-10-CM

## 2017-01-06 DIAGNOSIS — J9809 Other diseases of bronchus, not elsewhere classified: Secondary | ICD-10-CM | POA: Diagnosis not present

## 2017-01-06 DIAGNOSIS — G4734 Idiopathic sleep related nonobstructive alveolar hypoventilation: Secondary | ICD-10-CM | POA: Diagnosis not present

## 2017-01-06 DIAGNOSIS — G4731 Primary central sleep apnea: Secondary | ICD-10-CM

## 2017-01-06 DIAGNOSIS — G8250 Quadriplegia, unspecified: Secondary | ICD-10-CM
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
## 2017-01-06 NOTE — Patient Instructions (Signed)
Increase oxygen flow rate  to3 LPM bleed in into Trilogy machine We will try to get oxygen tank to be used during the day as needed to maintain oxygen saturations greater than 92% Follow up in 4-6 months

## 2017-03-11 ENCOUNTER — Other Ambulatory Visit: Payer: Self-pay | Admitting: Family Medicine

## 2017-03-11 ENCOUNTER — Ambulatory Visit
Admission: RE | Admit: 2017-03-11 | Discharge: 2017-03-11 | Disposition: A | Discharge status: Home / Self Care | Payer: BLUE CROSS/BLUE SHIELD | Source: Ambulatory Visit | Attending: Family Medicine | Admitting: Family Medicine

## 2017-03-11 DIAGNOSIS — K5900 Constipation, unspecified: Secondary | ICD-10-CM

## 2017-07-03 ENCOUNTER — Other Ambulatory Visit: Payer: Self-pay | Admitting: Pulmonary Disease

## 2017-09-24 ENCOUNTER — Ambulatory Visit
Admission: RE | Admit: 2017-09-24 | Discharge: 2017-09-24 | Disposition: A | Discharge status: Home / Self Care | Payer: BLUE CROSS/BLUE SHIELD | Source: Ambulatory Visit | Attending: Pulmonary Disease | Admitting: Pulmonary Disease

## 2017-09-24 ENCOUNTER — Encounter: Payer: Self-pay | Admitting: Pulmonary Disease

## 2017-09-24 ENCOUNTER — Ambulatory Visit (INDEPENDENT_AMBULATORY_CARE_PROVIDER_SITE_OTHER): Payer: BLUE CROSS/BLUE SHIELD | Admitting: Pulmonary Disease

## 2017-09-24 ENCOUNTER — Other Ambulatory Visit: Payer: Self-pay | Admitting: *Deleted

## 2017-09-24 VITALS — BP 128/82 | HR 67 | Temp 99.1°F | Ht 72.0 in

## 2017-09-24 DIAGNOSIS — G4734 Idiopathic sleep related nonobstructive alveolar hypoventilation: Secondary | ICD-10-CM

## 2017-09-24 DIAGNOSIS — R918 Other nonspecific abnormal finding of lung field: Secondary | ICD-10-CM | POA: Insufficient documentation

## 2017-09-24 DIAGNOSIS — J189 Pneumonia, unspecified organism: Secondary | ICD-10-CM | POA: Diagnosis not present

## 2017-09-24 DIAGNOSIS — J9811 Atelectasis: Secondary | ICD-10-CM | POA: Insufficient documentation

## 2017-09-24 DIAGNOSIS — R0600 Dyspnea, unspecified: Secondary | ICD-10-CM | POA: Insufficient documentation

## 2017-09-24 DIAGNOSIS — J411 Mucopurulent chronic bronchitis: Secondary | ICD-10-CM

## 2017-09-24 MED ORDER — LEVOFLOXACIN 750 MG PO TABS: 750.0000 mg | ORAL_TABLET | Freq: Every day | ORAL | 0 refills | Status: AC
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
## 2017-09-24 NOTE — Patient Instructions (Signed)
Levofloxacin 750 mg by mouth or per tube daily for 5 days Increase chest percussion to 3 or 4 times per day for the next few days Resume physical therapy activities as soon as he is able to effectively participate in them Follow-up in 2-3 months

## 2017-11-01 ENCOUNTER — Emergency Department
Admission: EM | Admit: 2017-11-01 | Discharge: 2017-11-02 | Disposition: A | Discharge status: Home / Self Care | Payer: BLUE CROSS/BLUE SHIELD | Attending: Emergency Medicine | Admitting: Emergency Medicine

## 2017-11-01 ENCOUNTER — Other Ambulatory Visit: Payer: Self-pay

## 2017-11-01 ENCOUNTER — Encounter: Payer: Self-pay | Admitting: Emergency Medicine

## 2017-11-01 ENCOUNTER — Emergency Department: Payer: BLUE CROSS/BLUE SHIELD

## 2017-11-01 DIAGNOSIS — E871 Hypo-osmolality and hyponatremia: Secondary | ICD-10-CM | POA: Diagnosis not present

## 2017-11-01 DIAGNOSIS — R079 Chest pain, unspecified: Secondary | ICD-10-CM

## 2017-11-01 DIAGNOSIS — Z87891 Personal history of nicotine dependence: Secondary | ICD-10-CM | POA: Insufficient documentation

## 2017-11-01 DIAGNOSIS — R112 Nausea with vomiting, unspecified: Secondary | ICD-10-CM

## 2017-11-01 LAB — URINALYSIS, COMPLETE (UACMP) WITH MICROSCOPIC
Bilirubin Urine: NEGATIVE
Glucose, UA: NEGATIVE mg/dL
Hgb urine dipstick: NEGATIVE
Ketones, ur: 5 mg/dL — AB
Leukocytes, UA: NEGATIVE
Nitrite: NEGATIVE
Protein, ur: NEGATIVE mg/dL
RBC / HPF: NONE SEEN RBC/hpf (ref 0–5)
Specific Gravity, Urine: 1.005 (ref 1.005–1.030)
pH: 7 (ref 5.0–8.0)

## 2017-11-01 LAB — TROPONIN I: Troponin I: <0.03 ng/mL

## 2017-11-01 LAB — COMPREHENSIVE METABOLIC PANEL WITH GFR
ALT: 30 U/L (ref 17–63)
AST: 23 U/L (ref 15–41)
Albumin: 4 g/dL (ref 3.5–5.0)
Alkaline Phosphatase: 45 U/L (ref 38–126)
Anion gap: 12 (ref 5–15)
BUN: 9 mg/dL (ref 6–20)
CO2: 21 mmol/L — ABNORMAL LOW (ref 22–32)
Calcium: 8.8 mg/dL — ABNORMAL LOW (ref 8.9–10.3)
Chloride: 92 mmol/L — ABNORMAL LOW (ref 101–111)
Creatinine, Ser: 0.69 mg/dL (ref 0.61–1.24)
GFR calc Af Amer: >60 mL/min
GFR calc non Af Amer: >60 mL/min
Glucose, Bld: 85 mg/dL (ref 65–99)
Potassium: 4.3 mmol/L (ref 3.5–5.1)
Sodium: 125 mmol/L — ABNORMAL LOW (ref 135–145)
Total Bilirubin: 0.6 mg/dL (ref 0.3–1.2)
Total Protein: 7.2 g/dL (ref 6.5–8.1)

## 2017-11-01 LAB — CBC
HCT: 35.2 % — ABNORMAL LOW (ref 40.0–52.0)
Hemoglobin: 12.1 g/dL — ABNORMAL LOW (ref 13.0–18.0)
MCH: 28.9 pg (ref 26.0–34.0)
MCHC: 34.3 g/dL (ref 32.0–36.0)
MCV: 84.4 fL (ref 80.0–100.0)
Platelets: 193 K/uL (ref 150–440)
RBC: 4.17 MIL/uL — ABNORMAL LOW (ref 4.40–5.90)
RDW: 13.8 % (ref 11.5–14.5)
WBC: 6.1 K/uL (ref 3.8–10.6)

## 2017-11-01 LAB — DIFFERENTIAL
BASOS ABS: 0 10*3/uL (ref 0–0.1)
Basophils Relative: 1 %
Eosinophils Absolute: 0.1 10*3/uL (ref 0–0.7)
Eosinophils Relative: 1 %
LYMPHS PCT: 28 %
Lymphs Abs: 1.7 10*3/uL (ref 1.0–3.6)
Monocytes Absolute: 0.6 10*3/uL (ref 0.2–1.0)
Monocytes Relative: 11 %
NEUTROS ABS: 3.6 10*3/uL (ref 1.4–6.5)
NEUTROS PCT: 59 %

## 2017-11-01 MED ORDER — HALOPERIDOL LACTATE 5 MG/ML IJ SOLN
INTRAMUSCULAR | Status: AC
  Administered 2017-11-01: 5 mg via INTRAVENOUS
  Filled 2017-11-01: qty 1

## 2017-11-01 MED ORDER — HALOPERIDOL LACTATE 5 MG/ML IJ SOLN
5.0000 mg | Freq: Once | INTRAMUSCULAR | Status: AC
  Administered 2017-11-01: 5 mg via INTRAVENOUS

## 2017-11-01 MED ORDER — SODIUM CHLORIDE 0.9 % IV BOLUS
1000.0000 mL | Freq: Once | INTRAVENOUS | Status: AC
  Administered 2017-11-01: 1000 mL via INTRAVENOUS

## 2017-11-01 NOTE — ED Notes (Signed)
Unable to pulled lab work from IV

## 2017-11-01 NOTE — ED Provider Notes (Signed)
[facility] Emergency Department Provider Note       Time seen: ----------------------------------------- 2:10 PM on 11/01/2017 -----------------------------------------   I have reviewed the triage vital signs and the nursing notes.  HISTORY   Chief Complaint Chest Pain    HPI [name] [name] is a 32 y.o. male with a history of anemia, anxiety, C. difficile colitis, depression, drug abuse, hep C, PE, subarachnoid hemorrhage and TBI who presents to the ED for chest pain and vomiting that began this morning.  Patient is a TBI from over 6 years ago.  According to the caregiver he is unable to communicate his needs effectively.  Caregiver reports some tachycardia at home.  He was placed on Ceftin recently for some fever that family had reported at home, he does have a history of rapidly progressing into sepsis.  He denies any complaints at this time.  Family is concerned he may have aspiration pneumonia.  Past Medical History:  Diagnosis Date  . Anemia   . Anxiety   . C. difficile colitis   . Depression   . Drug abuse (HCC)   . Hepatitis C   . Hepatitis C   . Psychosis (HCC)   . Pulmonary emboli (HCC)   . Scoliosis   . Seizures (HCC)   . Subarachnoid hemorrhage (HCC)   . TBI (traumatic brain injury) (HCC) 07/2011    Patient Active Problem List   Diagnosis Date Noted  . Cellulitis 08/08/2016  . 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 Open wound of male external genitalia 05/02/2016  . Colitis, Clostridium difficile 06/19/2015  . Hypoxia 05/25/2015  . History of pulmonary embolism 05/25/2015  . OSA on CPAP 05/25/2015  . Pressure ulcer 05/25/2015  . Convulsions/seizures (HCC) 02/10/2015  . Aspiration pneumonia (HCC) 02/07/2015  . Encephalopathy, metabolic 02/07/2015  . Skin irritation 11/09/2014  . TBI (traumatic brain injury) (HCC) 02/04/2012  . 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  Heterotopic ossification 02/04/2012    Past Surgical History:  Procedure Laterality Date  . ANKLE SURGERY Bilateral   . BRAIN SURGERY    . COLONOSCOPY WITH PROPOFOL N/A 12/29/2015   Procedure: COLONOSCOPY WITH PROPOFOL;  Surgeon: Scot Junobert T Elliott, MD;  [facility] END[facility]COPY;  Service: Endoscopy;  Laterality: N/A;  . FRACTURE SURGERY     left acetabulum  . ILEOSTOMY    . IVC FILTER PLACEMENT ([facility] HX)    . Jan[place]ay gastrostomy  07/02/2014  . lung tube    . PEG TUBE PLACEMENT    . TRACHEOSTOMY      Allergies Ambien [zolpidem]; Ativan [lorazepam]; Depakote er [divalproex sodium er]; Dilaudid [hydromorphone]; and Keppra [levetiracetam]  Social History Social History   Tobacco Use  . Smoking status: Former Smoker    Last attempt to quit: 08/07/2011    Years since quitting: 6.2  . Smokeless tobacco: Former NeurosurgeonUser    Quit date: 07/30/2011  Substance Use Topics  . Alcohol use: No    Alcohol/week: 0.0 oz  . Drug use: No    Review of Systems Constitutional: Positive for fever Eyes: Negative for vision changes ENT:  Negative for congestion, sore throat Cardiovascular: Positive for chest pain Respiratory: Negative for shortness of breath. Gastrointestinal: Negative for abdominal pain, positive for vomiting Musculoskeletal: Negative for back pain. Skin: Negative for rash. Neurological: Negative for headaches, focal weakness or numbness.  All systems negative/normal/unremarkable except as stated in the HPI  ____________________________________________   PHYSICAL EXAM:  VITAL SIGNS: ED Triage Vitals  Enc Vitals Group     BP 11/01/17 1405 118/78     Pulse --      Resp 11/01/17 1405 18     Temp 11/01/17 1405 98.1 F (36.7 C)     Temp Source 11/01/17 1405 Oral     SpO2 11/01/17 1405 96 %     Weight 11/01/17 1407 240 lb (108.9 kg)     Height --      Head Circumference --      Peak Flow --      Pain Score --      Pain Loc --      Pain Edu? --      Excl. in GC? --     Constitutional: Alert, Well appearing and in no distress. Eyes: Conjunctivae are normal. Normal extraocular movements.  Left pupil is dilated compared to the right ENT   Head: Normocephalic and atraumatic.   Nose: No congestion/rhinnorhea.   Mouth/Throat: Mucous membranes are moist.   Neck: No stridor. Cardiovascular: Normal rate, regular rhythm. No murmurs, rubs, or gallops. Respiratory: Normal respiratory effort without tachypnea nor retractions. Breath sounds are clear and equal bilaterally. No wheezes/rales/rhonchi. Gastrointestinal: Soft and nontender. Normal bowel sounds Musculoskeletal: Nontender with normal range of motion in extremities. No lower extremity tenderness nor edema. Neurologic:  Normal speech and language. No gross focal neurologic deficits are appreciated.  Skin:  Skin is warm, dry and intact. No rash noted. Psychiatric: Mood and affect are normal. Speech and behavior are normal.  ____________________________________________  EKG: Interpreted by me.  Sinus rhythm rate 60 bpm, normal PR interval, normal QRS, normal QT.  ____________________________________________  ED COURSE:  As part of my medical decision making, I reviewed the following data within the electronic MEDICAL RECORD NUMBER History obtained from family if available, nursing notes, old chart and ekg, as well as notes from prior ED visits. Patient presented for chest pain and vomiting, we will assess with labs and imaging as indicated at this time.   Procedures ____________________________________________   LABS (pertinent positives/negatives)  Labs Reviewed  CULTURE, BLOOD (ROUTINE X 2)  CULTURE, BLOOD (ROUTINE X 2)  URINE CULTURE  LACTIC ACID, PLASMA  LACTIC ACID, PLASMA  COMPREHENSIVE METABOLIC PANEL  TROPONIN I  CBC WITH DIFFERENTIAL/PLATELET  BLOOD GAS, VENOUS  URINALYSIS, COMPLETE (UACMP) WITH MICROSCOPIC    RADIOLOGY Chest x-ray Does not reveal any acute  process ____________________________________________  DIFFERENTIAL DIAGNOSIS   Gastroenteritis, dehydration, electrolyte abnormality, sepsis, pneumonia, aspiration pneumonia  FINAL ASSESSMENT AND PLAN  Chest pain, vomiting   Plan: The patient had presented for chest pain and vomiting. Patient's labs are pending at this time. Patient's imaging does not reveal any acute process.  Final disposition is pending, it is difficult to discern the significance of his symptoms at this point.   Ulice Dash, MD   Note: This note was generated in part or whole with voice recognition software. Voice recognition is usually quite accurate but there are transcription errors that can and very often do occur. I apologize for any typographical errors that were not detected and corrected.     Emily Filbert, MD 11/01/17 [phone]

## 2017-11-01 NOTE — ED Triage Notes (Signed)
Pt to ed with c/o chest pain and vomiting this am.  Pt has TBI from over 6 years ago.  Per caregiver, pt is unable to communicate needs effectively.  Per caregiver pt has been tachycardic at home.  EKG done at triage.

## 2017-11-01 NOTE — ED Notes (Signed)
IV team unable to collect labs except for green top tube and venous blood gas. Tubes were went to the lab. MD aware.

## 2017-11-01 NOTE — ED Notes (Addendum)
Unable to find US after paging  Lab called for blood draw

## 2017-11-01 NOTE — ED Notes (Signed)
Patient given a magic cup of chocolate ice cream per request. Patient's mother is at bedside to feed the patient.

## 2017-11-01 NOTE — ED Notes (Signed)
Report given to Noel RN 

## 2017-11-02 LAB — BLOOD GAS, VENOUS
Acid-base deficit: 0.2 mmol/L (ref 0.0–2.0)
Bicarbonate: 21.8 mmol/L (ref 20.0–28.0)
O2 Saturation: 99.8 %
Patient temperature: 37
pCO2, Ven: 28 mmHg — ABNORMAL LOW (ref 44.0–60.0)
pH, Ven: 7.5 — ABNORMAL HIGH (ref 7.250–7.430)
pO2, Ven: 207 mmHg — ABNORMAL HIGH (ref 32.0–45.0)

## 2017-11-02 LAB — BASIC METABOLIC PANEL
Anion gap: 11 (ref 5–15)
BUN: 9 mg/dL (ref 6–20)
CALCIUM: 8.5 mg/dL — AB (ref 8.9–10.3)
CHLORIDE: 95 mmol/L — AB (ref 101–111)
CO2: 20 mmol/L — AB (ref 22–32)
CREATININE: 0.9 mg/dL (ref 0.61–1.24)
GFR calc non Af Amer: >60 mL/min (ref >60)
Glucose, Bld: 86 mg/dL (ref 65–99)
Potassium: 3.9 mmol/L (ref 3.5–5.1)
Sodium: 126 mmol/L — ABNORMAL LOW (ref 135–145)

## 2017-11-02 NOTE — ED Provider Notes (Signed)
-----------------------------------------   12:53 AM on 11/02/2017 -----------------------------------------   Blood pressure 130/90, pulse 80, temperature 98.7 F (37.1 C), temperature source Oral, resp. rate 19, weight 108.9 kg (240 lb), SpO2 95 %.  Assuming care from Dr. Derrill KayGoodman.  In short, [name] [name] is a 32 y.o. male with a chief complaint of Chest Pain .  Refer to the original H&P for additional details.  The plan of care was to follow-up on a basic metabolic panel to see if the patient's sodium came up after some IV fluids.  It has come up very minimally from 125-126.  However, as per my report from Dr. Derrill KayGoodman, the patient recently started a new medication that the doctor who prescribed it specifically warned may cause hyponatremia.  The half-life of the medication is less than 9 hours and the patient has been in the ED for about 11 hours at this point.  I had a long discussion with the patient's mother who is his primary caregiver, who is very responsible and familiar with his care, and she is comfortable taking him home at this time.  I think that is appropriate as there is no indication of any acute or emergent condition and she has the capability of pushing electrolyte fluid such as Pedialyte through his G-tube.  I gave my usual customary return precautions and the family is very comfortable with the plan for discharge.        Loleta RoseForbach, Preslynn Bier, MD 11/02/17 [phone]

## 2017-11-02 NOTE — ED Notes (Signed)
Pt's mother to RN requesting haldol for pt's agitation, EDP notified orders recieved

## 2017-11-02 NOTE — Discharge Instructions (Addendum)
As we discussed, Xanax workup was generally reassuring today except for a low sodium level.  This may be the result of both less intake recently through his G-tube and by mouth, a side effect of the medication he recently started, or both.  We provided some IV fluids and we discussed pushing additional electrolyte solution such as Pedialyte through his G-tube.  We recommend you follow-up with his regular doctor at the next available opportunity and that you hold on the medication in question until you have the opportunity to see his doctor, ask about the hyponatremia (tell them his sodium level was 125-126), and ask about any alternatives.  Return to the emergency department if you develop new or worsening symptoms that concern you.

## 2017-11-02 NOTE — ED Notes (Addendum)
[facility] [place] pt's wheelchair at [phone]

## 2017-11-03 LAB — URINE CULTURE

## 2017-12-29 ENCOUNTER — Encounter: Payer: Self-pay | Admitting: Pulmonary Disease

## 2017-12-29 ENCOUNTER — Ambulatory Visit (INDEPENDENT_AMBULATORY_CARE_PROVIDER_SITE_OTHER): Payer: BLUE CROSS/BLUE SHIELD | Admitting: Pulmonary Disease

## 2017-12-29 VITALS — BP 100/62 | HR 76 | Temp 99.1°F | Ht 72.0 in | Wt 241.0 lb

## 2017-12-29 DIAGNOSIS — R5383 Other fatigue: Secondary | ICD-10-CM

## 2017-12-29 DIAGNOSIS — J690 Pneumonitis due to inhalation of food and vomit: Secondary | ICD-10-CM | POA: Diagnosis not present

## 2017-12-29 DIAGNOSIS — R509 Fever, unspecified: Secondary | ICD-10-CM

## 2017-12-29 MED ORDER — AMOXICILLIN-POT CLAVULANATE 875-125 MG PO TABS: 1.0000 | ORAL_TABLET | Freq: Two times a day (BID) | ORAL | 0 refills | Status: AC

## 2017-12-29 NOTE — Patient Instructions (Signed)
Augmentin twice a day for 7 days Follow-up in 2 to 3 weeks with chest x-ray prior to that visit
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
## 2017-12-30 ENCOUNTER — Other Ambulatory Visit: Payer: Self-pay | Admitting: Pulmonary Disease

## 2018-02-08 ENCOUNTER — Other Ambulatory Visit: Payer: Self-pay

## 2018-02-08 ENCOUNTER — Encounter: Payer: Self-pay | Admitting: Emergency Medicine

## 2018-02-08 ENCOUNTER — Emergency Department: Payer: PRIVATE HEALTH INSURANCE

## 2018-02-08 ENCOUNTER — Emergency Department
Admission: EM | Admit: 2018-02-08 | Discharge: 2018-02-09 | Disposition: A | Discharge status: Home / Self Care | Payer: PRIVATE HEALTH INSURANCE | Attending: Emergency Medicine | Admitting: Emergency Medicine

## 2018-02-08 DIAGNOSIS — Z87891 Personal history of nicotine dependence: Secondary | ICD-10-CM | POA: Insufficient documentation

## 2018-02-08 DIAGNOSIS — N390 Urinary tract infection, site not specified: Secondary | ICD-10-CM | POA: Diagnosis not present

## 2018-02-08 DIAGNOSIS — R112 Nausea with vomiting, unspecified: Secondary | ICD-10-CM

## 2018-02-08 DIAGNOSIS — R079 Chest pain, unspecified: Secondary | ICD-10-CM | POA: Insufficient documentation

## 2018-02-08 DIAGNOSIS — Z7984 Long term (current) use of oral hypoglycemic drugs: Secondary | ICD-10-CM | POA: Diagnosis not present

## 2018-02-08 DIAGNOSIS — Z79899 Other long term (current) drug therapy: Secondary | ICD-10-CM | POA: Insufficient documentation

## 2018-02-08 DIAGNOSIS — Z7901 Long term (current) use of anticoagulants: Secondary | ICD-10-CM | POA: Diagnosis not present

## 2018-02-08 DIAGNOSIS — R0602 Shortness of breath: Secondary | ICD-10-CM | POA: Diagnosis present

## 2018-02-08 LAB — URINALYSIS, ROUTINE W REFLEX MICROSCOPIC
Bilirubin Urine: NEGATIVE
Glucose, UA: NEGATIVE mg/dL
Hgb urine dipstick: NEGATIVE
Ketones, ur: NEGATIVE mg/dL
Nitrite: NEGATIVE
PH: 8 (ref 5.0–8.0)
Protein, ur: NEGATIVE mg/dL
SPECIFIC GRAVITY, URINE: 1.01 (ref 1.005–1.030)

## 2018-02-08 LAB — CBC WITH DIFFERENTIAL/PLATELET
Basophils Absolute: 0.1 10*3/uL (ref 0–0.1)
Basophils Relative: 1 %
Eosinophils Absolute: 0.2 10*3/uL (ref 0–0.7)
Eosinophils Relative: 2 %
HEMATOCRIT: 41.8 % (ref 40.0–52.0)
Hemoglobin: 14.6 g/dL (ref 13.0–18.0)
LYMPHS ABS: 2.9 10*3/uL (ref 1.0–3.6)
LYMPHS PCT: 24 %
MCH: 29.1 pg (ref 26.0–34.0)
MCHC: 35 g/dL (ref 32.0–36.0)
MCV: 83.1 fL (ref 80.0–100.0)
MONO ABS: 1.2 10*3/uL — AB (ref 0.2–1.0)
MONOS PCT: 9 %
NEUTROS ABS: 8.1 10*3/uL — AB (ref 1.4–6.5)
Neutrophils Relative %: 64 %
Platelets: 254 10*3/uL (ref 150–440)
RBC: 5.03 MIL/uL (ref 4.40–5.90)
RDW: 14.7 % — AB (ref 11.5–14.5)
WBC: 12.5 10*3/uL — ABNORMAL HIGH (ref 3.8–10.6)

## 2018-02-08 LAB — COMPREHENSIVE METABOLIC PANEL
ALBUMIN: 4.4 g/dL (ref 3.5–5.0)
ALT: 16 U/L (ref 0–44)
AST: 21 U/L (ref 15–41)
Alkaline Phosphatase: 45 U/L (ref 38–126)
Anion gap: 10 (ref 5–15)
BUN: 8 mg/dL (ref 6–20)
CALCIUM: 9.5 mg/dL (ref 8.9–10.3)
CHLORIDE: 98 mmol/L (ref 98–111)
CO2: 25 mmol/L (ref 22–32)
Creatinine, Ser: 0.71 mg/dL (ref 0.61–1.24)
GFR calc Af Amer: >60 mL/min (ref >60)
GLUCOSE: 85 mg/dL (ref 70–99)
POTASSIUM: 4.5 mmol/L (ref 3.5–5.1)
Sodium: 133 mmol/L — ABNORMAL LOW (ref 135–145)
TOTAL PROTEIN: 7.9 g/dL (ref 6.5–8.1)
Total Bilirubin: 0.7 mg/dL (ref 0.3–1.2)

## 2018-02-08 LAB — TROPONIN I

## 2018-02-08 LAB — LIPASE, BLOOD: Lipase: 26 U/L (ref 11–51)

## 2018-02-08 LAB — LACTIC ACID, PLASMA: LACTIC ACID, VENOUS: 1.6 mmol/L (ref 0.5–1.9)

## 2018-02-08 MED ORDER — CEPHALEXIN 500 MG PO CAPS: 500.0000 mg | ORAL_CAPSULE | Freq: Three times a day (TID) | ORAL | 0 refills | Status: DC

## 2018-02-08 MED ORDER — ONDANSETRON 4 MG PO TBDP: ORAL_TABLET | ORAL | 0 refills | Status: AC

## 2018-02-08 MED ORDER — SODIUM CHLORIDE 0.9 % IV SOLN
1.0000 g | INTRAVENOUS | Status: AC
  Administered 2018-02-08: 1 g via INTRAVENOUS
  Filled 2018-02-08: qty 10

## 2018-02-08 MED ORDER — ONDANSETRON HCL 4 MG/2ML IJ SOLN
4.0000 mg | INTRAMUSCULAR | Status: AC
  Administered 2018-02-08: 4 mg via INTRAVENOUS
  Filled 2018-02-08: qty 2

## 2018-02-08 MED ORDER — SODIUM CHLORIDE 0.9 % IV BOLUS
500.0000 mL | Freq: Once | INTRAVENOUS | Status: AC
  Administered 2018-02-08: 500 mL via INTRAVENOUS

## 2018-02-08 NOTE — ED Provider Notes (Signed)
[facility] Emergency Department Provider Note  ____________________________________________   First MD Initiated Contact with Patient 02/08/18 2157     (approximate)  I have reviewed the triage vital signs and the nursing notes.   HISTORY  Chief Complaint Shortness of Breath and Chest Pain  History from the patient is limited due to the patient's traumatic brain injury.  His mother is his legal guardian and is present at bedside and provided the history.  HPI [name] [name] is a 32 y.o. 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  He presents with his mother for evaluation of a variety of complaints including what appears to be slightly increased work of breathing, sweating episodes, and acting different than his baseline.  His urine has had a foul odor "like fish sticks" recently as well.  His family reports that he is acting the way he does when he has an infection and they are worried about both pneumonia and  Urinary tract infection.  He often does have sweating episodes but this is been much more severe.  He is fed primarily by G-tube and they have been pushing fluids and electrolytes.  He has not had any nausea or vomiting.  He is able to indicate to them that he does feel short of breath.  He typically uses a condom catheter for urination and his family reports that he has been having "difficulty" with urination.  His symptoms are mild to moderate but they are worried about him given his chronic issues.  Nothing in particular makes them better or worse.  Of note, after I evaluated him initially, we were alerted to the fact that the patient was actively vomiting in the exam room.   Past Medical History:  Diagnosis Date  . Anemia   . Anxiety   . C. difficile colitis   . Depression   . Drug abuse (HCC)   . Hepatitis C   . Hepatitis C   .  Psychosis (HCC)   . Pulmonary emboli (HCC)   . Scoliosis   . Seizures (HCC)   . Subarachnoid hemorrhage (HCC)   . TBI (traumatic brain injury) (HCC) 07/2011    Patient Active Problem List   Diagnosis Date Noted  . Cellulitis 08/08/2016  . 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 Open wound of male external genitalia 05/02/2016  . Colitis, Clostridium difficile 06/19/2015  . Hypoxia 05/25/2015  . History of pulmonary embolism 05/25/2015  . OSA on CPAP 05/25/2015  . Pressure ulcer 05/25/2015  . Convulsions/seizures (HCC) 02/10/2015  . Aspiration pneumonia (HCC) 02/07/2015  . Encephalopathy, metabolic 02/07/2015  . Skin irritation 11/09/2014  . TBI (traumatic brain injury) (HCC) 02/04/2012  . 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 Heterotopic ossification 02/04/2012    Past Surgical History:  Procedure Laterality Date  . ANKLE SURGERY Bilateral   . BRAIN SURGERY    . COLONOSCOPY WITH PROPOFOL N/A 12/29/2015   Procedure: COLONOSCOPY WITH PROPOFOL;  Surgeon: Scot Jun, MD;  Location: [facility] ENDOSCOPY;  Service: Endoscopy;  Laterality: N/A;  . FRACTURE SURGERY     left acetabulum  . ILEOSTOMY    . IVC FILTER PLACEMENT ([facility] HX)    . [place] gastrostomy  07/02/2014  . lung tube    . PEG TUBE PLACEMENT    . TRACHEOSTOMY      Prior to Admission  medications   Medication Sig Start Date End Date Taking? Authorizing Provider  acetaminophen (TYLENOL) 325 MG tablet Take 2 tablets (650 mg total) by mouth every 6 (six) hours as needed for mild pain (or Fever >/= 101). 08/12/16  Yes Gouru, Deanna Artis, MD  albuterol (PROVENTIL) (2.5 MG/3ML) 0.083% nebulizer solution USE 1 VIAL IN NEBULIZER 3 TIMES DAILY 12/30/17  Yes Merwyn Katos, MD  apixaban (ELIQUIS) 5 MG TABS tablet Take 5 mg by mouth 2 (two) times daily.    Yes [provider]  baclofen (LIORESAL) 20 MG tablet Take 20 mg by mouth 3 (three) times daily.   Yes  [provider]  bisacodyl (DULCOLAX) 10 MG suppository Place 1 suppository (10 mg total) rectally daily as needed for moderate constipation. Patient taking differently: Place 10 mg rectally daily as needed for mild constipation or moderate constipation.  08/12/16  Yes Gouru, Deanna Artis, MD  doxepin (SINEQUAN) 10 MG capsule Take 30 mg by mouth at bedtime.   Yes [provider]  esomeprazole (NEXIUM) 40 MG capsule Take 1 capsule by mouth 2 (two) times daily. 10/13/17  Yes [provider]  gabapentin (NEURONTIN) 300 MG capsule Take 300 mg by mouth at bedtime.    Yes [provider]  haloperidol (HALDOL) 5 MG tablet Take 5 mg by mouth as needed for agitation.   Yes [provider]  indomethacin (INDOCIN SR) 75 MG CR capsule Take 75 mg by mouth daily with breakfast.   Yes [provider]  lacosamide (VIMPAT) 200 MG TABS tablet Take 200 mg by mouth daily.    Yes [provider]  lurasidone (LATUDA) 80 MG TABS tablet Take 120 mg by mouth daily. Patient takes 1/2 tablet (40mg ) at 8 am and 3 pm   Yes [provider]  metFORMIN (GLUCOPHAGE) 500 MG tablet Take 1 tablet by mouth 3 (three) times daily.  10/29/17  Yes [provider]  Multiple Vitamins-Minerals (MULTIVITAMIN WITH MINERALS) tablet Take 1 tablet by mouth daily.   Yes [provider]  Nutritional Supplements (FEEDING SUPPLEMENT, JEVITY 1.5 CAL/FIBER,) LIQD Place 237 mLs into feeding tube daily. 08/13/16  Yes Gouru, Deanna Artis, MD  propranolol ER (INDERAL LA) 60 MG 24 hr capsule Take 1 capsule (60 mg total) by mouth daily. 06/25/15  Yes Delfino Lovett, MD  tizanidine (ZANAFLEX) 2 MG capsule Take 1 capsule by mouth daily. 10/31/17  Yes [provider]  vitamin B-12 (CYANOCOBALAMIN) 1000 MCG tablet Take 1,000 mcg by mouth daily.   Yes [provider]  Water For Irrigation, Sterile (FREE WATER) SOLN Place 250 mLs into feeding tube 5 (five) times daily. 08/12/16  Yes  Gouru, Deanna Artis, MD  cephALEXin (KEFLEX) 500 MG capsule Take 1 capsule (500 mg total) by mouth 3 (three) times daily. 02/08/18   Loleta Rose, MD  ondansetron (ZOFRAN ODT) 4 MG disintegrating tablet Allow 1-2 tablets to dissolve in your mouth every 8 hours as needed for nausea/vomiting 02/08/18   Loleta Rose, MD    Allergies Ambien [zolpidem]; Ativan [lorazepam]; Depakote er [divalproex sodium er]; Dilaudid [hydromorphone]; and Keppra [levetiracetam]  Family History  Problem Relation Age of Onset  . Hypertension Unknown   . Hyperlipidemia Father   . Autoimmune disease Mother   . Cancer Maternal Grandmother   . Heart disease Maternal Grandfather     Social History Social History   Tobacco Use  . Smoking status: Former Smoker    Last attempt to quit: 08/07/2011    Years since quitting: 6.5  .  Smokeless tobacco: Former Neurosurgeon    Quit date: 07/30/2011  Substance Use Topics  . Alcohol use: No    Alcohol/week: 0.0 oz  . Drug use: No    Review of Systems History from the patient is limited due to the patient's traumatic brain injury.  His mother is his legal guardian and is present at bedside and provided the history.  Constitutional: No fever/chills Eyes: No visual changes.  Has reportedly had a "wild" look in his eyes. Cardiovascular: Denies chest pain. Respiratory: Possible shortness of breath. Gastrointestinal: No abdominal pain.  Vomited in the ED exam room.  No diarrhea.  No constipation. Genitourinary: Foul-smelling urine and difficulty urinating according to family Musculoskeletal: Negative for neck pain.  Negative for back pain. Integumentary: Negative for rash. Neurological: Negative for headaches, focal weakness or numbness.   ____________________________________________   PHYSICAL EXAM:  VITAL SIGNS: ED Triage Vitals  Enc Vitals Group     BP 02/08/18 2040 (!) 124/96     Pulse Rate 02/08/18 2040 76     Resp 02/08/18 2040 (!) 22     Temp 02/08/18 2040 97.7 F (36.5  C)     Temp Source 02/08/18 2040 Axillary     SpO2 02/08/18 2040 95 %     Weight 02/08/18 2038 106.6 kg (235 lb)     Height 02/08/18 2038 1.803 m (5\' 11" )     Head Circumference --      Peak Flow --      Pain Score 02/08/18 2038 0     Pain Loc --      Pain Edu? --      Excl. in GC? --     Constitutional: The patient is essentially at his baseline, alert and able to answer some simple questions and seems to track conversation.   Eyes: Conjunctivae are normal.  Disconjugate gaze at baseline Head: Atraumatic. Nose: No congestion/rhinnorhea. Mouth/Throat: Mucous membranes are moist. Neck: No stridor.  No meningeal signs.   Cardiovascular: Normal rate, regular rhythm. Good peripheral circulation. Grossly normal heart sounds. Respiratory: Normal respiratory effort.  No retractions. Lungs CTAB.  Slightly increased respiratory rate Gastrointestinal: Soft and nontender. No distention.  Musculoskeletal: Muscle wasting in upper and lower extremities.  No acute abnormalities visualized Neurologic: Contractures of his extremities at baseline with some movements.  Unable to participate in extensive neurological exam. Skin:  Skin is warm, diaphoretic and intact. No rash noted.   ____________________________________________   LABS (all labs ordered are listed, but only abnormal results are displayed)  Labs Reviewed  CBC WITH DIFFERENTIAL/PLATELET - Abnormal; Notable for the following components:      Result Value   WBC 12.5 (*)    RDW 14.7 (*)    Neutro Abs 8.1 (*)    Monocytes Absolute 1.2 (*)    All other components within normal limits  COMPREHENSIVE METABOLIC PANEL - Abnormal; Notable for the following components:   Sodium 133 (*)    All other components within normal limits  URINALYSIS, ROUTINE W REFLEX MICROSCOPIC - Abnormal; Notable for the following components:   Color, Urine YELLOW (*)    APPearance HAZY (*)    Leukocytes, UA SMALL (*)    Bacteria, UA RARE (*)    All other  components within normal limits  URINE CULTURE  LIPASE, BLOOD  TROPONIN I  LACTIC ACID, PLASMA   ____________________________________________  EKG  ED ECG REPORT I, Loleta Rose, the attending physician, personally viewed and interpreted this ECG.  Date: 02/08/2018 EKG Time:  20:37 Rate: 77 Rhythm: normal sinus rhythm with heavy artifact QRS Axis: normal Intervals: normal ST/T Wave abnormalities: Non-specific ST segment / T-wave changes, but no evidence of acute ischemia. Narrative Interpretation: no evidence of acute ischemia   ____________________________________________  RADIOLOGY I, Loleta Rose, personally viewed and evaluated these images (plain radiographs) as part of my medical decision making, as well as reviewing the written report by the radiologist.  ED MD interpretation: No evidence of acute abnormality on chest x-ray  Official radiology report(s): Dg Chest Portable 1 View  Result Date: 02/08/2018 CLINICAL DATA:  Labored breathing over the last 2 days.  Chest pain. EXAM: PORTABLE CHEST 1 VIEW COMPARISON:  11/01/2017 FINDINGS: Poor inspiration. Allowing for that, the heart and mediastinal shadows are normal in the lungs are clear. No sign of infiltrate, collapse or effusion. IMPRESSION: Poor inspiration.  No active disease. Electronically Signed   By: Paulina Fusi M.D.   On: 02/08/2018 21:47    ____________________________________________   PROCEDURES  Critical Care performed: No   Procedure(s) performed:   Procedures   ____________________________________________   INITIAL IMPRESSION / ASSESSMENT AND PLAN / ED COURSE  As part of my medical decision making, I reviewed the following data within the electronic MEDICAL RECORD NUMBER History obtained from family, Nursing notes reviewed and incorporated, Labs reviewed , EKG interpreted , Old chart reviewed, Radiograph reviewed  and Notes from prior ED visits    Differential diagnosis includes, but is not  limited to, healthcare associated pneumonia, urinary tract infection, PE, viral infection, much less likely ACS.  The patient's vital signs are reassuring with no tachycardia, hypoxemia, nor fever.  Chest x-ray appears clear.  The patient has a mild leukocytosis of 12.5 and what appears to be a relatively mild urinary tract infection.  Lactic acid is within normal limits.  Comprehensive metabolic panel is unremarkable and reassuring.  Under the circumstances, he does not meet sepsis criteria, and I believe his symptoms are likely secondary to the urinary tract infection.  I discussed additional imaging with the mother, who has good instincts about his health, and she is comfortable without any additional imaging at this time (such as a CT scan of the chest or abdomen).  I gave a dose of ceftriaxone 1 g IV and a prescription for Keflex and Zofran to be given through the gastric tube.  They will follow-up with his outpatient provider at the next available opportunity and I gave my usual and customary return precautions.  They are comfortable with the plan.     ____________________________________________  FINAL CLINICAL IMPRESSION(S) / ED DIAGNOSES  Final diagnoses:  Urinary tract infection without hematuria, site unspecified  Nausea and vomiting, intractability of vomiting not specified, unspecified vomiting type     MEDICATIONS GIVEN DURING THIS VISIT:  Medications  ondansetron (ZOFRAN) injection 4 mg (4 mg Intravenous Given 02/08/18 2355)  sodium chloride 0.9 % bolus 500 mL (0 mLs Intravenous Stopped 02/09/18 0046)  cefTRIAXone (ROCEPHIN) 1 g in sodium chloride 0.9 % 100 mL IVPB (0 g Intravenous Stopped 02/09/18 0046)     ED Discharge Orders        Ordered    cephALEXin (KEFLEX) 500 MG capsule  3 times daily     02/08/18 2351    ondansetron (ZOFRAN ODT) 4 MG disintegrating tablet     02/08/18 2351       Note:  This document was prepared using Dragon voice recognition software and may  include unintentional dictation errors.    Loleta Rose,  MD 02/09/18 [id number]

## 2018-02-08 NOTE — Discharge Instructions (Signed)
As we discussed, the only significant abnormality we discovered on [name] [name]'s work-up today is a urinary tract infection.  He was given a first dose of antibiotics (ceftriaxone 1 g IV) and we provided a prescription for Keflex 500 mg 3 times a day for 12 days.  We also provided a prescription for Zofran if he has any additional nausea or vomiting; please use as directed and as needed.  Continue to provide appropriate nutrition and hydration per his gastric tube.  Follow-up with your primary care doctor at the next available opportunity.  Return to the emergency department if you develop new or worsening symptoms that concern you.

## 2018-02-08 NOTE — ED Triage Notes (Addendum)
Mother reports labored breathing since Friday and chest pain.  Reports history of sepsis and PE in the past.  Also reports difficulty with urinating.

## 2018-02-10 ENCOUNTER — Ambulatory Visit: Payer: BLUE CROSS/BLUE SHIELD | Admitting: Pulmonary Disease

## 2018-02-10 ENCOUNTER — Encounter

## 2018-02-11 ENCOUNTER — Other Ambulatory Visit: Payer: Self-pay | Admitting: Internal Medicine

## 2018-02-11 ENCOUNTER — Ambulatory Visit
Admission: RE | Admit: 2018-02-11 | Discharge: 2018-02-11 | Disposition: A | Discharge status: Home / Self Care | Payer: PRIVATE HEALTH INSURANCE | Source: Ambulatory Visit | Attending: Internal Medicine | Admitting: Internal Medicine

## 2018-02-11 ENCOUNTER — Ambulatory Visit: Payer: BLUE CROSS/BLUE SHIELD | Admitting: Internal Medicine

## 2018-02-11 DIAGNOSIS — N3090 Cystitis, unspecified without hematuria: Secondary | ICD-10-CM | POA: Insufficient documentation

## 2018-02-11 DIAGNOSIS — R0609 Other forms of dyspnea: Secondary | ICD-10-CM | POA: Diagnosis not present

## 2018-02-11 MED ORDER — IOPAMIDOL (ISOVUE-370) INJECTION 76%
75.0000 mL | Freq: Once | INTRAVENOUS | Status: AC | PRN
  Administered 2018-02-11: 75 mL via INTRAVENOUS

## 2018-02-12 ENCOUNTER — Other Ambulatory Visit: Payer: Self-pay

## 2018-02-12 ENCOUNTER — Emergency Department
Admission: EM | Admit: 2018-02-12 | Discharge: 2018-02-12 | Disposition: A | Discharge status: Other Acute Inpatient Hospital | Payer: PRIVATE HEALTH INSURANCE | Attending: Student in an Organized Health Care Education/Training Program | Admitting: Student in an Organized Health Care Education/Training Program

## 2018-02-12 ENCOUNTER — Emergency Department: Payer: PRIVATE HEALTH INSURANCE

## 2018-02-12 DIAGNOSIS — G9340 Encephalopathy, unspecified: Secondary | ICD-10-CM | POA: Insufficient documentation

## 2018-02-12 DIAGNOSIS — Z79899 Other long term (current) drug therapy: Secondary | ICD-10-CM | POA: Diagnosis not present

## 2018-02-12 DIAGNOSIS — Z7984 Long term (current) use of oral hypoglycemic drugs: Secondary | ICD-10-CM | POA: Insufficient documentation

## 2018-02-12 DIAGNOSIS — Z87891 Personal history of nicotine dependence: Secondary | ICD-10-CM | POA: Diagnosis not present

## 2018-02-12 DIAGNOSIS — R252 Cramp and spasm: Secondary | ICD-10-CM

## 2018-02-12 LAB — URINE CULTURE
Culture: >=100000 — AB
Special Requests: NORMAL

## 2018-02-12 LAB — COMPREHENSIVE METABOLIC PANEL
ALT: 16 U/L (ref 0–44)
AST: 15 U/L (ref 15–41)
Albumin: 3.9 g/dL (ref 3.5–5.0)
Alkaline Phosphatase: 42 U/L (ref 38–126)
Anion gap: 9 (ref 5–15)
BUN: 10 mg/dL (ref 6–20)
CHLORIDE: 95 mmol/L — AB (ref 98–111)
CO2: 28 mmol/L (ref 22–32)
Calcium: 9.2 mg/dL (ref 8.9–10.3)
Creatinine, Ser: 0.66 mg/dL (ref 0.61–1.24)
GFR calc Af Amer: >60 mL/min (ref >60)
GFR calc non Af Amer: >60 mL/min (ref >60)
Glucose, Bld: 104 mg/dL — ABNORMAL HIGH (ref 70–99)
POTASSIUM: 4.2 mmol/L (ref 3.5–5.1)
SODIUM: 132 mmol/L — AB (ref 135–145)
Total Bilirubin: 0.7 mg/dL (ref 0.3–1.2)
Total Protein: 7.7 g/dL (ref 6.5–8.1)

## 2018-02-12 LAB — URINALYSIS, COMPLETE (UACMP) WITH MICROSCOPIC
Bacteria, UA: NONE SEEN
Bilirubin Urine: NEGATIVE
Glucose, UA: NEGATIVE mg/dL
Hgb urine dipstick: NEGATIVE
KETONES UR: 5 mg/dL — AB
NITRITE: NEGATIVE
PROTEIN: NEGATIVE mg/dL
SQUAMOUS EPITHELIAL / LPF: NONE SEEN (ref 0–5)
Specific Gravity, Urine: 1.005 (ref 1.005–1.030)
pH: 8 (ref 5.0–8.0)

## 2018-02-12 LAB — CBC WITH DIFFERENTIAL/PLATELET
BASOS ABS: 0.1 10*3/uL (ref 0–0.1)
Basophils Relative: 1 %
Eosinophils Absolute: 0.1 10*3/uL (ref 0–0.7)
Eosinophils Relative: 1 %
HEMATOCRIT: 38.3 % — AB (ref 40.0–52.0)
Hemoglobin: 12.9 g/dL — ABNORMAL LOW (ref 13.0–18.0)
LYMPHS ABS: 1.2 10*3/uL (ref 1.0–3.6)
LYMPHS PCT: 18 %
MCH: 28.4 pg (ref 26.0–34.0)
MCHC: 33.8 g/dL (ref 32.0–36.0)
MCV: 84 fL (ref 80.0–100.0)
Monocytes Absolute: 0.6 10*3/uL (ref 0.2–1.0)
Monocytes Relative: 8 %
NEUTROS ABS: 4.9 10*3/uL (ref 1.4–6.5)
Neutrophils Relative %: 72 %
Platelets: 291 10*3/uL (ref 150–440)
RBC: 4.56 MIL/uL (ref 4.40–5.90)
RDW: 14.6 % — ABNORMAL HIGH (ref 11.5–14.5)
WBC: 6.8 10*3/uL (ref 3.8–10.6)

## 2018-02-12 LAB — PROCALCITONIN: Procalcitonin: <0.1 ng/mL

## 2018-02-12 LAB — CK: CK TOTAL: 121 U/L (ref 49–397)

## 2018-02-12 LAB — INFLUENZA PANEL BY PCR (TYPE A & B)
INFLBPCR: NEGATIVE
Influenza A By PCR: NEGATIVE

## 2018-02-12 LAB — LACTIC ACID, PLASMA: LACTIC ACID, VENOUS: 1 mmol/L (ref 0.5–1.9)

## 2018-02-12 LAB — TROPONIN I: Troponin I: <0.03 ng/mL (ref <0.03)

## 2018-02-12 MED ORDER — BACLOFEN 1 MG/ML ORAL SUSPENSION
40.0000 mg | Freq: Three times a day (TID) | ORAL | Status: DC
  Administered 2018-02-12: 40 mg via ORAL
  Filled 2018-02-12: qty 4

## 2018-02-12 MED ORDER — LIDOCAINE HCL (PF) 1 % IJ SOLN
5.0000 mL | Freq: Once | INTRAMUSCULAR | Status: AC
  Administered 2018-02-12: 5 mL via INTRADERMAL
  Filled 2018-02-12: qty 5

## 2018-02-12 NOTE — ED Notes (Signed)
Pt is difficult stick.  1 attempt made by this RN.  Dr. Roxan Hockeyobinson at bedside with US.

## 2018-02-12 NOTE — ED Notes (Signed)
Called pharmacy and spoke with Lorin PicketScott and he stated they would send up medication.

## 2018-02-12 NOTE — ED Notes (Signed)
Talked to [facility] from Pharmacy and informed him that we needed the medication per MD Roxan Hockeyobinson.

## 2018-02-12 NOTE — ED Notes (Signed)
Pt did not have BM or diarrhea while in ED.

## 2018-02-12 NOTE — ED Provider Notes (Signed)
[facility] Emergency Department Provider Note    First MD Initiated Contact with Patient 02/12/18 1533     (approximate)  I have reviewed the triage vital signs and the nursing notes.   HISTORY  Chief Complaint Chest Pain and Emesis    HPI [name] [name] is a 32 y.o. male extensive past medical history as listed below well-known to this facility presents the ER with chief complaint of increased spasticity, confusion fevers cold sweats multiple episodes of watery diarrhea and vomiting.  The symptoms have gotten progressively worse.  He was diagnosed with a urinary tract infection that grew out Klebsiella on culture.  Followed up with his PCP and was told that this is not related to a urinary tract infection.  Family states they have noticed worsening symptoms and deterioration in his condition with persistent fevers greater than 100.5.  Due to his deterioration they brought the patient to the ER for further evaluation.  He does have a baclofen pump but has not had that adjusted or any changes made to that in the past several months.    Past Medical History:  Diagnosis Date  . Anemia   . Anxiety   . C. difficile colitis   . Depression   . Drug abuse (HCC)   . Hepatitis C   . Hepatitis C   . Psychosis (HCC)   . Pulmonary emboli (HCC)   . Scoliosis   . Seizures (HCC)   . Subarachnoid hemorrhage (HCC)   . TBI (traumatic brain injury) (HCC) 07/2011   Family History  Problem Relation Age of Onset  . Hypertension Unknown   . Hyperlipidemia Father   . Autoimmune disease Mother   . Cancer Maternal Grandmother   . Heart disease Maternal Grandfather    Past Surgical History:  Procedure Laterality Date  . ANKLE SURGERY Bilateral   . BRAIN SURGERY    . COLONOSCOPY WITH PROPOFOL N/A 12/29/2015   Procedure: COLONOSCOPY WITH PROPOFOL;  Surgeon: Scot Jun, MD;  Location: [facility] ENDOSCOPY;  Service: Endoscopy;  Laterality: N/A;  . FRACTURE SURGERY     left  acetabulum  . ILEOSTOMY    . IVC FILTER PLACEMENT ([facility] HX)    . [place] gastrostomy  07/02/2014  . lung tube    . PEG TUBE PLACEMENT    . TRACHEOSTOMY     Patient Active Problem List   Diagnosis Date Noted  . Cellulitis 08/08/2016  . 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 Open wound of male external genitalia 05/02/2016  . Colitis, Clostridium difficile 06/19/2015  . Hypoxia 05/25/2015  . History of pulmonary embolism 05/25/2015  . OSA on CPAP 05/25/2015  . Pressure ulcer 05/25/2015  . Convulsions/seizures (HCC) 02/10/2015  . Aspiration pneumonia (HCC) 02/07/2015  . Encephalopathy, metabolic 02/07/2015  . Skin irritation 11/09/2014  . TBI (traumatic brain injury) (HCC) 02/04/2012  . 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 Heterotopic ossification 02/04/2012      Prior to Admission medications   Medication Sig Start Date End Date Taking? Authorizing Provider  acetaminophen (TYLENOL) 325 MG tablet Take 2 tablets (650 mg total) by mouth every 6 (six) hours as needed for mild pain (or Fever >/= 101). 08/12/16   Gouru, Deanna Artis, MD  albuterol (PROVENTIL) (2.5 MG/3ML) 0.083% nebulizer solution USE 1 VIAL IN NEBULIZER 3 TIMES DAILY 12/30/17   Merwyn Katos, MD  apixaban (ELIQUIS) 5 MG TABS tablet Take 5 mg by mouth  2 (two) times daily.     [provider]  baclofen (LIORESAL) 20 MG tablet Take 20 mg by mouth 3 (three) times daily.    [provider]  bisacodyl (DULCOLAX) 10 MG suppository Place 1 suppository (10 mg total) rectally daily as needed for moderate constipation. Patient taking differently: Place 10 mg rectally daily as needed for mild constipation or moderate constipation.  08/12/16   Gouru, Deanna Artis, MD  cephALEXin (KEFLEX) 500 MG capsule Take 1 capsule (500 mg total) by mouth 3 (three) times daily. 02/08/18   Loleta Rose, MD  doxepin (SINEQUAN) 10 MG capsule Take 30 mg by mouth at bedtime.     [provider]  esomeprazole (NEXIUM) 40 MG capsule Take 1 capsule by mouth 2 (two) times daily. 10/13/17   [provider]  gabapentin (NEURONTIN) 300 MG capsule Take 300 mg by mouth at bedtime.     [provider]  haloperidol (HALDOL) 5 MG tablet Take 5 mg by mouth as needed for agitation.    [provider]  indomethacin (INDOCIN SR) 75 MG CR capsule Take 75 mg by mouth daily with breakfast.    [provider]  lacosamide (VIMPAT) 200 MG TABS tablet Take 200 mg by mouth daily.     [provider]  lurasidone (LATUDA) 80 MG TABS tablet Take 120 mg by mouth daily. Patient takes 1/2 tablet (40mg ) at 8 am and 3 pm    [provider]  metFORMIN (GLUCOPHAGE) 500 MG tablet Take 1 tablet by mouth 3 (three) times daily.  10/29/17   [provider]  Multiple Vitamins-Minerals (MULTIVITAMIN WITH MINERALS) tablet Take 1 tablet by mouth daily.    [provider]  Nutritional Supplements (FEEDING SUPPLEMENT, JEVITY 1.5 CAL/FIBER,) LIQD Place 237 mLs into feeding tube daily. 08/13/16   Ramonita Lab, MD  ondansetron (ZOFRAN ODT) 4 MG disintegrating tablet Allow 1-2 tablets to dissolve in your mouth every 8 hours as needed for nausea/vomiting 02/08/18   Loleta Rose, MD  propranolol ER (INDERAL LA) 60 MG 24 hr capsule Take 1 capsule (60 mg total) by mouth daily. 06/25/15   Delfino Lovett, MD  tizanidine (ZANAFLEX) 2 MG capsule Take 1 capsule by mouth daily. 10/31/17   [provider]  vitamin B-12 (CYANOCOBALAMIN) 1000 MCG tablet Take 1,000 mcg by mouth daily.    [provider]  Water For Irrigation, Sterile (FREE WATER) SOLN Place 250 mLs into feeding tube 5 (five) times daily. 08/12/16   Ramonita Lab, MD    Allergies Ambien [zolpidem]; Ativan [lorazepam]; Depakote er [divalproex sodium er]; Dilaudid [hydromorphone]; and Keppra [levetiracetam]    Social History Social History   Tobacco Use  . Smoking status:  Former Smoker    Last attempt to quit: 08/07/2011    Years since quitting: 6.5  . Smokeless tobacco: Former Neurosurgeon    Quit date: 07/30/2011  Substance Use Topics  . Alcohol use: No    Alcohol/week: 0.0 oz  . Drug use: No    Review of Systems Patient denies headaches, rhinorrhea, blurry vision, numbness, shortness of breath, chest pain, edema, cough, abdominal pain, nausea, vomiting, diarrhea, dysuria, fevers, rashes or hallucinations unless otherwise stated above in HPI. ____________________________________________   PHYSICAL EXAM:  VITAL SIGNS: Vitals:   02/12/18 2030 02/12/18 2032  BP:  115/80  Pulse: 74 80  Resp: (!) 22 18  Temp:  98.5 F (36.9 C)  SpO2: 98% 97%    Constitutional: Alert and at baseline  Eyes: Conjunctivae are  normal.  Head: s/p previous crani Nose: No congestion/rhinnorhea. Mouth/Throat: Mucous membranes are moist.   Neck: No stridor. Painless ROM.  Cardiovascular: Normal rate, regular rhythm. Grossly normal heart sounds.  Good peripheral circulation. Respiratory: Normal respiratory effort.  No retractions. Lungs CTAB. Gastrointestinal: Soft and nontender. No distention. No abdominal bruits. No CVA tenderness.  No erythema or ttp of baclofen pump Genitourinary: deferred Musculoskeletal: No lower extremity tenderness nor edema.  No joint effusions. Neurologic:  Chronic tetraplegia with chronic spasticity Skin:  Skin is warm, dry and intact. No rash noted. Psychiatric: unable to assss  ____________________________________________   LABS (all labs ordered are listed, but only abnormal results are displayed)  Results for orders placed or performed during the hospital encounter of 02/12/18 (from the past 24 hour(s))  Influenza panel by PCR (type A & B)     Status: None   Collection Time: 02/12/18  4:20 PM  Result Value Ref Range   Influenza A By PCR NEGATIVE NEGATIVE   Influenza B By PCR NEGATIVE NEGATIVE  Lactic acid, plasma     Status: None    Collection Time: 02/12/18  4:20 PM  Result Value Ref Range   Lactic Acid, Venous 1.0 0.5 - 1.9 mmol/L  Comprehensive metabolic panel     Status: Abnormal   Collection Time: 02/12/18  4:20 PM  Result Value Ref Range   Sodium 132 (L) 135 - 145 mmol/L   Potassium 4.2 3.5 - 5.1 mmol/L   Chloride 95 (L) 98 - 111 mmol/L   CO2 28 22 - 32 mmol/L   Glucose, Bld 104 (H) 70 - 99 mg/dL   BUN 10 6 - 20 mg/dL   Creatinine, Ser 0.98 0.61 - 1.24 mg/dL   Calcium 9.2 8.9 - 11.9 mg/dL   Total Protein 7.7 6.5 - 8.1 g/dL   Albumin 3.9 3.5 - 5.0 g/dL   AST 15 15 - 41 U/L   ALT 16 0 - 44 U/L   Alkaline Phosphatase 42 38 - 126 U/L   Total Bilirubin 0.7 0.3 - 1.2 mg/dL   GFR calc non Af Amer >60 >60 mL/min   GFR calc Af Amer >60 >60 mL/min   Anion gap 9 5 - 15  Troponin I     Status: None   Collection Time: 02/12/18  4:20 PM  Result Value Ref Range   Troponin I <0.03 <0.03 ng/mL  CBC WITH DIFFERENTIAL     Status: Abnormal   Collection Time: 02/12/18  4:20 PM  Result Value Ref Range   WBC 6.8 3.8 - 10.6 K/uL   RBC 4.56 4.40 - 5.90 MIL/uL   Hemoglobin 12.9 (L) 13.0 - 18.0 g/dL   HCT 14.7 (L) 82.9 - 56.2 %   MCV 84.0 80.0 - 100.0 fL   MCH 28.4 26.0 - 34.0 pg   MCHC 33.8 32.0 - 36.0 g/dL   RDW 13.0 (H) 86.5 - 78.4 %   Platelets 291 150 - 440 K/uL   Neutrophils Relative % 72 %   Neutro Abs 4.9 1.4 - 6.5 K/uL   Lymphocytes Relative 18 %   Lymphs Abs 1.2 1.0 - 3.6 K/uL   Monocytes Relative 8 %   Monocytes Absolute 0.6 0.2 - 1.0 K/uL   Eosinophils Relative 1 %   Eosinophils Absolute 0.1 0 - 0.7 K/uL   Basophils Relative 1 %   Basophils Absolute 0.1 0 - 0.1 K/uL  Procalcitonin     Status: None   Collection Time: 02/12/18  4:20 PM  Result Value Ref Range   Procalcitonin <0.10 ng/mL  Urinalysis, Complete w Microscopic     Status: Abnormal   Collection Time: 02/12/18  4:20 PM  Result Value Ref Range   Color, Urine STRAW (A) YELLOW   APPearance CLEAR (A) CLEAR   Specific Gravity, Urine 1.005  1.005 - 1.030   pH 8.0 5.0 - 8.0   Glucose, UA NEGATIVE NEGATIVE mg/dL   Hgb urine dipstick NEGATIVE NEGATIVE   Bilirubin Urine NEGATIVE NEGATIVE   Ketones, ur 5 (A) NEGATIVE mg/dL   Protein, ur NEGATIVE NEGATIVE mg/dL   Nitrite NEGATIVE NEGATIVE   Leukocytes, UA TRACE (A) NEGATIVE   RBC / HPF 0-5 0 - 5 RBC/hpf   WBC, UA 0-5 0 - 5 WBC/hpf   Bacteria, UA NONE SEEN NONE SEEN   Squamous Epithelial / LPF NONE SEEN 0 - 5  CK     Status: None   Collection Time: 02/12/18  4:20 PM  Result Value Ref Range   Total CK 121 49 - 397 U/L   ____________________________________________  EKG My review and personal interpretation at Time: 15:55   Indication: ams,sepsis  Rate: 75  Rhythm: sinus Axis: normal Other: normal intervals, no stemi ____________________________________________  RADIOLOGY  I personally reviewed all radiographic images ordered to evaluate for the above acute complaints and reviewed radiology reports and findings.  These findings were personally discussed with the patient.  Please see medical record for radiology report.  ____________________________________________   PROCEDURES  Procedure(s) performed:  .Critical Care Performed by: Willy Eddyobinson, Carsen Leaf, MD Authorized by: Willy Eddyobinson, Yentl Verge, MD   Critical care provider statement:    Critical care time (minutes):  30   Critical care time was exclusive of:  Separately billable procedures and treating other patients   Critical care was necessary to treat or prevent imminent or life-threatening deterioration of the following conditions:  CNS failure or compromise   Critical care was time spent personally by me on the following activities:  Development of treatment plan with patient or surrogate, discussions with consultants, evaluation of patient's response to treatment, examination of patient, obtaining history from patient or surrogate, ordering and performing treatments and interventions, ordering and review of laboratory  studies, ordering and review of radiographic studies, pulse oximetry, re-evaluation of patient's condition and review of old charts   Due to difficulty with obtaining IV access, a 20G peripheral IV catheter was inserted using US guidance into the right forearm.  The site was prepped with chlorhexidine and allowed to dry.  The patient tolerated the procedure without any complications.    Critical Care performed: yes ____________________________________________   INITIAL IMPRESSION / ASSESSMENT AND PLAN / ED COURSE  Pertinent labs & imaging results that were available during my care of the patient were reviewed by me and considered in my medical decision making (see chart for details).   DDX: Dehydration, sepsis, pna, uti, hypoglycemia, cva, drug effect, withdrawal, encephalitis   Fayrene [name] Archila is a 32 y.o. who presents to the ED with stents have been complicated past medical history presents the ER with symptoms as above.  Seems less clinically consistent with infectious process.  Certainly no evidence of encephalitis or meningitis.  Blood work will be sent for the above differential.  Fevers certainly possibly secondary to persistent UTI will repeat urinalysis.  No new focal deficits to indicate repeat CT imaging.  Seems more clinically concerning for baclofen withdrawal or subclinical seizures.  Clinical Course as of Feb 12 2149  Thu Feb 12, 2018  1836 Discussed case with Dr. Malen Gauze, the patient's neuro psychiatrist, who agrees that patient should be admitted to the hospital for observation on EEG monitoring due to concern for temporal limbic seizures or subclinical seizures.  Thus far there is no evidence of septic process but due to his worsening condition on multiple medications do believe that this would benefit from further medical work-up.   [PR]  1903 I discussed case with Dr. Micael Hampshire of the ER as well as Dr. Regino Schultze the neuro hospitalist at [facility].  They kindly agreed to accept patient to  the ER for reassessment.  I am re-dosing the patient's baclofen.  At this point there is no infectious markers.  This does not appear clinically consistent with meningitis.  Some clinical seizures does remain on the differential.  At this point he does appear clinically stable for further evaluation and management.   [PR]    Clinical Course User Index [PR] Willy Eddy, MD     As part of my medical decision making, I reviewed the following data within the electronic MEDICAL RECORD NUMBER Nursing notes reviewed and incorporated, Labs reviewed, notes from prior ED visits.  ____________________________________________   FINAL CLINICAL IMPRESSION(S) / ED DIAGNOSES  Final diagnoses:  Spasticity  Acute encephalopathy      NEW MEDICATIONS STARTED DURING THIS VISIT:  Discharge Medication List as of 02/12/2018  8:54 PM       Note:  This document was prepared using Dragon voice recognition software and may include unintentional dictation errors.    Willy Eddy, MD 02/12/18 2150

## 2018-02-12 NOTE — ED Triage Notes (Addendum)
Pt comes via POV with right sided non radiating CP, SHOB, sweating, nausea, diarrhea, and vomiting since last Friday. Pt was seen here recently for same symptoms.   Pt diagnosed with UTI and sent home and prescribed medication but only took for 3 days and then went to primary care and that doctor switched his medications. Pt started taking new medications.   Per Family they feel pt is gotten worse and not better. Pt is alert.

## 2018-02-12 NOTE — ED Notes (Signed)
EMTALA reviewed by this RN.  

## 2018-02-12 NOTE — ED Notes (Signed)
ED Provider at bedside. 

## 2018-02-12 NOTE — ED Notes (Signed)
Pt given applesauce to take with baclofen

## 2018-02-13 NOTE — Progress Notes (Signed)
ED Antimicrobial Stewardship Positive Culture Follow Up   [name] [name] is an 32 y.o. male who presented to [facility] on 02/12/2018 with a chief complaint of  Chief Complaint  Patient presents with  . Chest Pain  . Emesis    Recent Results (from the past 720 hour(s))  Urine Culture     Status: Abnormal   Collection Time: 02/08/18  9:29 PM  Result Value Ref Range Status   Specimen Description   Final    URINE, CATHETERIZED Performed at [facility], [address] [place], [place] [address]    Special Requests   Final    Normal Performed at [facility], [address] [place], [place] [address]    Culture (A)  Final    >=100,000 COLONIES/mL KLEBSIELLA PNEUMONIAE >=100,000 COLONIES/mL ACINETOBACTER CALCOACETICUS/BAUMANNII COMPLEX    Report Status 02/12/2018 FINAL  Final   Organism ID, Bacteria KLEBSIELLA PNEUMONIAE (A)  Final   Organism ID, Bacteria ACINETOBACTER CALCOACETICUS/BAUMANNII COMPLEX (A)  Final      Susceptibility   Acinetobacter calcoaceticus/baumannii complex - MIC*    CEFTAZIDIME 4 SENSITIVE Sensitive     CEFTRIAXONE 16 INTERMEDIATE Intermediate     CIPROFLOXACIN <=0.25 SENSITIVE Sensitive     GENTAMICIN <=1 SENSITIVE Sensitive     IMIPENEM <=0.25 SENSITIVE Sensitive     PIP/TAZO <=4 SENSITIVE Sensitive     TRIMETH/SULFA <=20 SENSITIVE Sensitive     CEFEPIME 2 SENSITIVE Sensitive     AMPICILLIN/SULBACTAM <=2 SENSITIVE Sensitive     * >=100,000 COLONIES/mL ACINETOBACTER CALCOACETICUS/BAUMANNII COMPLEX   Klebsiella pneumoniae - MIC*    AMPICILLIN RESISTANT Resistant     CEFAZOLIN <=4 SENSITIVE Sensitive     CEFTRIAXONE <=1 SENSITIVE Sensitive     CIPROFLOXACIN <=0.25 SENSITIVE Sensitive     GENTAMICIN <=1 SENSITIVE Sensitive     IMIPENEM <=0.25 SENSITIVE Sensitive     NITROFURANTOIN 32 SENSITIVE Sensitive     TRIMETH/SULFA <=20 SENSITIVE Sensitive     AMPICILLIN/SULBACTAM 4 SENSITIVE Sensitive     PIP/TAZO <=4 SENSITIVE Sensitive    Extended ESBL NEGATIVE Sensitive     * >=100,000 COLONIES/mL KLEBSIELLA PNEUMONIAE  Blood Culture (routine x 2)     Status: None (Preliminary result)   Collection Time: 02/12/18  4:20 PM  Result Value Ref Range Status   Specimen Description BLOOD BLOOD RIGHT FOREARM  Final   Special Requests   Final    BOTTLES DRAWN AEROBIC AND ANAEROBIC Blood Culture results may not be optimal due to an excessive volume of blood received in culture bottles   Culture   Final    NO GROWTH < 24 HOURS Performed at [facility], [address] [place], [place] [address]    Report Status PENDING  Incomplete  Blood Culture (routine x 2)     Status: None (Preliminary result)   Collection Time: 02/12/18  4:20 PM  Result Value Ref Range Status   Specimen Description BLOOD RIGHT FOREARM  Final   Special Requests   Final    BOTTLES DRAWN AEROBIC AND ANAEROBIC Blood Culture adequate volume   Culture   Final    NO GROWTH < 12 HOURS Performed at [facility], [address] [place], [place] [address]    Report Status PENDING  Incomplete    Patient came back to ER on 02/12/18 and subsequently [facility]  [phone] and was transferred to neuro-ICU and spoke with patient's RN [name] [name]. Informed RN of ED culture results being in  Epic from ER visit to [facility] on 02/08/18.    [name] [name] A 02/13/2018, 2:39 PM Clinical Pharmacist

## 2018-02-14 LAB — URINE CULTURE

## 2018-02-17 LAB — CULTURE, BLOOD (ROUTINE X 2)
CULTURE: NO GROWTH
CULTURE: NO GROWTH
SPECIAL REQUESTS: ADEQUATE

## 2018-02-23 ENCOUNTER — Ambulatory Visit: Payer: BLUE CROSS/BLUE SHIELD | Admitting: Pulmonary Disease

## 2018-03-18 ENCOUNTER — Emergency Department: Payer: PRIVATE HEALTH INSURANCE

## 2018-03-18 ENCOUNTER — Other Ambulatory Visit: Payer: Self-pay

## 2018-03-18 ENCOUNTER — Emergency Department
Admission: EM | Admit: 2018-03-18 | Discharge: 2018-03-18 | Disposition: A | Discharge status: Home / Self Care | Payer: PRIVATE HEALTH INSURANCE | Attending: Emergency Medicine | Admitting: Emergency Medicine

## 2018-03-18 ENCOUNTER — Emergency Department
Admit: 2018-03-18 | Discharge: 2018-03-18 | Disposition: A | Discharge status: Home / Self Care | Payer: PRIVATE HEALTH INSURANCE | Attending: Pulmonary Disease | Admitting: Pulmonary Disease

## 2018-03-18 ENCOUNTER — Encounter: Payer: Self-pay | Admitting: Emergency Medicine

## 2018-03-18 DIAGNOSIS — A0472 Enterocolitis due to Clostridium difficile, not specified as recurrent: Secondary | ICD-10-CM | POA: Insufficient documentation

## 2018-03-18 DIAGNOSIS — N390 Urinary tract infection, site not specified: Secondary | ICD-10-CM

## 2018-03-18 DIAGNOSIS — R509 Fever, unspecified: Secondary | ICD-10-CM

## 2018-03-18 DIAGNOSIS — Z7984 Long term (current) use of oral hypoglycemic drugs: Secondary | ICD-10-CM | POA: Diagnosis not present

## 2018-03-18 DIAGNOSIS — J690 Pneumonitis due to inhalation of food and vomit: Secondary | ICD-10-CM

## 2018-03-18 DIAGNOSIS — Z87891 Personal history of nicotine dependence: Secondary | ICD-10-CM | POA: Diagnosis not present

## 2018-03-18 DIAGNOSIS — Z79899 Other long term (current) drug therapy: Secondary | ICD-10-CM | POA: Diagnosis not present

## 2018-03-18 LAB — COMPREHENSIVE METABOLIC PANEL
ALBUMIN: 3.9 g/dL (ref 3.5–5.0)
ALK PHOS: 54 U/L (ref 38–126)
ALT: 22 U/L (ref 0–44)
ANION GAP: 7 (ref 5–15)
AST: 19 U/L (ref 15–41)
BUN: 9 mg/dL (ref 6–20)
CHLORIDE: 104 mmol/L (ref 98–111)
CO2: 30 mmol/L (ref 22–32)
Calcium: 9 mg/dL (ref 8.9–10.3)
Creatinine, Ser: 0.83 mg/dL (ref 0.61–1.24)
GFR calc Af Amer: >60 mL/min (ref >60)
GFR calc non Af Amer: >60 mL/min (ref >60)
GLUCOSE: 104 mg/dL — AB (ref 70–99)
POTASSIUM: 4.4 mmol/L (ref 3.5–5.1)
SODIUM: 141 mmol/L (ref 135–145)
Total Bilirubin: 0.7 mg/dL (ref 0.3–1.2)
Total Protein: 7.7 g/dL (ref 6.5–8.1)

## 2018-03-18 LAB — CBC WITH DIFFERENTIAL/PLATELET
Basophils Absolute: 0 10*3/uL (ref 0–0.1)
Basophils Relative: 0 %
Eosinophils Absolute: 0.1 10*3/uL (ref 0–0.7)
Eosinophils Relative: 1 %
HEMATOCRIT: 39 % — AB (ref 40.0–52.0)
Hemoglobin: 13 g/dL (ref 13.0–18.0)
LYMPHS ABS: 1.7 10*3/uL (ref 1.0–3.6)
LYMPHS PCT: 18 %
MCH: 27.7 pg (ref 26.0–34.0)
MCHC: 33.3 g/dL (ref 32.0–36.0)
MCV: 83.1 fL (ref 80.0–100.0)
MONO ABS: 0.7 10*3/uL (ref 0.2–1.0)
MONOS PCT: 7 %
NEUTROS ABS: 6.7 10*3/uL — AB (ref 1.4–6.5)
Neutrophils Relative %: 74 %
Platelets: 283 10*3/uL (ref 150–440)
RBC: 4.69 MIL/uL (ref 4.40–5.90)
RDW: 14.9 % — AB (ref 11.5–14.5)
WBC: 9.1 10*3/uL (ref 3.8–10.6)

## 2018-03-18 MED ORDER — SODIUM CHLORIDE 0.9 % IV SOLN
1.0000 g | Freq: Once | INTRAVENOUS | Status: AC
  Administered 2018-03-18: 1 g via INTRAVENOUS
  Filled 2018-03-18: qty 10

## 2018-03-18 MED ORDER — VANCOMYCIN HCL 125 MG PO CAPS: 125.0000 mg | ORAL_CAPSULE | Freq: Four times a day (QID) | ORAL | 0 refills | Status: AC

## 2018-03-18 MED ORDER — SULFAMETHOXAZOLE-TRIMETHOPRIM 800-160 MG PO TABS: 1.0000 | ORAL_TABLET | Freq: Two times a day (BID) | ORAL | 0 refills | Status: AC

## 2018-03-18 NOTE — ED Notes (Signed)
Patient's caregiver reports patient has had multiple hospital admissions at [facility] over the last month for recurrent UTIs, and sepsis. Patient treated with multiple IV antibiotics without resolution of UTI. Found to have ecoli in urine. Patient also tested positive for CDiff and hospitalized treated with IV vancomycin, liquid vancomycin and discharged with vancomycin tablets which he finished taking last Monday. Patient's caregiver states he had decrease in bowel movements immediately after completing round of vancomycin, however for the last several days had began to have multiple episodes of diarrhea a day. Patient also having intermittent fevers. Sent by PCP for follow-up and possible admission for IV antibiotics. Caregiver also states patient has had abnormal lab work and is anemic. Patient complaining of diarrhea and burning with urination.

## 2018-03-18 NOTE — ED Triage Notes (Signed)
Pt comes into the ED via POV c/o urinary tract infection and multiple hospitalizations for infections.  Patient [facility] released [facility] for c-diff, e-coli in his urine, and sepsis.  Patient has been antibiotic resistant.  Patient still having problems with straining with urination, fevers, and diarrhea. Patient's caregiver states that he has also had increased altered mental status from his baseline.

## 2018-03-18 NOTE — ED Provider Notes (Signed)
[facility] Emergency Department Provider Note  ____________________________________________  Time seen: Approximately 3:25 PM  I have reviewed the triage vital signs and the nursing notes.   HISTORY  Chief Complaint Urinary Tract Infection  Level 5 caveat:  Portions of the history and physical were unable to be obtained due to TBI   HPI [name] [name] is a 32 y.o. male with history of recurrent complicated UTIs, TBI, C. difficile infection currently on vancomycin, pulmonary embolism on Eliquis, hepatitis C, seizure disorder who presents for concerns of urinary tract infection.  Patient has been [facility] several times in the last 2 months, initially for urosepsis with cultures positive for E. coli multidrug-resistant.  Patient was treated with ertapenem.  After that patient developed C. difficile.  He was started on p.o. Vanco which she has been taking since being [facility].  He continues to have low-grade temp of 100F and continues to have several daily episodes of diarrhea.  Went to see his primary care doctor 2 days ago where labs and a catheterized urinalysis were done.  UA is positive for UTI and culture is already back showing Klebsiella which is susceptible to most antibiotics.  Patient's mother saw these results today on my chart and decided to bring him back to the emergency room to prevent him from becoming septic.  History is gathered from patient's caregiver who is at the bedside.  Patient is unable to contribute any history.   Past Medical History:  Diagnosis Date  . Anemia   . Anxiety   . C. difficile colitis   . Depression   . Drug abuse (HCC)   . Hepatitis C   . Hepatitis C   . Psychosis (HCC)   . Pulmonary emboli (HCC)   . Scoliosis   . Seizures (HCC)   . Subarachnoid hemorrhage (HCC)   . TBI (traumatic brain injury) (HCC) 07/2011    Patient Active Problem List   Diagnosis Date Noted  . Cellulitis 08/08/2016   . 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 Open wound of male external genitalia 05/02/2016  . Colitis, Clostridium difficile 06/19/2015  . Hypoxia 05/25/2015  . History of pulmonary embolism 05/25/2015  . OSA on CPAP 05/25/2015  . Pressure ulcer 05/25/2015  . Convulsions/seizures (HCC) 02/10/2015  . Aspiration pneumonia (HCC) 02/07/2015  . Encephalopathy, metabolic 02/07/2015  . Skin irritation 11/09/2014  . TBI (traumatic brain injury) (HCC) 02/04/2012  . 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 Heterotopic ossification 02/04/2012    Past Surgical History:  Procedure Laterality Date  . ANKLE SURGERY Bilateral   . BRAIN SURGERY    . COLONOSCOPY WITH PROPOFOL N/A 12/29/2015   Procedure: COLONOSCOPY WITH PROPOFOL;  Surgeon: Scot Junobert T Elliott, MD;  Location[facility] ENDOSCOPY;  Service: Endoscopy;  Laterality: N/A;  . FRACTURE SURGERY     left acetabulum  . ILEOSTOMY    . IVC FILTER PLACEMENT ([facility] HX)    . [place] gastrostomy  07/02/2014  . lung tube    . PEG TUBE PLACEMENT    . TRACHEOSTOMY      Prior to Admission medications   Medication Sig Start Date End Date Taking? Authorizing Provider  acetaminophen (TYLENOL) 325 MG tablet Take 2 tablets (650 mg total) by mouth every 6 (six) hours as needed for mild pain (or Fever >/= 101). 08/12/16   Gouru, Deanna ArtisAruna, MD  albuterol (PROVENTIL) (2.5 MG/3ML) 0.083% nebulizer solution USE  1 VIAL IN NEBULIZER 3 TIMES DAILY 12/30/17   Merwyn KatosSimonds, David B, MD  apixaban (ELIQUIS) 5 MG TABS tablet Take 5 mg by mouth 2 (two) times daily.     [provider]  baclofen (LIORESAL) 20 MG tablet Take 20 mg by mouth 3 (three) times daily.    [provider]  bisacodyl (DULCOLAX) 10 MG suppository Place 1 suppository (10 mg total) rectally daily as needed for moderate constipation. Patient taking differently: Place 10 mg rectally daily as needed for mild constipation or moderate  constipation.  08/12/16   Gouru, Deanna ArtisAruna, MD  cephALEXin (KEFLEX) 500 MG capsule Take 1 capsule (500 mg total) by mouth 3 (three) times daily. 02/08/18   Loleta RoseForbach, Cory, MD  doxepin (SINEQUAN) 10 MG capsule Take 30 mg by mouth at bedtime.    [provider]  esomeprazole (NEXIUM) 40 MG capsule Take 1 capsule by mouth 2 (two) times daily. 10/13/17   [provider]  gabapentin (NEURONTIN) 300 MG capsule Take 300 mg by mouth at bedtime.     [provider]  haloperidol (HALDOL) 5 MG tablet Take 5 mg by mouth as needed for agitation.    [provider]  indomethacin (INDOCIN SR) 75 MG CR capsule Take 75 mg by mouth daily with breakfast.    [provider]  lacosamide (VIMPAT) 200 MG TABS tablet Take 200 mg by mouth daily.     [provider]  lurasidone (LATUDA) 80 MG TABS tablet Take 120 mg by mouth daily. Patient takes 1/2 tablet (40mg ) at 8 am and 3 pm    [provider]  metFORMIN (GLUCOPHAGE) 500 MG tablet Take 1 tablet by mouth 3 (three) times daily.  10/29/17   [provider]  Multiple Vitamins-Minerals (MULTIVITAMIN WITH MINERALS) tablet Take 1 tablet by mouth daily.    [provider]  Nutritional Supplements (FEEDING SUPPLEMENT, JEVITY 1.5 CAL/FIBER,) LIQD Place 237 mLs into feeding tube daily. 08/13/16   Ramonita LabGouru, Aruna, MD  ondansetron (ZOFRAN ODT) 4 MG disintegrating tablet Allow 1-2 tablets to dissolve in your mouth every 8 hours as needed for nausea/vomiting 02/08/18   Loleta RoseForbach, Cory, MD  propranolol ER (INDERAL LA) 60 MG 24 hr capsule Take 1 capsule (60 mg total) by mouth daily. 06/25/15   Delfino LovettShah, Vipul, MD  sulfamethoxazole-trimethoprim (BACTRIM DS,SEPTRA DS) 800-160 MG tablet Take 1 tablet by mouth 2 (two) times daily for 10 days. 03/18/18 03/28/18  Nita SickleVeronese, [place], MD  tizanidine (ZANAFLEX) 2 MG capsule Take 1 capsule by mouth daily. 10/31/17   [provider]  vancomycin (VANCOCIN) 125 MG capsule Take 1 capsule  (125 mg total) by mouth 4 (four) times daily for 24 days. 03/18/18 04/11/18  Nita SickleVeronese, [place], MD  vitamin B-12 (CYANOCOBALAMIN) 1000 MCG tablet Take 1,000 mcg by mouth daily.    [provider]  Water For Irrigation, Sterile (FREE WATER) SOLN Place 250 mLs into feeding tube 5 (five) times daily. 08/12/16   Ramonita LabGouru, Aruna, MD    Allergies Ambien [zolpidem]; Ativan [lorazepam]; Depakote er [divalproex sodium er]; Dilaudid [hydromorphone]; and Keppra [levetiracetam]  Family History  Problem Relation Age of Onset  . Hypertension Unknown   . Hyperlipidemia Father   . Autoimmune disease Mother   . Cancer Maternal Grandmother   . Heart disease Maternal Grandfather     Social History Social History   Tobacco Use  . Smoking status: Former Smoker    Last attempt to quit: 08/07/2011    Years since quitting: 6.6  .  Smokeless tobacco: Former Neurosurgeon    Quit date: 07/30/2011  Substance Use Topics  . Alcohol use: No    Alcohol/week: 0.0 standard drinks  . Drug use: No    Review of Systems  Constitutional: + low grade fever. Respiratory: Negative for shortness of breath. Gastrointestinal: Negative for abdominal pain, vomiting. + diarrhea.  ____________________________________________   PHYSICAL EXAM:  VITAL SIGNS: ED Triage Vitals  Enc Vitals Group     BP 03/18/18 1226 115/78     Pulse Rate 03/18/18 1226 65     Resp 03/18/18 1226 18     Temp 03/18/18 1226 98.5 F (36.9 C)     Temp Source 03/18/18 1226 Oral     SpO2 03/18/18 1226 96 %     Weight 03/18/18 1227 235 lb 0.2 oz (106.6 kg)     Height --      Head Circumference --      Peak Flow --      Pain Score --      Pain Loc --      Pain Edu? --      Excl. in GC? --     Constitutional: Awake in no distress, at baseline per caregiver. HEENT:      Head: Normocephalic and atraumatic.         Eyes: Conjunctivae are normal. Sclera is non-icteric.       Mouth/Throat: Mucous membranes are moist.       Neck: Supple with no  signs of meningismus. Cardiovascular: Regular rate and rhythm. No murmurs, gallops, or rubs. 2+ symmetrical distal pulses are present in all extremities. No JVD. Respiratory: Normal respiratory effort. Lungs are clear to auscultation bilaterally. No wheezes, crackles, or rhonchi.  Gastrointestinal: Soft, non tender, and non distended with positive bowel sounds. No rebound or guarding. Peg tube in place. GU:  Condom catheter in place draining yellow urine. Musculoskeletal: Nontender with normal range of motion in all extremities. No edema, cyanosis, or erythema of extremities. Skin: Skin is warm, dry and intact. No rash noted. Neuro: at baseline ____________________________________________   LABS (all labs ordered are listed, but only abnormal results are displayed)  Labs Reviewed  COMPREHENSIVE METABOLIC PANEL - Abnormal; Notable for the following components:      Result Value   Glucose, Bld 104 (*)    All other components within normal limits  CBC WITH DIFFERENTIAL/PLATELET - Abnormal; Notable for the following components:   HCT 39.0 (*)    RDW 14.9 (*)    Neutro Abs 6.7 (*)    All other components within normal limits   ____________________________________________  EKG  none  ____________________________________________  RADIOLOGY  I have personally reviewed the images performed during this visit and I agree with the Radiologist's read.   Interpretation by Radiologist:  Dg Chest Port 1 View  Result Date: 03/18/2018 CLINICAL DATA:  32 year old male with fever. Recently treated for sepsis. Altered mental status. EXAM: PORTABLE CHEST 1 VIEW COMPARISON:  02/12/2018 portable chest and earlier. FINDINGS: Portable AP semi upright view at 1544 hours. Similar patient rotation to the right and low lung volumes. Allowing for portable technique the lungs are clear. Mediastinal contours remain normal. Visualized tracheal air column is within normal limits. Negative visible bowel gas  pattern. No acute osseous abnormality identified. IMPRESSION: Negative. Electronically Signed   By: Odessa Fleming M.D.   On: 03/18/2018 16:06      ____________________________________________   PROCEDURES  Procedure(s) performed: None Procedures Critical Care performed:  None ____________________________________________  INITIAL IMPRESSION / ASSESSMENT AND PLAN / ED COURSE   32 y.o. male with history of recurrent complicated UTIs, TBI, C. difficile infection currently on vancomycin, pulmonary embolism on Eliquis, hepatitis C, seizure disorder who presents for concerns of urinary tract infection.  Patient had a urinalysis and culture done 2 days ago at the PCPs office.  The sample was catheterize and is growing greater than 100,000 colonies of Klebsiella which is susceptible to most antibiotics.  At this time there are no signs of sepsis on exam.  Patient is afebrile, has normal vital signs, normal white count.  I will give him a dose of Rocephin and put patient on Bactrim.  According to patient's caregiver they spoke with Dr. Mechele Collin who is patient's GI doctor and he recommended the patient be admitted for a stool transplant since his infection of C. difficile was not clearing with antibiotics.  I will discuss with Dr. Jerre Simon to see what his recommendations are.   Clinical Course as of Mar 18 1645  Wed Mar 18, 2018  1637 I spoke with Dr. Mechele Collin, patient's GI doctor who recommended continuing patient on vancomycin orally up to 2 weeks after finishing antibiotics for the current urinary tract infection.  He advise to stop discharging patient on Bactrim instead of Augmentin as Bactrim has less activity against anaerobes which can help fight the C. difficile infection.  He recommended the patient goes back to [facility] if he becomes sicker during the course of this illness or if he remains like he is right now to follow-up with him in his office towards the end of the vancomycin treatment.  I discussed these  recommendations with the patient's caregiver who is at the bedside.   [CV]    Clinical Course User Index [CV] Don Perking [place], MD     As part of my medical decision making, I reviewed the following data within the electronic MEDICAL RECORD NUMBER History obtained from family, Nursing notes reviewed and incorporated, Labs reviewed , Old chart reviewed, A consult was requested and obtained from this/these consultant(s) GI, Notes from prior ED visits and [place] Controlled Substance Database    Pertinent labs & imaging results that were available during my care of the patient were reviewed by me and considered in my medical decision making (see chart for details).    ____________________________________________   FINAL CLINICAL IMPRESSION(S) / ED DIAGNOSES  Final diagnoses:  Complicated UTI (urinary tract infection)  C. difficile colitis      NEW MEDICATIONS STARTED DURING THIS VISIT:  ED Discharge Orders         Ordered    sulfamethoxazole-trimethoprim (BACTRIM DS,SEPTRA DS) 800-160 MG tablet  2 times daily     03/18/18 1640    vancomycin (VANCOCIN) 125 MG capsule  4 times daily     03/18/18 1640           Note:  This document was prepared using Dragon voice recognition software and may include unintentional dictation errors.    Don Perking, [place], MD 03/18/18 [phone]

## 2018-03-18 NOTE — Discharge Instructions (Addendum)
Take bactrim twice a day for 10 days. Per Dr. Mechele CollinElliott continue to take vancomycin for 2 weeks after finishing bactrim. If you are still having diarrhea or fevers, follow up with Dr. Mechele CollinElliott in his office. If you have worsening diarrhea, fever, abdominal pain, he recommended that [facility] as a fecal transplant cannot be done [facility] at [facility].

## 2018-03-19 ENCOUNTER — Encounter: Payer: Self-pay | Admitting: Pulmonary Disease

## 2018-03-19 ENCOUNTER — Ambulatory Visit (INDEPENDENT_AMBULATORY_CARE_PROVIDER_SITE_OTHER): Payer: PRIVATE HEALTH INSURANCE | Admitting: Pulmonary Disease

## 2018-03-19 VITALS — BP 108/72 | HR 73 | Ht 71.0 in

## 2018-03-19 DIAGNOSIS — J189 Pneumonia, unspecified organism: Secondary | ICD-10-CM | POA: Diagnosis not present

## 2018-03-19 DIAGNOSIS — G4731 Primary central sleep apnea: Secondary | ICD-10-CM | POA: Diagnosis not present

## 2018-03-19 DIAGNOSIS — A0472 Enterocolitis due to Clostridium difficile, not specified as recurrent: Secondary | ICD-10-CM

## 2018-03-19 DIAGNOSIS — G4734 Idiopathic sleep related nonobstructive alveolar hypoventilation: Secondary | ICD-10-CM | POA: Diagnosis not present

## 2018-03-19 DIAGNOSIS — Z87820 Personal history of traumatic brain injury: Secondary | ICD-10-CM | POA: Diagnosis not present
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
## 2018-03-19 NOTE — Patient Instructions (Addendum)
Continue regimen of nebulized albuterol, chest percussion vest and flutter valve.   May reduce duration of chest percussion to 15-minute sessions  Referral made to infectious disease.  Please obtain admission notes and discharge summaries from [facility] to be taken to that evaluation  Follow-up in this office in 3 to 4 months.  Call sooner as needed

## 2018-03-25 ENCOUNTER — Encounter: Payer: Self-pay | Admitting: Pulmonary Disease

## 2018-03-31 ENCOUNTER — Telehealth: Payer: Self-pay | Admitting: Pulmonary Disease

## 2018-03-31 DIAGNOSIS — A0472 Enterocolitis due to Clostridium difficile, not specified as recurrent: Secondary | ICD-10-CM

## 2018-03-31 NOTE — Telephone Encounter (Signed)
No, I can't schedule without referral and faxing clinical information to [facility]. Rhonda J Cobb

## 2018-03-31 NOTE — Telephone Encounter (Signed)
Spoke with Dr. Sung Amabile who stated that ID physician he wanted to refer patient to only sees inpatient.  Per Dr. Sung Amabile place a referral for pt to be seen at [facility] ID. Rhonda J Cobb

## 2018-03-31 NOTE — Telephone Encounter (Signed)
Has that been scheduled?

## 2018-03-31 NOTE — Telephone Encounter (Signed)
Patient's care taker Leeroy Bock calling asking about the referral we placed in for patient to see Infections disease doctor.. Would like a call back about this  Please advise

## 2018-04-01 NOTE — Telephone Encounter (Signed)
Referral placed for [facility] ID. Faxed information to [phone] to [facility] ID Dept. [facility] will contact patient's mother to schedule appointment with her. Nothing else needed at this time. Rhonda J Cobb

## 2018-04-07 ENCOUNTER — Ambulatory Visit (INDEPENDENT_AMBULATORY_CARE_PROVIDER_SITE_OTHER): Payer: PRIVATE HEALTH INSURANCE | Admitting: Internal Medicine

## 2018-04-07 ENCOUNTER — Encounter: Payer: Self-pay | Admitting: Internal Medicine

## 2018-04-07 VITALS — BP 111/76 | HR 76 | Temp 98.6°F

## 2018-04-07 DIAGNOSIS — N390 Urinary tract infection, site not specified: Secondary | ICD-10-CM | POA: Diagnosis not present

## 2018-04-07 DIAGNOSIS — N319 Neuromuscular dysfunction of bladder, unspecified: Secondary | ICD-10-CM

## 2018-04-07 DIAGNOSIS — A0471 Enterocolitis due to Clostridium difficile, recurrent: Secondary | ICD-10-CM
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
## 2018-04-08 ENCOUNTER — Other Ambulatory Visit
Admission: RE | Admit: 2018-04-08 | Discharge: 2018-04-08 | Disposition: A | Discharge status: Home / Self Care | Payer: PRIVATE HEALTH INSURANCE | Source: Ambulatory Visit | Attending: Internal Medicine | Admitting: Internal Medicine

## 2018-04-08 DIAGNOSIS — A0472 Enterocolitis due to Clostridium difficile, not specified as recurrent: Secondary | ICD-10-CM | POA: Diagnosis not present

## 2018-04-08 LAB — C DIFFICILE QUICK SCREEN W PCR REFLEX
C DIFFICILE (CDIFF) INTERP: NOT DETECTED
C Diff antigen: NEGATIVE
C Diff toxin: NEGATIVE

## 2018-04-09 ENCOUNTER — Ambulatory Visit: Payer: PRIVATE HEALTH INSURANCE | Admitting: Internal Medicine

## 2018-04-13 ENCOUNTER — Other Ambulatory Visit: Payer: Self-pay | Admitting: Internal Medicine

## 2018-04-13 MED ORDER — VANCOMYCIN HCL 125 MG PO CAPS: 125.0000 mg | ORAL_CAPSULE | Freq: Four times a day (QID) | ORAL | 1 refills | Status: DC

## 2018-04-13 NOTE — Progress Notes (Signed)
Patient's mother states that they had [name] [name]'s stool retested since having perfuse diarrhea- tests were positive  I have left voicemail on their machine and called in oral vancomycin

## 2018-04-20 ENCOUNTER — Telehealth: Payer: Self-pay | Admitting: Internal Medicine

## 2018-04-20 DIAGNOSIS — J189 Pneumonia, unspecified organism: Secondary | ICD-10-CM

## 2018-04-20 NOTE — Telephone Encounter (Signed)
Patient caretaker calling stating starting yesterday patient has have congestion going on She's states his PCP is out and was hoping we could give some advise  She states his temp is 99.6, which is starting of a fever  Would like to keep him out of hospital   Please call back with advise

## 2018-04-20 NOTE — Telephone Encounter (Signed)
Please advise on message below.

## 2018-04-21 MED ORDER — METRONIDAZOLE 500 MG PO TABS: 500.0000 mg | ORAL_TABLET | Freq: Two times a day (BID) | ORAL | 0 refills | Status: AC

## 2018-04-21 MED ORDER — LEVOFLOXACIN 500 MG PO TABS: 500.0000 mg | ORAL_TABLET | Freq: Every day | ORAL | 0 refills | Status: AC

## 2018-04-21 NOTE — Telephone Encounter (Signed)
Per DS, ordered Levaquin 500 mg x 5 days, and flagyl 500 mg bid x 5 days. [facility] to pharmacy [facility]

## 2018-04-27 ENCOUNTER — Ambulatory Visit (INDEPENDENT_AMBULATORY_CARE_PROVIDER_SITE_OTHER): Payer: PRIVATE HEALTH INSURANCE | Admitting: Internal Medicine

## 2018-04-27 DIAGNOSIS — Z23 Encounter for immunization: Secondary | ICD-10-CM

## 2018-04-27 DIAGNOSIS — R197 Diarrhea, unspecified: Secondary | ICD-10-CM

## 2018-04-27 MED ORDER — CHOLESTYRAMINE 4 G PO PACK: 4.0000 g | PACK | Freq: Three times a day (TID) | ORAL | 12 refills | Status: DC

## 2018-04-27 NOTE — Progress Notes (Signed)
RFV: recurrent cdifficile  Patient ID: [name] [name], male   DOB: 1986/03/03, 32 y.o.   MRN: [id number]  HPI [name] [name] is a 32yo M with TBI, history of recurrent cdifficile. Last week was given a 5 day course of FQ and metronidazole for URI But family did not take it. They did give him over the counter medications such as decongestants that helped his symptoms.  Mother had reports that he had worsening diarrhea thus empiric vancomycin started, though no stool specimen was tested, by misunderstanding.  [name] [name] still have 3-4 loose voluminous stools. No abdominal discomfort  Has not received further abtx since we last saw him for uri  Looking at past cdiff testing - testing has been negative  Outpatient Encounter Medications as of 04/27/2018  Medication Sig  . acetaminophen (TYLENOL) 325 MG tablet Take 2 tablets (650 mg total) by mouth every 6 (six) hours as needed for mild pain (or Fever >/= 101).  Marland Kitchen albuterol (PROVENTIL) (2.5 MG/3ML) 0.083% nebulizer solution USE 1 VIAL IN NEBULIZER 3 TIMES DAILY  . apixaban (ELIQUIS) 5 MG TABS tablet Take 5 mg by mouth 2 (two) times daily.   . baclofen (LIORESAL) 20 MG tablet Take 20 mg by mouth 3 (three) times daily.  . bisacodyl (DULCOLAX) 10 MG suppository Place 1 suppository (10 mg total) rectally daily as needed for moderate constipation. (Patient taking differently: Place 10 mg rectally daily as needed for mild constipation or moderate constipation. )  . doxepin (SINEQUAN) 10 MG capsule Take 30 mg by mouth at bedtime.  Marland Kitchen esomeprazole (NEXIUM) 40 MG capsule Take 1 capsule by mouth 2 (two) times daily.  . haloperidol (HALDOL) 5 MG tablet Take 5 mg by mouth as needed for agitation.  . indomethacin (INDOCIN SR) 75 MG CR capsule Take 75 mg by mouth daily with breakfast.  . lacosamide (VIMPAT) 200 MG TABS tablet Take 200 mg by mouth daily.   Marland Kitchen lurasidone (LATUDA) 80 MG TABS tablet Take 120 mg by mouth daily. Patient takes 1/2 tablet (40mg ) at 8 am and 3 pm  .  metFORMIN (GLUCOPHAGE) 500 MG tablet Take 1 tablet by mouth 3 (three) times daily.   . Multiple Vitamins-Minerals (MULTIVITAMIN WITH MINERALS) tablet Take 1 tablet by mouth daily.  . Nutritional Supplements (FEEDING SUPPLEMENT, JEVITY 1.5 CAL/FIBER,) LIQD Place 237 mLs into feeding tube daily.  . ondansetron (ZOFRAN ODT) 4 MG disintegrating tablet Allow 1-2 tablets to dissolve in your mouth every 8 hours as needed for nausea/vomiting  . propranolol ER (INDERAL LA) 60 MG 24 hr capsule Take 1 capsule (60 mg total) by mouth daily.  . tizanidine (ZANAFLEX) 2 MG capsule Take 1 capsule by mouth daily.  . vancomycin (VANCOCIN) 125 MG capsule Take 1 capsule (125 mg total) by mouth 4 (four) times daily.  . vitamin B-12 (CYANOCOBALAMIN) 1000 MCG tablet Take 1,000 mcg by mouth daily.  . Water For Irrigation, Sterile (FREE WATER) SOLN Place 250 mLs into feeding tube 5 (five) times daily.   No facility-administered encounter medications on file as of 04/27/2018.      Patient Active Problem List   Diagnosis Date Noted  . Cellulitis 08/08/2016  . 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 Open wound of male external genitalia 05/02/2016  . Colitis, Clostridium difficile 06/19/2015  . Hypoxia 05/25/2015  . History of pulmonary embolism 05/25/2015  . OSA on CPAP 05/25/2015  . Pressure ulcer 05/25/2015  . Convulsions/seizures (HCC) 02/10/2015  .  Aspiration pneumonia (HCC) 02/07/2015  . Encephalopathy, metabolic 02/07/2015  . Skin irritation 11/09/2014  . TBI (traumatic brain injury) (HCC) 02/04/2012  . 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 Heterotopic ossification 02/04/2012     Health Maintenance Due  Topic Date Due  . HIV Screening  02/02/2003  . TETANUS/TDAP  02/02/2007  . INFLUENZA VACCINE  02/26/2018     Review of Systems  Physical Exam  There were no vitals taken for this visit. Physical Exam  Constitutional: He is oriented  to person, place, and time. He appears well-developed and well-nourished. No distress.  HENT:  Mouth/Throat: Oropharynx is clear and moist. No oropharyngeal exudate.  Cardiovascular: Normal rate, regular rhythm and normal heart sounds. Exam reveals no gallop and no friction rub.  No murmur heard.  Pulmonary/Chest: Effort normal and breath sounds normal. No respiratory distress. He has no wheezes.  Abdominal: Soft. Bowel sounds are normal. He exhibits no distension. There is no tenderness.  Neurological: He is alert and oriented to person, place, but unable to move upper and lower extremities due to TBI   CBC Lab Results  Component Value Date   WBC 9.1 03/18/2018   RBC 4.69 03/18/2018   HGB 13.0 03/18/2018   HCT 39.0 (L) 03/18/2018   PLT 283 03/18/2018   MCV 83.1 03/18/2018   MCH 27.7 03/18/2018   MCHC 33.3 03/18/2018   RDW 14.9 (H) 03/18/2018   LYMPHSABS 1.7 03/18/2018   MONOABS 0.7 03/18/2018   EOSABS 0.1 03/18/2018    BMET Lab Results  Component Value Date   NA 141 03/18/2018   K 4.4 03/18/2018   CL 104 03/18/2018   CO2 30 03/18/2018   GLUCOSE 104 (H) 03/18/2018   BUN 9 03/18/2018   CREATININE 0.83 03/18/2018   CALCIUM 9.0 03/18/2018   GFRNONAA >60 03/18/2018   GFRAA >60 03/18/2018      Assessment and Plan  Diarrhea = not convinced this is cdifficile. Will stop taking vancomycin. He may have element of post infectious ibs. Will do a trial of immodium then questran if no improvement  Family to touch base next week to see if improving

## 2018-04-27 NOTE — Patient Instructions (Addendum)
  Please start by giving immodium once a day if not improved, can give twice a day (follow instructions on box)  If no improvement while on twice-a day immodium, can add questran  Can stop oral vancomycin - by taking 1 pill daily x 5 days, then stop  email me through my chart how zack is doing next week

## 2018-04-29 ENCOUNTER — Other Ambulatory Visit: Payer: Self-pay | Admitting: Behavioral Health

## 2018-04-29 ENCOUNTER — Other Ambulatory Visit: Payer: PRIVATE HEALTH INSURANCE

## 2018-04-29 DIAGNOSIS — A0471 Enterocolitis due to Clostridium difficile, recurrent: Secondary | ICD-10-CM

## 2018-04-29 NOTE — Progress Notes (Signed)
C.Diff+

## 2018-04-30 LAB — CLOSTRIDIUM DIFFICILE TOXIN B, QUALITATIVE, REAL-TIME PCR: CDIFFPCR: NOT DETECTED

## 2018-05-04 ENCOUNTER — Other Ambulatory Visit: Payer: Self-pay | Admitting: Internal Medicine

## 2018-05-04 DIAGNOSIS — N200 Calculus of kidney: Secondary | ICD-10-CM

## 2018-05-05 ENCOUNTER — Other Ambulatory Visit: Payer: Self-pay | Admitting: Internal Medicine

## 2018-05-05 ENCOUNTER — Ambulatory Visit
Admission: RE | Admit: 2018-05-05 | Discharge: 2018-05-05 | Disposition: A | Discharge status: Home / Self Care | Payer: PRIVATE HEALTH INSURANCE | Source: Ambulatory Visit | Attending: Internal Medicine | Admitting: Internal Medicine

## 2018-05-05 DIAGNOSIS — N200 Calculus of kidney: Secondary | ICD-10-CM

## 2018-05-05 DIAGNOSIS — R109 Unspecified abdominal pain: Secondary | ICD-10-CM | POA: Insufficient documentation

## 2018-06-19 ENCOUNTER — Encounter: Payer: Self-pay | Admitting: Pulmonary Disease

## 2018-06-19 ENCOUNTER — Ambulatory Visit (INDEPENDENT_AMBULATORY_CARE_PROVIDER_SITE_OTHER): Payer: PRIVATE HEALTH INSURANCE | Admitting: Pulmonary Disease

## 2018-06-19 VITALS — BP 140/78 | HR 80 | Ht 71.0 in

## 2018-06-19 DIAGNOSIS — G4731 Primary central sleep apnea: Secondary | ICD-10-CM | POA: Diagnosis not present

## 2018-06-19 DIAGNOSIS — G8250 Quadriplegia, unspecified: Secondary | ICD-10-CM | POA: Diagnosis not present

## 2018-06-19 DIAGNOSIS — G4734 Idiopathic sleep related nonobstructive alveolar hypoventilation: Secondary | ICD-10-CM | POA: Diagnosis not present

## 2018-06-19 DIAGNOSIS — J189 Pneumonia, unspecified organism: Secondary | ICD-10-CM

## 2018-06-19 DIAGNOSIS — Z87820 Personal history of traumatic brain injury: Secondary | ICD-10-CM

## 2018-06-19 NOTE — Patient Instructions (Signed)
Continue chest percussion vest twice a day Continue nocturnal BiPAP with oxygen bleed in Follow-up in 6 months.  Call sooner if needed
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
## 2018-06-30 ENCOUNTER — Other Ambulatory Visit: Payer: Self-pay | Admitting: Pulmonary Disease

## 2018-07-01 ENCOUNTER — Emergency Department: Payer: PRIVATE HEALTH INSURANCE

## 2018-07-01 ENCOUNTER — Encounter: Payer: Self-pay | Admitting: Emergency Medicine

## 2018-07-01 ENCOUNTER — Other Ambulatory Visit: Payer: Self-pay

## 2018-07-01 ENCOUNTER — Emergency Department
Admission: EM | Admit: 2018-07-01 | Discharge: 2018-07-01 | Disposition: A | Discharge status: Home / Self Care | Payer: PRIVATE HEALTH INSURANCE | Attending: Emergency Medicine | Admitting: Emergency Medicine

## 2018-07-01 DIAGNOSIS — Z7984 Long term (current) use of oral hypoglycemic drugs: Secondary | ICD-10-CM | POA: Insufficient documentation

## 2018-07-01 DIAGNOSIS — Z87891 Personal history of nicotine dependence: Secondary | ICD-10-CM | POA: Diagnosis not present

## 2018-07-01 DIAGNOSIS — Y999 Unspecified external cause status: Secondary | ICD-10-CM | POA: Insufficient documentation

## 2018-07-01 DIAGNOSIS — Z7901 Long term (current) use of anticoagulants: Secondary | ICD-10-CM | POA: Diagnosis not present

## 2018-07-01 DIAGNOSIS — S9001XA Contusion of right ankle, initial encounter: Secondary | ICD-10-CM | POA: Diagnosis not present

## 2018-07-01 DIAGNOSIS — Z79899 Other long term (current) drug therapy: Secondary | ICD-10-CM | POA: Insufficient documentation

## 2018-07-01 DIAGNOSIS — S8991XA Unspecified injury of right lower leg, initial encounter: Secondary | ICD-10-CM | POA: Diagnosis present

## 2018-07-01 DIAGNOSIS — Y939 Activity, unspecified: Secondary | ICD-10-CM | POA: Insufficient documentation

## 2018-07-01 DIAGNOSIS — S8011XA Contusion of right lower leg, initial encounter: Secondary | ICD-10-CM

## 2018-07-01 DIAGNOSIS — Z87820 Personal history of traumatic brain injury: Secondary | ICD-10-CM | POA: Diagnosis not present

## 2018-07-01 DIAGNOSIS — Y929 Unspecified place or not applicable: Secondary | ICD-10-CM | POA: Diagnosis not present

## 2018-07-01 DIAGNOSIS — S8001XA Contusion of right knee, initial encounter: Secondary | ICD-10-CM | POA: Diagnosis not present

## 2018-07-01 NOTE — Discharge Instructions (Addendum)
Follow-up with Dr. Rosita KeaMenz.  Please call for an appointment.  Apply ice to the right lower extremity to decrease the swelling.  Avoid any extended time in the standing frame.

## 2018-07-01 NOTE — ED Triage Notes (Signed)
[facility] to [facility] for RT leg [facility]. PT hx of TBI and is in motorized wheelchair. PT was in PT today when he ran into another chair and c/o RT leg pain. + swelling noted. Pt here with sister. Mother legal guardian and is on the way to hospital

## 2018-07-01 NOTE — ED Notes (Signed)
Pt's mother legal guardian at bedside and signed discharge instructions and no questions at this time

## 2018-07-01 NOTE — ED Notes (Signed)
Pt has right leg pain after incident during PT while in wheelchair/automated chair. Pt has swelling and bruising noted to area.
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
## 2018-07-06 ENCOUNTER — Ambulatory Visit: Payer: PRIVATE HEALTH INSURANCE | Admitting: Internal Medicine

## 2018-08-11 ENCOUNTER — Telehealth: Payer: Self-pay | Admitting: Pulmonary Disease

## 2018-08-11 NOTE — Telephone Encounter (Signed)
Called and spoke to pt's caregiver, Chelsea(DPR). Leeroy Bock is requesting a larger percussion vest for pt, as his current vest is too small and ripping.    Lm with Marquita with [employer] at [phone] to see what the process is for pt to obtain larger vest.  Leeroy Bock is aware that we will contact her with a response once speaking with [employer].

## 2018-08-13 NOTE — Telephone Encounter (Signed)
Spoke with pt's mother and sister and advised them of the new plan for a trainer to be sent out to the house to refit patient for a new vest. Mother and sister aware. Nothing else needed at this time for this issue.   While on this conversation, pt's mother stated that a sleep study appointment was arranged for patient back in the summer for his to have a sleep study on the [facility] at [facility].  Appointment was confirmed and once they got patient there and unloaded was advised that they didn't have anyone there that worked with the [employer] and that the study could not be done.  Mother is requesting another referral to evaluate this issue. Please advise. Rhonda J Cobb

## 2018-08-13 NOTE — Telephone Encounter (Signed)
Left message per previous message with appt details for 09/30/18 with Dr. Jeannine Boga. Family should receive a call and a letter detailing appt details. Nothing further needed.

## 2018-08-13 NOTE — Telephone Encounter (Signed)
Left detailed message on pts mother's cell. Per Dr. Sung AmabileSimonds he [facility] not [facility] and is unsure who did. On 12/12/17 pt [facility] and there was mention of cpap titration and referring pt to Dr. Jeannine BogaPervez with sleep group. Contact # for them [phone]. Dr. Sung AmabileSimonds recommends she contact them to have this rescheduled.  [facility] called [facility] and [facility]: transferred to nurse triage line. Left message for  Dr. Scharlene GlossHall's triage nurse.

## 2018-08-13 NOTE — Telephone Encounter (Signed)
Sarah with [employer] returned my call and stated that she received message about the above patient and the vest being too small.  Per Maralyn Sago she will have a couple of bigger sizes of the vest dropped shipped to patient immediately.  I will contact caregiver and let her know as well. Rhonda J Cobb

## 2018-08-13 NOTE — Telephone Encounter (Addendum)
[facility] for Dr. Jeannine BogaPervez. She did schedule appt for 09/30/2018. Apparently titration study referral had been closed. They are going to try to work patient in maybe sooner if possible wit Dr. Jeannine BogaPervez.   Appt 09/30/18 11:20 arrival @ 11 am  [address]

## 2018-08-13 NOTE — Telephone Encounter (Signed)
Spoke with Maralyn Sago at [employer] who stated that she would request a trainer to go out and access patient for either another [employer] vest or access for a wrap.  Since patient is unable to weigh, we don't know what size to ship.  LMOVM for Caregiver to return my call to advise. Rhonda J Cobb

## 2018-09-29 ENCOUNTER — Telehealth: Payer: Self-pay | Admitting: Pulmonary Disease

## 2018-09-29 NOTE — Telephone Encounter (Signed)
I spoke with Maralyn Sago at [employer], she stated that the patient was fit for a new vest and she was under the impression that it had been shipped to him. She is going to confirm with her company then give [place] a call back.   Also spoke with [facility], she is aware and I will call her as soon as we have a response from [employer].

## 2018-10-01 NOTE — Telephone Encounter (Signed)
LM for Maralyn Sago to call us back and give [place] an update as to where we stand with the vest.

## 2018-10-01 NOTE — Telephone Encounter (Signed)
Received phone call from Sarah at [employer] that the vest has been overnighted to [place] and it should arrive today. They are to notify us if they have any further questions.

## 2018-11-27 ENCOUNTER — Other Ambulatory Visit: Payer: Self-pay | Admitting: Pulmonary Disease

## 2019-03-06 ENCOUNTER — Telehealth: Payer: Self-pay | Admitting: Critical Care Medicine

## 2019-03-06 NOTE — Telephone Encounter (Signed)
COVID positive. SpO2 92% at home, down from his baseline of 98%. Mom has just gotten off the phone with his PCP, who is suggesting to go to the ED. I agree.  Julian Hy, DO 03/06/19 8:54 AM [facility]

## 2019-03-08 NOTE — Telephone Encounter (Signed)
DS please advise. Thanks.  

## 2019-03-16 NOTE — Telephone Encounter (Signed)
DR please advise. Thanks 

## 2019-04-08 ENCOUNTER — Telehealth: Payer: Self-pay | Admitting: Pulmonary Disease

## 2019-04-09 DIAGNOSIS — A0472 Enterocolitis due to Clostridium difficile, not specified as recurrent: Secondary | ICD-10-CM

## 2019-04-09 MED ORDER — VANCOMYCIN HCL 125 MG PO CAPS: 125.0000 mg | ORAL_CAPSULE | Freq: Four times a day (QID) | ORAL | 0 refills | Status: AC

## 2019-04-09 NOTE — Telephone Encounter (Signed)
DS please advise. Thanks.  

## 2019-09-01 ENCOUNTER — Telehealth: Payer: Self-pay

## 2019-09-01 NOTE — Telephone Encounter (Signed)
Received a request from [facility] for external records. I faxed request to [employer].

## 2020-01-06 ENCOUNTER — Encounter: Payer: Self-pay | Admitting: *Deleted

## 2020-01-07 ENCOUNTER — Inpatient Hospital Stay: Payer: PRIVATE HEALTH INSURANCE

## 2020-01-07 ENCOUNTER — Inpatient Hospital Stay: Payer: PRIVATE HEALTH INSURANCE | Attending: Internal Medicine | Admitting: Internal Medicine

## 2020-01-07 ENCOUNTER — Encounter: Payer: Self-pay | Admitting: Internal Medicine

## 2020-01-07 ENCOUNTER — Other Ambulatory Visit: Payer: Self-pay

## 2020-01-07 DIAGNOSIS — Z87891 Personal history of nicotine dependence: Secondary | ICD-10-CM

## 2020-01-07 DIAGNOSIS — Z7901 Long term (current) use of anticoagulants: Secondary | ICD-10-CM | POA: Insufficient documentation

## 2020-01-07 DIAGNOSIS — E611 Iron deficiency: Secondary | ICD-10-CM | POA: Insufficient documentation

## 2020-01-07 DIAGNOSIS — Z79899 Other long term (current) drug therapy: Secondary | ICD-10-CM | POA: Diagnosis not present

## 2020-01-07 DIAGNOSIS — Z87820 Personal history of traumatic brain injury: Secondary | ICD-10-CM

## 2020-01-07 DIAGNOSIS — Z8669 Personal history of other diseases of the nervous system and sense organs: Secondary | ICD-10-CM

## 2020-01-07 DIAGNOSIS — D509 Iron deficiency anemia, unspecified: Secondary | ICD-10-CM | POA: Insufficient documentation

## 2020-01-07 DIAGNOSIS — B1920 Unspecified viral hepatitis C without hepatic coma: Secondary | ICD-10-CM

## 2020-01-07 DIAGNOSIS — Z993 Dependence on wheelchair: Secondary | ICD-10-CM | POA: Diagnosis not present

## 2020-01-07 NOTE — Assessment & Plan Note (Addendum)
#   Hemoglobin 11-12; MCV- 77s.May 2021- ferritin/iron sat- 4.  Patient currently on p.o. iron; hemoglobin improved from 9.5-11 recently.  I would recommend continued p.o. iron at this time-given improvement on p.o. iron/and also difficulty with IV access [see below]  #Etiology-of iron deficient is unclear-s/p EGD/colonoscopy-no source of bleeding noted.  Question capsule study.   #History of traumatic brain injury/wheelchair-bound/history of seizures/frequent UTIs s/p Foley.  Thank you Dr.MIller for allowing me to participate in the care of your pleasant patient. Please do not hesitate to contact me with questions or concerns in the interim.  # DISPOSITION: # labs-cbc/LDH- 1 month # follow up in 3 months- MD; labs- cbc/bmp; Iron studies ferritin; possible Venofer-Dr.B

## 2020-01-07 NOTE — Assessment & Plan Note (Deleted)
#   Hemoglobin 11-12; MCV- 77s.May 2021- ferritin/iron sat- 4.  # PEG  # IVC filter  # Hx of sieizres-  # DISPOSITION:

## 2020-01-07 NOTE — Progress Notes (Signed)
[place] CONSULT NOTE  Patient Care Team: Rusty Aus, MD as PCP - General (Internal Medicine) Bary Castilla Forest Gleason, MD (General Surgery)  CHIEF COMPLAINTS/PURPOSE OF CONSULTATION: iron deficiency anemia  HEMATOLOGY HISTORY:   #May 2021-iron deficiency anemia [hemoglobin 9-11; ferritin 4/saturation 4]  # History of traumatic brain injury/wheelchair-bound/history of seizures/frequent UTIs s/p Foley.  HISTORY OF PRESENTING ILLNESS: Patient unable to provide any history given his neurologic deficits [name] [name] 34 y.o.  male with above history of multiple medical problems has been referred to [place] for further evaluation/work-up for anemia.  Patient was recently admitted to [facility]-noted to be iron deficient with a hemoglobin of 9.5.  EGD/colonoscopy unremarkable as per family.  Patient received IV in iron infusion.  Patient also started on p.o. iron.  As per the family/mother-patient has not had any blood in stools or any obvious blood in urine.  Change in bowel habits- None Blood in urine: None Difficulty swallowing: None; PEG tube as needed Abnormal weight loss: None Iron supplementation: Yes Prior Blood transfusions: No   Review of Systems  Unable to perform ROS: Medical condition    MEDICAL HISTORY:  Past Medical History:  Diagnosis Date  . Anemia   . Anxiety   . C. difficile colitis   . Depression   . Drug abuse ([place])   . Hepatitis C   . Hepatitis C   . Psychosis ([place])   . Pulmonary emboli ([place])   . Scoliosis   . Seizures ([place])   . Subarachnoid hemorrhage ([place])   . TBI (traumatic brain injury) ([place]) 07/2011    SURGICAL HISTORY: Past Surgical History:  Procedure Laterality Date  . ANKLE SURGERY Bilateral   . BRAIN SURGERY    . COLONOSCOPY WITH PROPOFOL N/A 12/29/2015   Procedure: COLONOSCOPY WITH PROPOFOL;  Surgeon: Manya Silvas, MD;  Location: [facility] ENDOSCOPY;  Service: Endoscopy;  Laterality: N/A;  . FRACTURE SURGERY     left acetabulum  .  ILEOSTOMY    . IVC FILTER PLACEMENT ([facility] HX)    . Janeway gastrostomy  07/02/2014  . lung tube    . PEG TUBE PLACEMENT    . TRACHEOSTOMY      SOCIAL HISTORY: Social History   Socioeconomic History  . Marital status: Single    Spouse name: Not on file  . Number of children: Not on file  . Years of education: Not on file  . Highest education level: Not on file  Occupational History  . Not on file  Tobacco Use  . Smoking status: Former Smoker    Quit date: 08/07/2011    Years since quitting: 8.4  . Smokeless tobacco: Former Systems developer    Quit date: 07/30/2011  Vaping Use  . Vaping Use: Never used  Substance and Sexual Activity  . Alcohol use: No    Alcohol/week: 0.0 standard drinks  . Drug use: No  . Sexual activity: Not on file  Other Topics Concern  . Not on file  Social History Narrative  . Not on file   Social Determinants of Health   Financial Resource Strain:   . Difficulty of Paying Living Expenses:   Food Insecurity:   . Worried About Charity fundraiser in the Last Year:   . Arboriculturist in the Last Year:   Transportation Needs:   . Film/video editor (Medical):   Marland Kitchen Lack of Transportation (Non-Medical):   Physical Activity:   . Days of Exercise per Week:   .  Minutes of Exercise per Session:   Stress:   . Feeling of Stress :   Social Connections:   . Frequency of Communication with Friends and Family:   . Frequency of Social Gatherings with Friends and Family:   . Attends Religious Services:   . Active Member of Clubs or Organizations:   . Attends Banker Meetings:   Marland Kitchen Marital Status:   Intimate Partner Violence:   . Fear of Current or Ex-Partner:   . Emotionally Abused:   Marland Kitchen Physically Abused:   . Sexually Abused:     FAMILY HISTORY: Family History  Problem Relation Age of Onset  . Hypertension Other   . Hyperlipidemia Father   . Autoimmune disease Mother   . Cancer Maternal Grandmother   . Heart disease Maternal Grandfather      ALLERGIES:  is allergic to [place] [zolpidem], ativan [lorazepam], carbamazepine, depakote er [divalproex sodium er], dilaudid [hydromorphone], and keppra [levetiracetam].  MEDICATIONS:  Current Outpatient Medications  Medication Sig Dispense Refill  . acetaminophen (TYLENOL) 325 MG tablet Take 2 tablets (650 mg total) by mouth every 6 (six) hours as needed for mild pain (or Fever >/= 101).    Marland Kitchen albuterol (PROVENTIL) (2.5 MG/3ML) 0.083% nebulizer solution USE 1 VIAL IN NEBULIZER 3 TIMES DAILY 300 mL 5  . ALPRAZolam (XANAX) 1 MG tablet Take 1 tablet by mouth at bedtime as needed.    Marland Kitchen amoxicillin-clavulanate (AUGMENTIN) 875-125 MG tablet Take 1 tablet by mouth in the morning and at bedtime.    Marland Kitchen apixaban (ELIQUIS) 5 MG TABS tablet Take 5 mg by mouth 2 (two) times daily.     . baclofen (LIORESAL) 20 MG tablet Take 20 mg by mouth 3 (three) times daily.    . bisacodyl (DULCOLAX) 10 MG suppository Place 1 suppository (10 mg total) rectally daily as needed for moderate constipation. (Patient taking differently: Place 10 mg rectally daily as needed for mild constipation or moderate constipation. ) 12 suppository 0  . doxepin (SINEQUAN) 10 MG capsule Take 30 mg by mouth at bedtime.    Marland Kitchen esomeprazole (NEXIUM) 40 MG capsule Take 1 capsule by mouth 2 (two) times daily.  0  . haloperidol (HALDOL) 5 MG tablet Take 5 mg by mouth as needed for agitation.    . indomethacin (INDOCIN SR) 75 MG CR capsule Take 75 mg by mouth daily with breakfast.    . lacosamide (VIMPAT) 200 MG TABS tablet Take 200 mg by mouth daily.     Marland Kitchen lurasidone (LATUDA) 80 MG TABS tablet Take 120 mg by mouth daily. Patient takes 1/2 tablet (40mg ) at 8 am and 3 pm    . metFORMIN (GLUCOPHAGE) 500 MG tablet Take 1 tablet by mouth 3 (three) times daily.   4  . Multiple Vitamins-Minerals (MULTIVITAMIN WITH MINERALS) tablet Take 1 tablet by mouth daily.    . Nutritional Supplements (FEEDING SUPPLEMENT, JEVITY 1.5 CAL/FIBER,) LIQD Place 237 mLs  into feeding tube daily. 1500 mL 2  . ondansetron (ZOFRAN ODT) 4 MG disintegrating tablet Allow 1-2 tablets to dissolve in your mouth every 8 hours as needed for nausea/vomiting 30 tablet 0  . propranolol ER (INDERAL LA) 60 MG 24 hr capsule Take 1 capsule (60 mg total) by mouth daily. 30 capsule 0  . tizanidine (ZANAFLEX) 2 MG capsule Take 1 capsule by mouth daily.  4  . vitamin B-12 (CYANOCOBALAMIN) 1000 MCG tablet Take 1,000 mcg by mouth daily.    . Water For Irrigation, Sterile (FREE WATER)  SOLN Place 250 mLs into feeding tube 5 (five) times daily. 1000 mL 0   No current facility-administered medications for this visit.      PHYSICAL EXAMINATION:   Vitals:   01/07/20 1117  BP: 107/75  Pulse: 71  Temp: 98.8 F (37.1 C)   Filed Weights   01/07/20 1117  Weight: 233 lb (105.7 kg)    Physical Exam Constitutional:      Comments: Caucasian male patient; quadriplegic wheelchair-bound.  Accompanied by mother/caregiver.  HENT:     Head: Normocephalic and atraumatic.     Mouth/Throat:     Pharynx: No oropharyngeal exudate.  Eyes:     Pupils: Pupils are equal, round, and reactive to light.  Cardiovascular:     Rate and Rhythm: Normal rate and regular rhythm.  Pulmonary:     Effort: Pulmonary effort is normal. No respiratory distress.     Breath sounds: Normal breath sounds. No wheezing.  Abdominal:     General: Bowel sounds are normal. There is no distension.     Palpations: Abdomen is soft. There is no mass.     Tenderness: There is no abdominal tenderness. There is no guarding or rebound.  Musculoskeletal:        General: No tenderness. Normal range of motion.     Cervical back: Normal range of motion and neck supple.  Skin:    General: Skin is warm.  Neurological:     Mental Status: He is alert.     Comments: Chronic upper lower extremity weakness.  Patient minimally verbal.  Unable to pursue conversation  Psychiatric:        Mood and Affect: Affect normal.      LABORATORY DATA:  I have reviewed the data as listed Lab Results  Component Value Date   WBC 9.1 03/18/2018   HGB 13.0 03/18/2018   HCT 39.0 (L) 03/18/2018   MCV 83.1 03/18/2018   PLT 283 03/18/2018   No results for input(s): NA, K, CL, CO2, GLUCOSE, BUN, CREATININE, CALCIUM, GFRNONAA, GFRAA, PROT, ALBUMIN, AST, ALT, ALKPHOS, BILITOT, BILIDIR, IBILI in the last 8760 hours.   No results found.  Iron deficiency # Hemoglobin 11-12; MCV- 77s.May 2021- ferritin/iron sat- 4.  Patient currently on p.o. iron; hemoglobin improved from 9.5-11 recently.  I would recommend continued p.o. iron at this time-given improvement on p.o. iron/and also difficulty with IV access [see below]  #Etiology-of iron deficient is unclear-s/p EGD/colonoscopy-no source of bleeding noted.  Question capsule study.   #History of traumatic brain injury/wheelchair-bound/history of seizures/frequent UTIs s/p Foley.  Thank you Dr.MIller for allowing me to participate in the care of your pleasant patient. Please do not hesitate to contact me with questions or concerns in the interim.  # DISPOSITION: # labs-cbc/LDH- 1 month # follow up in 3 months- MD; labs- cbc/bmp; Iron studies ferritin; possible Venofer-Dr.B    All questions were answered. The patient knows to call the clinic with any problems, questions or concerns.      Earna Coder, MD 01/09/2020 6:43 PM

## 2020-01-25 ENCOUNTER — Telehealth: Payer: Self-pay | Admitting: *Deleted

## 2020-01-25 DIAGNOSIS — D509 Iron deficiency anemia, unspecified: Secondary | ICD-10-CM

## 2020-01-25 NOTE — Telephone Encounter (Signed)
Order placed and call returned to patient mother.

## 2020-01-25 NOTE — Telephone Encounter (Signed)
Please order B12 levels.  Thanks GB

## 2020-01-25 NOTE — Telephone Encounter (Signed)
Patient mother/guardian called requesting that his B12 level and Iron levels be checked when he comes in Friday as "both of them are low" I do see that the Iron studies are ordered , but not a B 12 level. She requests a return call to be sure that these are orderd [phone].   Contains abnormal dataVitamin B12 Specimen:  Blood  Ref Range & Units 2 mo ago  Vitamin B12 >300 pg/mL 173Low      Resulting Agency  [facility] - LAB  Narrative Performed by [facility] - LAB <200 pg/mL:  Low, consistent with Vitamin B12 Deficiency  200-300 pg/mL: Borderline, possible Vitamin B12 Deficiency  >300 pg/mL:  Normal. Vitamin B12 Deficiency is unlikely Specimen Collected: 11/24/19 11:06 AM Last Resulted: 11/24/19 2:16 PM  Received From: [facility]  Result Received: 12/30/19 8:29 AM

## 2020-01-28 ENCOUNTER — Other Ambulatory Visit: Payer: Self-pay

## 2020-01-28 ENCOUNTER — Inpatient Hospital Stay: Payer: PRIVATE HEALTH INSURANCE | Attending: Internal Medicine

## 2020-01-28 DIAGNOSIS — E611 Iron deficiency: Secondary | ICD-10-CM

## 2020-01-28 DIAGNOSIS — D509 Iron deficiency anemia, unspecified: Secondary | ICD-10-CM | POA: Diagnosis present

## 2020-01-28 LAB — BASIC METABOLIC PANEL
Anion gap: 12 (ref 5–15)
BUN: 9 mg/dL (ref 6–20)
CO2: 24 mmol/L (ref 22–32)
Calcium: 9.3 mg/dL (ref 8.9–10.3)
Chloride: 99 mmol/L (ref 98–111)
Creatinine, Ser: 0.77 mg/dL (ref 0.61–1.24)
GFR calc Af Amer: >60 mL/min (ref >60)
GFR calc non Af Amer: >60 mL/min (ref >60)
Glucose, Bld: 108 mg/dL — ABNORMAL HIGH (ref 70–99)
Potassium: 4.2 mmol/L (ref 3.5–5.1)
Sodium: 135 mmol/L (ref 135–145)

## 2020-01-28 LAB — IRON AND TIBC
Iron: 47 ug/dL (ref 45–182)
Saturation Ratios: 16 % — ABNORMAL LOW (ref 17.9–39.5)
TIBC: 297 ug/dL (ref 250–450)
UIBC: 250 ug/dL

## 2020-01-28 LAB — CBC WITH DIFFERENTIAL/PLATELET
Abs Immature Granulocytes: 0.04 10*3/uL (ref 0.00–0.07)
Basophils Absolute: 0.1 10*3/uL (ref 0.0–0.1)
Basophils Relative: 1 %
Eosinophils Absolute: 0 10*3/uL (ref 0.0–0.5)
Eosinophils Relative: 0 %
HCT: 38.8 % — ABNORMAL LOW (ref 39.0–52.0)
Hemoglobin: 12.7 g/dL — ABNORMAL LOW (ref 13.0–17.0)
Immature Granulocytes: 1 %
Lymphocytes Relative: 27 %
Lymphs Abs: 1.7 10*3/uL (ref 0.7–4.0)
MCH: 25.6 pg — ABNORMAL LOW (ref 26.0–34.0)
MCHC: 32.7 g/dL (ref 30.0–36.0)
MCV: 78.2 fL — ABNORMAL LOW (ref 80.0–100.0)
Monocytes Absolute: 0.6 10*3/uL (ref 0.1–1.0)
Monocytes Relative: 9 %
Neutro Abs: 4 10*3/uL (ref 1.7–7.7)
Neutrophils Relative %: 62 %
Platelets: 285 10*3/uL (ref 150–400)
RBC: 4.96 MIL/uL (ref 4.22–5.81)
RDW: 22 % — ABNORMAL HIGH (ref 11.5–15.5)
WBC: 6.4 10*3/uL (ref 4.0–10.5)
nRBC: 0 % (ref 0.0–0.2)

## 2020-01-28 LAB — LACTATE DEHYDROGENASE: LDH: 111 U/L (ref 98–192)

## 2020-01-28 LAB — VITAMIN B12: Vitamin B-12: 233 pg/mL (ref 180–914)

## 2020-01-28 LAB — FERRITIN: Ferritin: 74 ng/mL (ref 24–336)

## 2020-02-01 ENCOUNTER — Telehealth: Payer: Self-pay | Admitting: Internal Medicine

## 2020-02-01 NOTE — Telephone Encounter (Signed)
On 07/06-spoke to patient's mother regarding iron studies B12-on B12 injection.  Hemoglobin improving around 12.  Continue p.o. iron.  Follow-up as planned. Mother in agreement.  Gb

## 2020-02-04 ENCOUNTER — Other Ambulatory Visit: Payer: PRIVATE HEALTH INSURANCE

## 2020-04-06 ENCOUNTER — Encounter: Payer: Self-pay | Admitting: Internal Medicine

## 2020-04-07 ENCOUNTER — Inpatient Hospital Stay: Payer: PRIVATE HEALTH INSURANCE

## 2020-04-07 ENCOUNTER — Other Ambulatory Visit: Payer: Self-pay

## 2020-04-07 ENCOUNTER — Inpatient Hospital Stay: Payer: PRIVATE HEALTH INSURANCE | Attending: Internal Medicine

## 2020-04-07 ENCOUNTER — Inpatient Hospital Stay (HOSPITAL_BASED_OUTPATIENT_CLINIC_OR_DEPARTMENT_OTHER): Payer: PRIVATE HEALTH INSURANCE | Admitting: Internal Medicine

## 2020-04-07 VITALS — BP 116/85 | HR 66 | Temp 97.9°F | Resp 16

## 2020-04-07 DIAGNOSIS — D509 Iron deficiency anemia, unspecified: Secondary | ICD-10-CM | POA: Diagnosis not present

## 2020-04-07 DIAGNOSIS — Z79899 Other long term (current) drug therapy: Secondary | ICD-10-CM | POA: Diagnosis not present

## 2020-04-07 DIAGNOSIS — E611 Iron deficiency: Secondary | ICD-10-CM

## 2020-04-07 DIAGNOSIS — Z87891 Personal history of nicotine dependence: Secondary | ICD-10-CM | POA: Insufficient documentation

## 2020-04-07 DIAGNOSIS — Z7901 Long term (current) use of anticoagulants: Secondary | ICD-10-CM | POA: Insufficient documentation

## 2020-04-07 DIAGNOSIS — Z7984 Long term (current) use of oral hypoglycemic drugs: Secondary | ICD-10-CM | POA: Insufficient documentation

## 2020-04-07 DIAGNOSIS — F329 Major depressive disorder, single episode, unspecified: Secondary | ICD-10-CM | POA: Diagnosis not present

## 2020-04-07 DIAGNOSIS — Z87820 Personal history of traumatic brain injury: Secondary | ICD-10-CM | POA: Diagnosis not present

## 2020-04-07 DIAGNOSIS — Z86711 Personal history of pulmonary embolism: Secondary | ICD-10-CM | POA: Insufficient documentation

## 2020-04-07 NOTE — Assessment & Plan Note (Addendum)
#   Hemoglobin 11-12; MCV- 77s.May 2021- ferritin/iron sat- 4.  Patient currently on p.o. iron; hemoglobin improved from 9.5-11.  Unable to draw labs because of poor IV access-see below.  Continue p.o. iron for now.  #IV access issues-discussed pros and cons of having a port for IV access.  Mother is interested however wants to talk to her family.  #History of traumatic brain injury/wheelchair-bound/history of seizures/frequent UTIs s/p Foley.  Stable.  No new hospitalizations.  # DISPOSITION: # no venofer # follow up TBD--Dr.B

## 2020-04-11 NOTE — Progress Notes (Signed)
[name] CONSULT NOTE  Patient Care Team: [name], MD as PCP - General (Internal Medicine) Lemar Livings, Merrily Pew, MD (General Surgery)  CHIEF COMPLAINTS/PURPOSE OF CONSULTATION: iron deficiency anemia  HEMATOLOGY HISTORY:   #May 2021-iron deficiency anemia [hemoglobin 9-11; ferritin 4/saturation 4]; EGD/colonoscopy unremarkable as per family.  s/p IV in iron infusion. on p.o. iron.  # History of traumatic brain injury/wheelchair-bound/history of seizures/frequent UTIs s/p Foley.  #Poor IV access  HISTORY OF PRESENTING ILLNESS: Patient unable to provide any history given his neurologic deficits [name] [name] 34 y.o.  male with above history of multiple medical problems; iron deficiency anemia of unclear  Is here for follow-up.  Patient interim has not been admitted to hospital.  At this patient appetite is good.  No weight loss.  No blood in stools or black or stools.   Review of Systems  Unable to perform ROS: Medical condition    MEDICAL HISTORY:  Past Medical History:  Diagnosis Date  . Anemia   . Anxiety   . C. difficile colitis   . Depression   . Drug abuse (HCC)   . Hepatitis C   . Hepatitis C   . Psychosis (HCC)   . Pulmonary emboli (HCC)   . Scoliosis   . Seizures (HCC)   . Subarachnoid hemorrhage (HCC)   . TBI (traumatic brain injury) (HCC) 07/2011    SURGICAL HISTORY: Past Surgical History:  Procedure Laterality Date  . ANKLE SURGERY Bilateral   . BRAIN SURGERY    . COLONOSCOPY WITH PROPOFOL N/A 12/29/2015   Procedure: COLONOSCOPY WITH PROPOFOL;  Surgeon: Scot Jun, MD;  Location: [facility] ENDOSCOPY;  Service: Endoscopy;  Laterality: N/A;  . FRACTURE SURGERY     left acetabulum  . ILEOSTOMY    . IVC FILTER PLACEMENT ([facility] HX)    . [place] gastrostomy  07/02/2014  . lung tube    . PEG TUBE PLACEMENT    . TRACHEOSTOMY      SOCIAL HISTORY: Social History   Socioeconomic History  . Marital status: Single    Spouse name: Not  on file  . Number of children: Not on file  . Years of education: Not on file  . Highest education level: Not on file  Occupational History  . Not on file  Tobacco Use  . Smoking status: Former Smoker    Quit date: 08/07/2011    Years since quitting: 8.6  . Smokeless tobacco: Former Neurosurgeon    Quit date: 07/30/2011  Vaping Use  . Vaping Use: Never used  Substance and Sexual Activity  . Alcohol use: No    Alcohol/week: 0.0 standard drinks  . Drug use: No  . Sexual activity: Not on file  Other Topics Concern  . Not on file  Social History Narrative  . Not on file   Social Determinants of Health   Financial Resource Strain:   . Difficulty of Paying Living Expenses: Not on file  Food Insecurity:   . Worried About Programme researcher, broadcasting/film/video in the Last Year: Not on file  . Ran Out of Food in the Last Year: Not on file  Transportation Needs:   . Lack of Transportation (Medical): Not on file  . Lack of Transportation (Non-Medical): Not on file  Physical Activity:   . Days of Exercise per Week: Not on file  . Minutes of Exercise per Session: Not on file  Stress:   . Feeling of Stress : Not on file  Social Connections:   .  Frequency of Communication with Friends and Family: Not on file  . Frequency of Social Gatherings with Friends and Family: Not on file  . Attends Religious Services: Not on file  . Active Member of Clubs or Organizations: Not on file  . Attends Banker Meetings: Not on file  . Marital Status: Not on file  Intimate Partner Violence:   . Fear of Current or Ex-Partner: Not on file  . Emotionally Abused: Not on file  . Physically Abused: Not on file  . Sexually Abused: Not on file    FAMILY HISTORY: Family History  Problem Relation Age of Onset  . Hypertension Other   . Hyperlipidemia Father   . Autoimmune disease Mother   . Cancer Maternal Grandmother   . Heart disease Maternal Grandfather     ALLERGIES:  is allergic to [place] [zolpidem], ativan  [lorazepam], carbamazepine, depakote er [divalproex sodium er], dilaudid [hydromorphone], and keppra [levetiracetam].  MEDICATIONS:  Current Outpatient Medications  Medication Sig Dispense Refill  . acetaminophen (TYLENOL) 325 MG tablet Take 2 tablets (650 mg total) by mouth every 6 (six) hours as needed for mild pain (or Fever >/= 101).    Marland Kitchen albuterol (PROVENTIL) (2.5 MG/3ML) 0.083% nebulizer solution USE 1 VIAL IN NEBULIZER 3 TIMES DAILY 300 mL 5  . ALPRAZolam (XANAX) 1 MG tablet Take 1 tablet by mouth at bedtime as needed.    Marland Kitchen apixaban (ELIQUIS) 5 MG TABS tablet Take 5 mg by mouth 2 (two) times daily.     . baclofen (LIORESAL) 20 MG tablet Take 20 mg by mouth 3 (three) times daily.    . bisacodyl (DULCOLAX) 10 MG suppository Place 1 suppository (10 mg total) rectally daily as needed for moderate constipation. (Patient taking differently: Place 10 mg rectally daily as needed for mild constipation or moderate constipation. ) 12 suppository 0  . doxepin (SINEQUAN) 10 MG capsule Take 30 mg by mouth at bedtime.    Marland Kitchen esomeprazole (NEXIUM) 40 MG capsule Take 1 capsule by mouth 2 (two) times daily.  0  . haloperidol (HALDOL) 5 MG tablet Take 5 mg by mouth as needed for agitation.    . indomethacin (INDOCIN SR) 75 MG CR capsule Take 75 mg by mouth daily with breakfast.    . lacosamide (VIMPAT) 200 MG TABS tablet Take 200 mg by mouth daily.     Marland Kitchen lurasidone (LATUDA) 80 MG TABS tablet Take 120 mg by mouth daily. Patient takes 1/2 tablet (40mg ) at 8 am and 3 pm    . metFORMIN (GLUCOPHAGE) 500 MG tablet Take 1 tablet by mouth 3 (three) times daily.   4  . Multiple Vitamins-Minerals (MULTIVITAMIN WITH MINERALS) tablet Take 1 tablet by mouth daily.    . Nutritional Supplements (FEEDING SUPPLEMENT, JEVITY 1.5 CAL/FIBER,) LIQD Place 237 mLs into feeding tube daily. 1500 mL 2  . ondansetron (ZOFRAN ODT) 4 MG disintegrating tablet Allow 1-2 tablets to dissolve in your mouth every 8 hours as needed for  nausea/vomiting 30 tablet 0  . propranolol ER (INDERAL LA) 60 MG 24 hr capsule Take 1 capsule (60 mg total) by mouth daily. 30 capsule 0  . tizanidine (ZANAFLEX) 2 MG capsule Take 1 capsule by mouth daily.  4  . vitamin B-12 (CYANOCOBALAMIN) 1000 MCG tablet Take 1,000 mcg by mouth daily.    . Water For Irrigation, Sterile (FREE WATER) SOLN Place 250 mLs into feeding tube 5 (five) times daily. 1000 mL 0   No current facility-administered medications for this  visit.      PHYSICAL EXAMINATION:   Vitals:   04/07/20 1401  BP: 116/85  Pulse: 66  Resp: 16  Temp: 97.9 F (36.6 C)  SpO2: 100%   There were no vitals filed for this visit.  Physical Exam Constitutional:      Comments: Caucasian male patient; quadriplegic wheelchair-bound.  Accompanied by mother/caregiver.  HENT:     Head: Normocephalic and atraumatic.     Mouth/Throat:     Pharynx: No oropharyngeal exudate.  Eyes:     Pupils: Pupils are equal, round, and reactive to light.  Cardiovascular:     Rate and Rhythm: Normal rate and regular rhythm.  Pulmonary:     Effort: Pulmonary effort is normal. No respiratory distress.     Breath sounds: Normal breath sounds. No wheezing.  Abdominal:     General: Bowel sounds are normal. There is no distension.     Palpations: Abdomen is soft. There is no mass.     Tenderness: There is no abdominal tenderness. There is no guarding or rebound.  Musculoskeletal:        General: No tenderness. Normal range of motion.     Cervical back: Normal range of motion and neck supple.  Skin:    General: Skin is warm.  Neurological:     Mental Status: He is alert.     Comments: Chronic upper lower extremity weakness.  Patient minimally verbal.  Unable to pursue conversation  Psychiatric:        Mood and Affect: Affect normal.     LABORATORY DATA:  I have reviewed the data as listed Lab Results  Component Value Date   WBC 6.4 01/28/2020   HGB 12.7 (L) 01/28/2020   HCT 38.8 (L)  01/28/2020   MCV 78.2 (L) 01/28/2020   PLT 285 01/28/2020   Recent Labs    01/28/20 1137  NA 135  K 4.2  CL 99  CO2 24  GLUCOSE 108*  BUN 9  CREATININE 0.77  CALCIUM 9.3  GFRNONAA >60  GFRAA >60     No results found.  Iron deficiency # Hemoglobin 11-12; MCV- 77s.May 2021- ferritin/iron sat- 4.  Patient currently on p.o. iron; hemoglobin improved from 9.5-11.  Unable to draw labs because of poor IV access-see below.  Continue p.o. iron for now.  #IV access issues-discussed pros and cons of having a port for IV access.  Mother is interested however wants to talk to her family.  #History of traumatic brain injury/wheelchair-bound/history of seizures/frequent UTIs s/p Foley.  Stable.  No new hospitalizations.  # DISPOSITION: # no venofer # follow up TBD--Dr.B    All questions were answered. The patient knows to call the clinic with any problems, questions or concerns.      Earna Coder, MD 04/11/2020 2:34 PM

## 2021-08-06 ENCOUNTER — Telehealth: Payer: Self-pay | Admitting: Internal Medicine

## 2021-08-06 NOTE — Telephone Encounter (Signed)
Spoke to patient's mother, [name](DPR) . Edmonia Lynch stated that she would able to locate name of DME company and this situation has been resolved.  Nothing further needed at this time.

## 2024-04-02 ENCOUNTER — Encounter: Payer: Self-pay | Admitting: Internal Medicine

## 2024-04-09 ENCOUNTER — Encounter: Payer: Self-pay | Admitting: *Deleted

## 2024-04-10 NOTE — Progress Notes (Signed)
 [facility] [address] [place] OUTPATIENT SPEECH PATHOLOGY 04/10/2024   Patient Name: [name] [name] Date of Birth:10/16/1985 Session Number: 32 Diagnosis:  Encounter Diagnoses  Name Primary?  . Cognitive communication deficit Yes  . Dysarthria   . Partial symptomatic epilepsy with complex partial seizures, intractable, without status epilepticus    (CMS-HCC)   . Cognitive impairment   . H/O traumatic brain injury   . Traumatic brain injury with loss of consciousness, sequela   . Oral phase dysphagia        Date of Evaluation: 06/04/22 Date of Symptom Onset: 06/10/19 Referred by: Arrie Wanda Rouse, MD      Dates of Certification: UMR; Medicaid Deport 99 Authorized  Chief Complaint: dysarthria s/p TBI  Note Type: Progress Note  ASSESSMENT:    Next Visit Plan: Continue per POC. Imitate sentences, picture retention  OBJECTIVE:  The patient was seen for outpatient speech therapy to address the goals outlined below. Pt independently recalled x2 clear speech strategies and ST reviewed the 3rd which is over articulation (pt endorsed that he was not good at that one). Therefore, ST practice this with confrontational naming task (90% acc given minimal cues) and generating sentences (86% acc given min to moderate cues). ST then targeted intelligibility with closed ended (100% acc ind) and open ended questions (77% intelligibility). With emphasis on FCD which is negatively impacting articulation, pt was prompted to read a series of words with increasing length, pt achieved 100% accuracy given minimal cues. Lastly, ST targeted memory and with simple (Level 2) paragraph retention task, pt achieved 87% accuracy given moderate cues. For recalling the score of his favorite team's game, pt recalled on x2/3 trials. ST to continue to target. Noted overall improvements in alertness, cooperation and intelligibility this session.  Stimulability: Pt was somewhat  stimulable Treatment Recommendations: Continue Treatment  PLAN:  SLP Follow-up / Frequency: 2x per month for Planned Treatment Duration : 12 weeks   Planned Interventions: Compensatory Strategy Training, Diaphragmatic breathing/respiratory retraining, Oral Motor Exercises, Patient education, Skilled Memory Training, Intelligibility Strategy Training OP ST, Compensatory strategy training, diaphragmatic breathing techniques  Prognosis:  Fair   Negative Prognosis Rationale: Cognitive deficits, Time post onset, Severity of deficits     Positive Prognosis Rationale: Age, Good caregiver/family support, Motivation, Previous tx with benefit    Goals:      STG 1: Pt will demonstrate adequate coordination of breathing and speech in 4/5 trials with minimal cues given use of EMST.   STG 2: Pt will complete hierarchical speech production tasks to address increase intelligibility to at least 90% given minimal cues with unfamiliar listeners. (Goal Updated)   STG 3: Pt will complete simple, functional recall tasks with 90% accuracy given minimal cues. (Goal Updated)   STG 4: Pt will sustain attention to therapy tasks for up to 3 minutes with no more than 5 cues for redirection/perseveration.                   Time Frame: 5  Duration: months  LTG #1: Pt will improve speech intelligibility and functional cognition to promote improved communication and interaction with family/friends as evidenced by objective data and/or pt/family report.                               Time Frame: 5  Duration: months     SUBJECTIVE:  Pt was prompt in arrival, accompanied by weekend caregiver Harlene.  Pt was alert and cooperative this session which is an improvement compared to previous sessions. Pt CG reported that they have been working on reducing his medications. Pain?: No     Precautions: Falls, Aspiration, Seizure   Education Provided: Family, Other caregiver, Importance of Therapy, Multi-modality  communication, Strategies to maximize functional communication   Response to Education: Understanding verbalized   Communication/Consultation: n/a  Session Duration : 52    Today's Charges (noted here with $$): SLP Treatment Charges $$ 92507-Treatment S/L/V - individual [mins]: 42            I attest that I have reviewed the above information. Signed: Krystal M Speights, SLP 04/10/2024 12:49 PM

## 2024-04-12 ENCOUNTER — Inpatient Hospital Stay: Attending: Internal Medicine | Admitting: Internal Medicine

## 2024-04-12 ENCOUNTER — Encounter: Payer: Self-pay | Admitting: Internal Medicine

## 2024-04-12 ENCOUNTER — Inpatient Hospital Stay

## 2024-04-12 VITALS — BP 97/74 | HR 57 | Temp 97.6°F | Resp 12

## 2024-04-12 DIAGNOSIS — D509 Iron deficiency anemia, unspecified: Secondary | ICD-10-CM | POA: Insufficient documentation

## 2024-04-12 DIAGNOSIS — E611 Iron deficiency: Secondary | ICD-10-CM

## 2024-04-12 NOTE — Progress Notes (Signed)
 [name] CONSULT NOTE  Patient Care Team: [name], MD as PCP - General (Internal Medicine) [name], [name], MD (General Surgery) [name], MD as Consulting Physician (Oncology)  CHIEF COMPLAINTS/PURPOSE OF CONSULTATION: iron deficiency anemia  HEMATOLOGY HISTORY:   #May 2021-iron deficiency anemia [hemoglobin 9-11; ferritin 4/saturation 4]; EGD/colonoscopy unremarkable as per family.  s/p IV in iron infusion. on p.o. iron.  # History of traumatic brain injury/wheelchair-bound/history of seizures/frequent UTIs s/p Foley. S/p PEG   #Poor IV access  HISTORY OF PRESENTING ILLNESS: Patient unable to provide any history given his neurologic deficits. Accompanied by mother- main historian.   [name] [name] 38 y.o.  male with above history of multiple medical problems; iron deficiency anemia of unclear etiology.   Patient interim has not been admitted to hospital.  As per Patient' s mother- has intermittent blood stools. No Hx of hemorrhoids  At this patient appetite is good.  No weight loss.  Continues to be on oral iron.   Review of Systems  Unable to perform ROS: Medical condition    MEDICAL HISTORY:  Past Medical History:  Diagnosis Date   Anemia    Anxiety    C. difficile colitis    Depression    Drug abuse (HCC)    Hepatitis C    Hepatitis C    Psychosis (HCC)    Pulmonary emboli (HCC)    Scoliosis    Seizures (HCC)    Subarachnoid hemorrhage (HCC)    TBI (traumatic brain injury) (HCC) 07/2011    SURGICAL HISTORY: Past Surgical History:  Procedure Laterality Date   ANKLE SURGERY Bilateral    BRAIN SURGERY     COLONOSCOPY WITH PROPOFOL  N/A 12/29/2015   Procedure: COLONOSCOPY WITH PROPOFOL ;  Surgeon: [name], MD;  Location: [facility] ENDOSCOPY;  Service: Endoscopy;  Laterality: N/A;   FRACTURE SURGERY     left acetabulum   ILEOSTOMY     IVC FILTER PLACEMENT ([facility] HX)     [place] gastrostomy  07/02/2014   lung tube     PEG  TUBE PLACEMENT     TRACHEOSTOMY      SOCIAL HISTORY: Social History   Socioeconomic History   Marital status: Single    Spouse name: Not on file   Number of children: Not on file   Years of education: Not on file   Highest education level: Not on file  Occupational History   Not on file  Tobacco Use   Smoking status: Former    Current packs/day: 0.00    Types: Cigarettes    Quit date: 08/07/2011    Years since quitting: 12.6   Smokeless tobacco: Former    Quit date: 07/30/2011  Vaping Use   Vaping status: Never Used  Substance and Sexual Activity   Alcohol use: No    Alcohol/week: 0.0 standard drinks of alcohol   Drug use: No   Sexual activity: Not on file  Other Topics Concern   Not on file  Social History Narrative   Not on file   Social Drivers of Health   Financial Resource Strain: Not on file  Food Insecurity: No Food Insecurity (04/12/2024)   Hunger Vital Sign    Worried About Running Out of Food in the Last Year: Never true    Ran Out of Food in the Last Year: Never true  Transportation Needs: No Transportation Needs (04/12/2024)   PRAPARE - Administrator, Civil Service (Medical): No  Lack of Transportation (Non-Medical): No  Physical Activity: Not on file  Stress: Not on file  Social Connections: Not on file  Intimate Partner Violence: Not At Risk (04/12/2024)   Humiliation, Afraid, Rape, and Kick questionnaire    Fear of Current or Ex-Partner: No    Emotionally Abused: No    Physically Abused: No    Sexually Abused: No    FAMILY HISTORY: Family History  Problem Relation Age of Onset   Autoimmune disease Mother    Hyperlipidemia Father    Breast cancer Maternal Grandmother    Heart disease Maternal Grandfather    Hypertension Other     ALLERGIES:  is allergic to ambien [zolpidem], ativan [lorazepam], carbamazepine, depakote er [divalproex sodium er], dilaudid  [hydromorphone ], and keppra [levetiracetam].  MEDICATIONS:  Current  Outpatient Medications  Medication Sig Dispense Refill   acetaminophen  (TYLENOL ) 325 MG tablet Take 2 tablets (650 mg total) by mouth every 6 (six) hours as needed for mild pain (or Fever >/= 101).     albuterol  (PROVENTIL ) (2.5 MG/3ML) 0.083% nebulizer solution USE 1 VIAL IN NEBULIZER 3 TIMES DAILY 300 mL 5   ALPRAZolam (XANAX) 1 MG tablet Take 1 tablet by mouth at bedtime as needed.     apixaban  (ELIQUIS ) 5 MG TABS tablet Take 5 mg by mouth 2 (two) times daily.      cannabidiol (EPIDIOLEX) 100 MG/ML solution Take 2.5 mg/kg by mouth 2 (two) times daily.     cefixime (SUPRAX) 400 MG CAPS capsule Take 400 mg by mouth daily.     Cenobamate (XCOPRI) 100 MG TABS Take 1 tablet by mouth at bedtime.     Cenobamate (XCOPRI) 14 x 12.5 MG & 14 x 25 MG TBPK Take 150 mg by mouth at bedtime.     cetirizine (ZYRTEC) 10 MG chewable tablet Chew 10 mg by mouth daily.     cetirizine (ZYRTEC) 10 MG tablet Take 10 mg by mouth daily.     chlorhexidine  (PERIDEX ) 0.12 % solution Use as directed 15 mLs in the mouth or throat 2 (two) times daily.     doxepin  (SINEQUAN ) 10 MG capsule Take 30 mg by mouth at bedtime.     esomeprazole (NEXIUM) 40 MG capsule Take 1 capsule by mouth 2 (two) times daily.  0   ferrous gluconate (FERGON) 324 MG tablet Take 324 mg by mouth daily with breakfast.     furosemide  (LASIX ) 20 MG tablet Take 20 mg by mouth daily as needed.     haloperidol  (HALDOL ) 5 MG tablet Take 5 mg by mouth as needed for agitation. (Patient taking differently: Take 10 mg by mouth 3 (three) times daily.)     lamoTRIgine  (LAMICTAL ) 200 MG tablet Take 200 mg by mouth 2 (two) times daily.     lamoTRIgine  (LAMICTAL ) 25 MG tablet Take 25 mg by mouth 2 (two) times daily.     lurasidone  (LATUDA ) 80 MG TABS tablet Take 120 mg by mouth daily. Patient takes 1/2 tablet (40mg ) at 8 am and 3 pm     metFORMIN (GLUCOPHAGE) 500 MG tablet Take 1 tablet by mouth 3 (three) times daily.   4   Multiple Vitamins-Minerals (MULTIVITAMIN  WITH MINERALS) tablet Take 1 tablet by mouth daily.     nitrofurantoin, macrocrystal-monohydrate, (MACROBID) 100 MG capsule Take 100 mg by mouth at bedtime.     Nutritional Supplements (FEEDING SUPPLEMENT, JEVITY 1.5 CAL/FIBER,) LIQD Place 237 mLs into feeding tube daily. 1500 mL 2   ondansetron  (ZOFRAN  ODT) 4 MG  disintegrating tablet Allow 1-2 tablets to dissolve in your mouth every 8 hours as needed for nausea/vomiting 30 tablet 0   propranolol  (INNOPRAN  XL) 120 MG 24 hr capsule Take 120 mg by mouth at bedtime.     tizanidine  (ZANAFLEX ) 2 MG capsule Take 1 capsule by mouth daily.  4   vitamin B-12 (CYANOCOBALAMIN) 1000 MCG tablet Take 1,000 mcg by mouth daily.     Water  For Irrigation, Sterile (FREE WATER ) SOLN Place 250 mLs into feeding tube 5 (five) times daily. 1000 mL 0   No current facility-administered medications for this visit.      PHYSICAL EXAMINATION:   Vitals:   04/12/24 1407  BP: 97/74  Pulse: (!) 57  Resp: 12  Temp: 97.6 F (36.4 C)  SpO2: 97%   Filed Weights    Physical Exam Constitutional:      Comments: Caucasian male patient; quadriplegic wheelchair-bound.  Accompanied by mother/caregiver.  HENT:     Head: Normocephalic and atraumatic.     Mouth/Throat:     Pharynx: No oropharyngeal exudate.  Eyes:     Pupils: Pupils are equal, round, and reactive to light.  Cardiovascular:     Rate and Rhythm: Normal rate and regular rhythm.  Pulmonary:     Effort: Pulmonary effort is normal. No respiratory distress.     Breath sounds: Normal breath sounds. No wheezing.  Abdominal:     General: Bowel sounds are normal. There is no distension.     Palpations: Abdomen is soft. There is no mass.     Tenderness: There is no abdominal tenderness. There is no guarding or rebound.  Musculoskeletal:        General: No tenderness. Normal range of motion.     Cervical back: Normal range of motion and neck supple.  Skin:    General: Skin is warm.  Neurological:      Mental Status: He is alert.     Comments: Chronic upper lower extremity weakness.  Patient minimally verbal.  Unable to pursue conversation  Psychiatric:        Mood and Affect: Affect normal.     LABORATORY DATA:  I have reviewed the data as listed Lab Results  Component Value Date   WBC 6.4 01/28/2020   HGB 12.7 (L) 01/28/2020   HCT 38.8 (L) 01/28/2020   MCV 78.2 (L) 01/28/2020   PLT 285 01/28/2020   No results for input(s): NA, K, CL, CO2, GLUCOSE, BUN, CREATININE, CALCIUM, GFRNONAA, GFRAA, PROT, ALBUMIN, AST, ALT, ALKPHOS, BILITOT, BILIDIR, IBILI in the last 8760 hours.    No results found.  Iron deficiency # Hx of iron deficiency anemia - May 2021- ferritin/iron sat- 4.  Patient currently on p.o. iron; however- hemoglobin SEP around 12.8 [PCP]-patient is currently on p.o. iron. Prior hx of colonoscopy- [facility].   # As per mother patient is tired/more fatigued than usual.  Recommend proceeding with iron infusions.  Discussed the risk of infusion reactions.  #History of traumatic brain injury/wheelchair-bound/history of seizures/frequent UTIs s/p Foley.  Stable.  No new hospitalizations.  # DISPOSITION: # No labs today #  venofer weekly every other week-  x3 [total]- start next week.  # follow up  in 3 months- APP- labs- cbc/cmp; iron studies; ferritin. B12- possible venofer- Dr.B    All questions were answered. The patient knows to call the clinic with any problems, questions or concerns.      Cindy JONELLE Joe, MD 04/12/2024 4:09 PM

## 2024-04-12 NOTE — Assessment & Plan Note (Addendum)
#   Hx of iron deficiency anemia - May 2021- ferritin/iron sat- 4.  Patient currently on p.o. iron; however- hemoglobin SEP around 12.8 [PCP]-patient is currently on p.o. iron. Prior hx of colonoscopy- [facility].   # As per mother patient is tired/more fatigued than usual.  Recommend proceeding with iron infusions.  Discussed the risk of infusion reactions.  #History of traumatic brain injury/wheelchair-bound/history of seizures/frequent UTIs s/p Foley.  Stable.  No new hospitalizations.  # DISPOSITION: # No labs today #  venofer weekly every other week-  x3 [total]- start next week.  # follow up  in 3 months- APP- labs- cbc/cmp; iron studies; ferritin. B12- possible venofer- Dr.B

## 2024-04-12 NOTE — Progress Notes (Signed)
No questions at this time

## 2024-04-20 ENCOUNTER — Inpatient Hospital Stay

## 2024-04-21 ENCOUNTER — Inpatient Hospital Stay

## 2024-04-21 VITALS — BP 125/78 | HR 62 | Temp 98.2°F | Resp 18

## 2024-04-21 DIAGNOSIS — E611 Iron deficiency: Secondary | ICD-10-CM

## 2024-04-21 DIAGNOSIS — D509 Iron deficiency anemia, unspecified: Secondary | ICD-10-CM | POA: Diagnosis not present

## 2024-04-21 MED ORDER — IRON SUCROSE 20 MG/ML IV SOLN
200.0000 mg | Freq: Once | INTRAVENOUS | Status: AC
  Administered 2024-04-21: 200 mg via INTRAVENOUS
  Filled 2024-04-21: qty 10

## 2024-04-21 NOTE — Patient Instructions (Signed)

## 2024-04-22 ENCOUNTER — Encounter: Payer: Self-pay | Admitting: Internal Medicine

## 2024-05-05 ENCOUNTER — Inpatient Hospital Stay: Attending: Internal Medicine

## 2024-05-05 VITALS — BP 102/67 | HR 76 | Temp 98.3°F | Resp 14

## 2024-05-05 DIAGNOSIS — E611 Iron deficiency: Secondary | ICD-10-CM

## 2024-05-05 DIAGNOSIS — D509 Iron deficiency anemia, unspecified: Secondary | ICD-10-CM | POA: Insufficient documentation

## 2024-05-05 MED ORDER — IRON SUCROSE 20 MG/ML IV SOLN
200.0000 mg | Freq: Once | INTRAVENOUS | Status: AC
  Administered 2024-05-05: 200 mg via INTRAVENOUS
  Filled 2024-05-05: qty 10

## 2024-05-05 NOTE — Patient Instructions (Signed)

## 2024-05-19 ENCOUNTER — Inpatient Hospital Stay

## 2024-05-19 VITALS — BP 105/72 | HR 72 | Temp 97.2°F | Resp 16

## 2024-05-19 DIAGNOSIS — E611 Iron deficiency: Secondary | ICD-10-CM

## 2024-05-19 DIAGNOSIS — D509 Iron deficiency anemia, unspecified: Secondary | ICD-10-CM | POA: Diagnosis not present

## 2024-05-19 MED ORDER — IRON SUCROSE 20 MG/ML IV SOLN
200.0000 mg | Freq: Once | INTRAVENOUS | Status: AC
  Administered 2024-05-19: 200 mg via INTRAVENOUS
  Filled 2024-05-19: qty 10

## 2024-05-19 NOTE — Patient Instructions (Signed)

## 2024-05-28 ENCOUNTER — Encounter: Payer: Self-pay | Admitting: Internal Medicine

## 2024-06-15 ENCOUNTER — Encounter: Payer: Self-pay | Admitting: Internal Medicine

## 2024-07-05 ENCOUNTER — Inpatient Hospital Stay

## 2024-07-05 ENCOUNTER — Telehealth: Payer: Self-pay | Admitting: Internal Medicine

## 2024-07-05 ENCOUNTER — Inpatient Hospital Stay: Admitting: Nurse Practitioner

## 2024-07-05 NOTE — Telephone Encounter (Signed)
 due to weather, pt mother called to cancel appts and will call back to r/s. Appts canceled and noted.

## 2024-08-04 ENCOUNTER — Telehealth: Payer: Self-pay | Admitting: Internal Medicine

## 2024-08-04 NOTE — Telephone Encounter (Signed)
 Pt family member came in to clinic to r/s appts that were canceled in Dec. (Lab/NP/iron ) pt family member only wanted to r/s the lab not the other appts. She stated to just do the labs and get the results and then schedule accordingly.  Lab r/s to 1/9 and time confirmed with pt family member.

## 2024-08-06 ENCOUNTER — Inpatient Hospital Stay: Attending: Internal Medicine

## 2024-08-06 DIAGNOSIS — E611 Iron deficiency: Secondary | ICD-10-CM

## 2024-08-06 LAB — CBC WITH DIFFERENTIAL (CANCER CENTER ONLY)
Abs Immature Granulocytes: 0.06 K/uL (ref 0.00–0.07)
Basophils Absolute: 0.1 K/uL (ref 0.0–0.1)
Basophils Relative: 1 %
Eosinophils Absolute: 0 K/uL (ref 0.0–0.5)
Eosinophils Relative: 0 %
HCT: 40.4 % (ref 39.0–52.0)
Hemoglobin: 13.2 g/dL (ref 13.0–17.0)
Immature Granulocytes: 1 %
Lymphocytes Relative: 18 %
Lymphs Abs: 1.8 K/uL (ref 0.7–4.0)
MCH: 28.3 pg (ref 26.0–34.0)
MCHC: 32.7 g/dL (ref 30.0–36.0)
MCV: 86.5 fL (ref 80.0–100.0)
Monocytes Absolute: 0.6 K/uL (ref 0.1–1.0)
Monocytes Relative: 6 %
Neutro Abs: 7.3 K/uL (ref 1.7–7.7)
Neutrophils Relative %: 74 %
Platelet Count: 331 K/uL (ref 150–400)
RBC: 4.67 MIL/uL (ref 4.22–5.81)
RDW: 14.6 % (ref 11.5–15.5)
WBC Count: 9.8 K/uL (ref 4.0–10.5)
nRBC: 0 % (ref 0.0–0.2)

## 2024-08-06 LAB — CMP (CANCER CENTER ONLY)
ALT: 24 U/L (ref 0–44)
AST: 16 U/L (ref 15–41)
Albumin: 4.6 g/dL (ref 3.5–5.0)
Alkaline Phosphatase: 69 U/L (ref 38–126)
Anion gap: 15 (ref 5–15)
BUN: <5 mg/dL — ABNORMAL LOW (ref 6–20)
CO2: 24 mmol/L (ref 22–32)
Calcium: 9.4 mg/dL (ref 8.9–10.3)
Chloride: 94 mmol/L — ABNORMAL LOW (ref 98–111)
Creatinine: 0.58 mg/dL — ABNORMAL LOW (ref 0.61–1.24)
GFR, Estimated: >60 mL/min
Glucose, Bld: 90 mg/dL (ref 70–99)
Potassium: 4.6 mmol/L (ref 3.5–5.1)
Sodium: 133 mmol/L — ABNORMAL LOW (ref 135–145)
Total Bilirubin: 0.2 mg/dL (ref 0.0–1.2)
Total Protein: 7.9 g/dL (ref 6.5–8.1)

## 2024-08-06 LAB — IRON AND TIBC
Iron: 42 ug/dL — ABNORMAL LOW (ref 45–182)
Saturation Ratios: 13 % — ABNORMAL LOW (ref 17.9–39.5)
TIBC: 332 ug/dL (ref 250–450)
UIBC: 290 ug/dL

## 2024-08-06 LAB — FERRITIN: Ferritin: 56 ng/mL (ref 24–336)

## 2024-08-06 LAB — VITAMIN B12: Vitamin B-12: 1072 pg/mL — ABNORMAL HIGH (ref 180–914)
